# Patient Record
Sex: Male | Born: 1940 | Race: White | Hispanic: No | Smoking: Former smoker
Health system: Southern US, Community
[De-identification: ages and names within clinical notes are randomized; demographics above are authoritative.]

## PROBLEM LIST (undated history)

## (undated) DIAGNOSIS — I509 Heart failure, unspecified: Secondary | ICD-10-CM

## (undated) DIAGNOSIS — E785 Hyperlipidemia, unspecified: Secondary | ICD-10-CM

## (undated) DIAGNOSIS — E119 Type 2 diabetes mellitus without complications: Secondary | ICD-10-CM

## (undated) DIAGNOSIS — E538 Deficiency of other specified B group vitamins: Secondary | ICD-10-CM

## (undated) DIAGNOSIS — Z8546 Personal history of malignant neoplasm of prostate: Secondary | ICD-10-CM

## (undated) DIAGNOSIS — C9110 Chronic lymphocytic leukemia of B-cell type not having achieved remission: Secondary | ICD-10-CM

## (undated) DIAGNOSIS — D72829 Elevated white blood cell count, unspecified: Secondary | ICD-10-CM

## (undated) DIAGNOSIS — I10 Essential (primary) hypertension: Secondary | ICD-10-CM

## (undated) HISTORY — DX: Type 2 diabetes mellitus without complications: E11.9

## (undated) HISTORY — DX: Hyperlipidemia, unspecified: E78.5

## (undated) HISTORY — DX: Heart failure, unspecified: I50.9

## (undated) HISTORY — DX: Chronic lymphocytic leukemia of B-cell type not having achieved remission: C91.10

## (undated) HISTORY — DX: Elevated white blood cell count, unspecified: D72.829

## (undated) HISTORY — DX: Deficiency of other specified B group vitamins: E53.8

## (undated) HISTORY — DX: Personal history of malignant neoplasm of prostate: Z85.46

## (undated) HISTORY — PX: PROSTATE BIOPSY: SHX241

## (undated) HISTORY — DX: Essential (primary) hypertension: I10

## (undated) MED FILL — Cemiplimab-rwlc IV Soln 350 MG/7ML (50 MG/ML): INTRAVENOUS | Qty: 7 | Status: AC

---

## 1998-02-20 ENCOUNTER — Encounter: Payer: Self-pay | Admitting: Emergency Medicine

## 1998-02-20 ENCOUNTER — Inpatient Hospital Stay (HOSPITAL_COMMUNITY): Admission: EM | Admit: 1998-02-20 | Discharge: 1998-02-23 | Payer: Self-pay | Admitting: Emergency Medicine

## 1998-02-22 ENCOUNTER — Encounter: Payer: Self-pay | Admitting: Family Medicine

## 1998-02-24 ENCOUNTER — Encounter: Admission: RE | Admit: 1998-02-24 | Discharge: 1998-02-24 | Payer: Self-pay | Admitting: Family Medicine

## 1998-03-04 ENCOUNTER — Encounter: Admission: RE | Admit: 1998-03-04 | Discharge: 1998-06-02 | Payer: Self-pay | Admitting: *Deleted

## 2003-05-05 ENCOUNTER — Ambulatory Visit: Admission: RE | Admit: 2003-05-05 | Discharge: 2003-08-03 | Payer: Self-pay | Admitting: *Deleted

## 2003-06-09 ENCOUNTER — Encounter: Admission: RE | Admit: 2003-06-09 | Discharge: 2003-06-09 | Payer: Self-pay | Admitting: Urology

## 2003-06-24 ENCOUNTER — Encounter: Admission: RE | Admit: 2003-06-24 | Discharge: 2003-06-24 | Payer: Self-pay | Admitting: Urology

## 2003-07-01 ENCOUNTER — Ambulatory Visit (HOSPITAL_COMMUNITY): Admission: RE | Admit: 2003-07-01 | Discharge: 2003-07-01 | Payer: Self-pay | Admitting: Urology

## 2003-07-01 ENCOUNTER — Ambulatory Visit (HOSPITAL_BASED_OUTPATIENT_CLINIC_OR_DEPARTMENT_OTHER): Admission: RE | Admit: 2003-07-01 | Discharge: 2003-07-01 | Payer: Self-pay | Admitting: Urology

## 2005-06-11 IMAGING — CR DG CHEST 2V
2 series · 2 of 2 positions shown · non-contrast
Comparison: none

CLINICAL DATA: Carcinoma of the prostate, evaluate.
 TWO VIEW CHEST
 There are mildly accentuated bronchial markings consistent with mild probable chronic bronchitic change.  There are no infiltrative or edematous changes.  There is questionable small nodular shadow seen within the right lung base in the anterior right fifth/sixth interspace region.  Recommend PA chest with nipple markers for further evaluation.  There are no mediastinal abnormalities.  There are multiple left rib fractures.  The heart is normal in size.
 IMPRESSION
 1.  Question small nodular shadow, right lung base vs. nipple shadow.  Recommend PA chest with nipple markers. 
 2. Probable mild chronic bronchitic change.

[view not recorded (1 of 2)]
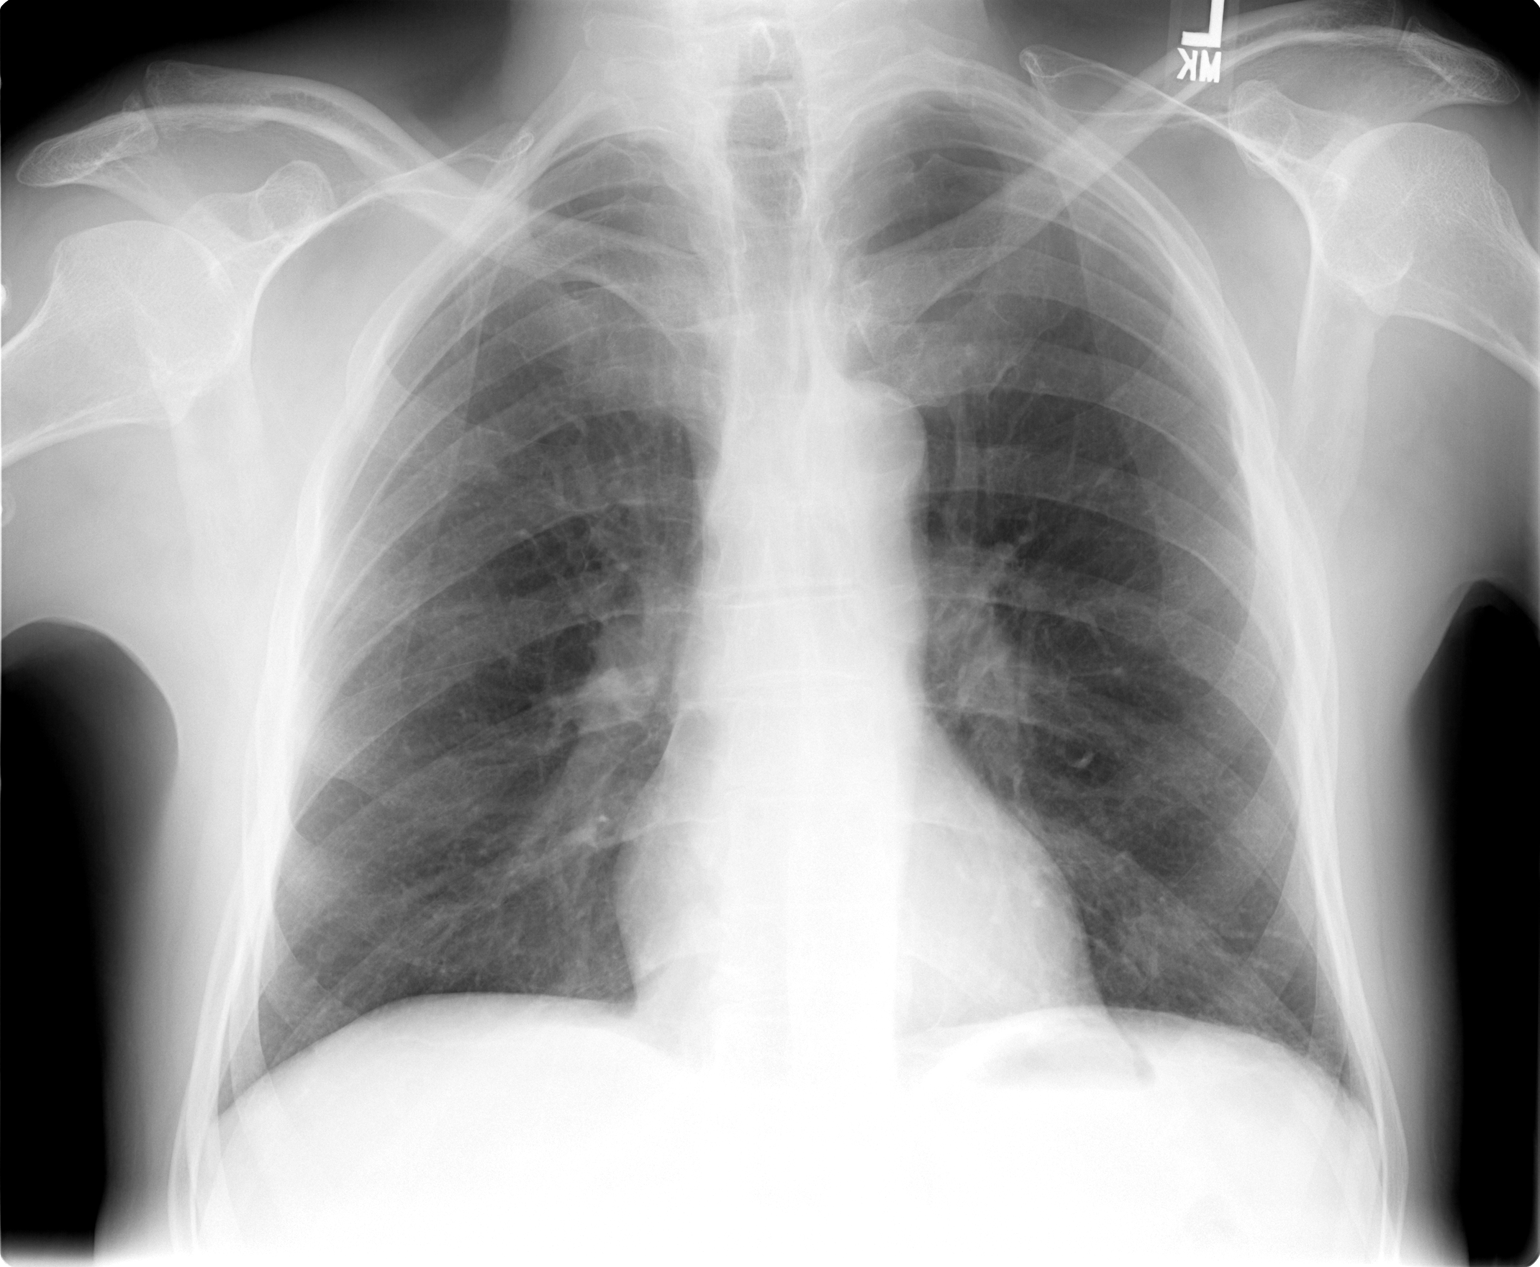

[view not recorded (2 of 2)]
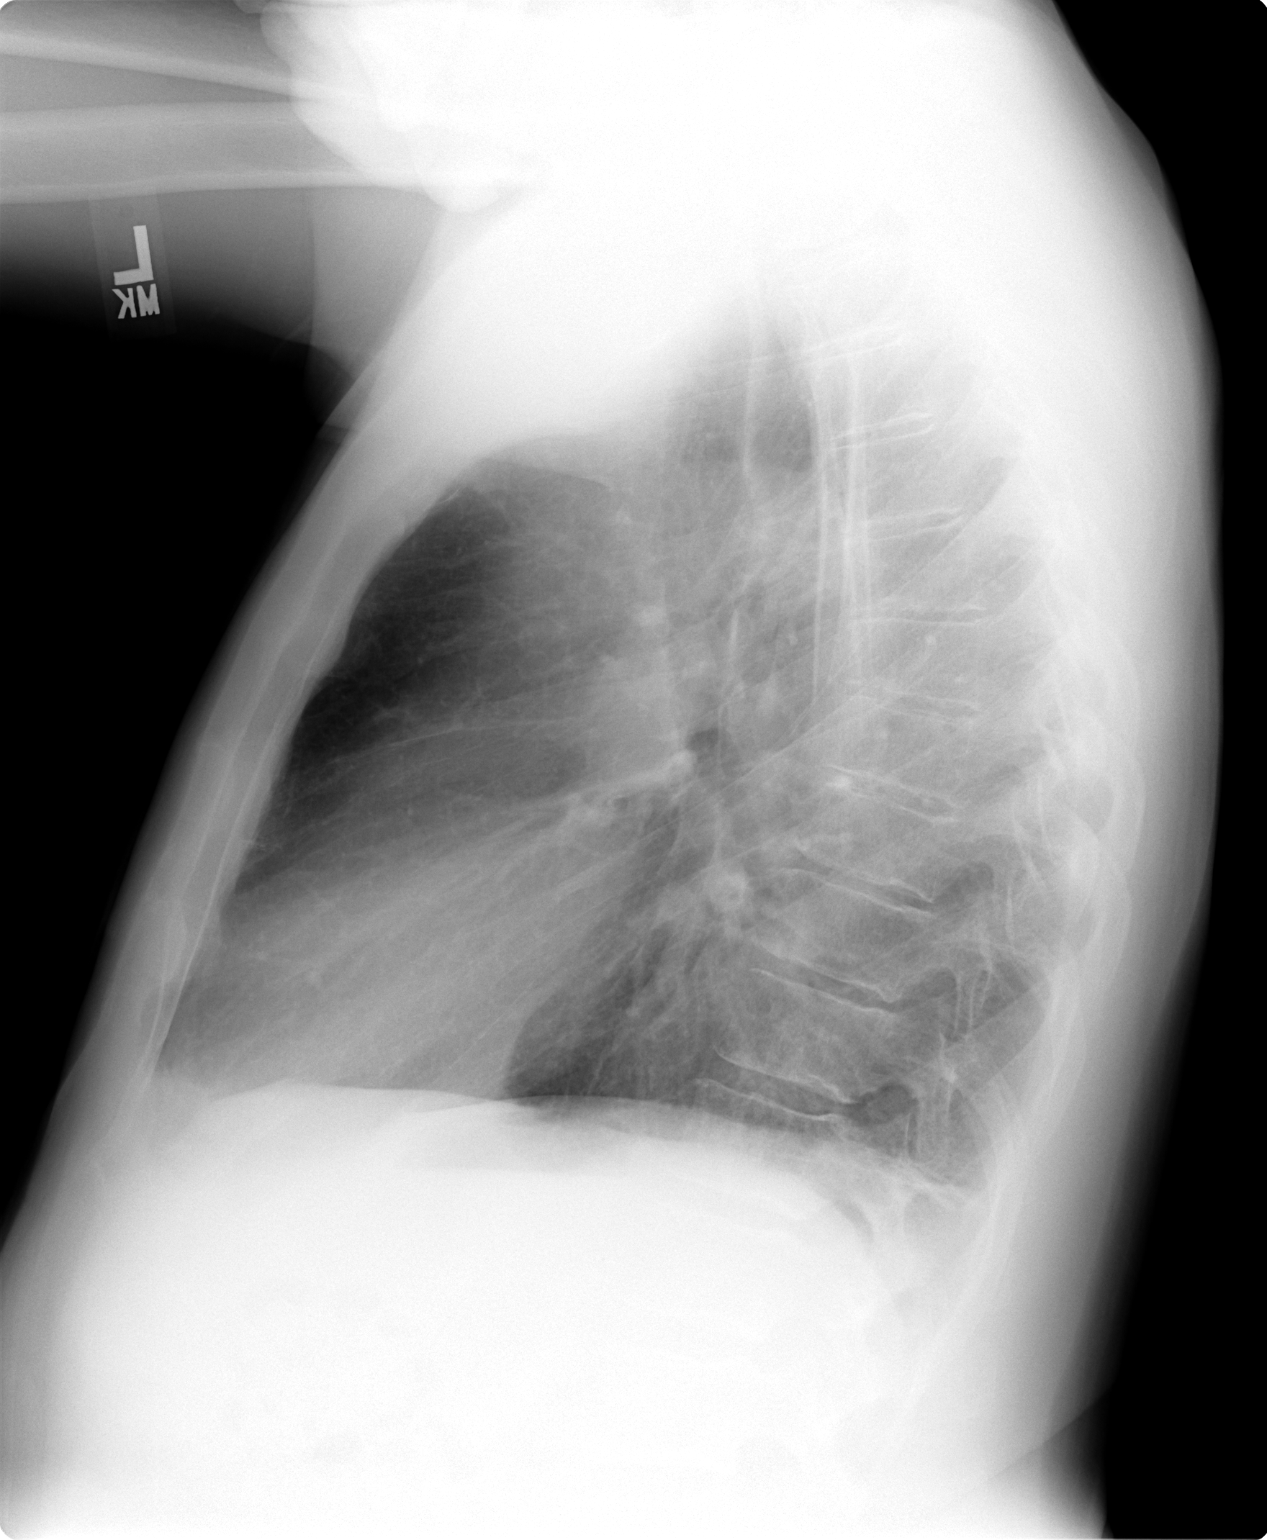

[2 of 2 positions shown; findings below may reference images not displayed]

## 2011-06-20 DIAGNOSIS — D126 Benign neoplasm of colon, unspecified: Secondary | ICD-10-CM | POA: Insufficient documentation

## 2012-12-27 DIAGNOSIS — I10 Essential (primary) hypertension: Secondary | ICD-10-CM | POA: Insufficient documentation

## 2012-12-27 DIAGNOSIS — Z86010 Personal history of colonic polyps: Secondary | ICD-10-CM | POA: Insufficient documentation

## 2012-12-27 DIAGNOSIS — K5730 Diverticulosis of large intestine without perforation or abscess without bleeding: Secondary | ICD-10-CM | POA: Insufficient documentation

## 2013-01-04 DIAGNOSIS — C61 Malignant neoplasm of prostate: Secondary | ICD-10-CM | POA: Insufficient documentation

## 2013-01-10 DIAGNOSIS — E785 Hyperlipidemia, unspecified: Secondary | ICD-10-CM | POA: Insufficient documentation

## 2013-01-10 DIAGNOSIS — E1169 Type 2 diabetes mellitus with other specified complication: Secondary | ICD-10-CM | POA: Insufficient documentation

## 2013-01-10 DIAGNOSIS — E538 Deficiency of other specified B group vitamins: Secondary | ICD-10-CM | POA: Insufficient documentation

## 2013-07-17 DIAGNOSIS — B351 Tinea unguium: Secondary | ICD-10-CM | POA: Insufficient documentation

## 2013-12-25 DIAGNOSIS — I509 Heart failure, unspecified: Secondary | ICD-10-CM | POA: Insufficient documentation

## 2014-01-24 DIAGNOSIS — I471 Supraventricular tachycardia: Secondary | ICD-10-CM | POA: Insufficient documentation

## 2014-01-24 DIAGNOSIS — I493 Ventricular premature depolarization: Secondary | ICD-10-CM | POA: Insufficient documentation

## 2014-04-30 DIAGNOSIS — G2581 Restless legs syndrome: Secondary | ICD-10-CM | POA: Insufficient documentation

## 2015-03-20 DIAGNOSIS — I779 Disorder of arteries and arterioles, unspecified: Secondary | ICD-10-CM | POA: Insufficient documentation

## 2015-05-25 ENCOUNTER — Other Ambulatory Visit (HOSPITAL_COMMUNITY)
Admission: RE | Admit: 2015-05-25 | Disposition: A | Discharge status: Home / Self Care | Payer: Self-pay | Source: Ambulatory Visit | Attending: Oncology | Admitting: Oncology

## 2015-05-25 DIAGNOSIS — D649 Anemia, unspecified: Secondary | ICD-10-CM | POA: Diagnosis not present

## 2015-05-25 DIAGNOSIS — Z8546 Personal history of malignant neoplasm of prostate: Secondary | ICD-10-CM | POA: Diagnosis not present

## 2015-05-25 DIAGNOSIS — C9110 Chronic lymphocytic leukemia of B-cell type not having achieved remission: Secondary | ICD-10-CM | POA: Insufficient documentation

## 2015-07-20 DIAGNOSIS — Z9181 History of falling: Secondary | ICD-10-CM | POA: Insufficient documentation

## 2015-11-23 DIAGNOSIS — C9110 Chronic lymphocytic leukemia of B-cell type not having achieved remission: Secondary | ICD-10-CM | POA: Diagnosis not present

## 2015-12-24 DIAGNOSIS — Z85828 Personal history of other malignant neoplasm of skin: Secondary | ICD-10-CM | POA: Insufficient documentation

## 2016-12-21 DIAGNOSIS — D849 Immunodeficiency, unspecified: Secondary | ICD-10-CM | POA: Diagnosis not present

## 2016-12-21 DIAGNOSIS — C9110 Chronic lymphocytic leukemia of B-cell type not having achieved remission: Secondary | ICD-10-CM | POA: Diagnosis not present

## 2016-12-21 DIAGNOSIS — B370 Candidal stomatitis: Secondary | ICD-10-CM | POA: Diagnosis not present

## 2016-12-28 DIAGNOSIS — C44310 Basal cell carcinoma of skin of unspecified parts of face: Secondary | ICD-10-CM | POA: Insufficient documentation

## 2017-12-05 DIAGNOSIS — C9110 Chronic lymphocytic leukemia of B-cell type not having achieved remission: Secondary | ICD-10-CM

## 2018-05-17 DIAGNOSIS — M5440 Lumbago with sciatica, unspecified side: Secondary | ICD-10-CM | POA: Insufficient documentation

## 2018-06-07 DIAGNOSIS — C9110 Chronic lymphocytic leukemia of B-cell type not having achieved remission: Secondary | ICD-10-CM | POA: Diagnosis not present

## 2019-01-03 DIAGNOSIS — C9110 Chronic lymphocytic leukemia of B-cell type not having achieved remission: Secondary | ICD-10-CM | POA: Diagnosis not present

## 2019-01-03 DIAGNOSIS — D72829 Elevated white blood cell count, unspecified: Secondary | ICD-10-CM | POA: Diagnosis not present

## 2019-03-11 DIAGNOSIS — C9110 Chronic lymphocytic leukemia of B-cell type not having achieved remission: Secondary | ICD-10-CM

## 2019-05-16 DIAGNOSIS — D7289 Other specified disorders of white blood cells: Secondary | ICD-10-CM

## 2019-08-12 DIAGNOSIS — I480 Paroxysmal atrial fibrillation: Secondary | ICD-10-CM | POA: Insufficient documentation

## 2019-08-14 DIAGNOSIS — C9110 Chronic lymphocytic leukemia of B-cell type not having achieved remission: Secondary | ICD-10-CM | POA: Diagnosis not present

## 2019-08-24 DIAGNOSIS — K56609 Unspecified intestinal obstruction, unspecified as to partial versus complete obstruction: Secondary | ICD-10-CM | POA: Insufficient documentation

## 2019-08-24 DIAGNOSIS — K5000 Crohn's disease of small intestine without complications: Secondary | ICD-10-CM | POA: Insufficient documentation

## 2019-08-28 DIAGNOSIS — R5381 Other malaise: Secondary | ICD-10-CM | POA: Insufficient documentation

## 2019-10-18 DIAGNOSIS — D72829 Elevated white blood cell count, unspecified: Secondary | ICD-10-CM

## 2020-02-13 DIAGNOSIS — C9110 Chronic lymphocytic leukemia of B-cell type not having achieved remission: Secondary | ICD-10-CM | POA: Diagnosis not present

## 2020-02-13 DIAGNOSIS — D72829 Elevated white blood cell count, unspecified: Secondary | ICD-10-CM | POA: Diagnosis not present

## 2020-02-20 ENCOUNTER — Encounter: Payer: Self-pay | Admitting: Oncology

## 2020-02-20 ENCOUNTER — Other Ambulatory Visit: Payer: Self-pay | Admitting: Oncology

## 2020-02-20 DIAGNOSIS — D509 Iron deficiency anemia, unspecified: Secondary | ICD-10-CM

## 2020-02-20 HISTORY — DX: Iron deficiency anemia, unspecified: D50.9

## 2020-02-21 ENCOUNTER — Encounter: Payer: Self-pay | Admitting: Pharmacist

## 2020-02-23 ENCOUNTER — Other Ambulatory Visit: Payer: Self-pay | Admitting: Oncology

## 2020-03-16 DIAGNOSIS — E039 Hypothyroidism, unspecified: Secondary | ICD-10-CM | POA: Insufficient documentation

## 2020-03-24 ENCOUNTER — Other Ambulatory Visit: Payer: Self-pay | Admitting: Hematology and Oncology

## 2020-03-24 ENCOUNTER — Telehealth: Payer: Self-pay | Admitting: Oncology

## 2020-03-24 DIAGNOSIS — C9110 Chronic lymphocytic leukemia of B-cell type not having achieved remission: Secondary | ICD-10-CM

## 2020-03-24 MED FILL — Ferumoxytol Inj 510 MG/17ML (30 MG/ML) (Elemental Fe): INTRAVENOUS | Qty: 17 | Status: AC

## 2020-03-24 NOTE — Telephone Encounter (Signed)
Patient's daughter Reschedule 11/29 Infusion to 12/2 due to patient having another appointment

## 2020-03-25 ENCOUNTER — Inpatient Hospital Stay: Payer: Medicare Other

## 2020-03-25 ENCOUNTER — Telehealth: Payer: Self-pay

## 2020-03-30 ENCOUNTER — Ambulatory Visit: Payer: Medicare Other

## 2020-04-02 ENCOUNTER — Inpatient Hospital Stay: Payer: Medicare Other | Attending: Oncology

## 2020-04-02 ENCOUNTER — Other Ambulatory Visit: Payer: Self-pay

## 2020-04-02 VITALS — BP 128/79 | HR 65 | Temp 97.8°F | Resp 18 | Ht 70.5 in | Wt 166.2 lb

## 2020-04-02 DIAGNOSIS — D509 Iron deficiency anemia, unspecified: Secondary | ICD-10-CM | POA: Insufficient documentation

## 2020-04-02 MED ORDER — SODIUM CHLORIDE 0.9 % IV SOLN
Freq: Once | INTRAVENOUS | Status: AC
  Filled 2020-04-02: qty 250

## 2020-04-02 MED ORDER — SODIUM CHLORIDE 0.9 % IV SOLN
510.0000 mg | Freq: Once | INTRAVENOUS | Status: AC
  Administered 2020-04-02: 510 mg via INTRAVENOUS
  Filled 2020-04-02: qty 510

## 2020-04-02 NOTE — Progress Notes (Signed)
1414:PT STABLE AT TIME OF DISCHARGE °

## 2020-04-02 NOTE — Patient Instructions (Signed)

## 2020-04-07 ENCOUNTER — Other Ambulatory Visit: Payer: Self-pay | Admitting: Pharmacist

## 2020-04-08 ENCOUNTER — Ambulatory Visit: Payer: Medicare Other

## 2020-04-09 ENCOUNTER — Other Ambulatory Visit: Payer: Self-pay

## 2020-04-09 ENCOUNTER — Other Ambulatory Visit: Payer: Medicare Other

## 2020-04-09 ENCOUNTER — Inpatient Hospital Stay: Payer: Medicare Other

## 2020-04-09 ENCOUNTER — Ambulatory Visit: Payer: Medicare Other | Admitting: Oncology

## 2020-04-09 VITALS — BP 133/68 | HR 58 | Temp 97.7°F | Resp 18 | Ht 70.5 in | Wt 166.2 lb

## 2020-04-09 DIAGNOSIS — D509 Iron deficiency anemia, unspecified: Secondary | ICD-10-CM | POA: Diagnosis not present

## 2020-04-09 MED ORDER — SODIUM CHLORIDE 0.9 % IV SOLN
510.0000 mg | Freq: Once | INTRAVENOUS | Status: AC
  Administered 2020-04-09: 510 mg via INTRAVENOUS
  Filled 2020-04-09: qty 510

## 2020-04-09 MED ORDER — SODIUM CHLORIDE 0.9 % IV SOLN
Freq: Once | INTRAVENOUS | Status: AC
  Filled 2020-04-09: qty 250

## 2020-04-09 NOTE — Progress Notes (Signed)
PT STABLE AT TIME OF DISCHARGE 

## 2020-04-09 NOTE — Patient Instructions (Signed)

## 2020-04-10 ENCOUNTER — Other Ambulatory Visit: Payer: Self-pay | Admitting: Hematology and Oncology

## 2020-04-10 DIAGNOSIS — C9110 Chronic lymphocytic leukemia of B-cell type not having achieved remission: Secondary | ICD-10-CM

## 2020-04-16 ENCOUNTER — Other Ambulatory Visit: Payer: Medicare Other

## 2020-04-16 ENCOUNTER — Ambulatory Visit: Payer: Medicare Other | Admitting: Oncology

## 2020-05-02 DEATH — deceased

## 2020-05-04 NOTE — Progress Notes (Signed)
[facility]  [address] [place],  [place]  [address] [phone]  Clinic Day:  05/05/2020  Referring physician: [name], MD   This document serves as a record of services personally performed by Gery Pray, MD. It was created on their behalf by [name],[name], a trained medical scribe. The creation of this record is based on the scribe's personal observations and the provider's statements to them.  CHIEF COMPLAINT:  CC: Chronic lymphocytic leukemia  Current Treatment:  Surveillance   HISTORY OF PRESENT ILLNESS:  Fernando Young is a 80 y.o. 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  The CLL was confirmed by flow cytometry in July 2017.  He has had slow progression of his lymphocytosis over time, with mild anemia, but has remained on observation.  He has multiple medical comorbidities including diabetes, congestive heart failure, B12 deficiency, cardiac arrhythmias, and history of prostate cancer.  The white blood cell count increased significantly from 36,000 to 57,000 from February to August 2019, but since has been increasing more slowly.  His white count had increased to 77.5 in June of 2020, with 82% lymphocytes.  He states he had a colonoscopy in [place] last year.  He was seen in consultation at [facility] in August of 2020, and they recommend treatment in view of his anemia and more rapid rise in his lymphocytosis.  His white blood cell count was up to 88,000 with 79% lymphocytes, for an ALC of 70,000, hemoglobin 11 and platelet count of 199,000.  He and his daughter discussed that with me at our recent appointment, and we reviewed the risks and benefits, and have decided to wait a little longer. He underwent MOHS Surgery of his right face for a skin cancer. He was diagnosed with atrial fibrillation, and was scheduled with Dr. Lucianne Muss for cardioversion.  He  continues oral iron supplement and B12. At his last last visit in October he was found to have iron deficiency, but B12 and folate levels were normal.  He was given 2 doses of IV Feraheme.    INTERVAL HISTORY:  Fernando Young is here for routine follow up and states that he has been well.  At his last visit, he was found to be iron deficient, and he received two treatments of IV iron in October 2021.  He does continue oral iron supplement, but I doubt that he is absorbing the supplement, so he may discontinue.  He does note some fatigue.  He is scheduled with dermatology in the near future for his multiple skin lesions.  He has been placed on levothyroxine 50 mcg daily.  His white count has improved from 116.2 to 101.6 with an ALC of 91.44, improved from 111.55.  Hemoglobin has improved from 9.3 to 11.2, and his platelet count is normal.  Chemistries are remarkable for a potassium of 5.8, a BUN of 21, and a total protein of 5.9.  Non-fasting blood glucose is 163.  His  appetite is good, and he has lost 1 and 1/2 pounds since his last visit.  He denies fever, chills or other signs of infection.  He denies nausea, vomiting, bowel issues, or abdominal pain.  He denies sore throat, cough, dyspnea, or chest pain.  REVIEW OF SYSTEMS:  Review of Systems  Constitutional: Positive for fatigue (improved).  HENT:  Negative.   Eyes: Negative.   Respiratory: Negative.   Cardiovascular: Negative.   Gastrointestinal:  Negative.   Endocrine: Negative.   Genitourinary: Negative.    Musculoskeletal: Negative.   Skin: Negative.   Neurological: Negative.   Hematological: Negative.   Psychiatric/Behavioral: Negative.   All other systems reviewed and are negative.    VITALS:  Blood pressure (!) 146/69, pulse 65, temperature (!) 97.4 F (36.3 C), temperature source Oral, resp. rate 18, height 5' 10.5" (1.791 m), weight 164 lb 9.6 oz (74.7 kg), SpO2 97 %.  Wt Readings from Last 3 Encounters:  05/05/20 164 lb 9.6 oz (74.7  kg)  04/09/20 166 lb 4 oz (75.4 kg)  04/02/20 166 lb 4 oz (75.4 kg)    Body mass index is 23.28 kg/m.  Performance status (ECOG): 1 - Symptomatic but completely ambulatory  PHYSICAL EXAM:  Physical Exam Constitutional:      General: He is not in acute distress.    Appearance: Normal appearance. He is normal weight.  HENT:     Head: Normocephalic and atraumatic.  Eyes:     General: No scleral icterus.    Extraocular Movements: Extraocular movements intact.     Conjunctiva/sclera: Conjunctivae normal.     Pupils: Pupils are equal, round, and reactive to light.     Comments: Ectropion of the right eye.  Cardiovascular:     Rate and Rhythm: Normal rate and regular rhythm.     Pulses: Normal pulses.     Heart sounds: Normal heart sounds. No murmur heard. No friction rub. No gallop.   Pulmonary:     Effort: Pulmonary effort is normal. No respiratory distress.     Breath sounds: Normal breath sounds.  Abdominal:     General: Bowel sounds are normal. There is no distension.     Palpations: Abdomen is soft. There is no mass.     Tenderness: There is no abdominal tenderness.  Musculoskeletal:        General: Normal range of motion.     Cervical back: Normal range of motion and neck supple.     Right lower leg: Edema (trace) present.     Left lower leg: Edema (trace) present.  Lymphadenopathy:     Cervical: No cervical adenopathy.  Skin:    General: Skin is warm and dry.     Comments: Lesion at the temporal mandibular joint.  Growth of the right posterior neck, consistent with skin tag.  Multiple keratoses with a larger lesion of the left cheek and of the nose.    Neurological:     General: No focal deficit present.     Mental Status: He is alert and oriented to person, place, and time. Mental status is at baseline.  Psychiatric:        Mood and Affect: Mood normal.        Behavior: Behavior normal.        Thought Content: Thought content normal.        Judgment: Judgment  normal.     LABS:  No flowsheet data found. No flowsheet data found.   No results found for: LDH   STUDIES:  No results found.   Allergies: No Known Allergies  Current Medications: Current Outpatient Medications  Medication Sig Dispense Refill  . amiodarone (PACERONE) 200 MG tablet Take 1 tablet by mouth daily.    Marland Kitchen albuterol (VENTOLIN HFA) 108 (90 Base) MCG/ACT inhaler Inhale 2 puffs into the lungs every 4 (four) hours as needed for wheezing or shortness of breath.    . Apixaban (ELIQUIS PO) Take by mouth daily.    Marland Kitchen  Ascorbic Acid (VITAMIN C) 500 MG CAPS Take 500 mg by mouth daily.    Marland Kitchen azelastine (ASTELIN) 0.1 % nasal spray Place into both nostrils 2 (two) times daily. Use in each nostril as directed    . benazepril (LOTENSIN) 20 MG tablet Take 20 mg by mouth daily.    Marland Kitchen doxycycline (DORYX) 100 MG EC tablet Take 100 mg by mouth 2 (two) times daily.    . ferrous sulfate 325 (65 FE) MG tablet Take by mouth.    . fluorouracil (EFUDEX) 5 % cream Apply topically 2 (two) times daily.    . hydrochlorothiazide (MICROZIDE) 12.5 MG capsule Take 12.5 mg by mouth daily.    Marland Kitchen levothyroxine (SYNTHROID) 50 MCG tablet Take 50 mcg by mouth daily.    . metFORMIN (GLUCOPHAGE) 500 MG tablet Take by mouth 2 (two) times daily with a meal.    . metoprolol succinate (TOPROL-XL) 25 MG 24 hr tablet Take 25 mg by mouth daily.    . minocycline (MINOCIN) 100 MG capsule Take 100 mg by mouth 2 (two) times daily.    . Omega-3 Fatty Acids (FISH OIL) 1000 MG CAPS Take 2,000 mg by mouth daily.    Marland Kitchen omeprazole (PRILOSEC OTC) 20 MG tablet Take 20 mg by mouth daily.    Marland Kitchen rOPINIRole (REQUIP) 0.5 MG tablet Take 0.5 mg by mouth at bedtime. Take 1-2 before sleep    . rosuvastatin (CRESTOR) 10 MG tablet Take 10 mg by mouth daily.    . tamsulosin (FLOMAX) 0.4 MG CAPS capsule Take 0.4 mg by mouth at bedtime.    . traMADol (ULTRAM) 50 MG tablet SMARTSIG:1 By Mouth 4-5 Times Daily    . vitamin B-12 (CYANOCOBALAMIN) 500  MCG tablet Take 500 mcg by mouth daily.     No current facility-administered medications for this visit.     ASSESSMENT & PLAN:   Assessment:   1.  Chronic lymphocytic leukemia, diagnosed in July 2017, which is slowly progressive.  As he is fairly stable, it is not urgent that he start treatment at this time.  I would recommend acalabrutinib if he needs to start treatment.   2.  Anemia, much improved.  He was found to be iron deficiency in October 2021, and received 2 doses of IV iron.  He had been on oral supplement for years, and so I doubt he is absorbing this and may discontinue.    3.  Hyperkalemia.  I have given them a note to take to Dr. Heber [place] so that a level can be repeated next week.  Plan: His white count is improved at 101.6 with and ALC of 91.44, so we will hold off on treatment.  We will see him back in 3 months with CBC and CMP.  I gave them a note to take to Dr. Heber [place] to repeat a potassium level next week.  The patient and his daughter understand the plans discussed today and are in agreement with them.  He knows to contact our office if he develops concerns regarding his CLL.    I provided 20 minutes of face-to-face time during this this encounter and > 50% was spent counseling as documented under my assessment and plan.    Derwood Kaplan, MD [place] [address] [place] [address] Dept: [phone] Dept Fax: [phone]   I, Rita Ohara, am acting as scribe for Derwood Kaplan, MD  I have reviewed this report as typed  by the medical scribe, and it is complete and accurate.

## 2020-05-05 ENCOUNTER — Other Ambulatory Visit: Payer: Self-pay

## 2020-05-05 ENCOUNTER — Telehealth: Payer: Self-pay | Admitting: Oncology

## 2020-05-05 ENCOUNTER — Encounter: Payer: Self-pay | Admitting: Oncology

## 2020-05-05 ENCOUNTER — Inpatient Hospital Stay: Payer: Medicare Other | Attending: Oncology

## 2020-05-05 ENCOUNTER — Other Ambulatory Visit: Payer: Self-pay | Admitting: Hematology and Oncology

## 2020-05-05 ENCOUNTER — Inpatient Hospital Stay (INDEPENDENT_AMBULATORY_CARE_PROVIDER_SITE_OTHER): Payer: Medicare Other | Admitting: Oncology

## 2020-05-05 VITALS — BP 146/69 | HR 65 | Temp 97.4°F | Resp 18 | Ht 70.5 in | Wt 164.6 lb

## 2020-05-05 DIAGNOSIS — C9110 Chronic lymphocytic leukemia of B-cell type not having achieved remission: Secondary | ICD-10-CM | POA: Insufficient documentation

## 2020-05-05 LAB — RETICULOCYTES
Immature Retic Fract: 16.5 % — ABNORMAL HIGH (ref 2.3–15.9)
RBC.: 4.12 MIL/uL — ABNORMAL LOW (ref 4.22–5.81)
Retic Count, Absolute: 57.3 10*3/uL (ref 19.0–186.0)
Retic Ct Pct: 1.4 % (ref 0.4–3.1)

## 2020-05-05 LAB — HEPATIC FUNCTION PANEL
ALT: 42 — AB (ref 10–40)
AST: 58 — AB (ref 14–40)
Alkaline Phosphatase: 59 (ref 25–125)
Bilirubin, Total: 0.3

## 2020-05-05 LAB — BASIC METABOLIC PANEL
BUN: 21 (ref 4–21)
CO2: 25 — AB (ref 13–22)
Chloride: 108 (ref 99–108)
Creatinine: 0.9 (ref 0.6–1.3)
Glucose: 163
Potassium: 5.8 — AB (ref 3.4–5.3)
Sodium: 138 (ref 137–147)

## 2020-05-05 LAB — COMPREHENSIVE METABOLIC PANEL
Albumin: 3.3 — AB (ref 3.5–5.0)
Calcium: 8.4 — AB (ref 8.7–10.7)

## 2020-05-05 LAB — CBC AND DIFFERENTIAL
HCT: 37 — AB (ref 41–53)
Hemoglobin: 11.2 — AB (ref 13.5–17.5)
Neutrophils Absolute: 8.13
Platelets: 185 (ref 150–399)
WBC: 101.6

## 2020-05-05 LAB — CBC: RBC: 4.14 (ref 3.87–5.11)

## 2020-05-05 NOTE — Telephone Encounter (Signed)
Per 1/4 los next appt sched and given to patient

## 2020-05-06 LAB — HAPTOGLOBIN: Haptoglobin: 265 mg/dL (ref 38–329)

## 2020-05-06 LAB — MYCOPLASMA PNEUMONIAE ANTIBODY, IGG: M Pneumoniae IgG Abs: 160 U/mL — ABNORMAL HIGH (ref 0–99)

## 2020-05-06 LAB — MYCOPLASMA PNEUMONIAE ANTIBODY, IGM: Mycoplasma pneumo IgM: <770 U/mL (ref 0–769)

## 2020-05-06 LAB — COLD AGGLUTININ TITER: Cold Agglutinin Titer: NEGATIVE

## 2020-05-18 ENCOUNTER — Telehealth: Payer: Self-pay

## 2020-05-18 NOTE — Telephone Encounter (Signed)
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  No cold agglutinins now, but could have been present when he had the infection.  

## 2020-05-18 NOTE — Telephone Encounter (Signed)
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  No cold agglutinins now, but could have been present when he had the infection.

## 2020-05-18 NOTE — Telephone Encounter (Signed)
[name] the daughter called and make aware.

## 2020-05-25 DIAGNOSIS — U071 COVID-19: Secondary | ICD-10-CM | POA: Insufficient documentation

## 2020-08-03 ENCOUNTER — Other Ambulatory Visit: Payer: Self-pay

## 2020-08-03 ENCOUNTER — Inpatient Hospital Stay: Payer: Medicare Other

## 2020-08-03 ENCOUNTER — Telehealth: Payer: Self-pay

## 2020-08-03 ENCOUNTER — Other Ambulatory Visit: Payer: Self-pay | Admitting: Hematology and Oncology

## 2020-08-03 ENCOUNTER — Inpatient Hospital Stay: Payer: Medicare Other | Attending: Oncology | Admitting: Hematology and Oncology

## 2020-08-03 VITALS — BP 118/64 | HR 63 | Temp 97.7°F | Resp 18 | Ht 70.5 in | Wt 157.2 lb

## 2020-08-03 DIAGNOSIS — R2689 Other abnormalities of gait and mobility: Secondary | ICD-10-CM | POA: Diagnosis not present

## 2020-08-03 DIAGNOSIS — E119 Type 2 diabetes mellitus without complications: Secondary | ICD-10-CM | POA: Insufficient documentation

## 2020-08-03 DIAGNOSIS — C9110 Chronic lymphocytic leukemia of B-cell type not having achieved remission: Secondary | ICD-10-CM | POA: Diagnosis not present

## 2020-08-03 DIAGNOSIS — Z9181 History of falling: Secondary | ICD-10-CM | POA: Diagnosis not present

## 2020-08-03 DIAGNOSIS — E785 Hyperlipidemia, unspecified: Secondary | ICD-10-CM | POA: Diagnosis not present

## 2020-08-03 DIAGNOSIS — I509 Heart failure, unspecified: Secondary | ICD-10-CM | POA: Insufficient documentation

## 2020-08-03 DIAGNOSIS — I4891 Unspecified atrial fibrillation: Secondary | ICD-10-CM | POA: Diagnosis not present

## 2020-08-03 DIAGNOSIS — Z79899 Other long term (current) drug therapy: Secondary | ICD-10-CM | POA: Insufficient documentation

## 2020-08-03 DIAGNOSIS — E860 Dehydration: Secondary | ICD-10-CM | POA: Insufficient documentation

## 2020-08-03 DIAGNOSIS — I110 Hypertensive heart disease with heart failure: Secondary | ICD-10-CM | POA: Diagnosis not present

## 2020-08-03 DIAGNOSIS — Z8546 Personal history of malignant neoplasm of prostate: Secondary | ICD-10-CM | POA: Diagnosis not present

## 2020-08-03 DIAGNOSIS — E538 Deficiency of other specified B group vitamins: Secondary | ICD-10-CM | POA: Diagnosis not present

## 2020-08-03 DIAGNOSIS — Z7901 Long term (current) use of anticoagulants: Secondary | ICD-10-CM | POA: Insufficient documentation

## 2020-08-03 DIAGNOSIS — Z7984 Long term (current) use of oral hypoglycemic drugs: Secondary | ICD-10-CM | POA: Diagnosis not present

## 2020-08-03 LAB — BASIC METABOLIC PANEL
BUN: 30 — AB (ref 4–21)
CO2: 27 — AB (ref 13–22)
Chloride: 106 (ref 99–108)
Creatinine: 0.9 (ref 0.6–1.3)
Glucose: 128
Potassium: 4.4 (ref 3.4–5.3)
Potassium: 6.9 — AB (ref 3.4–5.3)
Sodium: 137 (ref 137–147)

## 2020-08-03 LAB — CBC AND DIFFERENTIAL
HCT: 40 — AB (ref 41–53)
Hemoglobin: 11.9 — AB (ref 13.5–17.5)
Neutrophils Absolute: 9.73
Platelets: 233 (ref 150–399)
WBC: 108.1

## 2020-08-03 LAB — LACTATE DEHYDROGENASE: LDH: 166 U/L (ref 98–192)

## 2020-08-03 LAB — HEPATIC FUNCTION PANEL
ALT: 47 — AB (ref 10–40)
AST: 60 — AB (ref 14–40)
Alkaline Phosphatase: 73 (ref 25–125)
Bilirubin, Total: 0.4

## 2020-08-03 LAB — RETICULOCYTES
Immature Retic Fract: 12.8 % (ref 2.3–15.9)
RBC.: 4.15 MIL/uL — ABNORMAL LOW (ref 4.22–5.81)
Retic Count, Absolute: 49.4 10*3/uL (ref 19.0–186.0)
Retic Ct Pct: 1.2 % (ref 0.4–3.1)

## 2020-08-03 LAB — CBC
Absolute Lymphocytes: 95.13 — AB (ref 0.65–4.75)
Absolute Lymphocytes: 95.13 — AB (ref 0.65–4.75)
MCV: 92 (ref 80–94)
MCV: 92 (ref 80–94)
RBC: 4.34 (ref 3.87–5.11)

## 2020-08-03 LAB — COMPREHENSIVE METABOLIC PANEL
Albumin: 3.8 (ref 3.5–5.0)
Calcium: 8.3 — AB (ref 8.7–10.7)

## 2020-08-03 MED ORDER — SODIUM CHLORIDE 0.9 % IV SOLN
Freq: Once | INTRAVENOUS | Status: AC
  Filled 2020-08-03: qty 250

## 2020-08-03 MED ORDER — SODIUM CHLORIDE 0.9% FLUSH
10.0000 mL | Freq: Once | INTRAVENOUS | Status: AC | PRN
  Administered 2020-08-03: 10 mL
  Filled 2020-08-03: qty 10

## 2020-08-03 NOTE — Progress Notes (Signed)
[facility]  [address] [place],  [place]  [address] [phone]  Clinic Day:  08/03/2020  Referring physician: Raelene Bott, MD   CHIEF COMPLAINT:  CC:  Chronic lymphocytic leukemia  Current Treatment:   Observation    HISTORY OF PRESENT ILLNESS:  Fernando Young is a 80 y.o. 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  The CLL was confirmed by flow cytometry in July 2017.  He has had slow progression of his lymphocytosis over time, with mild anemia, but has remained on observation.  He has multiple medical comorbidities including diabetes, congestive heart failure, B12 deficiency, cardiac arrhythmias, and history of prostate cancer. The white blood cell count has steadily increased over the years. The white blood cell count has been running between 100,000 and 116,000 for the last year.  Has been relatively asymptomatic. He was diagnosed with atrial fibrillation, and was scheduled with Dr. Dwyane Dee for cardioversion.   He had been taking oral iron and B12 supplements.  He has undergone multiple skin cancer surgeries. We have had multiple discussions with the patient and his daughter regarding the risk and benefits of treatment and had decided to wait.   In October 2021, he was found to have iron deficiency despite oral iron supplementation. B12 and folate levels were normal.  He was given 2 doses of IV Feraheme. Oral iron was discontinued as we did not feel he was absorbing it. Oral B12 was continued.  His hemoglobin has improved with the IV iron.  INTERVAL HISTORY:  Fernando Young is here today for repeat clinical assessent. He denies fevers, chills or night sweats. He is fatigued. He denies pain. His appetite is good and he states he is eating well.  He does not drink plenty of fluids, mainly diet [facility], which I explained contains potassium. His weight has decreased 8 pounds over last 3  months.  He reports diarrhea in the mornings 1-2 times.  He is using Pepto-Bismol as needed. He has chronic gait instability and has been having falls, but will not use a cane or walker.  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  His daughter accompanies him today and asked if the dermatologist had telephoned [place] about starting treatment for his CLL, as she was concerned some of his skin cancers may be related. She also states he has been treated for recent cellulitis by Dr. Heber [place].  REVIEW OF SYSTEMS:  Review of Systems  Constitutional: Negative for appetite change, chills, fatigue, fever and unexpected weight change.  HENT:   Negative for lump/mass, mouth sores and sore throat.   Respiratory: Negative for cough and shortness of breath.   Cardiovascular: Negative for chest pain and leg swelling.  Gastrointestinal: Negative for abdominal pain, constipation, diarrhea, nausea and vomiting.  Genitourinary: Negative for difficulty urinating, dysuria, frequency and hematuria.   Musculoskeletal: Negative for arthralgias, back pain and myalgias.  Skin: Negative for itching, rash and wound.  Neurological: Negative for dizziness, extremity weakness, headaches, light-headedness and numbness.  Hematological: Negative for adenopathy.  Psychiatric/Behavioral: Negative for depression and sleep disturbance. The patient is not nervous/anxious.      VITALS:  Blood pressure 105/64, pulse 70, temperature 97.7 F (36.5 C), temperature source Oral, resp. rate 18, height 5' 10.5" (1.791 m), weight 157 lb 3.2 oz (71.3 kg), SpO2 96 %.  Wt Readings from Last 3 Encounters:  08/03/20 157 lb 3.2 oz (71.3 kg)  05/05/20 164 lb 9.6  oz (74.7 kg)  04/09/20 166 lb 4 oz (75.4 kg)    Body mass index is 22.24 kg/m.  Performance status (ECOG): 1 - Symptomatic but completely ambulatory  PHYSICAL EXAM:  Physical Exam Vitals and nursing note reviewed.  Constitutional:      General: He is not in acute distress.    Appearance:  Normal appearance. He is normal weight.  HENT:     Head: Normocephalic and atraumatic.     Mouth/Throat:     Mouth: Mucous membranes are moist.     Pharynx: Oropharynx is clear. No oropharyngeal exudate or posterior oropharyngeal erythema.  Eyes:     General: No scleral icterus.    Extraocular Movements: Extraocular movements intact.     Conjunctiva/sclera: Conjunctivae normal.     Pupils: Pupils are equal, round, and reactive to light.  Cardiovascular:     Rate and Rhythm: Normal rate and regular rhythm.     Heart sounds: Normal heart sounds. No murmur heard. No friction rub. No gallop.   Pulmonary:     Effort: Pulmonary effort is normal.     Breath sounds: Normal breath sounds. No wheezing, rhonchi or rales.  Chest:  Breasts:     Right: No axillary adenopathy or supraclavicular adenopathy.     Left: No axillary adenopathy or supraclavicular adenopathy.    Abdominal:     General: Bowel sounds are normal. There is no distension.     Palpations: Abdomen is soft. There is no hepatomegaly, splenomegaly or mass.     Tenderness: There is abdominal tenderness in the right lower quadrant. There is no guarding or rebound.     Comments:   Mild tenderness to palpation of the right lower quadrant without palpable mass  Musculoskeletal:        General: Normal range of motion.     Cervical back: Normal range of motion and neck supple. No tenderness.     Right lower leg: No edema.     Left lower leg: No edema.  Lymphadenopathy:     Cervical: No cervical adenopathy.     Upper Body:     Right upper body: No supraclavicular or axillary adenopathy.     Left upper body: No supraclavicular or axillary adenopathy.     Lower Body: No right inguinal adenopathy. No left inguinal adenopathy.  Skin:    General: Skin is warm and dry.     Coloration: Skin is not jaundiced.     Findings: No rash.  Neurological:     Mental Status: He is alert and oriented to person, place, and time.     Cranial  Nerves: No cranial nerve deficit.  Psychiatric:        Mood and Affect: Mood normal.        Behavior: Behavior normal.        Thought Content: Thought content normal.     LABS:   CBC Latest Ref Rng & Units 08/03/2020 05/05/2020  WBC - 108.1 101.6  Hemoglobin 13.5 - 17.5 11.9(A) 11.2(A)  Hematocrit 41 - 53 40(A) 37(A)  Platelets 150 - 399 233 185   CMP Latest Ref Rng & Units 08/03/2020 05/05/2020  BUN 4 - 21 30(A) 21  Creatinine 0.6 - 1.3 0.9 0.9  Sodium 137 - 147 137 138  Potassium 3.4 - 5.3 6.9(A) 5.8(A)  Chloride 99 - 108 106 108  CO2 13 - 22 27(A) 25(A)  Calcium 8.7 - 10.7 8.3(A) 8.4(A)  Alkaline Phos 25 - 125 73 59  AST  14 - 40 60(A) 58(A)  ALT 10 - 40 47(A) 42(A)     No results found for: CEA1 / No results found for: CEA1 No results found for: PSA1 No results found for: RSW546 No results found for: CAN125  No results found for: TOTALPROTELP, ALBUMINELP, A1GS, A2GS, BETS, BETA2SER, GAMS, MSPIKE, SPEI No results found for: TIBC, FERRITIN, IRONPCTSAT No results found for: LDH  STUDIES:  No results found.    HISTORY:   Past Medical History:  Diagnosis Date  . B12 deficiency   . CHF (congestive heart failure) ([facility])   . Diabetes mellitus without complication ([facility])   . History of prostate cancer   . Hyperlipidemia   . Hypertension   . Iron deficiency anemia 02/20/2020  . Leucocytosis   . Leukemia, lymphocytic, chronic ([facility])     Past Surgical History:  Procedure Laterality Date  . PROSTATE BIOPSY      Family History  Problem Relation Age of Onset  . Ovarian cancer Mother 30  . Cancer Sister 8    Social History:  reports that he has quit smoking. He has never used smokeless tobacco. He reports previous alcohol use. He reports that he does not use drugs.The patient is accompanied by his daughter, Fernando Young, today.  Allergies: No Known Allergies  Current Medications: Current Outpatient Medications  Medication Sig Dispense Refill  . albuterol (VENTOLIN HFA)  108 (90 Base) MCG/ACT inhaler Inhale 2 puffs into the lungs every 4 (four) hours as needed for wheezing or shortness of breath.    Marland Kitchen amiodarone (PACERONE) 200 MG tablet Take 1 tablet by mouth daily.    Marland Kitchen Apixaban (ELIQUIS PO) Take by mouth daily.    . Ascorbic Acid (VITAMIN C) 500 MG CAPS Take 500 mg by mouth daily.    Marland Kitchen azelastine (ASTELIN) 0.1 % nasal spray Place into both nostrils 2 (two) times daily. Use in each nostril as directed    . benazepril (LOTENSIN) 20 MG tablet Take 20 mg by mouth daily.    . fluorouracil (EFUDEX) 5 % cream Apply topically 2 (two) times daily.    . hydrochlorothiazide (MICROZIDE) 12.5 MG capsule Take 12.5 mg by mouth daily.    Marland Kitchen levothyroxine (SYNTHROID) 50 MCG tablet Take 50 mcg by mouth daily.    . metFORMIN (GLUCOPHAGE) 500 MG tablet Take by mouth 2 (two) times daily with a meal.    . metoprolol succinate (TOPROL-XL) 25 MG 24 hr tablet Take 25 mg by mouth daily.    . Omega-3 Fatty Acids (FISH OIL) 1000 MG CAPS Take 2,000 mg by mouth daily.    Marland Kitchen omeprazole (PRILOSEC OTC) 20 MG tablet Take 20 mg by mouth daily.    Marland Kitchen rOPINIRole (REQUIP) 0.5 MG tablet Take 0.5 mg by mouth at bedtime. Take 1-2 before sleep    . rosuvastatin (CRESTOR) 10 MG tablet Take 10 mg by mouth daily.    . tamsulosin (FLOMAX) 0.4 MG CAPS capsule Take 0.4 mg by mouth at bedtime.    . traMADol (ULTRAM) 50 MG tablet SMARTSIG:1 By Mouth 4-5 Times Daily    . vitamin B-12 (CYANOCOBALAMIN) 500 MCG tablet Take 500 mcg by mouth daily.     No current facility-administered medications for this visit.     ASSESSMENT & PLAN:   Assessment:  1.  Chronic lymphocytic leukemia, diagnosed in July 2017, which is slowly progressive.  As he is fairly stable, it is not urgent that he start treatment at this time.   We plan acalabrutinib if  he needs to start treatment.   2.  Anemia, which continues to improve.    3.  Severe hyperkalemia, which is mainly spurious due to the severe leukocytosis. However, he is  dehydrated which can contribute to the hyperkalemia, so we would recommend giving IV fluids today. He is drinking mainly diet [facility], which unfortunately has potassium in it.  I have asked him to try to switch to water, or even ice tea, preferably caffeine free. I have given them a note to take to Dr. Heber [place] so that a level can be repeated next week.  Plan:    We will give him IV fluids today and talked to him about drinking mainly water to maintain his level of hydration. He is scheduled for follow-up with Dr. Heber [place] again next week and we will ask him to repeat a BMP to re-evaluate.  We feel the patient can remain on observation and will plan to see him back in 3 months for repeat clinical assessment.  The patient and his daughter understand the plans discussed today and are in agreement with them. They know to contact our office if he develops concerns prior to his next appointment.      Marvia Pickles, PA-C

## 2020-08-03 NOTE — Telephone Encounter (Signed)
Fernando Young in Hematology called with Critical WBC. WBC 108.1. Kelli PA-C notified.

## 2020-08-04 ENCOUNTER — Other Ambulatory Visit: Payer: Self-pay | Admitting: Hematology and Oncology

## 2020-08-05 LAB — HAPTOGLOBIN: Haptoglobin: 260 mg/dL (ref 38–329)

## 2020-11-04 ENCOUNTER — Telehealth: Payer: Self-pay | Admitting: Hematology and Oncology

## 2020-11-04 ENCOUNTER — Other Ambulatory Visit: Payer: Self-pay | Admitting: Hematology and Oncology

## 2020-11-04 ENCOUNTER — Inpatient Hospital Stay: Payer: Medicare Other

## 2020-11-04 ENCOUNTER — Other Ambulatory Visit: Payer: Self-pay

## 2020-11-04 ENCOUNTER — Encounter: Payer: Self-pay | Admitting: Hematology and Oncology

## 2020-11-04 ENCOUNTER — Inpatient Hospital Stay: Payer: Medicare Other | Attending: Oncology | Admitting: Hematology and Oncology

## 2020-11-04 VITALS — BP 131/61 | HR 69 | Temp 97.8°F | Resp 20 | Ht 70.5 in | Wt 154.1 lb

## 2020-11-04 DIAGNOSIS — D509 Iron deficiency anemia, unspecified: Secondary | ICD-10-CM | POA: Diagnosis not present

## 2020-11-04 DIAGNOSIS — C9110 Chronic lymphocytic leukemia of B-cell type not having achieved remission: Secondary | ICD-10-CM | POA: Insufficient documentation

## 2020-11-04 LAB — HEPATIC FUNCTION PANEL
ALT: 29 (ref 10–40)
AST: 29 (ref 14–40)
Alkaline Phosphatase: 61 (ref 25–125)
Bilirubin, Total: 0.2

## 2020-11-04 LAB — BASIC METABOLIC PANEL
BUN: 22 — AB (ref 4–21)
CO2: 21 (ref 13–22)
Chloride: 110 — AB (ref 99–108)
Creatinine: 0.8 (ref 0.6–1.3)
Glucose: 133
Potassium: 4.2 (ref 3.4–5.3)
Sodium: 141 (ref 137–147)

## 2020-11-04 LAB — CBC AND DIFFERENTIAL
HCT: 32 — AB (ref 41–53)
Hemoglobin: 9.8 — AB (ref 13.5–17.5)
Neutrophils Absolute: 8.94
Platelets: 247 (ref 150–399)
WBC: 111.8

## 2020-11-04 LAB — IRON,TIBC AND FERRITIN PANEL
%SAT: 11 — AB (ref 20–50)
Ferritin: 40.6 (ref 17.9–464)
Iron: 34 — AB (ref 49–181)
TIBC: 304 (ref 261–462)

## 2020-11-04 LAB — COMPREHENSIVE METABOLIC PANEL
Albumin: 3 — AB (ref 3.5–5.0)
Calcium: 8.3 — AB (ref 8.7–10.7)

## 2020-11-04 LAB — CBC
MCV: 92 (ref 80–94)
RBC: 3.46 — AB (ref 3.87–5.11)

## 2020-11-04 NOTE — Progress Notes (Signed)
[facility]  [address] [place],  [place]  [address] [phone]  Clinic Day:  11/05/2020  Referring physician: Raelene Bott, MD   CHIEF COMPLAINT:  CC:  Chronic lymphocytic leukemia  Current Treatment:   Observation    HISTORY OF PRESENT ILLNESS:  Fernando Young is a 80 y.o. 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  The CLL was confirmed by flow cytometry in July 2017.  He has had slow progression of his lymphocytosis over time, with mild anemia, but has remained on observation.  He has multiple medical comorbidities including diabetes, congestive heart failure, B12 deficiency, cardiac arrhythmias, and history of prostate cancer. The white blood cell count has steadily increased over the years. The white blood cell count has been running between 100,000 and 116,000 for the last year.  Has been relatively asymptomatic. He was diagnosed with atrial fibrillation, and was scheduled with Dr. Dwyane Dee for cardioversion.   He had been taking oral iron and B12 supplements.  He has undergone multiple skin cancer surgeries. We have had multiple discussions with the patient and his daughter regarding the risk and benefits of treatment for his CLL and have continued observation.  In October 2021, he was found to have iron deficiency despite oral iron supplementation. B12 and folate levels were normal.  He received IV iron in form of Feraheme. Oral iron was discontinued, as we did not feel he was absorbing it. Oral B12 was continued.  His hemoglobin has improved with the IV iron.  INTERVAL HISTORY:  Fernando Young is here today for repeat clinical assessment.  He reports worsening fatigue and weakness. He denies any overt form of blood loss. He denies fevers, chills or night sweats. He denies pain. His appetite is good and he states he is eating well. His weight has decreased 3 pounds over last 3 months .   He has had chronic gait instability and has been having falls. He is using a cane today.  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 His daughter accompanies him today and asked if the dermatologist had telephoned [place] about starting treatment for his CLL, as she was concerned some of his skin cancers may be related.   REVIEW OF SYSTEMS:  Review of Systems  Constitutional:  Negative for appetite change, chills, fatigue, fever and unexpected weight change.  HENT:   Negative for lump/mass, mouth sores and sore throat.   Respiratory:  Negative for cough and shortness of breath.   Cardiovascular:  Negative for chest pain and leg swelling.  Gastrointestinal:  Negative for abdominal pain, constipation, diarrhea, nausea and vomiting.  Genitourinary:  Negative for difficulty urinating, dysuria, frequency and hematuria.   Musculoskeletal:  Negative for arthralgias, back pain and myalgias.  Skin:  Negative for itching, rash and wound.  Neurological:  Negative for dizziness, extremity weakness, headaches, light-headedness and numbness.  Hematological:  Negative for adenopathy.  Psychiatric/Behavioral:  Negative for depression and sleep disturbance. The patient is not nervous/anxious.     VITALS:  Blood pressure 131/61, pulse 69, temperature 97.8 F (36.6 C), temperature source Oral, resp. rate 20, height 5' 10.5" (1.791 m), weight 154 lb 1.6 oz (69.9 kg), SpO2 97 %.  Wt Readings from Last 3 Encounters:  11/04/20 154 lb 1.6 oz (69.9 kg)  08/03/20 157 lb 3.2 oz (71.3 kg)  05/05/20 164 lb 9.6 oz (74.7 kg)    Body mass index is 21.8 kg/m.  Performance status (  ECOG): 1 - Symptomatic but completely ambulatory  PHYSICAL EXAM:  Physical Exam Vitals and nursing note reviewed.  Constitutional:      General: He is not in acute distress.    Appearance: Normal appearance. He is normal weight.  HENT:     Head: Normocephalic and atraumatic.     Mouth/Throat:     Mouth: Mucous membranes are moist.     Pharynx:  Oropharynx is clear. No oropharyngeal exudate or posterior oropharyngeal erythema.  Eyes:     General: No scleral icterus.    Extraocular Movements: Extraocular movements intact.     Conjunctiva/sclera: Conjunctivae normal.     Pupils: Pupils are equal, round, and reactive to light.  Cardiovascular:     Rate and Rhythm: Normal rate and regular rhythm.     Heart sounds: Normal heart sounds. No murmur heard.   No friction rub. No gallop.  Pulmonary:     Effort: Pulmonary effort is normal.     Breath sounds: Normal breath sounds. No wheezing, rhonchi or rales.  Chest:  Breasts:    Right: No axillary adenopathy or supraclavicular adenopathy.     Left: No axillary adenopathy or supraclavicular adenopathy.  Abdominal:     General: Bowel sounds are normal. There is no distension.     Palpations: Abdomen is soft. There is no hepatomegaly, splenomegaly or mass.     Tenderness: There is abdominal tenderness in the right lower quadrant. There is no guarding or rebound.     Comments:   Mild tenderness to palpation of the right lower quadrant without palpable mass  Musculoskeletal:        General: Normal range of motion.     Cervical back: Normal range of motion and neck supple. No tenderness.     Right lower leg: No edema.     Left lower leg: No edema.  Lymphadenopathy:     Cervical: No cervical adenopathy.     Upper Body:     Right upper body: No supraclavicular or axillary adenopathy.     Left upper body: No supraclavicular or axillary adenopathy.     Lower Body: No right inguinal adenopathy. No left inguinal adenopathy.  Skin:    General: Skin is warm and dry.     Coloration: Skin is not jaundiced.     Findings: No rash.     Comments:   Left face is bandaged.  Neurological:     Mental Status: He is alert and oriented to person, place, and time.     Cranial Nerves: No cranial nerve deficit.  Psychiatric:        Mood and Affect: Mood normal.        Behavior: Behavior normal.         Thought Content: Thought content normal.    LABS:   CBC Latest Ref Rng & Units 11/04/2020 08/03/2020 05/05/2020  WBC - 111.8 108.1 101.6  Hemoglobin 13.5 - 17.5 9.8(A) 11.9(A) 11.2(A)  Hematocrit 41 - 53 32(A) 40(A) 37(A)  Platelets 150 - 399 247 233 185   CMP Latest Ref Rng & Units 11/04/2020 08/03/2020 08/03/2020  BUN 4 - 21 22(A) - 30(A)  Creatinine 0.6 - 1.3 0.8 - 0.9  Sodium 137 - 147 141 - 137  Potassium 3.4 - 5.3 4.2 4.4 6.9(A)  Chloride 99 - 108 110(A) - 106  CO2 13 - 22 21 - 27(A)  Calcium 8.7 - 10.7 8.3(A) - 8.3(A)  Total Protein g/dL 5.2 - -  Alkaline Phos 25 -  125 61 - 73  AST 14 - 40 29 - 60(A)  ALT 10 - 40 29 - 47(A)     No results found for: CEA1 / No results found for: CEA1 No results found for: PSA1 No results found for: VOZ366 No results found for: CAN125  No results found for: TOTALPROTELP, ALBUMINELP, A1GS, A2GS, BETS, BETA2SER, GAMS, MSPIKE, SPEI Lab Results  Component Value Date   TIBC 304 11/04/2020   FERRITIN 40.6 11/04/2020   IRONPCTSAT 11 (A) 11/04/2020   Lab Results  Component Value Date   LDH 166 08/03/2020    STUDIES:  No results found.    HISTORY:   Past Medical History:  Diagnosis Date   B12 deficiency    CHF (congestive heart failure) ([facility])    Diabetes mellitus without complication ([facility])    History of prostate cancer    Hyperlipidemia    Hypertension    Iron deficiency anemia 02/20/2020   Leucocytosis    Leukemia, lymphocytic, chronic ([facility])     Past Surgical History:  Procedure Laterality Date   PROSTATE BIOPSY      Family History  Problem Relation Age of Onset   Ovarian cancer Mother 54   Cancer Sister 64    Social History:  reports that he has quit smoking. He has never used smokeless tobacco. He reports previous alcohol use. He reports that he does not use drugs.The patient is accompanied by his daughter, [name], today.  Allergies: No Known Allergies  Current Medications: Current Outpatient Medications  Medication  Sig Dispense Refill   albuterol (VENTOLIN HFA) 108 (90 Base) MCG/ACT inhaler Inhale 2 puffs into the lungs every 4 (four) hours as needed for wheezing or shortness of breath.     amiodarone (PACERONE) 200 MG tablet Take 1 tablet by mouth daily.     Apixaban (ELIQUIS PO) Take by mouth daily.     Ascorbic Acid (VITAMIN C) 500 MG CAPS Take 500 mg by mouth daily.     azelastine (ASTELIN) 0.1 % nasal spray Place into both nostrils 2 (two) times daily. Use in each nostril as directed     benazepril (LOTENSIN) 20 MG tablet Take 20 mg by mouth daily.     fluorouracil (EFUDEX) 5 % cream Apply topically 2 (two) times daily.     hydrochlorothiazide (MICROZIDE) 12.5 MG capsule Take 12.5 mg by mouth daily.     levothyroxine (SYNTHROID) 50 MCG tablet Take 50 mcg by mouth daily.     metFORMIN (GLUCOPHAGE) 500 MG tablet Take by mouth 2 (two) times daily with a meal.     metoprolol succinate (TOPROL-XL) 25 MG 24 hr tablet Take 25 mg by mouth daily.     niacin 500 MG CR capsule Take by mouth.     Omega-3 Fatty Acids (FISH OIL) 1000 MG CAPS Take 2,000 mg by mouth daily.     omeprazole (PRILOSEC OTC) 20 MG tablet Take 20 mg by mouth daily.     rOPINIRole (REQUIP) 0.5 MG tablet Take 0.5 mg by mouth at bedtime. Take 1-2 before sleep     rosuvastatin (CRESTOR) 10 MG tablet Take 10 mg by mouth daily.     tamsulosin (FLOMAX) 0.4 MG CAPS capsule Take 0.4 mg by mouth at bedtime.     traMADol (ULTRAM) 50 MG tablet SMARTSIG:1 By Mouth 4-5 Times Daily     vitamin B-12 (CYANOCOBALAMIN) 500 MCG tablet Take 500 mcg by mouth daily.     No current facility-administered medications for this visit.  ASSESSMENT & PLAN:   Assessment:  1.  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  As he is fairly stable, it is not urgent that he start treatment at this time. We plan acalabrutinib if he needs to start treatment.    2.  Anemia, which has worsened. Iron studies are consistent  with recurrent iron deficiency.We will get him set up for IV Feraheme again as soon as possible   3.  Previous hyperkalemia, mainly secondary to dehydration.  His potassium is normal today.   4.   Mild clinical dehydration, he knows to push clear fluids.  5.  Multiple skin cancers.  He sees his dermatologist again next week.  Plan:     As above, he will receive IV Feraheme for recurrent iron deficiency. His white blood count is fairly stable, so we will continue observation and plan to see him back in 3 months for repeat clinical assessment.  The patient and his daughter understand the plans discussed today and are in agreement with them. They know to contact our office if he develops concerns prior to his next appointment.      Marvia Pickles, PA-C

## 2020-11-04 NOTE — Telephone Encounter (Signed)
Per 7/6 LOS, patient scheduled for Oct Appt's.  Gave patient Appt Summary

## 2020-11-04 NOTE — Telephone Encounter (Signed)
Per 7/6 Staff Msg, patient rescheduled to see [facility] instead of [facility]

## 2020-11-05 ENCOUNTER — Encounter: Payer: Self-pay | Admitting: Hematology and Oncology

## 2020-11-05 LAB — PROTEIN, TOTAL: Total Protein: 5.2 g/dL

## 2020-11-05 LAB — CORRECTED CALCIUM (CC13): Calcium, Corrected: 9.3

## 2020-11-06 ENCOUNTER — Encounter: Payer: Self-pay | Admitting: Hematology and Oncology

## 2020-11-11 ENCOUNTER — Inpatient Hospital Stay: Payer: Medicare Other

## 2020-11-11 ENCOUNTER — Other Ambulatory Visit: Payer: Self-pay

## 2020-11-11 VITALS — BP 116/68 | HR 74 | Temp 98.1°F | Resp 20 | Ht 70.5 in | Wt 151.1 lb

## 2020-11-11 DIAGNOSIS — C9110 Chronic lymphocytic leukemia of B-cell type not having achieved remission: Secondary | ICD-10-CM | POA: Diagnosis present

## 2020-11-11 DIAGNOSIS — D509 Iron deficiency anemia, unspecified: Secondary | ICD-10-CM | POA: Diagnosis not present

## 2020-11-11 MED ORDER — FERUMOXYTOL INJECTION 510 MG/17 ML
510.0000 mg | Freq: Once | INTRAVENOUS | Status: AC
  Administered 2020-11-11: 510 mg via INTRAVENOUS
  Filled 2020-11-11: qty 510

## 2020-11-11 MED ORDER — SODIUM CHLORIDE 0.9 % IV SOLN
Freq: Once | INTRAVENOUS | Status: AC
  Filled 2020-11-11: qty 250

## 2020-11-11 NOTE — Patient Instructions (Signed)
Ferumoxytol injection What is this medication? FERUMOXYTOL is an iron complex. Iron is used to make healthy red blood cells, which carry oxygen and nutrients throughout the body. This medicine is used totreat iron deficiency anemia. This medicine may be used for other purposes; ask your health care provider orpharmacist if you have questions. COMMON BRAND NAME(S): Feraheme What should I tell my care team before I take this medication? They need to know if you have any of these conditions: anemia not caused by low iron levels high levels of iron in the blood magnetic resonance imaging (MRI) test scheduled an unusual or allergic reaction to iron, other medicines, foods, dyes, or preservatives pregnant or trying to get pregnant breast-feeding How should I use this medication? This medicine is for injection into a vein. It is given by a health careprofessional in a hospital or clinic setting. Talk to your pediatrician regarding the use of this medicine in children.Special care may be needed. Overdosage: If you think you have taken too much of this medicine contact apoison control center or emergency room at once. NOTE: This medicine is only for you. Do not share this medicine with others. What if I miss a dose? It is important not to miss your dose. Call your doctor or health careprofessional if you are unable to keep an appointment. What may interact with this medication? This medicine may interact with the following medications: other iron products This list may not describe all possible interactions. Give your health care provider a list of all the medicines, herbs, non-prescription drugs, or dietary supplements you use. Also tell them if you smoke, drink alcohol, or use illegaldrugs. Some items may interact with your medicine. What should I watch for while using this medication? Visit your doctor or healthcare professional regularly. Tell your doctor or healthcare professional if your  symptoms do not start to get better or if theyget worse. You may need blood work done while you are taking this medicine. You may need to follow a special diet. Talk to your doctor. Foods that contain iron include: whole grains/cereals, dried fruits, beans, or peas, leafy greenvegetables, and organ meats (liver, kidney). What side effects may I notice from receiving this medication? Side effects that you should report to your doctor or health care professionalas soon as possible: allergic reactions like skin rash, itching or hives, swelling of the face, lips, or tongue breathing problems changes in blood pressure feeling faint or lightheaded, falls fever or chills flushing, sweating, or hot feelings swelling of the ankles or feet Side effects that usually do not require medical attention (report to yourdoctor or health care professional if they continue or are bothersome): diarrhea headache nausea, vomiting stomach pain This list may not describe all possible side effects. Call your doctor for medical advice about side effects. You may report side effects to FDA at1-800-FDA-1088. Where should I keep my medication? This drug is given in a hospital or clinic and will not be stored at home. NOTE: This sheet is a summary. It may not cover all possible information. If you have questions about this medicine, talk to your doctor, pharmacist, orhealth care provider.  2022 Elsevier/Gold Standard (2016-06-06 20:21:10)  

## 2020-11-13 ENCOUNTER — Encounter: Payer: Self-pay | Admitting: Hematology and Oncology

## 2020-11-16 ENCOUNTER — Inpatient Hospital Stay: Payer: Medicare Other

## 2020-11-16 ENCOUNTER — Encounter: Payer: Self-pay | Admitting: Hematology and Oncology

## 2020-11-16 MED FILL — Ferumoxytol Inj 510 MG/17ML (30 MG/ML) (Elemental Fe): INTRAVENOUS | Qty: 17 | Status: AC

## 2020-11-17 ENCOUNTER — Inpatient Hospital Stay: Payer: Medicare Other

## 2020-11-17 ENCOUNTER — Other Ambulatory Visit: Payer: Self-pay

## 2020-11-17 VITALS — BP 110/65 | HR 69 | Temp 98.2°F | Resp 14 | Ht 70.5 in | Wt 155.2 lb

## 2020-11-17 DIAGNOSIS — D509 Iron deficiency anemia, unspecified: Secondary | ICD-10-CM

## 2020-11-17 DIAGNOSIS — C9110 Chronic lymphocytic leukemia of B-cell type not having achieved remission: Secondary | ICD-10-CM | POA: Diagnosis not present

## 2020-11-17 MED ORDER — SODIUM CHLORIDE 0.9 % IV SOLN
Freq: Once | INTRAVENOUS | Status: AC
  Filled 2020-11-17: qty 250

## 2020-11-17 MED ORDER — SODIUM CHLORIDE 0.9 % IV SOLN
510.0000 mg | Freq: Once | INTRAVENOUS | Status: AC
  Administered 2020-11-17: 510 mg via INTRAVENOUS
  Filled 2020-11-17: qty 17

## 2020-11-17 NOTE — Patient Instructions (Signed)
Ferumoxytol injection What is this medication? FERUMOXYTOL is an iron complex. Iron is used to make healthy red blood cells, which carry oxygen and nutrients throughout the body. This medicine is used totreat iron deficiency anemia. This medicine may be used for other purposes; ask your health care provider orpharmacist if you have questions. COMMON BRAND NAME(S): Feraheme What should I tell my care team before I take this medication? They need to know if you have any of these conditions: anemia not caused by low iron levels high levels of iron in the blood magnetic resonance imaging (MRI) test scheduled an unusual or allergic reaction to iron, other medicines, foods, dyes, or preservatives pregnant or trying to get pregnant breast-feeding How should I use this medication? This medicine is for injection into a vein. It is given by a health careprofessional in a hospital or clinic setting. Talk to your pediatrician regarding the use of this medicine in children.Special care may be needed. Overdosage: If you think you have taken too much of this medicine contact apoison control center or emergency room at once. NOTE: This medicine is only for you. Do not share this medicine with others. What if I miss a dose? It is important not to miss your dose. Call your doctor or health careprofessional if you are unable to keep an appointment. What may interact with this medication? This medicine may interact with the following medications: other iron products This list may not describe all possible interactions. Give your health care provider a list of all the medicines, herbs, non-prescription drugs, or dietary supplements you use. Also tell them if you smoke, drink alcohol, or use illegaldrugs. Some items may interact with your medicine. What should I watch for while using this medication? Visit your doctor or healthcare professional regularly. Tell your doctor or healthcare professional if your  symptoms do not start to get better or if theyget worse. You may need blood work done while you are taking this medicine. You may need to follow a special diet. Talk to your doctor. Foods that contain iron include: whole grains/cereals, dried fruits, beans, or peas, leafy greenvegetables, and organ meats (liver, kidney). What side effects may I notice from receiving this medication? Side effects that you should report to your doctor or health care professionalas soon as possible: allergic reactions like skin rash, itching or hives, swelling of the face, lips, or tongue breathing problems changes in blood pressure feeling faint or lightheaded, falls fever or chills flushing, sweating, or hot feelings swelling of the ankles or feet Side effects that usually do not require medical attention (report to yourdoctor or health care professional if they continue or are bothersome): diarrhea headache nausea, vomiting stomach pain This list may not describe all possible side effects. Call your doctor for medical advice about side effects. You may report side effects to FDA at1-800-FDA-1088. Where should I keep my medication? This drug is given in a hospital or clinic and will not be stored at home. NOTE: This sheet is a summary. It may not cover all possible information. If you have questions about this medicine, talk to your doctor, pharmacist, orhealth care provider.  2022 Elsevier/Gold Standard (2016-06-06 20:21:10)  

## 2020-11-17 NOTE — Progress Notes (Signed)
Discharged home, stable  

## 2020-12-11 ENCOUNTER — Encounter: Payer: Self-pay | Admitting: Hematology and Oncology

## 2020-12-14 ENCOUNTER — Encounter: Payer: Self-pay | Admitting: Hematology and Oncology

## 2021-01-28 NOTE — Progress Notes (Signed)
[facility]  [address] [place],  [place]  [address] [phone]  Clinic Day:  02/04/2021  Referring physician: Raelene Bott, MD  This document serves as a record of services personally performed by [name], MD. It was created on their behalf by [name],[name], a trained medical scribe. The creation of this record is based on the scribe's personal observations and the provider's statements to them.  CHIEF COMPLAINT:  CC:  Chronic lymphocytic leukemia  Current Treatment:   Observation    HISTORY OF PRESENT ILLNESS:  Fernando Young is a 80 y.o. 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  The CLL was confirmed by flow cytometry in July 2017.  He has had slow progression of his lymphocytosis over time, with mild anemia, but has remained on observation.  He has multiple medical comorbidities including diabetes, congestive heart failure, B12 deficiency, cardiac arrhythmias, and history of prostate cancer. The white blood cell count has steadily increased over the years. The white blood cell count has been running between 100,000 and 116,000 for the last year.  Has been relatively asymptomatic. He was diagnosed with atrial fibrillation, and was scheduled with Dr. Dwyane Dee for cardioversion.   He had been taking oral iron and B12 supplements.  He has undergone multiple skin cancer surgeries. We have had multiple discussions with the patient and his daughter regarding the risk and benefits of treatment for his CLL and have continued observation.  In October 2021, he was found to have iron deficiency despite oral iron supplementation. B12 and folate levels were normal.  He received IV iron in form of Feraheme. Oral iron was discontinued, as we did not feel he was absorbing it. Oral B12 was continued.  His hemoglobin has improved with the IV iron. He received IV iron again in July 2022  when his hemoglobin was down to 9.8.   INTERVAL HISTORY:  Derel is here for routine follow up and states that her remains fatigued and somewhat weak. His last fall was 2 weeks ago. He has had multiple biopsies and treatment of the face, and is seeing a dermatology specialist at [facility] by [name], and they have discussed an oral medication (chemotherapy?). Otherwise, he is doing fairly well. White count has increased from 111.8 to 123.9 with an ALC of 112.75, previously 101.74, and 91.2% lymphocytes, stable. Chemistries are unremarkable except for a BUN of 24, and a total protein of 5.7, improved. His  appetite is good, and he has lost 2 pounds since his last visit.  He denies fever, chills or other signs of infection.  He denies nausea, vomiting, bowel issues, or abdominal pain.  He denies sore throat, cough, dyspnea, or chest pain. His daughter accompanies him today.  REVIEW OF SYSTEMS:  Review of Systems  Constitutional:  Positive for fatigue. Negative for appetite change, chills, fever and unexpected weight change.  HENT:  Negative.    Eyes: Negative.   Respiratory: Negative.  Negative for chest tightness, cough, hemoptysis, shortness of breath and wheezing.   Cardiovascular: Negative.  Negative for chest pain, leg swelling and palpitations.  Gastrointestinal: Negative.  Negative for abdominal distention, abdominal pain, blood in stool, constipation, diarrhea, nausea and vomiting.  Endocrine: Negative.   Genitourinary: Negative.  Negative for difficulty urinating, dysuria, frequency and hematuria.   Musculoskeletal:  Positive for gait problem (using a cane). Negative for arthralgias, back pain, flank pain and myalgias.  Skin: Negative.  Neurological:  Positive for extremity weakness (when getting up and down, his last fall was 2 weeks ago) and gait problem (using a cane). Negative for dizziness, headaches, light-headedness, numbness, seizures and speech difficulty.  Hematological:  Negative.   Psychiatric/Behavioral: Negative.  Negative for depression and sleep disturbance. The patient is not nervous/anxious.   All other systems reviewed and are negative.   VITALS:  Blood pressure 105/63, pulse 67, temperature 98.3 F (36.8 C), temperature source Oral, resp. rate 18, height 5' 10.5" (1.791 m), weight 152 lb 14.4 oz (69.4 kg), SpO2 98 %.  Wt Readings from Last 3 Encounters:  02/04/21 152 lb 14.4 oz (69.4 kg)  11/17/20 155 lb 3 oz (70.4 kg)  11/11/20 151 lb 1.9 oz (68.5 kg)    Body mass index is 21.63 kg/m.  Performance status (ECOG): 1 - Symptomatic but completely ambulatory  PHYSICAL EXAM:  Physical Exam Constitutional:      General: He is not in acute distress.    Appearance: Normal appearance. He is normal weight.  HENT:     Head: Normocephalic and atraumatic.  Eyes:     General: No scleral icterus.    Extraocular Movements: Extraocular movements intact.     Conjunctiva/sclera: Conjunctivae normal.     Pupils: Pupils are equal, round, and reactive to light.  Cardiovascular:     Rate and Rhythm: Normal rate and regular rhythm.     Pulses: Normal pulses.     Heart sounds: Normal heart sounds. No murmur heard.   No friction rub. No gallop.  Pulmonary:     Effort: Pulmonary effort is normal. No respiratory distress.     Breath sounds: Normal breath sounds.  Abdominal:     General: Bowel sounds are normal. There is no distension.     Palpations: Abdomen is soft. There is no hepatomegaly, splenomegaly or mass.     Tenderness: There is no abdominal tenderness.  Musculoskeletal:        General: Normal range of motion.     Cervical back: Normal range of motion and neck supple.     Right lower leg: Edema (mild) present.     Left lower leg: Edema (mild) present.  Lymphadenopathy:     Cervical: No cervical adenopathy.  Skin:    General: Skin is warm and dry.  Neurological:     General: No focal deficit present.     Mental Status: He is alert and  oriented to person, place, and time. Mental status is at baseline.  Psychiatric:        Mood and Affect: Mood normal.        Behavior: Behavior normal.        Thought Content: Thought content normal.        Judgment: Judgment normal.    LABS:   CBC Latest Ref Rng & Units 02/04/2021 11/04/2020 08/03/2020  WBC - 123.9 111.8 108.1  Hemoglobin 13.5 - 17.5 11.4(A) 9.8(A) 11.9(A)  Hematocrit 41 - 53 37(A) 32(A) 40(A)  Platelets 150 - 399 184 247 233   CMP Latest Ref Rng & Units 02/04/2021 11/04/2020 08/03/2020  BUN 4 - 21 24(A) 22(A) -  Creatinine 0.6 - 1.3 1.1 0.8 -  Sodium 137 - 147 141 141 -  Potassium 3.4 - 5.3 4.1 4.2 4.4  Chloride 99 - 108 106 110(A) -  CO2 13 - 22 26(A) 21 -  Calcium 8.7 - 10.7 8.3(A) 8.3(A) -  Total Protein g/dL - 5.2 -  Alkaline Phos 25 - 125  72 61 -  AST 14 - 40 34 29 -  ALT 10 - 40 27 29 -    Lab Results  Component Value Date   TIBC 304 11/04/2020   FERRITIN 40.6 11/04/2020   IRONPCTSAT 11 (A) 11/04/2020   Lab Results  Component Value Date   LDH 166 08/03/2020    STUDIES:  No results found.    HISTORY:   Allergies: No Known Allergies  Current Medications: Current Outpatient Medications  Medication Sig Dispense Refill   rosuvastatin (CRESTOR) 20 MG tablet Take 0.5 tablets by mouth daily.     albuterol (VENTOLIN HFA) 108 (90 Base) MCG/ACT inhaler Inhale 2 puffs into the lungs every 4 (four) hours as needed for wheezing or shortness of breath.     amiodarone (PACERONE) 200 MG tablet Take 1 tablet by mouth daily.     Apixaban (ELIQUIS PO) Take by mouth daily.     Ascorbic Acid (VITAMIN C) 500 MG CAPS Take 500 mg by mouth daily.     azelastine (ASTELIN) 0.1 % nasal spray Place into both nostrils 2 (two) times daily. Use in each nostril as directed     benazepril (LOTENSIN) 20 MG tablet Take 20 mg by mouth daily.     fluorouracil (EFUDEX) 5 % cream Apply topically 2 (two) times daily.     furosemide (LASIX) 20 MG tablet Take 20 mg by mouth 2 (two)  times daily.     hydrochlorothiazide (MICROZIDE) 12.5 MG capsule Take 12.5 mg by mouth daily.     levothyroxine (SYNTHROID) 75 MCG tablet Take 75 mcg by mouth daily.     metFORMIN (GLUCOPHAGE) 500 MG tablet Take by mouth 2 (two) times daily with a meal.     metFORMIN (GLUCOPHAGE-XR) 500 MG 24 hr tablet Take 500 mg by mouth 2 (two) times daily.     metoprolol succinate (TOPROL-XL) 25 MG 24 hr tablet Take 25 mg by mouth daily.     niacin 500 MG CR capsule Take by mouth.     Omega-3 Fatty Acids (FISH OIL) 1000 MG CAPS Take 2,000 mg by mouth daily.     omeprazole (PRILOSEC OTC) 20 MG tablet Take 20 mg by mouth daily.     rOPINIRole (REQUIP) 0.5 MG tablet Take 0.5 mg by mouth at bedtime. Take 1-2 before sleep     tamsulosin (FLOMAX) 0.4 MG CAPS capsule Take 0.4 mg by mouth at bedtime.     traMADol (ULTRAM) 50 MG tablet SMARTSIG:1 By Mouth 4-5 Times Daily     vitamin B-12 (CYANOCOBALAMIN) 500 MCG tablet Take 500 mcg by mouth daily.     No current facility-administered medications for this visit.     ASSESSMENT & PLAN:   Assessment:  1.  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 However, he had not had problems with splenomegaly, adenopathy, or thrombocytopenia, and his anemia was due to iron deficiency, and not his CLL.  As he is fairly stable, it is not urgent that he start treatment at this time. We plan acalabrutinib if he needs to start treatment.    2.  Anemia, consistent with recurrent iron deficiency. He last received IV Feraheme in July. His hemoglobin has improved to 11.4 today.   3.  Previous hyperkalemia, mainly secondary to dehydration.  His potassium is normal today.   4.   Mild clinical dehydration, he knows to push clear fluids.  5.  Multiple skin cancers.  He is being followed by a dermatologist  with [facility], [name].  Plan:      His main symptoms from his CLL are weakness and fatigue, which is not likely to be greatly  improved with treatment. His white blood count has slowly worsened, but not significantly, so we will continue observation and plan to see him back in 3 months for repeat clinical assessment.  We will plan to repeat iron studies in the spring unless his hemoglobin drops again. The patient and his daughter understand the plans discussed today and are in agreement with them. They know to contact our office if he develops concerns prior to his next appointment.  I provided 15 minutes of face-to-face time during this this encounter and > 50% was spent counseling as documented under my assessment and plan.    I, Rita Ohara, am acting as scribe for Derwood Kaplan, MD  I have reviewed this report as typed by the medical scribe, and it is complete and accurate.

## 2021-02-03 ENCOUNTER — Other Ambulatory Visit: Payer: Self-pay | Admitting: Oncology

## 2021-02-03 DIAGNOSIS — C9110 Chronic lymphocytic leukemia of B-cell type not having achieved remission: Secondary | ICD-10-CM

## 2021-02-04 ENCOUNTER — Inpatient Hospital Stay: Payer: Medicare Other | Attending: Oncology

## 2021-02-04 ENCOUNTER — Encounter: Payer: Self-pay | Admitting: Oncology

## 2021-02-04 ENCOUNTER — Inpatient Hospital Stay (INDEPENDENT_AMBULATORY_CARE_PROVIDER_SITE_OTHER): Payer: Medicare Other | Admitting: Oncology

## 2021-02-04 ENCOUNTER — Other Ambulatory Visit: Payer: Self-pay | Admitting: Oncology

## 2021-02-04 ENCOUNTER — Other Ambulatory Visit: Payer: Self-pay | Admitting: Hematology and Oncology

## 2021-02-04 ENCOUNTER — Other Ambulatory Visit: Payer: Self-pay

## 2021-02-04 VITALS — BP 105/63 | HR 67 | Temp 98.3°F | Resp 18 | Ht 70.5 in | Wt 152.9 lb

## 2021-02-04 DIAGNOSIS — C9110 Chronic lymphocytic leukemia of B-cell type not having achieved remission: Secondary | ICD-10-CM

## 2021-02-04 DIAGNOSIS — D509 Iron deficiency anemia, unspecified: Secondary | ICD-10-CM | POA: Diagnosis not present

## 2021-02-04 DIAGNOSIS — E860 Dehydration: Secondary | ICD-10-CM

## 2021-02-04 LAB — HEPATIC FUNCTION PANEL
ALT: 27 (ref 10–40)
AST: 34 (ref 14–40)
Alkaline Phosphatase: 72 (ref 25–125)
Bilirubin, Total: 0.3

## 2021-02-04 LAB — CBC AND DIFFERENTIAL
HCT: 37 — AB (ref 41–53)
Hemoglobin: 11.4 — AB (ref 13.5–17.5)
Neutrophils Absolute: 11.15
Platelets: 184 (ref 150–399)
WBC: 123.9

## 2021-02-04 LAB — BASIC METABOLIC PANEL
BUN: 24 — AB (ref 4–21)
CO2: 26 — AB (ref 13–22)
Chloride: 106 (ref 99–108)
Creatinine: 1.1 (ref 0.6–1.3)
Glucose: 86
Potassium: 4.1 (ref 3.4–5.3)
Sodium: 141 (ref 137–147)

## 2021-02-04 LAB — COMPREHENSIVE METABOLIC PANEL
Albumin: 3.5 (ref 3.5–5.0)
Calcium: 8.3 — AB (ref 8.7–10.7)

## 2021-02-04 LAB — CBC: RBC: 3.86 — AB (ref 3.87–5.11)

## 2021-02-08 ENCOUNTER — Encounter: Payer: Self-pay | Admitting: Hematology and Oncology

## 2021-04-21 ENCOUNTER — Encounter: Payer: Self-pay | Admitting: Hematology and Oncology

## 2021-04-21 NOTE — Telephone Encounter (Signed)
Missed appt

## 2021-05-02 DIAGNOSIS — C4440 Unspecified malignant neoplasm of skin of scalp and neck: Secondary | ICD-10-CM | POA: Insufficient documentation

## 2021-05-05 ENCOUNTER — Other Ambulatory Visit: Payer: Self-pay

## 2021-05-05 ENCOUNTER — Telehealth: Payer: Self-pay | Admitting: Hematology and Oncology

## 2021-05-05 ENCOUNTER — Inpatient Hospital Stay: Payer: Medicare Other

## 2021-05-05 ENCOUNTER — Encounter: Payer: Self-pay | Admitting: Hematology and Oncology

## 2021-05-05 ENCOUNTER — Other Ambulatory Visit: Payer: Self-pay | Admitting: Hematology and Oncology

## 2021-05-05 ENCOUNTER — Inpatient Hospital Stay: Payer: Medicare Other | Attending: Oncology | Admitting: Hematology and Oncology

## 2021-05-05 DIAGNOSIS — C9110 Chronic lymphocytic leukemia of B-cell type not having achieved remission: Secondary | ICD-10-CM

## 2021-05-05 DIAGNOSIS — D509 Iron deficiency anemia, unspecified: Secondary | ICD-10-CM

## 2021-05-05 DIAGNOSIS — E162 Hypoglycemia, unspecified: Secondary | ICD-10-CM

## 2021-05-05 DIAGNOSIS — E860 Dehydration: Secondary | ICD-10-CM

## 2021-05-05 DIAGNOSIS — C4491 Basal cell carcinoma of skin, unspecified: Secondary | ICD-10-CM

## 2021-05-05 LAB — HEPATIC FUNCTION PANEL
ALT: 37 (ref 10–40)
AST: 50 — AB (ref 14–40)
Alkaline Phosphatase: 55 (ref 25–125)
Bilirubin, Total: 0.4

## 2021-05-05 LAB — CBC AND DIFFERENTIAL
HCT: 35 — AB (ref 41–53)
Hemoglobin: 11.3 — AB (ref 13.5–17.5)
Platelets: 201 (ref 150–399)
WBC: 154.4

## 2021-05-05 LAB — COMPREHENSIVE METABOLIC PANEL
Albumin: 3.5 (ref 3.5–5.0)
Calcium: 8.5 — AB (ref 8.7–10.7)

## 2021-05-05 LAB — BASIC METABOLIC PANEL
BUN: 56 — AB (ref 4–21)
CO2: 26 — AB (ref 13–22)
Chloride: 106 (ref 99–108)
Creatinine: 1.6 — AB (ref 0.6–1.3)
Glucose: 46
Potassium: 6.3 — AB (ref 3.4–5.3)
Sodium: 138 (ref 137–147)

## 2021-05-05 LAB — CBC: RBC: 3.93 (ref 3.87–5.11)

## 2021-05-05 NOTE — Assessment & Plan Note (Signed)
He is clinically dehydrated but his diuretic therapy has been increased due to worsening dyspnea suspected to be due to heart failure. We advised the patient to drink more fluids.

## 2021-05-05 NOTE — Assessment & Plan Note (Addendum)
His hemoglobin is fairly stable at 11.3. We will continue to monitor this. We will plan to repeat iron studies at his next visit.

## 2021-05-05 NOTE — Progress Notes (Signed)
[facility]  [address] [place],  [place]  [address] [phone]  Clinic Day:  05/05/2021  Referring physician: Raelene Bott, MD  ASSESSMENT & PLAN:   Assessment & Plan: CLL (chronic lymphocytic leukemia) ([facility]) Chronic lymphocytic leukemia diagnosed in July 2017. This continues to progress.  He is more fatigued but has had shortness of breath and is undergoing cardiac evaluation.  He is scheduled for an echocardiogram on Friday.  We discussed possibly starting treatment with acalbrutinib, but we will await the cardiac evaluation. We will also see how he tolerates the Erivedge.  We will plan to see him back in 1 month with a CBC and comprehensive metabolic panel.  Iron deficiency anemia His hemoglobin is fairly stable at 11.3. We will continue to monitor this. We will plan to repeat iron studies at his next visit.  Dehydration He is clinically dehydrated but his diuretic therapy has been increased due to worsening dyspnea suspected to be due to heart failure. We advised the patient to drink more fluids.  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  He had prolonged infection after his last resection.  His dermatologist at [facility] has recommended [place].  His daughter states he has been approved for patient assistance, but they have not started the medication.  Hypoglycemia He has diabetes and is on metformin twice daily.  He does not check his sugar at home. His glucose came back at 46 and he denied symptoms, such as nausea, diaphoresis or weakness/shakiness.  He was given a soft drink and crackers.  He has a follow up scheduled with his PCP on Friday. We advised him to hold metformin until then. A copy of his labs was given to his daughter.   The patient and his daughter understand the plans discussed today and are in agreement with them.  They know to contact our office if he develops concerns prior to his next  appointment.    Marvia Pickles, PA-C  [facility] [address] [place] [address] Dept: [phone] Dept Fax: [phone]   Orders Placed This Encounter  Procedures   CBC with Differential ([facility] Only)    Standing Status:   Future    Standing Expiration Date:   05/06/2022   CMP ([facility] only)    Standing Status:   Future    Standing Expiration Date:   05/06/2022   Ferritin    Standing Status:   Future    Standing Expiration Date:   05/06/2022   Iron and Iron Binding Capacity ([facility] only)    Standing Status:   Future    Standing Expiration Date:   05/06/2022      CHIEF COMPLAINT:  CC: Chronic lymphocytic leukemia  Current Treatment: Observation   HISTORY OF PRESENT ILLNESS:  Fernando Young is a 81 y.o. 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  The CLL was confirmed by flow cytometry in July 2017.  He has had slow progression of his lymphocytosis over time, with mild anemia, but has remained on observation.  He has multiple medical comorbidities including diabetes, congestive heart failure, B12 deficiency, cardiac arrhythmias, and history of prostate cancer. The white blood cell count has steadily increased over the years.   In October 2021, he was found to have iron deficiency despite oral iron supplementation. B12 and folate levels were normal.  He received IV iron in form of Feraheme. Oral iron was discontinued, as we did not feel he was absorbing it. Oral B12 was continued.  His hemoglobin improved but did not normalize with the IV iron.  He received IV iron again in July 2022 when his hemoglobin was down to 9.8. The white blood cell count has been running between 100,000 and 116,000 for the past year.  He has been relatively asymptomatic. He was diagnosed with atrial fibrillation.  He has undergone multiple skin cancer  surgeries and had seen a dermatology specialist at [facility] and was considering an oral chemotherapy. We have had multiple discussions with the patient and his daughter regarding the risk and benefits of treatment for his CLL and have continued observation.  INTERVAL HISTORY:  Fernando Young is here today for repeat clinical assessment and reports increased fatigue for several weeks. He is in a wheelchair today. He also reports dyspnea with exertion and occasional dizziness.  His blood pressure was noted to be low and this was apparently felt to be due to heart function.  He is scheduled for an echocardiogram on Friday.  He states that his furosemide has been increased to 20 mg, 2 tablets twice daily.  He denies fevers or chills. He denies pain. His appetite is good, but he does not drink much fluid. His weight has decreased 4 pounds over last 3 months .  His daughter accompanies him today and states that dermatologist ordered Erivedge, an oral hedgehog pathway inhibitor, to slow the growth of his multiple basal cell skin cancers, but they have not started it yet.  REVIEW OF SYSTEMS:  Review of Systems  Constitutional:  Positive for fatigue. Negative for appetite change, chills, fever and unexpected weight change.  HENT:   Negative for lump/mass, mouth sores and sore throat.   Respiratory:  Positive for shortness of breath. Negative for cough.   Cardiovascular:  Negative for chest pain and leg swelling.  Gastrointestinal:  Negative for abdominal pain, constipation, diarrhea, nausea and vomiting.  Genitourinary:  Negative for difficulty urinating, dysuria, frequency and hematuria.   Musculoskeletal:  Negative for arthralgias, back pain and myalgias.  Skin:  Negative for itching, rash and wound.  Neurological:  Positive for dizziness (intermittent). Negative for extremity weakness, headaches, light-headedness and numbness.  Hematological:  Negative for adenopathy.  Psychiatric/Behavioral:  Negative  for depression and sleep disturbance. The patient is not nervous/anxious.     VITALS:  Blood pressure 101/68, pulse (!) 111, temperature 98.1 F (36.7 C), temperature source Oral, resp. rate 18, height 5' 10.5" (1.791 m), weight 148 lb 6.4 oz (67.3 kg), SpO2 100 %.  Wt Readings from Last 3 Encounters:  05/05/21 148 lb 6.4 oz (67.3 kg)  02/04/21 152 lb 14.4 oz (69.4 kg)  11/17/20 155 lb 3 oz (70.4 kg)    Body mass index is 20.99 kg/m.  Performance status (ECOG): 2 - Symptomatic, <50% confined to bed  PHYSICAL EXAM:  Physical Exam Vitals and nursing note reviewed.  Constitutional:      General: He is not in acute distress.    Appearance: Normal appearance. He is normal weight.  HENT:     Head: Normocephalic and atraumatic.     Mouth/Throat:     Mouth: Mucous membranes are moist.     Pharynx: Oropharynx is clear. No oropharyngeal exudate or posterior oropharyngeal erythema.  Eyes:     General: No scleral icterus.    Extraocular Movements: Extraocular movements intact.     Conjunctiva/sclera:  Conjunctivae normal.     Pupils: Pupils are equal, round, and reactive to light.  Cardiovascular:     Rate and Rhythm: Normal rate. Rhythm irregularly irregular.     Heart sounds: Heart sounds are distant. No murmur heard.   No friction rub. No gallop.  Pulmonary:     Effort: Pulmonary effort is normal.     Breath sounds: Normal breath sounds. No wheezing, rhonchi or rales.  Abdominal:     General: Bowel sounds are normal. There is no distension.     Palpations: Abdomen is soft. There is no hepatomegaly, splenomegaly or mass.     Tenderness: There is no abdominal tenderness.  Musculoskeletal:        General: Normal range of motion.     Cervical back: Normal range of motion and neck supple. No tenderness.     Right lower leg: No edema.     Left lower leg: No edema.  Lymphadenopathy:     Cervical: No cervical adenopathy.     Upper Body:     Right upper body: No supraclavicular or  axillary adenopathy.     Left upper body: No supraclavicular or axillary adenopathy.     Lower Body: No right inguinal adenopathy. No left inguinal adenopathy.  Skin:    General: Skin is warm and dry.     Coloration: Skin is not jaundiced.     Findings: Lesion (multiple skin lesions) present. No rash.  Neurological:     Mental Status: He is alert and oriented to person, place, and time.     Cranial Nerves: No cranial nerve deficit.  Psychiatric:        Mood and Affect: Mood normal.        Behavior: Behavior normal.        Thought Content: Thought content normal.    LABS:   CBC Latest Ref Rng & Units 05/05/2021 02/04/2021 11/04/2020  WBC - 154.4 123.9 111.8  Hemoglobin 13.5 - 17.5 11.3(A) 11.4(A) 9.8(A)  Hematocrit 41 - 53 35(A) 37(A) 32(A)  Platelets 150 - 399 201 184 247   CMP Latest Ref Rng & Units 05/05/2021 02/04/2021 11/04/2020  BUN 4 - 21 56(A) 24(A) 22(A)  Creatinine 0.6 - 1.3 1.6(A) 1.1 0.8  Sodium 137 - 147 138 141 141  Potassium 3.4 - 5.3 6.3(A) 4.1 4.2  Chloride 99 - 108 106 106 110(A)  CO2 13 - 22 26(A) 26(A) 21  Calcium 8.7 - 10.7 8.5(A) 8.3(A) 8.3(A)  Total Protein g/dL - - 5.2  Alkaline Phos 25 - 125 55 72 61  AST 14 - 40 50(A) 34 29  ALT 10 - 40 37 27 29     No results found for: CEA1 / No results found for: CEA1 No results found for: PSA1 No results found for: XKG818 No results found for: CAN125  No results found for: TOTALPROTELP, ALBUMINELP, A1GS, A2GS, BETS, BETA2SER, GAMS, MSPIKE, SPEI Lab Results  Component Value Date   TIBC 304 11/04/2020   FERRITIN 40.6 11/04/2020   IRONPCTSAT 11 (A) 11/04/2020   Lab Results  Component Value Date   LDH 166 08/03/2020    STUDIES:  No results found.    HISTORY:   Past Medical History:  Diagnosis Date   B12 deficiency    Cancer of the skin, 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   CHF (congestive heart failure) ([facility])    Diabetes mellitus without complication ([place])    History of prostate cancer    Hyperlipidemia     Hypertension  Hypoglycemia 05/06/2021   Iron deficiency anemia 02/20/2020   Leucocytosis    Leukemia, lymphocytic, chronic ([facility])     Past Surgical History:  Procedure Laterality Date   PROSTATE BIOPSY      Family History  Problem Relation Age of Onset   Ovarian cancer Mother 31   Cancer Sister 41    Social History:  reports that he has quit smoking. He has never used smokeless tobacco. He reports that he does not currently use alcohol. He reports that he does not use drugs.The patient is accompanied by his daughter today.  Allergies: No Known Allergies  Current Medications: Current Outpatient Medications  Medication Sig Dispense Refill   vismodegib (ERIVEDGE) 150 MG capsule Take 1 capsule by mouth daily.     amiodarone (PACERONE) 200 MG tablet Take 1 tablet by mouth daily.     Ascorbic Acid (VITAMIN C) 500 MG CAPS Take 500 mg by mouth daily.     benazepril (LOTENSIN) 20 MG tablet Take 20 mg by mouth daily.     ELIQUIS 5 MG TABS tablet Take 5 mg by mouth 2 (two) times daily.     furosemide (LASIX) 20 MG tablet Take 20 mg by mouth 2 (two) times daily.     hydrochlorothiazide (MICROZIDE) 12.5 MG capsule Take 12.5 mg by mouth daily.     ketoconazole (NIZORAL) 2 % shampoo Apply topically 3 (three) times a week.     levothyroxine (SYNTHROID) 75 MCG tablet Take 75 mcg by mouth daily.     metFORMIN (GLUCOPHAGE-XR) 500 MG 24 hr tablet Take 500 mg by mouth 2 (two) times daily.     metoprolol succinate (TOPROL-XL) 25 MG 24 hr tablet Take 25 mg by mouth daily.     niacin 500 MG CR capsule Take by mouth.     Omega-3 Fatty Acids (FISH OIL) 1000 MG CAPS Take 2,000 mg by mouth daily.     rOPINIRole (REQUIP) 0.5 MG tablet Take 0.5 mg by mouth at bedtime. Take 1-2 before sleep     rosuvastatin (CRESTOR) 20 MG tablet Take 0.5 tablets by mouth daily.     vitamin B-12 (CYANOCOBALAMIN) 500 MCG tablet Take 500 mcg by mouth daily.     No current facility-administered medications for this visit.

## 2021-05-05 NOTE — Assessment & Plan Note (Addendum)
Chronic lymphocytic leukemia diagnosed in July 2017. This continues to progress.  He is more fatigued but has had shortness of breath and is undergoing cardiac evaluation.  He is scheduled for an echocardiogram on Friday.  We discussed possibly starting treatment with acalbrutinib, but we will await the cardiac evaluation. We will also see how he tolerates the Erivedge.  We will plan to see him back in 1 month with a CBC and comprehensive metabolic panel.

## 2021-05-05 NOTE — Telephone Encounter (Signed)
Per 1/4 los next appt scheduled and given to patient °

## 2021-05-06 ENCOUNTER — Encounter: Payer: Self-pay | Admitting: Hematology and Oncology

## 2021-05-06 DIAGNOSIS — C4491 Basal cell carcinoma of skin, unspecified: Secondary | ICD-10-CM | POA: Insufficient documentation

## 2021-05-06 DIAGNOSIS — E162 Hypoglycemia, unspecified: Secondary | ICD-10-CM

## 2021-05-06 HISTORY — DX: Basal cell carcinoma of skin, unspecified: C44.91

## 2021-05-06 HISTORY — DX: Hypoglycemia, unspecified: E16.2

## 2021-05-06 NOTE — Assessment & Plan Note (Addendum)
He has diabetes and is on metformin twice daily.  He does not check his sugar at home. His glucose came back at 46 and he denied symptoms, such as nausea, diaphoresis or weakness/shakiness.  He was given a soft drink and crackers.  He has a follow up scheduled with his PCP on Friday. We advised him to hold metformin until then. A copy of his labs was given to his daughter.

## 2021-05-06 NOTE — Assessment & Plan Note (Signed)
Multiple basal cell skin cancers.  He had prolonged infection after his last resection.  His dermatologist at [facility] has recommended [place].  His daughter states he has been approved for patient assistance, but they have not started the medication.

## 2021-05-31 NOTE — Progress Notes (Signed)
[name]  [address] [place],  [place]  [address] [phone]  Clinic Day:  06/04/2021  Referring physician: Raelene Bott, MD  This document serves as a record of services personally performed by Fernando Poisson, MD. It was created on their behalf by [name],[name], a trained medical scribe. The creation of this record is based on the scribe's personal observations and the provider's statements to them.  ASSESSMENT & PLAN:   Assessment & Plan: 1.  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 However, he had not had problems with splenomegaly, adenopathy, or thrombocytopenia, and his anemia was due to iron deficiency, and not his CLL.  As he is fairly stable, it is not urgent that he start treatment at this time. We plan acalabrutinib if he needs to start treatment.    2.  Anemia, consistent with recurrent iron deficiency. He last received IV Feraheme in July.    3.  Hyperkalemia, which is significant today at 6.7.  We are transporting him to the emergency room.   4.   Mild clinical dehydration, he knows to push clear fluids.   5.  Multiple skin cancers.  He is being followed by a dermatologist with [name]. He is currently on oral therapy with daily Erivedge.   His white blood count has actually improved today, so we will continue observation. However, the main concern is his significant hyperkalemia of 6.7. At this level, there can be serious cardiac toxicities and so we will transfer him to the emergency department for further evaluation and treatment. Otherwise, we will see him back in 2 months with CBC and CMP for repeat evaluation. We will plan to repeat iron studies in the spring unless his hemoglobin drops again. The patient and his daughter understand the plans discussed today and are in agreement with them. They know to contact our office if he develops concerns prior to his next  appointment.    Fernando Young [address] [place] [address] Dept: [phone] Dept Fax: [phone]   No orders of the defined types were placed in this encounter.     CHIEF COMPLAINT:  CC: Chronic lymphocytic leukemia  Current Treatment: Observation   HISTORY OF PRESENT ILLNESS:  Fernando Young is a 81 y.o. 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  The CLL was confirmed by flow cytometry in July 2017.  He has had slow progression of his lymphocytosis over time, with mild anemia, but has remained on observation.  He has multiple medical comorbidities including diabetes, congestive heart failure, B12 deficiency, cardiac arrhythmias, and history of prostate cancer. The white blood cell count has steadily increased over the years.   In October 2021, he was found to have iron deficiency despite oral iron supplementation. B12 and folate levels were normal.  He received IV iron in form of Feraheme. Oral iron was discontinued, as we did not feel he was absorbing it. Oral B12 was continued.  His hemoglobin improved but did not normalize with the IV iron.  He received IV iron again in July 2022 when his hemoglobin was down to 9.8. The white blood cell count has been running between 100,000 and 116,000 for the past year.  He has been relatively asymptomatic. He was diagnosed with atrial fibrillation.  He has undergone multiple skin cancer surgeries and had seen a  dermatology specialist at [facility], [name] and was considering an oral chemotherapy. We have had multiple discussions with the patient and his daughter regarding the risk and benefits of treatment for his CLL and have continued observation.  INTERVAL HISTORY:  [name] is here for routine follow up and states that he is doing fair. He comes into the clinic in a wheelchair today due to generalized  weakness. Otherwise, he denies complaints. He is taking Erivedge 150 mg daily for the skin cancers of his face. White count has improved from 154.4 to 127.1, hemoglobin has decreased from 11.3 to 10.6, and platelets are normal. Chemistries are remarkable for a potassium of 6.7, a BUN of 31, a creatinine of 1.2, and an SGOT of 74. His  appetite is good, and he has gained 6 pounds since his last visit.  He denies fever, chills or other signs of infection.  He denies nausea, vomiting, bowel issues, or abdominal pain.  He denies sore throat, cough, dyspnea, or chest pain. He is accompanied by his daughter today.  REVIEW OF SYSTEMS:  Review of Systems  Constitutional:  Negative for appetite change, chills, fatigue, fever and unexpected weight change.  HENT:  Negative.    Eyes: Negative.   Respiratory: Negative.  Negative for chest tightness, cough, hemoptysis, shortness of breath and wheezing.   Cardiovascular: Negative.  Negative for chest pain, leg swelling and palpitations.  Gastrointestinal: Negative.  Negative for abdominal distention, abdominal pain, blood in stool, constipation, diarrhea, nausea and vomiting.  Endocrine: Negative.   Genitourinary: Negative.  Negative for difficulty urinating, dysuria, frequency and hematuria.   Musculoskeletal:  Negative for arthralgias, back pain, flank pain, gait problem and myalgias.  Skin: Negative.        Extensive skin cancers of the face  Neurological:  Positive for extremity weakness (in a wheelchair today). Negative for dizziness, gait problem, headaches, light-headedness, numbness, seizures and speech difficulty.  Hematological: Negative.   Psychiatric/Behavioral: Negative.  Negative for depression and sleep disturbance. The patient is not nervous/anxious.   All other systems reviewed and are negative.   VITALS:  Blood pressure (!) 103/58, pulse 60, temperature 98.3 F (36.8 C), temperature source Oral, resp. rate 18, height 5' 10.5" (1.791 m),  weight 154 lb 1.6 oz (69.9 kg), SpO2 97 %.  Wt Readings from Last 3 Encounters:  06/04/21 154 lb 1.6 oz (69.9 kg)  05/05/21 148 lb 6.4 oz (67.3 kg)  02/04/21 152 lb 14.4 oz (69.4 kg)    Body mass index is 21.8 kg/m.  Performance status (ECOG): 2 - Symptomatic, <50% confined to bed  PHYSICAL EXAM:  Physical Exam Vitals and nursing note reviewed.  Constitutional:      General: He is not in acute distress.    Appearance: Normal appearance. He is normal weight.  HENT:     Head: Normocephalic and atraumatic.     Mouth/Throat:     Mouth: Mucous membranes are moist.     Pharynx: Oropharynx is clear. No oropharyngeal exudate or posterior oropharyngeal erythema.  Eyes:     General: No scleral icterus.    Extraocular Movements: Extraocular movements intact.     Conjunctiva/sclera: Conjunctivae normal.     Pupils: Pupils are equal, round, and reactive to light.  Cardiovascular:     Rate and Rhythm: Normal rate and regular rhythm.     Heart sounds: Heart sounds not distant. No murmur heard.   No friction rub. No gallop.  Pulmonary:     Effort: Pulmonary effort is normal.  Breath sounds: Normal breath sounds. No wheezing, rhonchi or rales.  Abdominal:     General: Bowel sounds are normal. There is no distension.     Palpations: Abdomen is soft. There is no hepatomegaly, splenomegaly or mass.     Tenderness: There is no abdominal tenderness.  Musculoskeletal:        General: Normal range of motion.     Cervical back: Normal range of motion and neck supple. No tenderness.     Right lower leg: No edema.     Left lower leg: No edema.  Lymphadenopathy:     Cervical: No cervical adenopathy.     Upper Body:     Right upper body: No supraclavicular or axillary adenopathy.     Left upper body: No supraclavicular or axillary adenopathy.     Lower Body: No right inguinal adenopathy. No left inguinal adenopathy.  Skin:    General: Skin is warm and dry.     Coloration: Skin is not  jaundiced.     Findings: Lesion (multiple nodular and erythematous skin lesions of the face, left greater than right. Some are keratotic.) present. No rash.  Neurological:     Mental Status: He is alert and oriented to person, place, and time.     Cranial Nerves: No cranial nerve deficit.  Psychiatric:        Mood and Affect: Mood normal.        Behavior: Behavior normal.        Thought Content: Thought content normal.    LABS:   CBC Latest Ref Rng & Units 06/04/2021 05/05/2021 02/04/2021  WBC - 127.1 154.4 123.9  Hemoglobin 13.5 - 17.5 10.6(A) 11.3(A) 11.4(A)  Hematocrit 41 - 53 35(A) 35(A) 37(A)  Platelets 150 - 399 211 201 184   CMP Latest Ref Rng & Units 06/04/2021 05/05/2021 02/04/2021  BUN 4 - 21 31(A) 56(A) 24(A)  Creatinine 0.6 - 1.3 1.2 1.6(A) 1.1  Sodium 137 - 147 137 138 141  Potassium 3.4 - 5.3 6.7(A) 6.3(A) 4.1  Chloride 99 - 108 104 106 106  CO2 13 - 22 32(A) 26(A) 26(A)  Calcium 8.7 - 10.7 8.7 8.5(A) 8.3(A)  Total Protein g/dL - - -  Alkaline Phos 25 - 125 54 55 72  AST 14 - 40 74(A) 50(A) 34  ALT 10 - 40 41(A) 37 27    Lab Results  Component Value Date   TIBC 304 11/04/2020   FERRITIN 40.6 11/04/2020   IRONPCTSAT 11 (A) 11/04/2020   Lab Results  Component Value Date   LDH 166 08/03/2020    STUDIES:  No results found.    HISTORY:   Allergies: No Known Allergies  Current Medications: Current Outpatient Medications  Medication Sig Dispense Refill   amiodarone (PACERONE) 200 MG tablet Take 1 tablet by mouth daily.     Ascorbic Acid (VITAMIN C) 500 MG CAPS Take 500 mg by mouth daily.     benazepril (LOTENSIN) 20 MG tablet Take 20 mg by mouth daily.     ELIQUIS 5 MG TABS tablet Take 5 mg by mouth 2 (two) times daily.     furosemide (LASIX) 40 MG tablet Take 40 mg by mouth 2 (two) times daily.     hydrochlorothiazide (MICROZIDE) 12.5 MG capsule Take 12.5 mg by mouth daily.     ketoconazole (NIZORAL) 2 % shampoo Apply topically 3 (three) times a week.      levothyroxine (SYNTHROID) 75 MCG tablet Take 75 mcg by mouth daily.  metFORMIN (GLUCOPHAGE-XR) 500 MG 24 hr tablet Take 500 mg by mouth 2 (two) times daily.     metoprolol succinate (TOPROL-XL) 25 MG 24 hr tablet Take 25 mg by mouth daily.     niacin 500 MG CR capsule Take by mouth.     Omega-3 Fatty Acids (FISH OIL) 1000 MG CAPS Take 2,000 mg by mouth daily.     rOPINIRole (REQUIP) 0.5 MG tablet Take 0.5 mg by mouth at bedtime. Take 1-2 before sleep     rosuvastatin (CRESTOR) 20 MG tablet Take 0.5 tablets by mouth daily.     vismodegib (ERIVEDGE) 150 MG capsule Take 1 capsule by mouth daily.     vitamin B-12 (CYANOCOBALAMIN) 500 MCG tablet Take 500 mcg by mouth daily.     No current facility-administered medications for this visit.    I, Rita Ohara, am acting as scribe for Derwood Kaplan, MD  I have reviewed this report as typed by the medical scribe, and it is complete and accurate.

## 2021-06-02 ENCOUNTER — Other Ambulatory Visit: Payer: Medicare Other

## 2021-06-02 ENCOUNTER — Ambulatory Visit: Payer: Medicare Other | Admitting: Oncology

## 2021-06-04 ENCOUNTER — Inpatient Hospital Stay (INDEPENDENT_AMBULATORY_CARE_PROVIDER_SITE_OTHER): Payer: Medicare Other | Admitting: Oncology

## 2021-06-04 ENCOUNTER — Other Ambulatory Visit: Payer: Self-pay | Admitting: Oncology

## 2021-06-04 ENCOUNTER — Encounter: Payer: Self-pay | Admitting: Oncology

## 2021-06-04 ENCOUNTER — Telehealth: Payer: Self-pay

## 2021-06-04 ENCOUNTER — Inpatient Hospital Stay: Payer: Medicare Other | Attending: Oncology

## 2021-06-04 VITALS — BP 103/58 | HR 60 | Temp 98.3°F | Resp 18 | Ht 70.5 in | Wt 154.1 lb

## 2021-06-04 DIAGNOSIS — C9110 Chronic lymphocytic leukemia of B-cell type not having achieved remission: Secondary | ICD-10-CM

## 2021-06-04 DIAGNOSIS — Z8546 Personal history of malignant neoplasm of prostate: Secondary | ICD-10-CM | POA: Diagnosis not present

## 2021-06-04 DIAGNOSIS — Z7901 Long term (current) use of anticoagulants: Secondary | ICD-10-CM | POA: Insufficient documentation

## 2021-06-04 DIAGNOSIS — I509 Heart failure, unspecified: Secondary | ICD-10-CM | POA: Diagnosis not present

## 2021-06-04 DIAGNOSIS — D509 Iron deficiency anemia, unspecified: Secondary | ICD-10-CM

## 2021-06-04 DIAGNOSIS — E875 Hyperkalemia: Secondary | ICD-10-CM | POA: Diagnosis not present

## 2021-06-04 DIAGNOSIS — E538 Deficiency of other specified B group vitamins: Secondary | ICD-10-CM | POA: Insufficient documentation

## 2021-06-04 DIAGNOSIS — E119 Type 2 diabetes mellitus without complications: Secondary | ICD-10-CM | POA: Insufficient documentation

## 2021-06-04 DIAGNOSIS — E860 Dehydration: Secondary | ICD-10-CM | POA: Insufficient documentation

## 2021-06-04 DIAGNOSIS — R531 Weakness: Secondary | ICD-10-CM | POA: Diagnosis not present

## 2021-06-04 DIAGNOSIS — K4090 Unilateral inguinal hernia, without obstruction or gangrene, not specified as recurrent: Secondary | ICD-10-CM | POA: Insufficient documentation

## 2021-06-04 DIAGNOSIS — N401 Enlarged prostate with lower urinary tract symptoms: Secondary | ICD-10-CM | POA: Insufficient documentation

## 2021-06-04 DIAGNOSIS — I4891 Unspecified atrial fibrillation: Secondary | ICD-10-CM | POA: Insufficient documentation

## 2021-06-04 DIAGNOSIS — Z79899 Other long term (current) drug therapy: Secondary | ICD-10-CM | POA: Insufficient documentation

## 2021-06-04 DIAGNOSIS — E1159 Type 2 diabetes mellitus with other circulatory complications: Secondary | ICD-10-CM | POA: Insufficient documentation

## 2021-06-04 DIAGNOSIS — H02103 Unspecified ectropion of right eye, unspecified eyelid: Secondary | ICD-10-CM | POA: Insufficient documentation

## 2021-06-04 LAB — BASIC METABOLIC PANEL
BUN: 30 — AB (ref 4–21)
BUN: 31 — AB (ref 4–21)
CO2: 32 — AB (ref 13–22)
CO2: 32 — AB (ref 13–22)
Chloride: 104 (ref 99–108)
Chloride: 104 (ref 99–108)
Creatinine: 1.2 (ref 0.6–1.3)
Creatinine: 1.3 (ref 0.6–1.3)
Glucose: 90
Glucose: 93
Potassium: 4.4 (ref 3.4–5.3)
Potassium: 6.7 — AB (ref 3.4–5.3)
Sodium: 137 (ref 137–147)
Sodium: 140 (ref 137–147)

## 2021-06-04 LAB — HEPATIC FUNCTION PANEL
ALT: 40 (ref 10–40)
ALT: 41 — AB (ref 10–40)
AST: 39 (ref 14–40)
AST: 74 — AB (ref 14–40)
Alkaline Phosphatase: 54 (ref 25–125)
Alkaline Phosphatase: 61 (ref 25–125)
Bilirubin, Total: 0.4
Bilirubin, Total: 0.4

## 2021-06-04 LAB — COMPREHENSIVE METABOLIC PANEL
Albumin: 3.2 — AB (ref 3.5–5.0)
Albumin: 3.3 — AB (ref 3.5–5.0)
Calcium: 8.1 — AB (ref 8.7–10.7)
Calcium: 8.7 (ref 8.7–10.7)

## 2021-06-04 LAB — CBC
RBC: 3.8 — AB (ref 3.87–5.11)
RBC: 3.86 — AB (ref 3.87–5.11)

## 2021-06-04 LAB — CBC AND DIFFERENTIAL
HCT: 35 — AB (ref 41–53)
HCT: 36 — AB (ref 41–53)
Hemoglobin: 10.6 — AB (ref 13.5–17.5)
Hemoglobin: 10.7 — AB (ref 13.5–17.5)
Neutrophils Absolute: 9.04
Platelets: 211 (ref 150–399)
Platelets: 211 (ref 150–399)
WBC: 127.1
WBC: 129.2

## 2021-06-04 LAB — IRON AND TIBC
Iron: 37 ug/dL — ABNORMAL LOW (ref 45–182)
Saturation Ratios: 11 % — ABNORMAL LOW (ref 17.9–39.5)
TIBC: 341 ug/dL (ref 250–450)
UIBC: 304 ug/dL

## 2021-06-04 LAB — FERRITIN: Ferritin: 53 ng/mL (ref 24–336)

## 2021-06-04 NOTE — Progress Notes (Signed)
Patient transported to Emergency Room via wheelchair. Daughter notified and with patient at this time. Potassium 6.7.

## 2021-06-04 NOTE — Telephone Encounter (Signed)
Dr Hinton Rao notified of the critical lab: WBC 127.1.

## 2021-06-04 NOTE — Telephone Encounter (Signed)
Dr Hinton Rao notified of pt's critical potassium lab.

## 2021-06-07 ENCOUNTER — Telehealth: Payer: Self-pay | Admitting: Oncology

## 2021-06-07 NOTE — Telephone Encounter (Signed)
06/07/21 spoke with daughter and confirmed next appt

## 2021-06-13 ENCOUNTER — Encounter: Payer: Self-pay | Admitting: Hematology and Oncology

## 2021-07-03 ENCOUNTER — Inpatient Hospital Stay (HOSPITAL_COMMUNITY)
Admission: AD | Admit: 2021-07-03 | Discharge: 2021-07-05 | DRG: 389 | Disposition: A | Discharge status: Home Health | Payer: Medicare Other | Source: Other Acute Inpatient Hospital | Attending: Internal Medicine | Admitting: Internal Medicine

## 2021-07-03 ENCOUNTER — Encounter (HOSPITAL_COMMUNITY): Payer: Self-pay | Admitting: Family Medicine

## 2021-07-03 ENCOUNTER — Other Ambulatory Visit: Payer: Self-pay

## 2021-07-03 DIAGNOSIS — K56600 Partial intestinal obstruction, unspecified as to cause: Secondary | ICD-10-CM | POA: Diagnosis present

## 2021-07-03 DIAGNOSIS — E1159 Type 2 diabetes mellitus with other circulatory complications: Secondary | ICD-10-CM | POA: Diagnosis not present

## 2021-07-03 DIAGNOSIS — Z66 Do not resuscitate: Secondary | ICD-10-CM | POA: Diagnosis present

## 2021-07-03 DIAGNOSIS — E039 Hypothyroidism, unspecified: Secondary | ICD-10-CM | POA: Diagnosis present

## 2021-07-03 DIAGNOSIS — Z8546 Personal history of malignant neoplasm of prostate: Secondary | ICD-10-CM | POA: Diagnosis not present

## 2021-07-03 DIAGNOSIS — E1151 Type 2 diabetes mellitus with diabetic peripheral angiopathy without gangrene: Secondary | ICD-10-CM | POA: Diagnosis present

## 2021-07-03 DIAGNOSIS — C9110 Chronic lymphocytic leukemia of B-cell type not having achieved remission: Secondary | ICD-10-CM | POA: Diagnosis present

## 2021-07-03 DIAGNOSIS — I480 Paroxysmal atrial fibrillation: Secondary | ICD-10-CM | POA: Diagnosis present

## 2021-07-03 DIAGNOSIS — Z87891 Personal history of nicotine dependence: Secondary | ICD-10-CM | POA: Diagnosis not present

## 2021-07-03 DIAGNOSIS — E1169 Type 2 diabetes mellitus with other specified complication: Secondary | ICD-10-CM

## 2021-07-03 DIAGNOSIS — I110 Hypertensive heart disease with heart failure: Secondary | ICD-10-CM | POA: Diagnosis present

## 2021-07-03 DIAGNOSIS — K56609 Unspecified intestinal obstruction, unspecified as to partial versus complete obstruction: Secondary | ICD-10-CM

## 2021-07-03 DIAGNOSIS — Z7901 Long term (current) use of anticoagulants: Secondary | ICD-10-CM | POA: Diagnosis not present

## 2021-07-03 DIAGNOSIS — Z7984 Long term (current) use of oral hypoglycemic drugs: Secondary | ICD-10-CM | POA: Diagnosis not present

## 2021-07-03 DIAGNOSIS — Z79890 Hormone replacement therapy (postmenopausal): Secondary | ICD-10-CM | POA: Diagnosis not present

## 2021-07-03 DIAGNOSIS — I5022 Chronic systolic (congestive) heart failure: Secondary | ICD-10-CM | POA: Diagnosis present

## 2021-07-03 DIAGNOSIS — E785 Hyperlipidemia, unspecified: Secondary | ICD-10-CM | POA: Diagnosis present

## 2021-07-03 DIAGNOSIS — Z79899 Other long term (current) drug therapy: Secondary | ICD-10-CM | POA: Diagnosis not present

## 2021-07-03 DIAGNOSIS — C4490 Unspecified malignant neoplasm of skin, unspecified: Secondary | ICD-10-CM | POA: Diagnosis present

## 2021-07-03 DIAGNOSIS — I4510 Unspecified right bundle-branch block: Secondary | ICD-10-CM | POA: Diagnosis present

## 2021-07-03 DIAGNOSIS — Z20822 Contact with and (suspected) exposure to covid-19: Secondary | ICD-10-CM | POA: Diagnosis present

## 2021-07-03 DIAGNOSIS — K4090 Unilateral inguinal hernia, without obstruction or gangrene, not specified as recurrent: Secondary | ICD-10-CM | POA: Diagnosis present

## 2021-07-03 DIAGNOSIS — D509 Iron deficiency anemia, unspecified: Secondary | ICD-10-CM | POA: Diagnosis present

## 2021-07-03 DIAGNOSIS — I5020 Unspecified systolic (congestive) heart failure: Secondary | ICD-10-CM | POA: Diagnosis present

## 2021-07-03 DIAGNOSIS — C4491 Basal cell carcinoma of skin, unspecified: Secondary | ICD-10-CM | POA: Diagnosis present

## 2021-07-03 DIAGNOSIS — E119 Type 2 diabetes mellitus without complications: Secondary | ICD-10-CM | POA: Diagnosis present

## 2021-07-03 DIAGNOSIS — I10 Essential (primary) hypertension: Secondary | ICD-10-CM | POA: Diagnosis present

## 2021-07-03 MED ORDER — HYDROMORPHONE HCL 1 MG/ML IJ SOLN: 0.5000 mg | INTRAMUSCULAR | Status: DC | PRN

## 2021-07-03 MED ORDER — ONDANSETRON HCL 4 MG/2ML IJ SOLN: 4.0000 mg | Freq: Four times a day (QID) | INTRAMUSCULAR | Status: DC | PRN

## 2021-07-03 MED ORDER — ONDANSETRON HCL 4 MG PO TABS: 4.0000 mg | ORAL_TABLET | Freq: Four times a day (QID) | ORAL | Status: DC | PRN

## 2021-07-03 MED ORDER — SODIUM CHLORIDE 0.9% FLUSH
3.0000 mL | Freq: Two times a day (BID) | INTRAVENOUS | Status: DC
  Administered 2021-07-04 – 2021-07-05 (×2): 3 mL via INTRAVENOUS

## 2021-07-03 MED ORDER — ACETAMINOPHEN 650 MG RE SUPP: 650.0000 mg | Freq: Four times a day (QID) | RECTAL | Status: DC | PRN

## 2021-07-03 MED ORDER — ACETAMINOPHEN 325 MG PO TABS
650.0000 mg | ORAL_TABLET | Freq: Four times a day (QID) | ORAL | Status: DC | PRN
  Administered 2021-07-05: 650 mg via ORAL
  Filled 2021-07-03: qty 2

## 2021-07-03 MED ORDER — SODIUM CHLORIDE 0.9 % IV SOLN: INTRAVENOUS | Status: AC

## 2021-07-03 NOTE — Plan of Care (Signed)
NG tube pulled out on arrival to floor.   "I don't want that again unless I need it".   Assisted Patient to [facility] with urine and BM, and to rest in bed. Denies any pain.  Multiple "skin cancer" lesions of face and left hand, redness and yellow drainage of right eye, perirectal redness, hernia into scrotal area, reports urinary incontinence and use of adult briefs.  Edema +3 pitting BLE.  See assessment flow sheet.   Awaiting Admit by Dr Posey Pronto to discuss plan of care. ?

## 2021-07-03 NOTE — Progress Notes (Signed)
Received report from [place], Therapist, sports. ?

## 2021-07-03 NOTE — H&P (Signed)
History and Physical    Fernando Young:[id number] DOB: 12-11-1940 DOA: 07/03/2021  PCP: Raelene Bott, MD  Patient coming from: [facility] ED  I have personally briefly reviewed patient's old medical records in [place]  Chief Complaint: Small bowel obstruction  HPI: Fernando Young is a 81 y.o. male with medical history significant for CLL, HFrEF (EF 25% by TTE 05/07/2021), paroxysmal A-fib/flutter on Eliquis and amiodarone, T2DM, HTN, HLD, iron deficiency anemia, hypothyroidism, multiple skin cancers (on cemiplimab and vismodegib daily) who presented to [facility] ED for evaluation of abdominal pain with nausea and vomiting.  Patient reports developing some abdominal pain morning of 3/3.  He does not have any significant symptoms and at dinner had a cheeseburger.  He subsequently developed severe abdominal pain followed by nausea and vomiting.  He went to [facility] ED for further evaluation.  In the ED he had a CT scan which was suggestive of at least partial small bowel obstruction with transition point in the mid abdomen.  NG tube was placed and he was transferred for further management.  On arrival, NG was no longer in place after patient self removed it.  He did have a loose bowel movement on arrival.  He says this was his first bowel movement since the symptoms began.  He denies any further nausea or abdominal pain.  He is on chronic anticoagulation with Eliquis and last dose was morning of 3/3.  He denies any chest pain, dyspnea.  Patient does tell me he has a history of ruptured bowel with prior surgical fixation.  He says he does have residual scar tissue which causes him to have infrequent episodes of bowel obstruction.  He also has a large left inguinal hernia which is stable.  [facility] ED Course   Labs/Imaging on admission: I have personally reviewed following labs and imaging studies.  Labs significant for WBC 206.3 (baseline 120-130s),  hemoglobin 12.0, platelets 282,000, sodium 138, potassium 4.2, bicarb 28, BUN 31, creatinine 1.10, serum glucose 141, LFTs within normal limits, lipase 64, lactic acid 0.8.  SARS-CoV-2, influenza A/B, and RSV PCRs were negative.  CT abdomen/pelvis with contrast obtained with impression copied below: IMPRESSION: --Multiple dilated loops of small bowel measuring up to 4.2 cm with transition point in the midabdomen. Findings concerning for at least a partial small bowel obstruction.  --Wall thickening and prominent inflammatory changes involving segment of distal small bowel in similar distribution compared to 08/23/2019 study. Findings may be infectious/inflammatory in etiology, consider inflammatory bowel disease given location.  --Large left inguinal hernia containing long segment of descending colon and proximal sigmoid colon. There is fluid within the hernia sac which could be seen with inflammation. The hernia sac is incompletely imaged.  Per accepting physician report, case was discussed with general surgery who will consult that patient at [facility] long.  Patient was transferred to be admitted under the hospitalist service.  Review of Systems: All systems reviewed and are negative except as documented in history of present illness above.   Past Medical History:  Diagnosis Date   B12 deficiency    Cancer of the skin, 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   CHF (congestive heart failure) (HCC)    Diabetes mellitus without complication ([place])    History of prostate cancer    Hyperlipidemia    Hypertension    Hypoglycemia 05/06/2021   Iron deficiency anemia 02/20/2020   Leucocytosis    Leukemia, lymphocytic, chronic (HCC)     Past Surgical History:  Procedure Laterality Date   PROSTATE BIOPSY      Social History:  reports that he has quit smoking. He has never used smokeless tobacco. He reports that he does not currently use alcohol. He reports that he does not use drugs.  No Known  Allergies  Family History  Problem Relation Age of Onset   Ovarian cancer Mother 70   Cancer Sister 6     Prior to Admission medications   Medication Sig Start Date End Date Taking? Authorizing Provider  amiodarone (PACERONE) 200 MG tablet Take 1 tablet by mouth daily. 03/10/20   [provider]  Ascorbic Acid (VITAMIN C) 500 MG CAPS Take 500 mg by mouth daily.    [provider]  benazepril (LOTENSIN) 20 MG tablet Take 20 mg by mouth daily.    [provider]  ELIQUIS 5 MG TABS tablet Take 5 mg by mouth 2 (two) times daily. 04/13/21   [provider]  furosemide (LASIX) 40 MG tablet Take 40 mg by mouth 2 (two) times daily. 04/15/21   [provider]  hydrochlorothiazide (MICROZIDE) 12.5 MG capsule Take 12.5 mg by mouth daily.    [provider]  ketoconazole (NIZORAL) 2 % shampoo Apply topically 3 (three) times a week. 02/03/21   [provider]  levothyroxine (SYNTHROID) 75 MCG tablet Take 75 mcg by mouth daily. 12/01/20   [provider]  metFORMIN (GLUCOPHAGE-XR) 500 MG 24 hr tablet Take 500 mg by mouth 2 (two) times daily. 12/03/20   [provider]  metoprolol succinate (TOPROL-XL) 25 MG 24 hr tablet Take 25 mg by mouth daily.    [provider]  niacin 500 MG CR capsule Take by mouth.    [provider]  Omega-3 Fatty Acids (FISH OIL) 1000 MG CAPS Take 2,000 mg by mouth daily.    [provider]  rOPINIRole (REQUIP) 0.5 MG tablet Take 0.5 mg by mouth at bedtime. Take 1-2 before sleep    [provider]  rosuvastatin (CRESTOR) 20 MG tablet Take 0.5 tablets by mouth daily. 02/04/21   [provider]  vismodegib (ERIVEDGE) 150 MG capsule Take 1 capsule by mouth daily. 03/08/21   [provider]  vitamin B-12 (CYANOCOBALAMIN) 500 MCG tablet Take 500 mcg by mouth daily.    [provider]    Physical Exam: Vitals:   07/03/21 2200  BP: (!) 141/73   Pulse: 67  Resp: 18  Temp: 97.6 F (36.4 C)  TempSrc: Oral  SpO2: 99%  Weight: 63.5 kg  Height: 5' 10.5" (1.791 m)   Constitutional: Chronically ill-appearing elderly man resting supine in bed, NAD, calm, comfortable Eyes: PERRL, lids and conjunctivae normal ENMT: Mucous membranes are dry.  Edentulous.  Posterior pharynx clear of any exudate or lesions. Neck: normal, supple, no masses. Respiratory: clear to auscultation bilaterally, no wheezing, no crackles. Normal respiratory effort. No accessory muscle use.  Cardiovascular: Regular rate and rhythm, no murmurs / rubs / gallops.  Trace bilateral lower extremity edema above the ankles.  Abdomen: Soft, no tenderness, no masses palpated. No hepatosplenomegaly. Bowel sounds present.  Large left inguinal hernia, soft, nontender. Musculoskeletal: no clubbing / cyanosis. No joint deformity upper and lower extremities. Good ROM, no contractures. Normal muscle tone.  Skin: Numerous skin cancer lesions on face and actinic keratosis lesion changes on the extremities Neurologic: CN 2-12 grossly intact. Sensation intact. Strength 5/5 in all 4.  Psychiatric: Alert and oriented x 3. Normal mood.   EKG: Ordered  and pending.  Assessment/Plan Principal Problem:   SBO (small bowel obstruction) (HCC) Active Problems:   CLL (chronic lymphocytic leukemia) (HCC)   HFrEF (heart failure with reduced ejection fraction) (HCC)   Paroxysmal atrial fibrillation (HCC)   Iron deficiency anemia   Type 2 diabetes mellitus with vascular disease ([place])   Acquired hypothyroidism   Cancer of the skin, basal cell   Hyperlipidemia associated with type 2 diabetes mellitus ([place])   Fernando Young is a 81 y.o. male with medical history significant for CLL, HFrEF (EF 25% by TTE 05/07/2021), paroxysmal A-fib/flutter on Eliquis and amiodarone, T2DM, HTN, HLD, iron deficiency anemia, hypothyroidism, multiple skin cancers (on cemiplimab and vismodegib daily) who is admitted from  [facility] ED for management of SBO.  Assessment and Plan: * SBO (small bowel obstruction) (HCC) CT imaging at outside hospital suggestive of at least partial small bowel obstruction.  NG tube self removed.  He had a bowel movement on arrival.  He denies any further nausea, vomiting, or abdominal pain.  Appears SBO may be resolving. -General surgery to follow -KUB in a.m. -Keep n.p.o. for now -Gentle IV fluids with NS'@50'$  mL/hour tonight -Antiemetics and analgesics as needed  CLL (chronic lymphocytic leukemia) ([place]) Follows with oncology, Dr. [name].  Not on active treatment.  WBC significantly increased to 206 compared to baseline around 120-130.  May be related to SBO.  Repeat labs in AM.  HFrEF (heart failure with reduced ejection fraction) (HCC) Appears euvolemic on admission.  EF was 25% on TTE 05/07/2021. -On gentle IV fluids while n.p.o. as above -Monitor strict I/O's and daily weights -If continues to improve from SBO standpoint, anticipate can resume home meds on 3/5  Paroxysmal atrial fibrillation (HCC) Rate controlled and appears to be in regular rhythm on exam. -Place on telemetry, obtain EKG -Can likely resume home Amiodarone and Eliquis tomorrow if continues to improve from SBO standpoint and not needing surgical intervention  Type 2 diabetes mellitus with vascular disease ([place]) Monitor CBGs while NPO, add SSI if needed.  Iron deficiency anemia Hemoglobin stable at outside hospital.  Continue to monitor.  Acquired hypothyroidism Resume Synthroid when able.  Cancer of the skin, basal cell Follows with [facility] dermatology for management of nonmelanoma skin cancer.  On active treatment with vismodegib, on hold for now.  Hyperlipidemia associated with type 2 diabetes mellitus ([place]) Resume rosuvastatin when taking orals.  DVT prophylaxis: SCDs Start: 07/04/21 0110 Code Status: DNR, confirmed with patient on admission after extensive discussion regarding  goals of care. Family Communication: Discussed with patient, he has discussed with family Disposition Plan: From home, dispo pending clinical progress Consults called: General surgery Severity of Illness: The appropriate patient status for this patient is OBSERVATION. Observation status is judged to be reasonable and necessary in order to provide the required intensity of service to ensure the patient's safety. The patient's presenting symptoms, physical exam findings, and initial radiographic and laboratory data in the context of their medical condition is felt to place them at decreased risk for further clinical deterioration. Furthermore, it is anticipated that the patient will be medically stable for discharge from the hospital within 2 midnights of admission.   Zada Finders MD [facility]  If 7PM-7AM, please contact night-coverage [email or web address]  07/04/2021, 1:10 AM

## 2021-07-04 ENCOUNTER — Inpatient Hospital Stay (HOSPITAL_COMMUNITY): Payer: Medicare Other

## 2021-07-04 ENCOUNTER — Encounter: Payer: Self-pay | Admitting: Hematology and Oncology

## 2021-07-04 LAB — COMPREHENSIVE METABOLIC PANEL
ALT: 32 U/L (ref 0–44)
AST: 36 U/L (ref 15–41)
Albumin: 2.9 g/dL — ABNORMAL LOW (ref 3.5–5.0)
Alkaline Phosphatase: 58 U/L (ref 38–126)
Anion gap: 7 (ref 5–15)
BUN: 29 mg/dL — ABNORMAL HIGH (ref 8–23)
CO2: 25 mmol/L (ref 22–32)
Calcium: 7.9 mg/dL — ABNORMAL LOW (ref 8.9–10.3)
Chloride: 108 mmol/L (ref 98–111)
Creatinine, Ser: 0.89 mg/dL (ref 0.61–1.24)
GFR, Estimated: >60 mL/min (ref >60)
Glucose, Bld: 82 mg/dL (ref 70–99)
Potassium: 4.3 mmol/L (ref 3.5–5.1)
Sodium: 140 mmol/L (ref 135–145)
Total Bilirubin: 0.4 mg/dL (ref 0.3–1.2)
Total Protein: 5.5 g/dL — ABNORMAL LOW (ref 6.5–8.1)

## 2021-07-04 LAB — CBC WITH DIFFERENTIAL/PLATELET
Abs Immature Granulocytes: 0 10*3/uL (ref 0.00–0.07)
Basophils Absolute: 0 10*3/uL (ref 0.0–0.1)
Basophils Relative: 0 %
Eosinophils Absolute: 0 10*3/uL (ref 0.0–0.5)
Eosinophils Relative: 0 %
HCT: 32.5 % — ABNORMAL LOW (ref 39.0–52.0)
Hemoglobin: 9.7 g/dL — ABNORMAL LOW (ref 13.0–17.0)
Lymphocytes Relative: 96 %
Lymphs Abs: 130.6 10*3/uL — ABNORMAL HIGH (ref 0.7–4.0)
MCH: 28.3 pg (ref 26.0–34.0)
MCHC: 29.8 g/dL — ABNORMAL LOW (ref 30.0–36.0)
MCV: 94.8 fL (ref 80.0–100.0)
Monocytes Absolute: 1.4 10*3/uL — ABNORMAL HIGH (ref 0.1–1.0)
Monocytes Relative: 1 %
Neutro Abs: 4.1 10*3/uL (ref 1.7–7.7)
Neutrophils Relative %: 3 %
Platelets: 190 10*3/uL (ref 150–400)
RBC: 3.43 MIL/uL — ABNORMAL LOW (ref 4.22–5.81)
RDW: 16.7 % — ABNORMAL HIGH (ref 11.5–15.5)
WBC: 136 10*3/uL (ref 4.0–10.5)
nRBC: 0 % (ref 0.0–0.2)

## 2021-07-04 LAB — MAGNESIUM: Magnesium: 1.9 mg/dL (ref 1.7–2.4)

## 2021-07-04 LAB — GLUCOSE, CAPILLARY
Glucose-Capillary: 80 mg/dL (ref 70–99)
Glucose-Capillary: 147 mg/dL — ABNORMAL HIGH (ref 70–99)
Glucose-Capillary: 161 mg/dL — ABNORMAL HIGH (ref 70–99)

## 2021-07-04 MED ORDER — ROPINIROLE HCL 1 MG PO TABS
0.5000 mg | ORAL_TABLET | Freq: Every day | ORAL | Status: DC
  Administered 2021-07-04: 0.5 mg via ORAL
  Filled 2021-07-04: qty 1

## 2021-07-04 MED ORDER — AMIODARONE HCL 200 MG PO TABS
200.0000 mg | ORAL_TABLET | Freq: Every day | ORAL | Status: DC
  Administered 2021-07-04 – 2021-07-05 (×2): 200 mg via ORAL
  Filled 2021-07-04 (×2): qty 1

## 2021-07-04 MED ORDER — ROSUVASTATIN CALCIUM 10 MG PO TABS
10.0000 mg | ORAL_TABLET | Freq: Every day | ORAL | Status: DC
  Administered 2021-07-04 – 2021-07-05 (×2): 10 mg via ORAL
  Filled 2021-07-04 (×2): qty 1

## 2021-07-04 MED ORDER — BENAZEPRIL HCL 20 MG PO TABS
20.0000 mg | ORAL_TABLET | Freq: Every day | ORAL | Status: DC
  Administered 2021-07-05: 20 mg via ORAL
  Filled 2021-07-04: qty 1

## 2021-07-04 MED ORDER — ASCORBIC ACID 500 MG PO TABS
500.0000 mg | ORAL_TABLET | Freq: Every day | ORAL | Status: DC
  Administered 2021-07-04 – 2021-07-05 (×2): 500 mg via ORAL
  Filled 2021-07-04 (×2): qty 1

## 2021-07-04 MED ORDER — LEVOTHYROXINE SODIUM 50 MCG PO TABS
75.0000 ug | ORAL_TABLET | Freq: Every day | ORAL | Status: DC
  Administered 2021-07-04 – 2021-07-05 (×2): 75 ug via ORAL
  Filled 2021-07-04 (×2): qty 1

## 2021-07-04 MED ORDER — NAPHAZOLINE-GLYCERIN 0.012-0.25 % OP SOLN
1.0000 [drp] | Freq: Two times a day (BID) | OPHTHALMIC | Status: DC
  Administered 2021-07-04: 1 [drp] via OPHTHALMIC
  Administered 2021-07-04: 2 [drp] via OPHTHALMIC
  Administered 2021-07-05: 1 [drp] via OPHTHALMIC
  Filled 2021-07-04: qty 15

## 2021-07-04 MED ORDER — VISMODEGIB 150 MG PO CAPS
150.0000 mg | ORAL_CAPSULE | Freq: Every day | ORAL | Status: DC
  Administered 2021-07-05: 150 mg via ORAL

## 2021-07-04 MED ORDER — FUROSEMIDE 20 MG PO TABS
20.0000 mg | ORAL_TABLET | Freq: Two times a day (BID) | ORAL | Status: DC
Start: 2021-07-05 — End: 2021-07-05
  Administered 2021-07-05: 20 mg via ORAL
  Filled 2021-07-04: qty 1

## 2021-07-04 NOTE — Progress Notes (Addendum)
?PROGRESS NOTE ? ? ? ?Fernando Young  PQZ:[id number] DOB: Aug 31, 1940 DOA: 07/03/2021 ?PCP: Raelene Bott, MD  ? ? ? ?Brief Narrative:  ?Fernando Young is a 81 y.o. male with medical history significant for CLL, HFrEF (EF 25% by TTE 05/07/2021), paroxysmal A-fib/flutter on Eliquis and amiodarone, T2DM, HTN, HLD, iron deficiency anemia, hypothyroidism, multiple skin cancers (on cemiplimab and vismodegib daily) who is admitted from [facility] ED for management of SBO. ? ? ?[facility]/ED called about blood cultures taken on [place]. 1/4 cultures showed species of staphlococcus present. Likely contamination , will need to follow up ? ?Subjective: ? ?Currently denies pain, no nausea ,no vomiting, reported had bowel movement this morning ?Son at bedside ? ?Assessment & Plan: ? Principal Problem: ?  SBO (small bowel obstruction) ([place]) ?Active Problems: ?  CLL (chronic lymphocytic leukemia) ([place]) ?  HFrEF (heart failure with reduced ejection fraction) ([place]) ?  Paroxysmal atrial fibrillation (HCC) ?  Iron deficiency anemia ?  Type 2 diabetes mellitus with vascular disease ([place]) ?  Acquired hypothyroidism ?  Cancer of the skin, basal cell ?  Hyperlipidemia associated with type 2 diabetes mellitus ([place]) ? ? ? ?Assessment and Plan: ? ?SBO ?-Reports sudden onset of stomach pain Friday morning ?-CT outside hospital showed at least partial small bowel obstruction ?-He self removed NG tube ?-Had bowel movement this morning ?-Seen by general surgery, ordered diet, general surgery recommend continue hold Eliquis in case needing surgery, resume Eliquis if no surgery planned ? ? ?CLL (chronic lymphocytic leukemia) ([place]) ?Follows with oncology, Dr. [name].  Not on active treatment.  WBC significantly increased to 206 compared to baseline around 120-130.  May be related to SBO.  Repeat labs in AM. ?Family request Dr. Hinton Rao to be informed on Monday ? ?HFrEF (heart failure with reduced ejection fraction) (HCC) ?- EF  was 25% on TTE 05/07/2021. ?-Received brief IV fluids , currently off IV fluids , hold Lasix today , resume tomorrow if no surgery planned  ?-Monitor strict I/O's and daily weights ?Appears euvolemic  ? ?Paroxysmal atrial fibrillation (HCC) ?Rate controlled and appears to be in regular rhythm on exam. ?- resume home Amiodarone  ?-hold  Eliquis , resume if ok with general surgery ? ? ?Type 2 diabetes mellitus with vascular disease ([place]), controlled ?Noninsulin-dependent ?Hold metformin for now ?Monitor CBGs while NPO, add SSI if needed. ? ?Hyperlipidemia associated with type 2 diabetes mellitus ([place]) ?Resume rosuvastatin  ? ?Iron deficiency anemia ?Hemoglobin stable at outside hospital.  Continue to monitor. ?Follow hematology oncology ? ?Acquired hypothyroidism ?Resume Synthroid ? ?Cancer of the skin, basal cell ?Follows with [facility] dermatology for management of nonmelanoma skin cancer.  On active treatment with vismodegib, resume ? ? ? ? ?: Body mass index is 19.8 kg/m?Marland Kitchen. ? ? ? ?I have Reviewed nursing notes, Vitals, pain scores, I/o's, Lab results and  imaging results since pt's last encounter, details please see discussion above  ?I ordered the following labs:  ?Unresulted Labs (From admission, onward)  ? ? None  ? ?  ? ? ? ?DVT prophylaxis: SCDs Start: 07/04/21 0110 ? ? ?Code Status:   Code Status: DNR ? ?Family Communication: Son at bedside with permission ?Disposition:  ? ?Status is: Inpatient ? ?Dispo: The patient is from: Home ?             Anticipated d/c is to: Home ?             Anticipated d/c date is: Possible 24 to  48 hours, need general surgery clearance ? ?Antimicrobials:   ? ?Anti-infectives (From admission, onward)  ? ? None  ? ?  ? ? ? ? ? ?Objective: ?Vitals:  ? 07/04/21 0120 07/04/21 0527 07/04/21 1125 07/04/21 1518  ?BP: 131/75 (!) 98/50 125/64 126/70  ?Pulse:  (!) 59 63 63  ?Resp: '20 18 16 '$ (!) 22  ?Temp: 99.1 ?F (37.3 ?C) (!) 97.5 ?F (36.4 ?C) 97.6 ?F (36.4 ?C) 97.7 ?F (36.5 ?C)  ?TempSrc: Oral  Oral Oral Oral  ?SpO2: 95% 96% 97% 100%  ?Weight:      ?Height:      ? ? ?Intake/Output Summary (Last 24 hours) at 07/04/2021 2008 ?Last data filed at 07/04/2021 0433 ?Gross per 24 hour  ?Intake 1000 ml  ?Output 400 ml  ?Net 600 ml  ? ?Filed Weights  ? 07/03/21 2200  ?Weight: 63.5 kg  ? ? ?Examination: ? ?General exam: alert, awake, communicative,calm, NAD ?Respiratory system: Clear to auscultation. Respiratory effort normal. ?Cardiovascular system:  RRR.  ?Gastrointestinal system: Abdomen is nondistended, soft and nontender.  Normal bowel sounds heard. ?Central nervous system: Alert and oriented. No focal neurological deficits. ?Extremities:  no edema ?Skin: No rashes, lesions or ulcers ?Psychiatry: Judgement and insight appear normal. Mood & affect appropriate.  ? ? ? ?Data Reviewed: I have personally reviewed  labs and visualized  imaging studies since the last encounter and formulate the plan  ? ? ? ? ? ? ?Scheduled Meds: ? amiodarone  200 mg Oral Daily  ? ascorbic acid  500 mg Oral Daily  ? [START ON 07/05/2021] benazepril  20 mg Oral Daily  ? [START ON 07/05/2021] furosemide  20 mg Oral BID  ? levothyroxine  75 mcg Oral Daily  ? naphazoline-glycerin  1-2 drop Right Eye BID  ? rOPINIRole  0.5 mg Oral QHS  ? rosuvastatin  10 mg Oral Daily  ? sodium chloride flush  3 mL Intravenous Q12H  ? vismodegib  150 mg Oral Daily  ? ?Continuous Infusions: ? ? LOS: 1 day  ? ? ?Florencia Reasons, MD PhD FACP ?[facility] ? ?Available via Epic secure chat 7am-7pm for nonurgent issues ?Please page for urgent issues ?To page the attending provider between 7A-7P or the covering provider during after hours 7P-7A, please log into the web site [email or web address] and access using universal [place] password for that web site. If you do not have the password, please call the hospital operator. ? ? ? ?07/04/2021, 8:08 PM  ? ? ?

## 2021-07-04 NOTE — Assessment & Plan Note (Addendum)
CT imaging at outside hospital suggestive of at least partial small bowel obstruction.  NG tube self removed.  He had a bowel movement on arrival.  He denies any further nausea, vomiting, or abdominal pain.  ? ?Patient was placed on supportive medical therapy with improvement of his symptoms. ?Hid diet was advanced with good toleration. ? ?At the time of his discharge he had no nausea or vomiting, no abdominal pain and positive bowel movement.  ?Follow up as outpatient.  ?

## 2021-07-04 NOTE — Assessment & Plan Note (Addendum)
Patient remained rate control and sinus rhythm during his hospitalization.  ? ?Plan to continue with apixaban for anticoagulation, along with amiodarone for rate and rhythm control.  ?

## 2021-07-04 NOTE — Assessment & Plan Note (Addendum)
Continue statin therapy.

## 2021-07-04 NOTE — Assessment & Plan Note (Addendum)
Follows with [facility] dermatology for management of nonmelanoma skin cancer.  On active treatment with vismodegib.  ?

## 2021-07-04 NOTE — Assessment & Plan Note (Addendum)
Patient was placed on insulin sliding scale for glucose cover and monitoring. ?His glucose remained well controlled, at the time of his discharge was 110 mg/dl (fasting).  ?At discharge will resume metformin.  ?

## 2021-07-04 NOTE — Consult Note (Addendum)
Reason for Consult:sbo ?Referring Physician: Dr Posey Pronto ? ?Fernando Young is an 81 y.o. male.  ?HPI: 36 yof m with prior elap for perforated bowel many years ago.  He has pmh of cll, chf, afib on eliquis who presented to an outside er with ab pain, n/v.  He had a ct there by report (there are no images here unfortunately) and had a psbo.  Had an ng tube and then long delay for transport.  It was removed at some point by patient and now he is having bowel function. Feels back to normal as well.   ? ?Past Medical History:  ?Diagnosis Date  ? B12 deficiency   ? Cancer of the skin, 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  ? CHF (congestive heart failure) ([place])   ? Diabetes mellitus without complication ([place])   ? History of prostate cancer   ? Hyperlipidemia   ? Hypertension   ? Hypoglycemia 05/06/2021  ? Iron deficiency anemia 02/20/2020  ? Leucocytosis   ? Leukemia, lymphocytic, chronic ([place])   ? ? ?Past Surgical History:  ?Procedure Laterality Date  ? PROSTATE BIOPSY    ? ? ?Family History  ?Problem Relation Age of Onset  ? Ovarian cancer Mother 32  ? Cancer Sister 24  ? ? ?Social History:  reports that he has quit smoking. He has never used smokeless tobacco. He reports that he does not currently use alcohol. He reports that he does not use drugs. ? ?Allergies: No Known Allergies ? ?Medications: I have reviewed the patient's current medications. ?No current facility-administered medications on file prior to encounter.  ? ?Current Outpatient Medications on File Prior to Encounter  ?Medication Sig Dispense Refill  ? amiodarone (PACERONE) 200 MG tablet Take 1 tablet by mouth daily.    ? ELIQUIS 5 MG TABS tablet Take 5 mg by mouth 2 (two) times daily.    ? metoprolol succinate (TOPROL-XL) 25 MG 24 hr tablet Take 25 mg by mouth at bedtime.    ? vismodegib (ERIVEDGE) 150 MG capsule Take 1 capsule by mouth daily.    ? Ascorbic Acid (VITAMIN C) 500 MG CAPS Take 500 mg by mouth daily.    ? benazepril (LOTENSIN) 20 MG tablet Take 20 mg by mouth  daily.    ? furosemide (LASIX) 40 MG tablet Take 40 mg by mouth 2 (two) times daily.    ? hydrochlorothiazide (MICROZIDE) 12.5 MG capsule Take 12.5 mg by mouth daily.    ? ketoconazole (NIZORAL) 2 % shampoo Apply topically 3 (three) times a week.    ? levothyroxine (SYNTHROID) 75 MCG tablet Take 75 mcg by mouth daily.    ? metFORMIN (GLUCOPHAGE-XR) 500 MG 24 hr tablet Take 500 mg by mouth 2 (two) times daily.    ? niacin 500 MG CR capsule Take by mouth.    ? Omega-3 Fatty Acids (FISH OIL) 1000 MG CAPS Take 2,000 mg by mouth daily.    ? rOPINIRole (REQUIP) 0.5 MG tablet Take 0.5 mg by mouth at bedtime. Take 1-2 before sleep    ? rosuvastatin (CRESTOR) 20 MG tablet Take 0.5 tablets by mouth daily.    ? vitamin B-12 (CYANOCOBALAMIN) 500 MCG tablet Take 500 mcg by mouth daily.    ? ? ?Results for orders placed or performed during the hospital encounter of 07/03/21 (from the past 48 hour(s))  ?Magnesium     Status: None  ? Collection Time: 07/04/21  3:24 AM  ?Result Value Ref Range  ? Magnesium 1.9 1.7 - 2.4  mg/dL  ?  Comment: Performed at [facility], [place] [address], [place], [place] [address]  ?CBC WITH DIFFERENTIAL     Status: Abnormal  ? Collection Time: 07/04/21  3:24 AM  ?Result Value Ref Range  ? WBC 136.0 (HH) 4.0 - 10.5 K/uL  ?  Comment: REPEATED TO VERIFY ?THIS CRITICAL RESULT HAS VERIFIED AND BEEN CALLED TO BLAINE,T. BY SEEL,MOLLY ON 03 05 2023 AT 0506, AND HAS BEEN READ BACK. CALLED '@04'$ :56. REPORT DELAYED DUE TO SLIDE CONFIRMATION. ?  ? RBC 3.43 (L) 4.22 - 5.81 MIL/uL  ? Hemoglobin 9.7 (L) 13.0 - 17.0 g/dL  ? HCT 32.5 (L) 39.0 - 52.0 %  ? MCV 94.8 80.0 - 100.0 fL  ? MCH 28.3 26.0 - 34.0 pg  ? MCHC 29.8 (L) 30.0 - 36.0 g/dL  ? RDW 16.7 (H) 11.5 - 15.5 %  ? Platelets 190 150 - 400 K/uL  ? nRBC 0.0 0.0 - 0.2 %  ? Neutrophils Relative % 3 %  ? Neutro Abs 4.1 1.7 - 7.7 K/uL  ? Lymphocytes Relative 96 %  ? Lymphs Abs 130.6 (H) 0.7 - 4.0 K/uL  ? Monocytes Relative 1 %  ? Monocytes Absolute 1.4  (H) 0.1 - 1.0 K/uL  ? Eosinophils Relative 0 %  ? Eosinophils Absolute 0.0 0.0 - 0.5 K/uL  ? Basophils Relative 0 %  ? Basophils Absolute 0.0 0.0 - 0.1 K/uL  ? nRBC HIDE 0 /100 WBC  ? Abs Immature Granulocytes 0.00 0.00 - 0.07 K/uL  ?  Comment: Performed at [facility], [place] [address], [place], [place] [address]  ?Comprehensive metabolic panel     Status: Abnormal  ? Collection Time: 07/04/21  3:24 AM  ?Result Value Ref Range  ? Sodium 140 135 - 145 mmol/L  ? Potassium 4.3 3.5 - 5.1 mmol/L  ? Chloride 108 98 - 111 mmol/L  ? CO2 25 22 - 32 mmol/L  ? Glucose, Bld 82 70 - 99 mg/dL  ?  Comment: Glucose reference range applies only to samples taken after fasting for at least 8 hours.  ? BUN 29 (H) 8 - 23 mg/dL  ? Creatinine, Ser 0.89 0.61 - 1.24 mg/dL  ? Calcium 7.9 (L) 8.9 - 10.3 mg/dL  ? Total Protein 5.5 (L) 6.5 - 8.1 g/dL  ? Albumin 2.9 (L) 3.5 - 5.0 g/dL  ? AST 36 15 - 41 U/L  ? ALT 32 0 - 44 U/L  ? Alkaline Phosphatase 58 38 - 126 U/L  ? Total Bilirubin 0.4 0.3 - 1.2 mg/dL  ? GFR, Estimated >60 >60 mL/min  ?  Comment: (NOTE) ?Calculated using the CKD-EPI Creatinine Equation (2021) ?  ? Anion gap 7 5 - 15  ?  Comment: Performed at [facility], [place] [address], [place], [place] [address]  ?Glucose, capillary     Status: None  ? Collection Time: 07/04/21  7:34 AM  ?Result Value Ref Range  ? Glucose-Capillary 80 70 - 99 mg/dL  ?  Comment: Glucose reference range applies only to samples taken after fasting for at least 8 hours.  ? ? ?No results found. ? ?Review of Systems  ?Gastrointestinal:  Positive for abdominal pain, nausea and vomiting.  ?All other systems reviewed and are negative. ?Blood pressure (!) 98/50, pulse (!) 59, temperature (!) 97.5 ?F (36.4 ?C), temperature source Oral, resp. rate 18, height 5' 10.5" (1.791 m), weight 63.5 kg, SpO2 96 %. ?Physical Exam ?Constitutional:   ?  Appearance: Normal appearance.  ?Abdominal:  ?   General: There is no distension.  ?    Palpations: Abdomen is soft.  ?   Tenderness: There is no abdominal tenderness.  ?   Comments: Well healed surgical scar ?  ?Neurological:  ?   Mental Status: He is alert.  ? ? ?Assessment/Plan: ?SBO, appears resolving already ?-xray pending but clinically he is soft and having bowel function ?-would hold doac today but this appears will not need surgery ?-clear liquids today, adat ?-fine for all other oral meds and pharm dvt prophylaxis ? ?I reviewed hospitalist notes, last 24 h vitals and pain scores, last 24 h labs and trends, and last 24 h imaging results. ? ?This care required low level of medical decision making.  ? ? ?Rolm Bookbinder ?07/04/2021, 8:18 AM  ? ? ? ? ?

## 2021-07-04 NOTE — Hospital Course (Addendum)
[name] was admitted to the hospital with the working diagnosis of small bowel obstruction.  ? ?Fernando Young is a 81 y.o. male with medical history significant for CLL, HFrEF (EF 25% by TTE 05/07/2021), paroxysmal A-fib/flutter on Eliquis and amiodarone, T2DM, HTN, HLD, iron deficiency anemia, hypothyroidism, multiple skin cancers (on cemiplimab and vismodegib daily) who is admitted from [facility] ED for management of SBO. ? ?Patient reported abdominal pain for 24 hours before ED visit, associated with nausea and vomiting. He has history of bowel perforation in the past sp repair, posteriorly having frequent episodes of bowel obstructions. He was evaluated in [facility] ED and diagnosed with partial bowel obstruction per CT, an NG tube was placed and patient was transferred to [facility]. On route he removed NG tube and at the time of his arrival he had a bowel movement. 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  ? ?Na 140, K 4,3, CL 108, bicarbonate 25, glucose 82, bun 29 and cr 0,89.  ?Wbc 136, hgb 9,7 hct 32.5 plt 190  ? ?Abdominal radiograph with single dilated loop of small bowel within the central abdomen compatible with either small bowel obstruction or ileus.  ? ?EKG 76 bpm, left axis deviation, left anterior fascicular bock, with right bundle branch block, sinus rhythm, with no significant ST segment or T wave changes.  ? ?Patient was placed on supportive medical therapy and surgery was consulted.  ?Patient continue to have bowel movements with clinical resolution of bowel obstruction.  ? ? ?

## 2021-07-04 NOTE — Assessment & Plan Note (Addendum)
Continue with levothyroxine  

## 2021-07-04 NOTE — Progress Notes (Signed)
Received call from Greta Doom, [place] from [place] ED/ lab about a blood culture result showing staph species in 1/4 culture bottles.  ?

## 2021-07-04 NOTE — Assessment & Plan Note (Addendum)
Follows with oncology, Dr. [name].  Not on active treatment.   ? ?No clinical signs of infection. ?No antibiotics were given.  ?[facility] ED had 1 out 4 blood culture positive for gram positive cocci, staph species per PCR, (personally called [facility] ED), possible contamination.  ?Patient has remained afebrile.  ? ?

## 2021-07-04 NOTE — Progress Notes (Signed)
Transported Patient to 57 via W/C with personal items.  Tolerated well.  ?

## 2021-07-04 NOTE — Assessment & Plan Note (Addendum)
EF was 25% on TTE 05/07/2021. ? ?Patient was placed on IV fluids with good toleration. ?No clinical signs of heart failure decompensation.  ?Patient will resume diuretic therapy, along with heart failure regimen with raas inhibition.  ?

## 2021-07-04 NOTE — Assessment & Plan Note (Addendum)
hgb and hct remained stable with hgb at 9,7 and hct at 32.5 ?Plan to continue follow up cell count as outpatient.  ?

## 2021-07-05 DIAGNOSIS — C9110 Chronic lymphocytic leukemia of B-cell type not having achieved remission: Secondary | ICD-10-CM

## 2021-07-05 DIAGNOSIS — D509 Iron deficiency anemia, unspecified: Secondary | ICD-10-CM

## 2021-07-05 DIAGNOSIS — E1159 Type 2 diabetes mellitus with other circulatory complications: Secondary | ICD-10-CM

## 2021-07-05 DIAGNOSIS — I480 Paroxysmal atrial fibrillation: Secondary | ICD-10-CM

## 2021-07-05 DIAGNOSIS — I5020 Unspecified systolic (congestive) heart failure: Secondary | ICD-10-CM

## 2021-07-05 DIAGNOSIS — E039 Hypothyroidism, unspecified: Secondary | ICD-10-CM

## 2021-07-05 LAB — GLUCOSE, CAPILLARY: Glucose-Capillary: 110 mg/dL — ABNORMAL HIGH (ref 70–99)

## 2021-07-05 NOTE — Progress Notes (Signed)
NUTRITION NOTE ? ?Patient screened for MST. He was admitted 3/4 with small bowel obstruction. Diet advanced to CLD yesterday AM, to FLD and then to Soft yesterday afternoon. No meal intakes documented. ? ?Discharge order and discharge summary for d/c home entered earlier this AM. Patient discharging to home. ? ?Placed "Low Fiber Nutrition Therapy" handout from the [employer] of [employer] Diet[employer] in AVS.  ? ? ? ? ?Fernando Matin, MS, RD, LDN ?Inpatient Clinical Dietitian ?RD pager # available in [place]  ?After hours/weekend pager # available in [place] ? ?

## 2021-07-05 NOTE — Assessment & Plan Note (Signed)
His blood pressure remained well controlled.  ?At the time of discharge will resume benazepril and diuretic therapy.  ?

## 2021-07-05 NOTE — Discharge Summary (Addendum)
Physician Discharge Summary   Patient: Fernando Young MRN: [id number] DOB: October 21, 1940  Admit date:     07/03/2021  Discharge date: 07/05/21  Discharge Physician: Fernando Young   PCP: Fernando Bott, MD   Recommendations at discharge:   Follow up with Fernando Young in 7 to 10 days.  Continue current medical therapy.   I spoke over the phone with the patient's daughter about patient's  condition, plan of care, prognosis and all questions were addressed.   Discharge Diagnoses: Principal Problem:   SBO (small bowel obstruction) (HCC) Active Problems:   CLL (chronic lymphocytic leukemia) (HCC)   HFrEF (heart failure with reduced ejection fraction) (HCC)   Paroxysmal atrial fibrillation (HCC)   Iron deficiency anemia   Type 2 diabetes mellitus with vascular disease (HCC)   Acquired hypothyroidism   Cancer of the skin, basal cell   Hyperlipidemia associated with type 2 diabetes mellitus ([place])  Resolved Problems:   * No resolved hospital problems. [facility] Course: Fernando Young was admitted to the hospital with the working diagnosis of small bowel obstruction.   Fernando Young is a 81 y.o. male with medical history significant for CLL, HFrEF (EF 25% by TTE 05/07/2021), paroxysmal A-fib/flutter on Eliquis and amiodarone, T2DM, HTN, HLD, iron deficiency anemia, hypothyroidism, multiple skin cancers (on cemiplimab and vismodegib daily) who is admitted from [facility] ED for management of SBO.  Patient reported abdominal pain for 24 hours before ED visit, associated with nausea and vomiting. He has history of bowel perforation in the past sp repair, posteriorly having frequent episodes of bowel obstructions. He was evaluated in [facility] ED and diagnosed with partial bowel obstruction per CT, an NG tube was placed and patient was transferred to [facility]. On route he removed NG tube and at the time of his arrival he had a bowel movement. His blood pressure was 141/73, HR 67, RR 18, temp 97.6  and 02 saturation 99%, lungs clear to auscultation, heart with S1 and S2 present and rhythmic, abdomen soft and non tender, large inguinal hernia on the left, no lower extremity edema.   Na 140, K 4,3, CL 108, bicarbonate 25, glucose 82, bun 29 and cr 0,89.  Wbc 136, hgb 9,7 hct 32.5 plt 190   Abdominal radiograph with single dilated loop of small bowel within the central abdomen compatible with either small bowel obstruction or ileus.   EKG 76 bpm, left axis deviation, left anterior fascicular bock, with right bundle branch block, sinus rhythm, with no significant ST segment or T wave changes.   Patient was placed on supportive medical therapy and surgery was consulted.  Patient continue to have bowel movements with clinical resolution of bowel obstruction.     Assessment and Plan: * SBO (small bowel obstruction) (HCC) CT imaging at outside hospital suggestive of at least partial small bowel obstruction.  NG tube self removed.  He had a bowel movement on arrival.  He denies any further nausea, vomiting, or abdominal pain.   Patient was placed on supportive medical therapy with improvement of his symptoms. Hid diet was advanced with good toleration.  At the time of his discharge he had no nausea or vomiting, no abdominal pain and positive bowel movement.  Follow up as outpatient.   CLL (chronic lymphocytic leukemia) ([place]) Follows with oncology, Fernando Young.  Not on active treatment.    No clinical signs of infection. No antibiotics were given.  [facility] ED had 1 out 4 blood culture positive for  gram positive cocci, staph species per PCR, (personally called [facility] ED), possible contamination.  Patient has remained afebrile.    HFrEF (heart failure with reduced ejection fraction) (HCC) EF was 25% on TTE 05/07/2021.  Patient was placed on IV fluids with good toleration. No clinical signs of heart failure decompensation.  Patient will resume diuretic therapy, along with  heart failure regimen with raas inhibition.   Paroxysmal atrial fibrillation (HCC) Patient remained rate control and sinus rhythm during his hospitalization.   Plan to continue with apixaban for anticoagulation, along with amiodarone for rate and rhythm control.   Type 2 diabetes mellitus with vascular disease ([place]) Patient was placed on insulin sliding scale for glucose cover and monitoring. His glucose remained well controlled, at the time of his discharge was 110 mg/dl (fasting).  At discharge will resume metformin.   Iron deficiency anemia hgb and hct remained stable with hgb at 9,7 and hct at 32.5 Plan to continue follow up cell count as outpatient.   Acquired hypothyroidism Continue with levothyroxine.   Cancer of the skin, basal cell Follows with [facility] dermatology for management of nonmelanoma skin cancer.  On active treatment with vismodegib.   Hyperlipidemia associated with type 2 diabetes mellitus ([place]) Continue statin therapy.   Essential hypertension His blood pressure remained well controlled.  At the time of discharge will resume benazepril and diuretic therapy.          Consultants: surgery  Procedures performed: none   Disposition:  home  Diet recommendation:  Cardiac diet DISCHARGE MEDICATION: Allergies as of 07/05/2021   No Known Allergies      Medication List     TAKE these medications    amiodarone 200 MG tablet Commonly known as: PACERONE Take 200 mg by mouth daily.   benazepril 20 MG tablet Commonly known as: LOTENSIN Take 20 mg by mouth daily.   cholecalciferol 25 MCG (1000 UNIT) tablet Commonly known as: VITAMIN D3 Take 1,000 Units by mouth daily.   CORICIDIN HBP CONGESTION/COUGH PO Take 1 tablet by mouth every 12 (twelve) hours as needed (cold symptoms).   Eliquis 5 MG Tabs tablet Generic drug: apixaban Take 5 mg by mouth 2 (two) times daily.   Fish Oil 1000 MG Caps Take 1,000 mg by mouth daily.   furosemide 20 MG  tablet Commonly known as: LASIX Take 20 mg by mouth 2 (two) times daily.   ibuprofen 200 MG tablet Commonly known as: ADVIL Take 200 mg by mouth daily as needed for mild pain.   ketoconazole 2 % shampoo Commonly known as: NIZORAL Apply topically 3 (three) times a week.   levothyroxine 75 MCG tablet Commonly known as: SYNTHROID Take 75 mcg by mouth daily.   metFORMIN 500 MG 24 hr tablet Commonly known as: GLUCOPHAGE-XR Take 500 mg by mouth 2 (two) times daily.   rOPINIRole 0.5 MG tablet Commonly known as: REQUIP Take 0.5-1 mg by mouth See admin instructions. Taking one tablet at bedtime, if no relief take an additional tablet   rosuvastatin 20 MG tablet Commonly known as: CRESTOR Take 10 mg by mouth daily.   vismodegib 150 MG capsule Commonly known as: ERIVEDGE Take 150 mg by mouth daily.   vitamin B-12 500 MCG tablet Commonly known as: CYANOCOBALAMIN Take 500 mcg by mouth daily.   Vitamin C 500 MG Caps Take 500 mg by mouth daily.        Follow-up Information     Fernando Bott, MD Follow up in 1 week(s).  Specialty: Internal Medicine Contact information: [address] [place] [address] [phone]                Discharge Exam: Danley Danker Weights   07/03/21 2200  Weight: 63.5 kg   BP 117/63 (BP Location: Right Arm)    Pulse 60    Temp 98.5 F (36.9 C) (Oral)    Resp 20    Ht 5' 10.5" (1.791 m)    Wt 63.5 kg    SpO2 95%    BMI 19.80 kg/m   Neurology awake and alert ENT with no pallor or icterus Cardiovascular with S1 and S2 present and rhythmic with no gallops, rubs or murmurs No lower extremity edema Respiratory with no wheezing or rales Abdomen soft and non tender, positive left inguinal hernia.   Condition at discharge: stable  The results of significant diagnostics from this hospitalization (including imaging, microbiology, ancillary and laboratory) are listed below for reference.   Imaging Studies: DG Abd 1 View  Result  Date: 07/04/2021 CLINICAL DATA:  Evaluate for small bowel obstruction. EXAM: ABDOMEN - 1 VIEW COMPARISON:  None FINDINGS: There is a single dilated loop of small bowel within the central abdomen measuring up to 4.5 cm. Decreased colonic gas noted. No abnormal abdominal or pelvic calcifications. IMPRESSION: Single dilated loop of small bowel within the central abdomen compatible with either small bowel obstruction or ileus. Electronically Signed   By: Kerby Moors M.D.   On: 07/04/2021 08:27    Microbiology: No results found for this or any previous visit.  Labs: CBC: Recent Labs  Lab 07/04/21 0324  WBC 136.0*  NEUTROABS 4.1  HGB 9.7*  HCT 32.5*  MCV 94.8  PLT 284   Basic Metabolic Panel: Recent Labs  Lab 07/04/21 0324  NA 140  K 4.3  CL 108  CO2 25  GLUCOSE 82  BUN 29*  CREATININE 0.89  CALCIUM 7.9*  MG 1.9   Liver Function Tests: Recent Labs  Lab 07/04/21 0324  AST 36  ALT 32  ALKPHOS 58  BILITOT 0.4  PROT 5.5*  ALBUMIN 2.9*   CBG: Recent Labs  Lab 07/04/21 0734 07/04/21 1121 07/04/21 1633 07/05/21 0737  GLUCAP 80 147* 161* 110*    Discharge time spent: greater than 30 minutes.  Signed: Tawni Millers, MD [facility] 07/05/2021

## 2021-07-05 NOTE — Discharge Instructions (Signed)

## 2021-07-05 NOTE — TOC Progression Note (Signed)
Transition of Care (TOC) - Progression Note  ? ? ?Patient Details  ?Name: Fernando Young ?MRN: [id number] ?Date of Birth: [birth year] ? ?Transition of Care (TOC) CM/SW Contact  ?Purcell Mouton, RN ?Phone Number: ?07/05/2021, 12:02 PM ? ?Clinical Narrative:    ?A call to four different [facility] was made for HHPT. [facility] will follow pt at home.  ? ? ?  ?  ? ?Expected Discharge Plan and Services ?  ?  ?  ?  ?  ?Expected Discharge Date: 07/05/21               ?  ?  ?  ?  ?  ?  ?  ?  ?  ?  ? ? ?Social Determinants of Health (SDOH) Interventions ?  ? ?Readmission Risk Interventions ?No flowsheet data found. ? ?

## 2021-07-05 NOTE — Evaluation (Signed)
Physical Therapy Evaluation ?Patient Details ?Name: Fernando Young ?MRN: [id number] ?DOB: [birth year] ?Today's Date: 07/05/2021 ? ?History of Present Illness ? 81 yo male with prior h/o  perforated bowel many years ago.  He has pmh of cll, chf, afib on eliquis who presented to an outside er with ab pain, n/v.  He had a ct there by report and was found to have a partial small bowel obstruction.  ?Clinical Impression ? Pt ambulated 140' with RW, no loss of balance. He was independent with bed mobility and transfers. He does report having several falls at home in the past year, most recent fall was 1 month ago. HHPT recommended for balance training and home safety evaluation. Noted plan is to DC home today.  ?   ? ?Recommendations for follow up therapy are one component of a multi-disciplinary discharge planning process, led by the attending physician.  Recommendations may be updated based on patient status, additional functional criteria and insurance authorization. ? ?Follow Up Recommendations Home health PT ? ?  ?Assistance Recommended at Discharge Set up Supervision/Assistance  ?Patient can return home with the following ? A little help with bathing/dressing/bathroom;Assist for transportation;Help with stairs or ramp for entrance ? ?  ?Equipment Recommendations None recommended by PT  ?Recommendations for Other Services ?    ?  ?Functional Status Assessment Patient has not had a recent decline in their functional status  ? ?  ?Precautions / Restrictions Precautions ?Precautions: Fall ?Precaution Comments: pt reports several falls in past 1 year, most recent was 1 month ago ?Restrictions ?Weight Bearing Restrictions: No  ? ?  ? ?Mobility ? Bed Mobility ?Overal bed mobility: Modified Independent ?  ?  ?  ?  ?  ?  ?General bed mobility comments: used bedrail ?  ? ?Transfers ?Overall transfer level: Needs assistance ?Equipment used: Rolling walker (2 wheels) ?Transfers: Sit to/from Stand ?Sit to Stand: Modified independent  (Device/Increase time) ?  ?  ?  ?  ?  ?  ?  ? ?Ambulation/Gait ?Ambulation/Gait assistance: Modified independent (Device/Increase time) ?Gait Distance (Feet): 140 Feet ?Assistive device: Rolling walker (2 wheels) ?Gait Pattern/deviations: Step-through pattern, Decreased stride length ?  ?  ?  ?General Gait Details: steady with RW, no loss of balance ? ?Stairs ?  ?  ?  ?  ?  ? ?Wheelchair Mobility ?  ? ?Modified Rankin (Stroke Patients Only) ?  ? ?  ? ?Balance Overall balance assessment: History of Falls ?  ?  ?  ?  ?  ?  ?  ?  ?  ?  ?  ?  ?  ?  ?  ?  ?  ?  ?   ? ? ? ?Pertinent Vitals/Pain Pain Assessment ?Pain Assessment: No/denies pain  ? ? ?Home Living Family/patient expects to be discharged to:: Private residence ?Living Arrangements: Children ?Available Help at Discharge: Family;Available PRN/intermittently ?  ?Home Access: Stairs to enter ?Entrance Stairs-Rails: Can reach both;Left;Right ?Entrance Stairs-Number of Steps: 2 ?  ?  ?Home Equipment: Rollator (4 wheels);Cane - single point ?Additional Comments: son stays with him at night, daughter is there in afternoons  ?  ?Prior Function Prior Level of Function : Independent/Modified Independent;Driving ?  ?  ?  ?  ?  ?  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  ? ? ?Hand Dominance  ?   ? ?  ?Extremity/Trunk Assessment  ?  Upper Extremity Assessment ?Upper Extremity Assessment: Overall WFL for tasks assessed ?  ? ?Lower Extremity Assessment ?Lower Extremity Assessment: Overall WFL for tasks assessed ?  ? ?Cervical / Trunk Assessment ?Cervical / Trunk Assessment: Kyphotic  ?Communication  ? Communication: No difficulties  ?Cognition Arousal/Alertness: Awake/alert ?Behavior During Therapy: [facility] for tasks assessed/performed ?Overall Cognitive Status: Within Functional Limits for tasks assessed ?  ?  ?  ?  ?  ?  ?  ?  ?  ?  ?  ?  ?  ?  ?  ?  ?  ?  ?   ? ?  ?General Comments   ? ?  ?Exercises    ? ?Assessment/Plan  ?  ?PT Assessment All further PT needs can be met in the next venue of care  ?PT Problem List Decreased balance ? ?   ?  ?PT Treatment Interventions     ? 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 ?  ?Frequency   ?  ? ? ?Co-evaluation   ?  ?  ?  ?  ? ? ?  ?AM-PAC PT "6 Clicks" Mobility  ?Outcome Measure Help needed turning from your back to your side while in a flat bed without using bedrails?: None ?Help needed moving from lying on your back to sitting on the side of a flat bed without using bedrails?: None ?Help needed moving to and from a bed to a chair (including a wheelchair)?: None ?Help needed standing up from a chair using your arms (e.g., wheelchair or bedside chair)?: None ?Help needed to walk in hospital room?: None ?Help needed climbing 3-5 steps with a railing? : A Little ?6 Click Score: 23 ? ?  ?End of Session Equipment Utilized During Treatment: Gait belt ?Activity Tolerance: Patient tolerated treatment well ?Patient left: in chair;with call bell/phone within reach;with chair alarm set ?Nurse Communication: Mobility status ?PT Visit Diagnosis: History of falling (Z91.81) ?  ? ?Time: 3582-5189 ?PT Time Calculation (min) (ACUTE ONLY): 25 min ? ? ?Charges:   PT Evaluation ?$PT Eval Low Complexity: 1 Low ?PT Treatments ?$Gait Training: 8-22 mins ?  ?   ? ?Blondell Reveal Kistler PT 07/05/2021  ?Acute Rehabilitation Services ?Pager [phone] ?Office [phone] ? ? ?

## 2021-07-05 NOTE — Progress Notes (Signed)
? ? ?   ?  Subjective: ?Patient doing well.  No nausea.  Tolerating his diet.  BM yesterday.  No pain ? ?ROS: See above, otherwise other systems negative ? ?Objective: ?Vital signs in last 24 hours: ?Temp:  [97.6 ?F (36.4 ?C)-98.5 ?F (36.9 ?C)] 98.5 ?F (36.9 ?C) (03/06 0443) ?Pulse Rate:  [60-63] 60 (03/06 0443) ?Resp:  [16-22] 20 (03/06 0443) ?BP: (117-135)/(63-77) 117/63 (03/06 0443) ?SpO2:  [95 %-100 %] 95 % (03/06 0443) ?Last BM Date : 07/04/21 ? ?Intake/Output from previous day: ?No intake/output data recorded. ?Intake/Output this shift: ?No intake/output data recorded. ? ?PE: ?Abd: soft, NT, ND, +BS ? ?Lab Results:  ?Recent Labs  ?  07/04/21 ?2641  ?WBC 136.0*  ?HGB 9.7*  ?HCT 32.5*  ?PLT 190  ? ?BMET ?Recent Labs  ?  07/04/21 ?0324  ?NA 140  ?K 4.3  ?CL 108  ?CO2 25  ?GLUCOSE 82  ?BUN 29*  ?CREATININE 0.89  ?CALCIUM 7.9*  ? ?PT/INR ?No results for input(s): LABPROT, INR in the last 72 hours. ?CMP  ?   ?Component Value Date/Time  ? NA 140 07/04/2021 0324  ? NA 137 06/04/2021 0000  ? NA 140 06/04/2021 0000  ? K 4.3 07/04/2021 0324  ? CL 108 07/04/2021 0324  ? CO2 25 07/04/2021 0324  ? GLUCOSE 82 07/04/2021 0324  ? BUN 29 (H) 07/04/2021 0324  ? BUN 31 (A) 06/04/2021 0000  ? BUN 30 (A) 06/04/2021 0000  ? CREATININE 0.89 07/04/2021 0324  ? CALCIUM 7.9 (L) 07/04/2021 0324  ? PROT 5.5 (L) 07/04/2021 0324  ? PROT 5.2 11/04/2020 0000  ? ALBUMIN 2.9 (L) 07/04/2021 0324  ? AST 36 07/04/2021 0324  ? ALT 32 07/04/2021 0324  ? ALKPHOS 58 07/04/2021 0324  ? BILITOT 0.4 07/04/2021 0324  ? GFRNONAA >60 07/04/2021 0324  ? ?Lipase  ?No results found for: LIPASE ? ? ? ? ?Studies/Results: ?DG Abd 1 View ? ?Result Date: 07/04/2021 ?CLINICAL DATA:  Evaluate for small bowel obstruction. EXAM: ABDOMEN - 1 VIEW COMPARISON:  None FINDINGS: There is a single dilated loop of small bowel within the central abdomen measuring up to 4.5 cm. Decreased colonic gas noted. No abnormal abdominal or pelvic calcifications. IMPRESSION: Single dilated  loop of small bowel within the central abdomen compatible with either small bowel obstruction or ileus. Electronically Signed   By: Kerby Moors M.D.   On: 07/04/2021 08:27   ? ?Anti-infectives: ?Anti-infectives (From admission, onward)  ? ? None  ? ?  ? ? ? ?Assessment/Plan ?SBO ?-tolerating soft diet ?-continues to have bowel function ?-surgically stable for DC home  ? ?FEN - soft diet ?VTE - ok for prophylaxis ?ID - none needed ? ?Straightforward Medical Decision Making ?I reviewed hospitalist notes, last 24 h vitals and pain scores, last 24 h labs and trends, and last 24 h imaging results. ? ? LOS: 2 days  ? ? ?Henreitta Cea , PA-C ?[facility] ?07/05/2021, 9:28 AM ?Please see Amion for pager number during day hours 7:00am-4:30pm or 7:00am -11:30am on weekends ? ?

## 2021-07-15 ENCOUNTER — Telehealth: Payer: Self-pay | Admitting: Dietician

## 2021-07-15 NOTE — Telephone Encounter (Signed)
Called patient at his home in response to patient's MST during hospitalization for SBO.  Weight loss 14# (9%) in one month.  ?He reports he has good appetite. Denies and n/v/d/c. No abdominal pain or concerns at this time.  He reports weight was 150 last week, and he hasn't noticed any changes in clothing or how it fits. He said he got a bunch of papers when he left the hospital but he has not gone through them. ? ?He is eating his usual diet that includes: ? ?B: Bran flakes cereal, with milk blueberries, blackberries, or strawberries ?L: Goes out eat a lot (burgers green beans, peas) ?D: Daughter come to fix food ?chicken, beef, with gravy, tater tots ?sometimes cookie for desert ? ?Fluids include: green tea, water reports at least quart of water daily. ? ?Will continue to monitor weight trends. He has follow up with Dr. Hinton Rao 08/05/21.  ? ?April Manson, RDN, LDN ?Registered Dietitian, [place] ?Part Time Remote (Usual office hours: Tuesday-Thursday) ?Cell: [phone]   ?

## 2021-08-04 NOTE — Progress Notes (Signed)
?[facility]  ?[address] ?[place],  [place]  [address] ?[phone] ? ?Clinic Day:  08/05/21 ? ?Referring physician: Raelene Bott, MD ? ?ASSESSMENT & PLAN:  ? ?Assessment & Plan: ?1.  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 However, he had not had problems with splenomegaly, adenopathy, or thrombocytopenia, but his anemia has only partially improved with iron replacement, so may be partially from his CLL.  As he is fairly stable, it is not urgent that he start treatment at this time. We plan acalabrutinib if he needs to start treatment, and his worsening lymphocytosis and anemia may make this necessary. I will need to check if there is any negative interaction with his medications, including the vismodegib, or possibly Libtayo. ?  ?2.  Anemia, persistent and worsening. He last received IV Feraheme in July.  ?  ?3.  Hyperkalemia, which is chronic, but normal today with the medication from Dr. Heber Ellis.   ?  ?4.   Mild clinical dehydration, he knows to push clear fluids. ?  ?5.  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  This is a reported adverse effect and so we may need to change him to [place].  His daughter questions why we would do that if this medication is working and so I will research this information further before deciding. ? ?His white blood count is even worse today, so we will consider treatment with a acalabrutinib and I have discussed this briefly with them.  We discussed whether to switch him from Erivedge to [place] but I will research more information about this.  Otherwise, we will see him back in 2 weeks with CBC and CMP for repeat evaluation and make decisions at that time.  That we will give [place]  a chance to see what his lymphocytosis does.  I explained I would prefer to start 1 new medication at a time rather than change everything all at once.  We will also plan to repeat iron studies when he returns since his hemoglobin is dropping again. The patient and his daughter understand the plans discussed today and are in agreement with them. They know to contact our office if he develops concerns prior to his next appointment. ? ? ? ?[facility] ?[address] ?[place] [address] ?Dept: [phone] ?Dept Fax: [phone]  ? ? ? ? ?CHIEF COMPLAINT:  ?CC: Chronic lymphocytic leukemia ? ?Current Treatment: Observation ? ? ?HISTORY OF PRESENT ILLNESS:  ?Fernando Young is a 81 y.o. 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  The CLL was confirmed by flow cytometry in July 2017.  He has had slow progression of his lymphocytosis over time, with mild anemia, but has remained on observation.  He has multiple medical comorbidities including diabetes, congestive heart failure, B12 deficiency, cardiac arrhythmias, and history of prostate cancer. The white blood cell count has steadily increased over the years.  He was advised to start therapy years ago but we have opted for observation and he has been largely asymptomatic until now. ? ?In October 2021, he was found to have iron deficiency despite oral iron supplementation. B12 and folate levels were  normal.  He received IV iron in form of Feraheme. Oral iron was discontinued, as we did not feel he was absorbing it. Oral B12 was continued.  His hemoglobin improved but did not normalize with the IV iron.  He received IV iron again in July 2022 when his hemoglobin was down to 9.8. The white blood cell count has been running between 100,000 and 116,000 for the past year.  He has been relatively asymptomatic. He was diagnosed with atrial  fibrillation.  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 We have had multiple discussions with the patient and his daughter regarding the risk and benefits of treatment for his CLL and have continued observation. ? ?INTERVAL HISTORY:  ?Fernando Young is here for routine follow up and states that he is doing fair.  He complains of generalized weakness. Otherwise, he denies complaints. He is taking Erivedge 150 mg daily for the skin cancers of his face.  It seems to be possibly helping, and he has not had severe toxicities.  However I was contacted by his [facility] dermatologist, Dr. Tyson Alias, and he is suggesting that the patient be changed to Libtayo, especially since he has developed a large new aggressive squamous cell carcinoma while on the oral therapy.  The skin cancers of his face and scalp are beyond surgical resection at this time due to the widespread extension.  His white count has increased from 136,000 to 161,200, and his hemoglobin has decreased from 9.7 to 9.5, and platelets are normal. Chemistries are remarkable for a normal potassium, a BUN of 28, and low total protein of 5.3 with albumin of 3.3. His  appetite is fair, but he has lost 9 pounds since his last visit.  He denies fever, chills or other signs of infection.  He denies nausea, vomiting, bowel issues, or abdominal pain.  He denies sore throat, cough, dyspnea, or chest pain. He is accompanied by his daughter today as usual. ? ?REVIEW OF SYSTEMS:  ?Review of Systems  ?Constitutional:  Negative for appetite change, chills, fatigue, fever and unexpected weight change.  ?HENT:  Negative.    ?Eyes: Negative.   ?Respiratory: Negative.  Negative for chest tightness, cough, hemoptysis, shortness of breath and wheezing.   ?Cardiovascular: Negative.  Negative for chest pain, leg swelling and  palpitations.  ?Gastrointestinal: Negative.  Negative for abdominal distention, abdominal pain, blood in stool, constipation, diarrhea, nausea and vomiting.  ?Endocrine: Negative.   ?Genitourinary: Negative.  Negative for difficulty urinating, dysuria, frequency and hematuria.   ?Musculoskeletal:  Negative for arthralgias, back pain, flank pain, gait problem and myalgias.  ?Skin: Negative.   ?     Extensive skin cancers of the face  ?Neurological:  Positive for extremity weakness (in a wheelchair today). Negative for dizziness, gait problem, headaches, light-headedness, numbness, seizures and speech difficulty.  ?Hematological: Negative.   ?Psychiatric/Behavioral: Negative.  Negative for depression and sleep disturbance. The patient is not nervous/anxious.   ?All other systems reviewed and are negative.  ? ?VITALS:  ?Blood pressure (!) 97/54, pulse 66, temperature (!) 97.4 ?F (36.3 ?C), temperature source Oral, resp. rate 19, height 5' 10.5" (1.791 m), weight 144 lb 12.8 oz (65.7 kg), SpO2 98 %.  ?Wt Readings from Last 3 Encounters:  ?08/05/21 144 lb 12.8 oz (65.7 kg)  ?07/03/21 139 lb 15.9 oz (63.5 kg)  ?06/04/21 154 lb 1.6  oz (69.9 kg)  ?  ?Body mass index is 20.48 kg/m?. ? ?Performance status (ECOG): 2 - Symptomatic, <50% confined to bed ? ?PHYSICAL EXAM:  ?Physical Exam ?Vitals and nursing note reviewed.  ?Constitutional:   ?   General: He is not in acute distress. ?   Appearance: Normal appearance. He is normal weight.  ?HENT:  ?   Head: Normocephalic and atraumatic.  ?   Mouth/Throat:  ?   Mouth: Mucous membranes are moist.  ?   Pharynx: Oropharynx is clear. No oropharyngeal exudate or posterior oropharyngeal erythema.  ?Eyes:  ?   General: No scleral icterus. ?   Extraocular Movements: Extraocular movements intact.  ?   Conjunctiva/sclera: Conjunctivae normal.  ?   Pupils: Pupils are equal, round, and reactive to light.  ?Cardiovascular:  ?   Rate and Rhythm: Normal rate and regular rhythm.  ?   Heart sounds:  Heart sounds not distant. No murmur heard. ?  No friction rub. No gallop.  ?Pulmonary:  ?   Effort: Pulmonary effort is normal.  ?   Breath sounds: Normal breath sounds. No wheezing, rhonchi or ral

## 2021-08-05 ENCOUNTER — Other Ambulatory Visit: Payer: Self-pay

## 2021-08-05 ENCOUNTER — Encounter: Payer: Self-pay | Admitting: Oncology

## 2021-08-05 ENCOUNTER — Other Ambulatory Visit: Payer: Self-pay | Admitting: Oncology

## 2021-08-05 ENCOUNTER — Inpatient Hospital Stay: Payer: Medicare Other

## 2021-08-05 ENCOUNTER — Inpatient Hospital Stay: Payer: Medicare Other | Attending: Oncology | Admitting: Oncology

## 2021-08-05 VITALS — BP 97/54 | HR 66 | Temp 97.4°F | Resp 19 | Ht 70.5 in | Wt 144.8 lb

## 2021-08-05 DIAGNOSIS — C9110 Chronic lymphocytic leukemia of B-cell type not having achieved remission: Secondary | ICD-10-CM

## 2021-08-05 DIAGNOSIS — D509 Iron deficiency anemia, unspecified: Secondary | ICD-10-CM

## 2021-08-05 DIAGNOSIS — C4491 Basal cell carcinoma of skin, unspecified: Secondary | ICD-10-CM

## 2021-08-05 DIAGNOSIS — Z79899 Other long term (current) drug therapy: Secondary | ICD-10-CM | POA: Insufficient documentation

## 2021-08-05 LAB — HEPATIC FUNCTION PANEL
ALT: 27 U/L (ref 10–40)
AST: 27 (ref 14–40)
Alkaline Phosphatase: 62 (ref 25–125)
Bilirubin, Total: 0.4

## 2021-08-05 LAB — BASIC METABOLIC PANEL
BUN: 28 — AB (ref 4–21)
CO2: 28 — AB (ref 13–22)
Chloride: 107 (ref 99–108)
Creatinine: 1.2 (ref 0.6–1.3)
Glucose: 142
Potassium: 3.8 mEq/L (ref 3.5–5.1)
Sodium: 141 (ref 137–147)

## 2021-08-05 LAB — CBC AND DIFFERENTIAL
HCT: 32 — AB (ref 41–53)
Hemoglobin: 9.5 — AB (ref 13.5–17.5)
Neutrophils Absolute: 8.06
Platelets: 206 10*3/uL (ref 150–400)
WBC: 161.2

## 2021-08-05 LAB — COMPREHENSIVE METABOLIC PANEL
Albumin: 3.3 — AB (ref 3.5–5.0)
Calcium: 7.9 — AB (ref 8.7–10.7)

## 2021-08-05 LAB — CBC: RBC: 3.52 — AB (ref 3.87–5.11)

## 2021-08-18 NOTE — Progress Notes (Signed)
?Fernando Young  ?[address] ?[place],  [place]  [address] ?[phone] ? ?Clinic Day:  08/19/21 ? ?Referring physician: Raelene Bott, MD ? ?ASSESSMENT & PLAN:  ? ?Assessment & Plan: ?1.  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 However, he had not had problems with splenomegaly, adenopathy, or thrombocytopenia, but his anemia has only partially improved with iron replacement, so may be more from his CLL.  As he is fairly stable, it is not urgent that he start treatment at this time. We plan acalabrutinib if he needs to start treatment, and his worsening lymphocytosis and anemia may make this necessary ?  ?2.  Anemia, persistent and worsening. He last received IV Feraheme in July.  ?  ?3.  Hyperkalemia, which is chronic, but normal today with the medication from Dr. Heber [place].   ?  ?4.  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  This is a reported adverse effect and so we need to change him to [place]. I explained this issue to the patient and his daughter. ? ?His white blood count is even worse today, so we will consider treatment with a acalabrutinib and I have discussed this briefly with them.  However we wish to make 1 change at a time so I feel the priority is to switch him from [place] to [place].  There are no negative interactions with his current medications and he will meet with the nurse practitioner to go over the potential toxicities.  Once he is stable on that medication, we will plan to initiate a acalabrutinib.  Otherwise, we will see him back in 3-4 weeks with CBC and CMP to see how he is tolerating the new medication.  I explained I would prefer to start 1 new medication at a time rather than change  everything all at once.  We will also plan to repeat iron studies when he returns since his hemoglobin is dropping again. The patient and his daughter understand the plans discussed today and are in agreement with them. They know to contact our office if he develops concerns prior to his next appointment. ? ? ? ?[address] ?[place] ?[place] [address] ?Dept: [phone] ?Dept Fax: [phone]  ? ? ? ? ?CHIEF COMPLAINT:  ?CC: Chronic lymphocytic leukemia, unresectable skin cancers of the scalp and face ? ?Current Treatment: Libtayo ? ? ?HISTORY OF PRESENT ILLNESS:  ?Fernando Young is a 81 y.o. 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  The CLL was confirmed by flow cytometry in July 2017.  He has had slow progression of his lymphocytosis over time, with mild anemia, but has remained on observation.  He has multiple medical comorbidities including diabetes, congestive heart failure, B12 deficiency, cardiac arrhythmias, and history of prostate cancer. The white blood cell count has steadily increased over the years.  He was advised to start therapy years ago but we have opted for observation and he has been largely asymptomatic until now. ? ?In October 2021, he was found to have iron deficiency despite oral iron supplementation. B12 and folate levels were normal.  He received IV iron in form of Feraheme. Oral  iron was discontinued, as we did not feel he was absorbing it. Oral B12 was continued.  His hemoglobin improved but did not normalize with the IV iron.  He received IV iron again in July 2022 when his hemoglobin was down to 9.8. The white blood cell count has been running between 100,000 and 116,000 for the past year.  He has been relatively asymptomatic. He was diagnosed with atrial fibrillation.  He has undergone multiple skin cancer surgeries for both basal cell  carcinoma and squamous cell carcinoma, and had seen a dermatology specialist at [facility], and has been taking Erivedge oral chemotherapy. ?INTERVAL HISTORY:  ?Fernando Young is here for routine follow up and states that he is doing fair.  He complains of generalized weakness. Otherwise, he denies complaints. He is taking Erivedge 150 mg daily for the skin cancers of his face.  It seems to be possibly helping, and he has not had severe toxicities.  However I was contacted by his [facility] dermatologist, Dr. Tyson Alias, and he is suggesting that the patient be changed to Libtayo, especially since he has developed a large new aggressive squamous cell carcinoma while on the oral therapy.  The skin cancers of his face and scalp are beyond surgical resection at this time due to the widespread extension.  His white count has decreased from 161,200 243,000 with an ALC of 130,000, and his hemoglobin has decreased to 9.4, and platelets are normal. Chemistries are unremarkable. His  appetite is fair, and he has gained 3 pounds since his last visit.  He denies fever, chills or other signs of infection.  He denies nausea, vomiting, bowel issues, or abdominal pain.  He denies sore throat, cough, dyspnea, or chest pain. He is accompanied by his daughter today as usual. ? ?REVIEW OF SYSTEMS:  ?Review of Systems  ?Constitutional:  Negative for appetite change, chills, fatigue, fever and unexpected weight change.  ?HENT:  Negative.    ?Eyes: Negative.   ?Respiratory: Negative.  Negative for chest tightness, cough, hemoptysis, shortness of breath and wheezing.   ?Cardiovascular: Negative.  Negative for chest pain, leg swelling and palpitations.  ?Gastrointestinal: Negative.  Negative for abdominal distention, abdominal pain, blood in stool, constipation, diarrhea, nausea and vomiting.  ?Endocrine: Negative.   ?Genitourinary: Negative.  Negative for difficulty urinating, dysuria, frequency and hematuria.   ?Musculoskeletal:  Negative for  arthralgias, back pain, flank pain, gait problem and myalgias.  ?Skin: Negative.   ?     Extensive skin cancers of the face  ?Neurological:  Positive for extremity weakness (in a wheelchair today). Negative for dizziness, gait problem, headaches, light-headedness, numbness, seizures and speech difficulty.  ?Hematological: Negative.   ?Psychiatric/Behavioral: Negative.  Negative for depression and sleep disturbance. The patient is not nervous/anxious.   ?All other systems reviewed and are negative.  ? ?VITALS:  ?Blood pressure (!) 97/55, pulse 67, temperature 98.2 ?F (36.8 ?C), temperature source Oral, resp. rate 17, height 5' 10.5" (1.791 m), weight 147 lb (66.7 kg), SpO2 96 %.  ?Wt Readings from Last 3 Encounters:  ?08/23/21 148 lb 8 oz (67.4 kg)  ?08/19/21 147 lb (66.7 kg)  ?08/05/21 144 lb 12.8 oz (65.7 kg)  ?  ?Body mass index is 20.79 kg/m?. ? ?Performance status (ECOG): 2 - Symptomatic, <50% confined to bed ? ?PHYSICAL EXAM:  ?Physical Exam ?Vitals and nursing note reviewed.  ?Constitutional:   ?   General: He is not in acute distress. ?   Appearance: Normal appearance. He is normal weight.  ?  HENT:  ?   Head: Normocephalic and atraumatic.  ?   Mouth/Throat:  ?   Mouth: Mucous membranes are moist.  ?   Pharynx: Oropharynx is clear. No oropharyngeal exudate or posterior oropharyngeal erythema.  ?Eyes:  ?   General: No scleral icterus. ?   Extraocular Movements: Extraocular movements intact.  ?   Conjunctiva/sclera: Conjunctivae normal.  ?   Pupils: Pupils are equal, round, and reactive to light.  ?Cardiovascular:  ?   Rate and Rhythm: Normal rate and regular rhythm.  ?   Heart sounds: Heart sounds not distant. No murmur heard. ?  No friction rub. No gallop.  ?Pulmonary:  ?   Effort: Pulmonary effort is normal.  ?   Breath sounds: Normal breath sounds. No wheezing, rhonchi or rales.  ?Abdominal:  ?   General: Bowel sounds are normal. There is no distension.  ?   Palpations: Abdomen is soft. There is no  hepatomegaly, splenomegaly or mass.  ?   Tenderness: There is no abdominal tenderness.  ?Musculoskeletal:     ?   General: Normal range of motion.  ?   Cervical back: Normal range of motion and neck supple. No tenderness.

## 2021-08-19 ENCOUNTER — Other Ambulatory Visit: Payer: Self-pay

## 2021-08-19 ENCOUNTER — Other Ambulatory Visit: Payer: Self-pay | Admitting: Oncology

## 2021-08-19 ENCOUNTER — Inpatient Hospital Stay: Payer: Medicare Other | Admitting: Oncology

## 2021-08-19 ENCOUNTER — Encounter: Payer: Self-pay | Admitting: Oncology

## 2021-08-19 ENCOUNTER — Inpatient Hospital Stay: Payer: Medicare Other

## 2021-08-19 VITALS — BP 97/55 | HR 67 | Temp 98.2°F | Resp 17 | Ht 70.5 in | Wt 147.0 lb

## 2021-08-19 DIAGNOSIS — D509 Iron deficiency anemia, unspecified: Secondary | ICD-10-CM | POA: Diagnosis not present

## 2021-08-19 DIAGNOSIS — C4491 Basal cell carcinoma of skin, unspecified: Secondary | ICD-10-CM | POA: Diagnosis not present

## 2021-08-19 DIAGNOSIS — C9110 Chronic lymphocytic leukemia of B-cell type not having achieved remission: Secondary | ICD-10-CM

## 2021-08-19 DIAGNOSIS — C4440 Unspecified malignant neoplasm of skin of scalp and neck: Secondary | ICD-10-CM

## 2021-08-19 NOTE — Progress Notes (Signed)
Patient on plan of care prior to pathways. 

## 2021-08-19 NOTE — Progress Notes (Signed)
START ON PATHWAY REGIMEN - Melanoma and Other Skin Cancers     A cycle is every 21 days:     [id number]   **Always confirm dose/schedule in your pharmacy ordering system**  Patient Characteristics: Cutaneous Squamous Cell Carcinoma, Locally Advanced - Unresectable, Systemic Therapy Indicated Disease Classification: Cutaneous Squamous Cell Carcinoma Therapeutic Status: Locally Advanced - Unresectable Click here if multiple primary tumors are present.: false Intent of Therapy: Non-Curative / Palliative Intent, Discussed with Patient 

## 2021-08-20 ENCOUNTER — Other Ambulatory Visit: Payer: Self-pay | Admitting: Pharmacist

## 2021-08-20 DIAGNOSIS — C4491 Basal cell carcinoma of skin, unspecified: Secondary | ICD-10-CM

## 2021-08-23 ENCOUNTER — Encounter: Payer: Self-pay | Admitting: Hematology and Oncology

## 2021-08-23 ENCOUNTER — Inpatient Hospital Stay: Payer: Medicare Other

## 2021-08-23 ENCOUNTER — Inpatient Hospital Stay: Payer: Medicare Other | Admitting: Hematology and Oncology

## 2021-08-23 ENCOUNTER — Other Ambulatory Visit: Payer: Self-pay

## 2021-08-23 ENCOUNTER — Other Ambulatory Visit: Payer: Self-pay | Admitting: Hematology and Oncology

## 2021-08-23 ENCOUNTER — Other Ambulatory Visit: Payer: Medicare Other

## 2021-08-23 VITALS — BP 102/57 | HR 65 | Temp 98.0°F | Resp 16 | Ht 70.5 in | Wt 148.5 lb

## 2021-08-23 DIAGNOSIS — C9110 Chronic lymphocytic leukemia of B-cell type not having achieved remission: Secondary | ICD-10-CM | POA: Diagnosis present

## 2021-08-23 DIAGNOSIS — C4491 Basal cell carcinoma of skin, unspecified: Secondary | ICD-10-CM

## 2021-08-23 DIAGNOSIS — Z79899 Other long term (current) drug therapy: Secondary | ICD-10-CM | POA: Diagnosis not present

## 2021-08-23 LAB — HEPATIC FUNCTION PANEL
ALT: 26 U/L (ref 10–40)
AST: 28 (ref 14–40)
Alkaline Phosphatase: 70 (ref 25–125)
Bilirubin, Total: 0.4

## 2021-08-23 LAB — TSH: TSH: 1.672 u[IU]/mL (ref 0.350–4.500)

## 2021-08-23 LAB — BASIC METABOLIC PANEL
BUN: 27 — AB (ref 4–21)
CO2: 27 — AB (ref 13–22)
Chloride: 106 (ref 99–108)
Creatinine: 0.9 (ref 0.6–1.3)
Glucose: 122
Potassium: 3.8 mEq/L (ref 3.5–5.1)
Sodium: 142 (ref 137–147)

## 2021-08-23 LAB — COMPREHENSIVE METABOLIC PANEL
Albumin: 3.2 — AB (ref 3.5–5.0)
Calcium: 8.1 — AB (ref 8.7–10.7)

## 2021-08-23 LAB — PROTEIN, TOTAL: Total Protein: 5.4 g/dL — AB (ref 6.3–8.2)

## 2021-08-23 LAB — CBC AND DIFFERENTIAL
HCT: 31 — AB (ref 41–53)
Hemoglobin: 9.4 — AB (ref 13.5–17.5)
Neutrophils Absolute: 8.58
Platelets: 213 10*3/uL (ref 150–400)
WBC: 143

## 2021-08-23 LAB — CBC
Absolute Lymphocytes: 130.13 — AB (ref 0.63–4.75)
MCV: 90 (ref 80–94)
RBC: 3.44 — AB (ref 3.87–5.11)

## 2021-08-23 LAB — CORRECTED CALCIUM (CC13): Calcium, Corrected: 8.9

## 2021-08-23 NOTE — Progress Notes (Signed)
? ?CHEMO CARE CLINIC CONSULT NOTE ?Patient Care Team: ?Raelene Bott, MD as PCP - General (Internal Medicine)  ? ?Name of the patient: Fernando Young  ?[id number]  ?[birth year]  ? ?Date of visit: 08/23/21 ? ?Diagnosis- Skin Cancer ? ?Chief complaint/Reason for visit- Initial Meeting for [facility], preparing for starting chemotherapy ? ?Heme/Onc history:  ?Oncology History  ?CLL (chronic lymphocytic leukemia) ([place])  ?05/25/2015 Initial Diagnosis  ? CLL (chronic lymphocytic leukemia) ([place]) ? ?  ?02/04/2021 Cancer Staging  ? Staging form: Chronic Lymphocytic Leukemia / Small Lymphocytic Lymphoma, AJCC 8th Edition ?- Clinical stage from 02/04/2021: Modified Rai Stage III (Modified Rai risk: High, Binet: Stage A, Lymphocytosis: Present, Adenopathy: Absent, Organomegaly: Absent, Anemia: Present, Thrombocytopenia: Absent) - Signed by Derwood Kaplan, MD on 02/08/2021 ?Stage prefix: Recurrence ?Absolute lymphocyte count (ALC) (cells/uL): 100 ?Hemoglobin (Hgb) (g/dL): 11.4 ?Platelet count (x10E9/L of blood): 184 ? ?  ?Cancer of the skin, basal cell  ?05/06/2021 Initial Diagnosis  ? Cancer of the skin, basal cell ? ?  ?08/27/2021 -  Chemotherapy  ? Patient is on Treatment Plan : MELANOMA Cemiplimab q21d  ? ?  ?  ? ? ?Interval history-  Patient presents to chemo care clinic today for initial meeting in preparation for starting chemotherapy. I introduced the chemo care clinic and we discussed that the role of the clinic is to assist those who are at an increased risk of emergency room visits and/or complications during the course of chemotherapy treatment. We discussed that the increased risk takes into account factors such as age, performance status, and co-morbidities. We also discussed that for some, this might include barriers to care such as not having a primary care provider, lack of insurance/transportation, or not being able to afford medications. We discussed that the goal of the program is to help prevent  unplanned ER visits and help reduce complications during chemotherapy. We do this by discussing specific risk factors to each individual and identifying ways that we can help improve these risk factors and reduce barriers to care.  ? ?No Known Allergies ? ?Past Medical History:  ?Diagnosis Date  ? B12 deficiency   ? Cancer of the skin, 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  ? CHF (congestive heart failure) ([place])   ? Diabetes mellitus without complication ([place])   ? History of prostate cancer   ? Hyperlipidemia   ? Hypertension   ? Hypoglycemia 05/06/2021  ? Iron deficiency anemia 02/20/2020  ? Leucocytosis   ? Leukemia, lymphocytic, chronic ([place])   ? ? ?Past Surgical History:  ?Procedure Laterality Date  ? PROSTATE BIOPSY    ? ? ?Social History  ? ?Socioeconomic History  ? Marital status: Divorced  ?  Spouse name: Not on file  ? Number of children: 2  ? Years of education: Not on file  ? Highest education level: Not on file  ?Occupational History  ? Not on file  ?Tobacco Use  ? Smoking status: Former  ? Smokeless tobacco: Never  ?Substance and Sexual Activity  ? Alcohol use: Not Currently  ? Drug use: Never  ? Sexual activity: Not Currently  ?Other Topics Concern  ? Not on file  ?Social History Narrative  ? Not on file  ? ?Social Determinants of Health  ? ?Financial Resource Strain: Not on file  ?Food Insecurity: Not on file  ?Transportation Needs: Not on file  ?Physical Activity: Not on file  ?Stress: Not on file  ?Social Connections: Not on file  ?Intimate Partner Violence: Not on file  ? ? ?  Family History  ?Problem Relation Age of Onset  ? Ovarian cancer Mother 3  ? Cancer Sister 30  ? ? ? ?Current Outpatient Medications:  ?  amiodarone (PACERONE) 200 MG tablet, Take 200 mg by mouth daily., Disp: , Rfl:  ?  Ascorbic Acid (VITAMIN C) 500 MG CAPS, Take 500 mg by mouth daily., Disp: , Rfl:  ?  benazepril (LOTENSIN) 20 MG tablet, Take 20 mg by mouth daily., Disp: , Rfl:  ?  cholecalciferol (VITAMIN D3) 25 MCG (1000 UNIT) tablet, Take  1,000 Units by mouth daily., Disp: , Rfl:  ?  Dextromethorphan-guaiFENesin (CORICIDIN HBP CONGESTION/COUGH PO), Take 1 tablet by mouth every 12 (twelve) hours as needed (cold symptoms)., Disp: , Rfl:  ?  doxycycline (VIBRA-TABS) 100 MG tablet, Take 100 mg by mouth 2 (two) times daily., Disp: , Rfl:  ?  ELIQUIS 5 MG TABS tablet, Take 5 mg by mouth 2 (two) times daily., Disp: , Rfl:  ?  furosemide (LASIX) 20 MG tablet, Take 20 mg by mouth 2 (two) times daily., Disp: , Rfl:  ?  ibuprofen (ADVIL) 200 MG tablet, Take 200 mg by mouth daily as needed for mild pain., Disp: , Rfl:  ?  ketoconazole (NIZORAL) 2 % shampoo, Apply topically 3 (three) times a week., Disp: , Rfl:  ?  levothyroxine (SYNTHROID) 75 MCG tablet, Take 75 mcg by mouth daily., Disp: , Rfl:  ?  metFORMIN (GLUCOPHAGE-XR) 500 MG 24 hr tablet, Take 500 mg by mouth 2 (two) times daily., Disp: , Rfl:  ?  Omega-3 Fatty Acids (FISH OIL) 1000 MG CAPS, Take 1,000 mg by mouth daily., Disp: , Rfl:  ?  rOPINIRole (REQUIP) 0.5 MG tablet, Take 0.5-1 mg by mouth See admin instructions. Taking one tablet at bedtime, if no relief take an additional tablet, Disp: , Rfl:  ?  rosuvastatin (CRESTOR) 20 MG tablet, Take 10 mg by mouth daily., Disp: , Rfl:  ?  vismodegib (ERIVEDGE) 150 MG capsule, Take 150 mg by mouth daily., Disp: , Rfl:  ?  vitamin B-12 (CYANOCOBALAMIN) 500 MCG tablet, Take 500 mcg by mouth daily., Disp: , Rfl:  ? ? ?  Latest Ref Rng & Units 08/23/2021  ? 12:00 AM  ?CMP  ?BUN 4 - 21 27       ?Creatinine 0.6 - 1.3 0.9       ?Sodium 137 - 147 142       ?Potassium 3.5 - 5.1 mEq/L 3.8       ?Chloride 99 - 108 106       ?CO2 13 - 22 27       ?Calcium 8.7 - 10.7 8.1       ?Total Protein 6.3 - 8.2 g/dL 5.4       ?Alkaline Phos 25 - 125 70       ?AST 14 - 40 28       ?ALT 10 - 40 U/L 26       ?  ? This result is from an external source.  ? ? ?  Latest Ref Rng & Units 08/23/2021  ? 12:00 AM  ?CBC  ?WBC  143.0       ?Hemoglobin 13.5 - 17.5 9.4       ?Hematocrit 41 - 53 31        ?Platelets 150 - 400 K/uL 213       ?  ? This result is from an external source.  ? ? ?No images are attached to  the encounter. ? ?No results found. ? ? ?Assessment and plan- Patient is a 81 y.o. male who presents to [facility] for initial meeting in preparation for starting chemotherapy for the treatment of skin cancer.  ? ?Chemo Care Clinic/High Risk for ER/Hospitalization during chemotherapy- We discussed the role of the chemo care clinic and identified patient specific risk factors. I discussed that patient was identified as high risk primarily based on: ? ?Patient has past medical history positive for: ?Past Medical History:  ?Diagnosis Date  ? B12 deficiency   ? Cancer of the skin, 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  ? CHF (congestive heart failure) ([place])   ? Diabetes mellitus without complication ([place])   ? History of prostate cancer   ? Hyperlipidemia   ? Hypertension   ? Hypoglycemia 05/06/2021  ? Iron deficiency anemia 02/20/2020  ? Leucocytosis   ? Leukemia, lymphocytic, chronic ([place])   ? ? ?Patient has past surgical history positive for: ?Past Surgical History:  ?Procedure Laterality Date  ? PROSTATE BIOPSY    ? ? ? ?We discussed that social determinants of health may have significant impacts on health and outcomes for cancer patients.  Today we discussed specific social determinants of performance status, alcohol use, depression, financial needs, food insecurity, housing, interpersonal violence, social connections, stress, tobacco use, and transportation.   ? ?After lengthy discussion the following were identified as areas of need:  ? ?Outpatient services: We discussed options including home based and outpatient services, DME and care program. We discusssed that patients who participate in regular physical activity report fewer negative impacts of cancer and treatments and report less fatigue.  ? ?Financial Concerns: We discussed that living with cancer can create tremendous financial burden.  We discussed  options for assistance. I asked that if assistance is needed in affording medications or paying bills to please let us know so that we can provide assistance. We discussed options for food including social servic

## 2021-08-24 ENCOUNTER — Encounter: Payer: Self-pay | Admitting: Oncology

## 2021-08-24 LAB — T4: T4, Total: 7.9 ug/dL (ref 4.5–12.0)

## 2021-08-27 ENCOUNTER — Other Ambulatory Visit: Payer: Self-pay | Admitting: Oncology

## 2021-08-27 ENCOUNTER — Inpatient Hospital Stay: Payer: Medicare Other

## 2021-08-27 ENCOUNTER — Ambulatory Visit: Payer: Medicare Other

## 2021-08-29 ENCOUNTER — Other Ambulatory Visit: Payer: Self-pay | Admitting: Oncology

## 2021-08-29 ENCOUNTER — Encounter: Payer: Self-pay | Admitting: Oncology

## 2021-08-29 DIAGNOSIS — D509 Iron deficiency anemia, unspecified: Secondary | ICD-10-CM

## 2021-08-30 ENCOUNTER — Inpatient Hospital Stay: Payer: Medicare Other | Attending: Oncology

## 2021-08-30 VITALS — BP 106/57 | HR 68 | Temp 98.2°F | Resp 18 | Ht 70.5 in | Wt 147.0 lb

## 2021-08-30 DIAGNOSIS — Z79899 Other long term (current) drug therapy: Secondary | ICD-10-CM | POA: Diagnosis not present

## 2021-08-30 DIAGNOSIS — C9110 Chronic lymphocytic leukemia of B-cell type not having achieved remission: Secondary | ICD-10-CM | POA: Insufficient documentation

## 2021-08-30 DIAGNOSIS — Z5112 Encounter for antineoplastic immunotherapy: Secondary | ICD-10-CM | POA: Diagnosis present

## 2021-08-30 DIAGNOSIS — C4491 Basal cell carcinoma of skin, unspecified: Secondary | ICD-10-CM | POA: Diagnosis present

## 2021-08-30 MED ORDER — SODIUM CHLORIDE 0.9 % IV SOLN
350.0000 mg | Freq: Once | INTRAVENOUS | Status: AC
  Administered 2021-08-30: 350 mg via INTRAVENOUS
  Filled 2021-08-30: qty 7

## 2021-08-30 MED ORDER — SODIUM CHLORIDE 0.9 % IV SOLN: Freq: Once | INTRAVENOUS | Status: AC

## 2021-08-30 MED ORDER — HEPARIN SOD (PORK) LOCK FLUSH 100 UNIT/ML IV SOLN
500.0000 [IU] | Freq: Once | INTRAVENOUS | Status: AC | PRN
  Administered 2021-08-30: 500 [IU]

## 2021-08-30 MED ORDER — SODIUM CHLORIDE 0.9% FLUSH
10.0000 mL | INTRAVENOUS | Status: DC | PRN
  Administered 2021-08-30: 10 mL

## 2021-08-30 NOTE — Patient Instructions (Signed)
Cemiplimab Injection What is this medication? CEMIPLIMAB (se MIP li mab) treats skin cancer and lung cancer. It works by blocking a protein that causes cancer cells to grow and multiply. This helps to slow or stop the spread of cancer cells. It is a monoclonal antibody. This medicine may be used for other purposes; ask your health care provider or pharmacist if you have questions. COMMON BRAND NAME(S): LIBTAYO What should I tell my care team before I take this medication? They need to know if you have any of these conditions: Allogeneic stem cell transplant (uses someone else's stem cells) Autoimmune diseases, such as Crohn's disease, ulcerative colitis, or lupus Diabetes Nervous system problems, such as myasthenia gravis or Guillain-Barre syndrome Organ transplant Recent or ongoing radiation Thyroid disease An unusual or allergic reaction to cemiplimab, other medications, foods, dyes, or preservatives Pregnant or trying to get pregnant Breast-feeding How should I use this medication? This medication is injected into a vein. It is given by your care team in a hospital or clinic setting. A special MedGuide will be given to you before each treatment. Be sure to read this information carefully each time. Talk to your care team about the use of this medication in children. Special care may be needed. Overdosage: If you think you have taken too much of this medicine contact a poison control center or emergency room at once. NOTE: This medicine is only for you. Do not share this medicine with others. What if I miss a dose? Keep appointments for follow-up doses. It is important not to miss your dose. Call your care team if you are unable to keep an appointment. What may interact with this medication? Interactions have not been studied. This list may not describe all possible interactions. Give your health care provider a list of all the medicines, herbs, non-prescription drugs, or dietary  supplements you use. Also tell them if you smoke, drink alcohol, or use illegal drugs. Some items may interact with your medicine. What should I watch for while using this medication? This medication may make you feel generally unwell. This is not uncommon as chemotherapy can affect healthy cells as well as cancer cells. Report any side effects. Continue your course of treatment even though you feel ill until your care team tells you to stop. You may need blood work done while you are taking this medication. This medication may cause serious skin reactions. They can happen in the weeks to months after starting the medication. Contact your care team right away if you notice fevers or flu-like symptoms with a rash. The rash may be red or purple and then turn into blisters or peeling of the skin. You may also notice a red rash with swelling of the face, lips, or lymph nodes in your neck or under your arms. Tell your care team right away if you have any changes in your vision. This medication may increase blood sugar. The risk may be higher in patients who already have diabetes. Ask your care team what you can do to lower your risk of diabetes while taking this medication. Talk to your care team if you wish to become pregnant or think you might be pregnant. This medication can cause serious birth defects if taken during pregnancy. A negative pregnancy test is required before starting this medication. A reliable form of contraception is recommended while taking this medication and for at least 4 months after stopping it. Do not breast-feed while taking this medication and for at least 4   months after stopping it. What side effects may I notice from receiving this medication? Side effects that you should report to your care team as soon as possible: Allergic reactions--skin rash, itching, hives, swelling of the face, lips, tongue, or throat Change in vision Dry cough, shortness of breath or trouble  breathing Fever, neck pain or stiffness, sensitivity to light, headache, nausea, vomiting, confusion Headache, double vision, blurry vision, sensitivity to light, eye pain Heart muscle inflammation--unusual weakness or fatigue, shortness of breath, chest pain, fast or irregular heartbeat, dizziness, swelling of the ankles, feet, or hands High blood sugar (hyperglycemia)--increased thirst or amount of urine, unusual weakness or fatigue, blurry vision High thyroid levels (hyperthyroidism)--fast or irregular heartbeat, weight loss, excessive sweating or sensitivity to heat, tremors or shaking, anxiety, nervousness, irregular menstrual cycle or spotting Infusion reactions--chest pain, shortness of breath or trouble breathing, feeling faint or lightheaded Kidney injury (glomerulonephritis)--decrease in the amount of urine, red or dark brown urine, foamy or bubbly urine, swelling of the ankles, hands, or feet Liver injury--right upper belly pain, loss of appetite, nausea, light-colored stool, dark yellow or brown urine, yellowing skin or eyes, unusual weakness or fatigue Low adrenal gland function--nausea, vomiting, loss of appetite, unusual weakness or fatigue, dizziness Low red blood cell level--unusual weakness or fatigue, dizziness, headache, trouble breathing Low thyroid levels (hypothyroidism)--unusual weakness or fatigue, increased sensitivity to cold, constipation, hair loss, dry skin, weight gain, feelings of depression Pain, tingling, or numbness in the hands or feet, muscle weakness, trouble walking, loss of balance or coordination Rash, fever, and swollen lymph nodes Redness, blistering, peeling, or loosening of the skin, including inside the mouth Sudden or severe stomach pain, bloody diarrhea, fever, nausea, vomiting Unusual bruising or bleeding Side effects that usually do not require medical attention (report these to your care team if they continue or are bothersome): Bone  pain Diarrhea Muscle pain This list may not describe all possible side effects. Call your doctor for medical advice about side effects. You may report side effects to FDA at 1-800-FDA-1088. Where should I keep my medication? This medication is given in a hospital or clinic and will not be stored at home. NOTE: This sheet is a summary. It may not cover all possible information. If you have questions about this medicine, talk to your doctor, pharmacist, or health care provider.  2023 Elsevier/Gold Standard (2021-04-09 00:00:00) 

## 2021-08-31 ENCOUNTER — Inpatient Hospital Stay: Payer: Medicare Other

## 2021-09-02 ENCOUNTER — Telehealth: Payer: Self-pay

## 2021-09-02 NOTE — Telephone Encounter (Signed)
I spoke with Mr Duplantis. He states, "I'm doing good". Pt denies N/V, rashes/itching, fever. He has had a "little bit of diarrhea". Pt dopes have Imodium in the home, if he needs it. I instructed him to drink a cup of fluid for every loose stool he has. I reminded pt to call us if he develops temp of 100.4 or higher, day or night. He verbalized understanding.  We confirmed next appt for 09/15/2021. ?

## 2021-09-15 ENCOUNTER — Inpatient Hospital Stay: Payer: Medicare Other | Admitting: Oncology

## 2021-09-15 ENCOUNTER — Encounter: Payer: Self-pay | Admitting: Oncology

## 2021-09-15 ENCOUNTER — Inpatient Hospital Stay: Payer: Medicare Other

## 2021-09-15 VITALS — BP 94/50 | HR 60 | Temp 97.8°F | Resp 18 | Ht 70.5 in | Wt 147.7 lb

## 2021-09-15 DIAGNOSIS — D509 Iron deficiency anemia, unspecified: Secondary | ICD-10-CM

## 2021-09-15 DIAGNOSIS — C4491 Basal cell carcinoma of skin, unspecified: Secondary | ICD-10-CM

## 2021-09-15 DIAGNOSIS — Z5112 Encounter for antineoplastic immunotherapy: Secondary | ICD-10-CM | POA: Diagnosis not present

## 2021-09-15 DIAGNOSIS — C9110 Chronic lymphocytic leukemia of B-cell type not having achieved remission: Secondary | ICD-10-CM

## 2021-09-15 LAB — FERRITIN: Ferritin: 48 ng/mL (ref 24–336)

## 2021-09-15 LAB — IRON AND TIBC
Iron: 37 ug/dL — ABNORMAL LOW (ref 45–182)
Saturation Ratios: 13 % — ABNORMAL LOW (ref 17.9–39.5)
TIBC: 291 ug/dL (ref 250–450)
UIBC: 254 ug/dL

## 2021-09-15 LAB — TSH: TSH: 2.805 u[IU]/mL (ref 0.350–4.500)

## 2021-09-15 LAB — BASIC METABOLIC PANEL
BUN: 29 — AB (ref 4–21)
CO2: 29 — AB (ref 13–22)
Chloride: 104 (ref 99–108)
Creatinine: 1 (ref 0.6–1.3)
Glucose: 113
Potassium: 4 mEq/L (ref 3.5–5.1)
Potassium: 7.4 mEq/L — AB (ref 3.5–5.1)
Sodium: 137 (ref 137–147)

## 2021-09-15 LAB — CBC: RBC: 3.27 — AB (ref 3.87–5.11)

## 2021-09-15 LAB — HEPATIC FUNCTION PANEL
ALT: 26 U/L (ref 10–40)
AST: 77 — AB (ref 14–40)
Alkaline Phosphatase: 60 (ref 25–125)
Bilirubin, Total: 0.5

## 2021-09-15 LAB — COMPREHENSIVE METABOLIC PANEL
Albumin: 3.1 — AB (ref 3.5–5.0)
Calcium: 7.6 — AB (ref 8.7–10.7)

## 2021-09-15 LAB — CBC AND DIFFERENTIAL
HCT: 30 — AB (ref 41–53)
Hemoglobin: 9.1 — AB (ref 13.5–17.5)
Neutrophils Absolute: 8.56
Platelets: 237 10*3/uL (ref 150–400)
WBC: 142.7

## 2021-09-15 NOTE — Progress Notes (Signed)
[facility]  [address] [place],  [place]  [address] [phone]  Clinic Day: 09/15/21  Referring physician: Raelene Bott, MD  ASSESSMENT & PLAN:   Assessment & Plan: 1.  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 However, he had not had problems with splenomegaly, adenopathy, or thrombocytopenia, but his anemia has only partially improved with iron replacement, so may be more from his CLL.  As he is fairly stable, it is not urgent that he start treatment at this time. We plan acalabrutinib when he needs to start treatment, and his worsening lymphocytosis and anemia may make this necessary.  We are making sure he is stable on the immunotherapy first.   2.  Anemia, persistent and worsening. He last received IV Feraheme in July, and has recurrent iron deficiency.    3.  Hyperkalemia, which is chronic, but normal today with the medication from Dr. Heber Young when we recheck it in a red top tube.  (It was 7.4 in the green top tube)   4.  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  This is a reported adverse effect and so we changed him to [place].  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  He tolerated this without difficulty and will receive his second dose later this week.  His white blood count is stable today, so we will consider treatment with a acalabrutinib and I have discussed this briefly with them.  I would like to make sure that he tolerates the second dose of Libtayo and then we will proceed with ordering a acalabrutinib.  There are no negative interactions with his current medications.  Otherwise, we will see him back in 3 weeks with CBC and CMP to  see how he is tolerating the new medication.  We repeated iron studies and these are consistent with iron deficiency, so we will schedule him for IV iron again, since his hemoglobin is dropping again. The patient and his daughter understand the plans discussed today and are in agreement with them. They know to contact our office if he develops concerns prior to his next appointment.    [facility] [address] [place] [address] Dept: [phone] Dept Fax: [phone]      CHIEF COMPLAINT:  CC: Chronic lymphocytic leukemia, unresectable skin cancers of the scalp and face  Current Treatment: Libtayo   HISTORY OF PRESENT ILLNESS:  Fernando Young is a 81 y.o. 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  The CLL was confirmed by flow cytometry in July 2017.  He has had slow progression of his lymphocytosis over time, with mild anemia, but has remained on observation.  He has multiple medical comorbidities including diabetes, congestive heart failure, B12 deficiency, cardiac arrhythmias, and history of prostate cancer. The white blood cell count has steadily increased over the years.  He was advised to start therapy years ago but we have opted for observation and he has been largely asymptomatic until now.  In October 2021, he was found to have iron deficiency despite oral iron supplementation. B12 and folate levels were normal.  He received IV iron in form of Feraheme. Oral iron was discontinued, as we did not feel he was absorbing it. Oral B12 was continued.  His hemoglobin improved but did not normalize with the IV iron.  He received IV iron again in July 2022 when his hemoglobin was down to 9.8. The white blood cell count has been running between 100,000 and 116,000 for the past year.  He has been relatively asymptomatic. He was diagnosed with atrial  fibrillation.  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 INTERVAL HISTORY:  Fernando Young is here for routine follow up and states that he is doing fair.  He complains of generalized weakness and has had a couple of falls. Otherwise, he denies complaints.  The treatment for his multiple skin cancers has been changed to Libtayo, especially since he has developed a large new aggressive squamous cell carcinoma while on the oral therapy.  He tolerated the first dose without difficulty and will get the second 1 this week.  The skin cancers of his face and scalp are beyond surgical resection at this time due to the widespread extension.  His white count is stable at 142.7 with an ALC of 131,000, and his hemoglobin has decreased from 9.4 to 9.1, and platelets are normal.  The potassium was read as 7.4 but was normal once once this was rerun and a red top tube.  He does have a mild elevation of his SGOT to 77 and this is a new finding.  His BUN is elevated at 23 but with a normal creatinine of 1.0.  His  appetite is fair.  He denies fever, chills or other signs of infection.  He denies nausea, vomiting, bowel issues, or abdominal pain.  He denies sore throat, cough, dyspnea, or chest pain. He is accompanied by his daughter today as usual.  REVIEW OF SYSTEMS:  Review of Systems  Constitutional:  Negative for appetite change, chills, fatigue, fever and unexpected weight change.  HENT:  Negative.    Eyes: Negative.   Respiratory: Negative.  Negative for chest tightness, cough, hemoptysis, shortness of breath and wheezing.   Cardiovascular: Negative.  Negative for chest pain, leg swelling and palpitations.  Gastrointestinal: Negative.  Negative for abdominal distention, abdominal pain, blood in stool, constipation, diarrhea, nausea and vomiting.  Endocrine: Negative.    Genitourinary: Negative.  Negative for difficulty urinating, dysuria, frequency and hematuria.   Musculoskeletal:  Negative for arthralgias, back pain, flank pain, gait problem and myalgias.  Skin: Negative.        Extensive skin cancers of the face  Neurological:  Positive for extremity weakness (in a wheelchair today). Negative for dizziness, gait problem, headaches, light-headedness, numbness, seizures and speech difficulty.  Hematological: Negative.   Psychiatric/Behavioral: Negative.  Negative for depression and sleep disturbance. The patient is not nervous/anxious.   All other systems reviewed and are negative.   VITALS:  Blood pressure (!) 94/50, pulse 60, temperature 97.8 F (36.6 C), temperature source Oral, resp. rate 18, height 5' 10.5" (1.791 m), weight 147 lb 11.2 oz (67 kg), SpO2 98 %.  Wt Readings from Last 3 Encounters:  10/01/21 145 lb (65.8 kg)  09/17/21 147 lb 14.4 oz (67.1 kg)  09/15/21 147 lb 11.2 oz (67 kg)    Body mass index is 20.89 kg/m.  Performance status (ECOG): 2 - Symptomatic, <50% confined to bed  PHYSICAL EXAM:  Physical Exam Vitals and nursing note reviewed.  Constitutional:      General: He is not in acute distress.    Appearance: Normal appearance. He is normal weight.  HENT:     Head: Normocephalic and atraumatic.     Mouth/Throat:     Mouth: Mucous membranes are moist.     Pharynx: Oropharynx is clear. No oropharyngeal exudate or posterior oropharyngeal erythema.  Eyes:     General: No scleral icterus.    Extraocular Movements: Extraocular movements intact.     Conjunctiva/sclera: Conjunctivae normal.     Pupils: Pupils are equal, round, and reactive to light.  Cardiovascular:     Rate and Rhythm: Normal rate and regular rhythm.     Heart sounds: Heart sounds not distant. No murmur heard.   No friction rub. No gallop.  Pulmonary:     Effort: Pulmonary effort is normal.     Breath sounds: Normal breath sounds. No wheezing, rhonchi or  rales.  Abdominal:     General: Bowel sounds are normal. There is no distension.     Palpations: Abdomen is soft. There is no hepatomegaly, splenomegaly or mass.     Tenderness: There is no abdominal tenderness.  Musculoskeletal:        General: Normal range of motion.     Cervical back: Normal range of motion and neck supple. No tenderness.     Right lower leg: Edema present.     Left lower leg: Edema present.  Lymphadenopathy:     Cervical: No cervical adenopathy.     Upper Body:     Right upper body: No supraclavicular or axillary adenopathy.     Left upper body: No supraclavicular or axillary adenopathy.     Lower Body: No right inguinal adenopathy. No left inguinal adenopathy.  Skin:    General: Skin is warm and dry.     Coloration: Skin is not jaundiced.     Findings: Lesion (multiple nodular and erythematous skin lesions of the face, left greater than right. Some are keratotic.) present. No rash.     Comments: He has many widespread malignancies of his scalp and face which are red and peeling.  Neurological:     Mental Status: He is alert and oriented to person, place, and time.     Cranial Nerves: No cranial nerve deficit.  Psychiatric:        Mood and Affect: Mood normal.        Behavior: Behavior normal.        Thought Content: Thought content normal.    LABS:      Latest Ref Rng & Units 09/15/2021   12:00 AM 08/23/2021   12:00 AM 08/05/2021   12:00 AM  CBC  WBC  142.7      143.0      161.2       Hemoglobin 13.5 - 17.5 9.1      9.4      9.5       Hematocrit 41 - 53 30      31      32       Platelets 150 - 400 K/uL 237      213      206          This result is from an external source.      Latest Ref Rng & Units 09/15/2021   12:00 AM 08/23/2021   12:00 AM 08/05/2021   12:00 AM  CMP  BUN 4 - '21 29      27      28       '$ Creatinine 0.6 - 1.3 1.0      0.9      1.2       Sodium 137 - 147 137      142      141       Potassium 3.5 - 5.1 mEq/L 7.4        4.0      3.8       3.8       Chloride 99 - 108 104      106      107       CO2 13 - '22 29      27      28       '$ Calcium 8.7 - 10.7 7.6      8.1      7.9       Total Protein 6.3 - 8.2 g/dL  5.4        Alkaline Phos 25 - 125 60      70      62       AST 14 - 40 77      28      27       ALT 10 - 40 U/L '26      26      27          '$ This result is from an external source.   Multiple values from one day are sorted in reverse-chronological order    Lab Results  Component Value Date   TIBC 291 09/15/2021   TIBC 341 06/04/2021   TIBC 304 11/04/2020   FERRITIN 48 09/15/2021   FERRITIN 53 06/04/2021   FERRITIN 40.6 11/04/2020   IRONPCTSAT 13 (L) 09/15/2021   IRONPCTSAT 11 (L) 06/04/2021   IRONPCTSAT 11 (A) 11/04/2020   Lab Results  Component Value Date   LDH 166 08/03/2020    STUDIES:  No results found.    HISTORY:   Allergies: No Known Allergies  Current Medications: Current Outpatient Medications  Medication Sig Dispense Refill   amiodarone (PACERONE) 200 MG tablet Take 200 mg by mouth daily.     Ascorbic Acid (VITAMIN C) 500 MG CAPS Take 500 mg by mouth daily.     benazepril (LOTENSIN) 20 MG tablet Take 20 mg by mouth daily.     cholecalciferol (VITAMIN D3) 25 MCG (1000 UNIT) tablet Take 1,000 Units by mouth daily.     Dextromethorphan-guaiFENesin (CORICIDIN HBP CONGESTION/COUGH PO) Take 1 tablet by mouth every 12 (twelve) hours as needed (cold symptoms).     doxycycline (VIBRA-TABS) 100 MG tablet Take 100 mg by mouth 2 (two) times daily.     ELIQUIS 5 MG TABS tablet Take 5 mg by mouth 2 (two) times daily.     furosemide (LASIX) 20 MG tablet Take 20 mg by mouth 2 (two) times daily.     ibuprofen (ADVIL) 200 MG tablet Take 200 mg by mouth daily as needed for mild pain.     ketoconazole (NIZORAL) 2 % shampoo Apply topically 3 (three) times a week.     levothyroxine (SYNTHROID) 75 MCG tablet Take 75 mcg by mouth daily.     metFORMIN (GLUCOPHAGE-XR) 500 MG 24 hr tablet Take 500 mg by mouth 2  (two) times daily.     Omega-3 Fatty Acids (FISH OIL)  1000 MG CAPS Take 1,000 mg by mouth daily.     rOPINIRole (REQUIP) 0.5 MG tablet Take 0.5-1 mg by mouth See admin instructions. Taking one tablet at bedtime, if no relief take an additional tablet     rosuvastatin (CRESTOR) 20 MG tablet Take 10 mg by mouth daily.     vismodegib (ERIVEDGE) 150 MG capsule Take 150 mg by mouth daily.     vitamin B-12 (CYANOCOBALAMIN) 500 MCG tablet Take 500 mcg by mouth daily.     No current facility-administered medications for this visit.

## 2021-09-16 MED FILL — Cemiplimab-rwlc IV Soln 350 MG/7ML (50 MG/ML): INTRAVENOUS | Qty: 7 | Status: AC

## 2021-09-17 ENCOUNTER — Inpatient Hospital Stay: Payer: Medicare Other

## 2021-09-17 VITALS — BP 101/57 | HR 57 | Temp 98.0°F | Resp 18 | Wt 147.9 lb

## 2021-09-17 DIAGNOSIS — C4491 Basal cell carcinoma of skin, unspecified: Secondary | ICD-10-CM

## 2021-09-17 DIAGNOSIS — Z5112 Encounter for antineoplastic immunotherapy: Secondary | ICD-10-CM | POA: Diagnosis not present

## 2021-09-17 LAB — T4: T4, Total: 8.1 ug/dL (ref 4.5–12.0)

## 2021-09-17 MED ORDER — SODIUM CHLORIDE 0.9 % IV SOLN
350.0000 mg | Freq: Once | INTRAVENOUS | Status: AC
  Administered 2021-09-17: 350 mg via INTRAVENOUS
  Filled 2021-09-17: qty 7

## 2021-09-17 MED ORDER — SODIUM CHLORIDE 0.9 % IV SOLN: Freq: Once | INTRAVENOUS | Status: AC

## 2021-09-20 ENCOUNTER — Encounter: Payer: Self-pay | Admitting: Oncology

## 2021-09-20 ENCOUNTER — Telehealth: Payer: Self-pay | Admitting: Oncology

## 2021-09-20 NOTE — Telephone Encounter (Signed)
Contacted pt to schedule appts, unable to reach via phone.  Scheduling Message Entered by Juanetta Beets on 09/20/2021 at  9:16 AM Priority: Routine <No visit type provided>  Department: [facility]  Provider:   Appointment Notes:  Please schedule pt for 2 doses of feraheme 4 to 8 days apart.  We can be as flexible as they need since they are coming far for treatment.  I was thinking 6/2 and 6/9, but whatever dates that works best for the patient and his daughter.  Scheduling Notes:

## 2021-09-22 ENCOUNTER — Telehealth: Payer: Self-pay | Admitting: Oncology

## 2021-09-22 NOTE — Telephone Encounter (Signed)
Patient has been scheduled. Aware of appt date and time;ed   Scheduling Message Entered by Juanetta Beets on 09/20/2021 at  9:16 AM Priority: Routine <No visit type provided>  Department: [facility]  Provider:   Appointment Notes:  Please schedule pt for 2 doses of feraheme 4 to 8 days apart.  We can be as flexible as they need since they are coming far for treatment.  I was thinking 6/2 and 6/9, but whatever dates that works best for the patient and his daughter.  Scheduling Notes:

## 2021-09-30 ENCOUNTER — Other Ambulatory Visit: Payer: Self-pay | Admitting: Pharmacist

## 2021-09-30 MED FILL — Ferumoxytol Inj 510 MG/17ML (30 MG/ML) (Elemental Fe): INTRAVENOUS | Qty: 17 | Status: AC

## 2021-10-01 ENCOUNTER — Inpatient Hospital Stay: Payer: Medicare Other | Attending: Oncology

## 2021-10-01 VITALS — BP 100/56 | HR 63 | Temp 97.8°F | Resp 14 | Wt 145.0 lb

## 2021-10-01 DIAGNOSIS — Z5112 Encounter for antineoplastic immunotherapy: Secondary | ICD-10-CM | POA: Insufficient documentation

## 2021-10-01 DIAGNOSIS — D509 Iron deficiency anemia, unspecified: Secondary | ICD-10-CM | POA: Diagnosis not present

## 2021-10-01 DIAGNOSIS — C4491 Basal cell carcinoma of skin, unspecified: Secondary | ICD-10-CM | POA: Diagnosis present

## 2021-10-01 DIAGNOSIS — Z7901 Long term (current) use of anticoagulants: Secondary | ICD-10-CM | POA: Insufficient documentation

## 2021-10-01 DIAGNOSIS — C9110 Chronic lymphocytic leukemia of B-cell type not having achieved remission: Secondary | ICD-10-CM | POA: Diagnosis present

## 2021-10-01 DIAGNOSIS — Z79899 Other long term (current) drug therapy: Secondary | ICD-10-CM | POA: Insufficient documentation

## 2021-10-01 MED ORDER — SODIUM CHLORIDE 0.9 % IV SOLN
510.0000 mg | Freq: Once | INTRAVENOUS | Status: AC
  Administered 2021-10-01: 510 mg via INTRAVENOUS
  Filled 2021-10-01: qty 510

## 2021-10-01 MED ORDER — SODIUM CHLORIDE 0.9 % IV SOLN: Freq: Once | INTRAVENOUS | Status: AC

## 2021-10-01 NOTE — Patient Instructions (Signed)

## 2021-10-06 ENCOUNTER — Encounter: Payer: Self-pay | Admitting: Oncology

## 2021-10-06 NOTE — Progress Notes (Signed)
[facility]  [address] [place],  [place]  [address] [phone]  Clinic Day: 10/07/21  Referring physician: Raelene Bott, MD  ASSESSMENT & PLAN:   Assessment & Plan: 1.  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 However, he had not had problems with splenomegaly, adenopathy, or thrombocytopenia, but his anemia has only partially improved with iron replacement, so may be more from his CLL.  As he is fairly stable, it is not urgent that he start treatment at this time. We plan acalabrutinib when he needs to start treatment, and his worsening lymphocytosis and anemia may make this necessary.  We are making sure he is stable on the immunotherapy first.   2.  Anemia, persistent and worsening. He last received IV Feraheme in July, and has recurrent iron deficiency. He will receive IV iron again tomorrrow.   3.  Hyperkalemia, which is chronic, but normal today with the medication from Dr. Heber [place], when we recheck it in a red top tube.  (It was 7.4 in the green top tube)   4.  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  This is a reported adverse effect and so we changed him to [place].  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  He tolerated this without difficulty and will receive his second dose later this week.  His white blood count is stable today, so we will consider treatment with a acalabrutinib and I have discussed this briefly with them.  I would like to make sure that he tolerates the second dose of Libtayo and then we will proceed with ordering acalabrutinib when he needs therapy for his CLL.  There are no negative interactions with his current  medications.  I will see him back in 3 weeks with CBC and CMP to see how he is tolerating the new medication.  We repeated iron studies and these are consistent with iron deficiency, so we have scheduled him for IV iron again, since his hemoglobin is dropping again. He is finally agreeable to a port so that will be arranged for next month.  The patient and his daughter understand the plans discussed today and are in agreement with them. They know to contact our office if he develops concerns prior to his next appointment.    [facility] [address] 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  The CLL was confirmed by flow cytometry in July 2017.  He has had slow progression of his lymphocytosis over time, with mild anemia, but has remained on observation.  He has multiple medical comorbidities including diabetes, congestive heart failure, B12 deficiency, cardiac arrhythmias, and history of prostate cancer. The white blood cell count has steadily increased over the years.  He was advised to start therapy years ago but we have opted for observation and he has  been largely asymptomatic until now.  In October 2021, he was found to have iron deficiency despite oral iron supplementation. B12 and folate levels were normal.  He received IV iron in form of Feraheme. Oral iron was discontinued, as we did not feel he was absorbing it. Oral B12 was continued.  His hemoglobin improved but did not normalize with the IV iron.  He received IV iron again in July 2022 when his hemoglobin was down to 9.8. The white blood  cell count has been running between 100,000 and 116,000 for the past year.  He has been relatively asymptomatic. He was diagnosed with atrial fibrillation.  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 INTERVAL HISTORY:  Fernando Young is here for routine follow up and states that he is doing fair. He complains of generalized weakness. Otherwise, he denies complaints.  The treatment for his multiple skin cancers has been changed to Libtayo, especially since he has developed a large new aggressive squamous cell carcinoma while on the oral therapy.  He tolerates this without difficulty and the skin cancers of his face and scalp are slowly improving.  They are beyond surgical resection at this time due to the widespread extension.  His white count has decreased from 142.7 to 118.3, and his hemoglobin has decreased from 9.1 to 8.7, and platelets are normal.  The potassium was read as abnormal but was 3.9 once this was rerun in a red top tube. His BUN is elevated at 29 but with a normal creatinine of 1.2 He was found to be iron deficient again and so will be receiving IV iron.  His  appetite is fair.  He denies fever, chills or other signs of infection.  He denies nausea, vomiting, bowel issues, or abdominal pain.  He denies sore throat, cough, dyspnea, or chest pain. He is accompanied by his daughter today as usual.  REVIEW OF SYSTEMS:  Review of Systems  Constitutional:  Negative for appetite change, chills, fatigue, fever and unexpected weight change.  HENT:  Negative.    Eyes: Negative.   Respiratory: Negative.  Negative for chest tightness, cough, hemoptysis, shortness of breath and wheezing.   Cardiovascular: Negative.  Negative for chest pain, leg swelling and palpitations.  Gastrointestinal: Negative.  Negative for abdominal distention, abdominal pain, blood  in stool, constipation, diarrhea, nausea and vomiting.  Endocrine: Negative.   Genitourinary: Negative.  Negative for difficulty urinating, dysuria, frequency and hematuria.   Musculoskeletal:  Negative for arthralgias, back pain, flank pain, gait problem and myalgias.  Skin: Negative.        Extensive skin cancers of the face  Neurological:  Positive for extremity weakness (in a wheelchair today). Negative for dizziness, gait problem, headaches, light-headedness, numbness, seizures and speech difficulty.  Hematological: Negative.   Psychiatric/Behavioral: Negative.  Negative for depression and sleep disturbance. The patient is not nervous/anxious.   All other systems reviewed and are negative.    VITALS:  Blood pressure (!) 102/58, pulse 65, temperature 97.8 F (36.6 C), temperature source Oral, resp. rate 18, height 5' 10.5" (1.791 m), weight 149 lb (67.6 kg), SpO2 96 %.  Wt Readings from Last 3 Encounters:  10/29/21 148 lb (67.1 kg)  10/28/21 144 lb 1.6 oz (65.4 kg)  10/08/21 149 lb (67.6 kg)    Body mass index is 21.08 kg/m.  Performance status (ECOG): 2 - Symptomatic, <50%  confined to bed  PHYSICAL EXAM:  Physical Exam Vitals and nursing note reviewed.  Constitutional:      General: He is not in acute distress.    Appearance: Normal appearance. He is normal weight.  HENT:     Head: Normocephalic and atraumatic.     Mouth/Throat:     Mouth: Mucous membranes are moist.     Pharynx: Oropharynx is clear. No oropharyngeal exudate or posterior oropharyngeal erythema.  Eyes:     General: No scleral icterus.    Extraocular Movements: Extraocular movements intact.     Conjunctiva/sclera: Conjunctivae normal.     Pupils: Pupils are equal, round, and reactive to light.  Cardiovascular:     Rate and Rhythm: Normal rate and regular rhythm.     Heart sounds: Heart sounds not distant. No murmur heard.    No friction rub. No gallop.  Pulmonary:     Effort: Pulmonary effort is  normal.     Breath sounds: Normal breath sounds. No wheezing, rhonchi or rales.  Abdominal:     General: Bowel sounds are normal. There is no distension.     Palpations: Abdomen is soft. There is no hepatomegaly, splenomegaly or mass.     Tenderness: There is no abdominal tenderness.  Musculoskeletal:        General: Normal range of motion.     Cervical back: Normal range of motion and neck supple. No tenderness.     Right lower leg: Edema present.     Left lower leg: Edema present.  Lymphadenopathy:     Cervical: No cervical adenopathy.     Upper Body:     Right upper body: No supraclavicular or axillary adenopathy.     Left upper body: No supraclavicular or axillary adenopathy.     Lower Body: No right inguinal adenopathy. No left inguinal adenopathy.  Skin:    General: Skin is warm and dry.     Coloration: Skin is not jaundiced.     Findings: Lesion (multiple nodular and erythematous skin lesions of the face, left greater than right. Some are keratotic.) present. No rash.     Comments: He has many widespread malignancies of his scalp and face which are red and peeling.  Neurological:     Mental Status: He is alert and oriented to person, place, and time.     Cranial Nerves: No cranial nerve deficit.  Psychiatric:        Mood and Affect: Mood normal.        Behavior: Behavior normal.        Thought Content: Thought content normal.     LABS:      Latest Ref Rng & Units 10/28/2021   12:00 AM 10/07/2021   12:00 AM 09/15/2021   12:00 AM  CBC  WBC  106.2     118.3     142.7      Hemoglobin 13.5 - 17.5 8.3     8.7     9.1      Hematocrit 41 - 53 '28     31     30      '$ Platelets 150 - 400 K/uL 229     194     237         This result is from an external source.      Latest Ref Rng & Units 10/28/2021   12:00 AM 10/07/2021   12:00 AM 09/15/2021   12:00 AM  CMP  BUN 4 - 21  37     29     29      Creatinine 0.6 - 1.3 1.2     1.2     1.0      Sodium 137 - 147 138     141     137       Potassium 3.5 - 5.1 mEq/L 5.5     3.9     7.4       4.0      Chloride 99 - 108 105     105     104      CO2 13 - '22 30     28     29      '$ Calcium 8.7 - 10.7 8.2     7.8     7.6      Alkaline Phos 25 - 125 59     64     60      AST 14 - 40 43     28     77      ALT 10 - 40 U/L '23     23     26         '$ This result is from an external source.   Multiple values from one day are sorted in reverse-chronological order    Lab Results  Component Value Date   TIBC 291 09/15/2021   TIBC 341 06/04/2021   TIBC 304 11/04/2020   FERRITIN 48 09/15/2021   FERRITIN 53 06/04/2021   FERRITIN 40.6 11/04/2020   IRONPCTSAT 13 (L) 09/15/2021   IRONPCTSAT 11 (L) 06/04/2021   IRONPCTSAT 11 (A) 11/04/2020   Lab Results  Component Value Date   LDH 166 08/03/2020    STUDIES:  No results found.    HISTORY:   Allergies: No Known Allergies  Current Medications: Current Outpatient Medications  Medication Sig Dispense Refill   amiodarone (PACERONE) 200 MG tablet Take 200 mg by mouth daily.     Ascorbic Acid (VITAMIN C) 500 MG CAPS Take 500 mg by mouth daily.     benazepril (LOTENSIN) 20 MG tablet Take 20 mg by mouth daily.     cholecalciferol (VITAMIN D3) 25 MCG (1000 UNIT) tablet Take 1,000 Units by mouth daily.     Dextromethorphan-guaiFENesin (CORICIDIN HBP CONGESTION/COUGH PO) Take 1 tablet by mouth every 12 (twelve) hours as needed (cold symptoms).     doxycycline (VIBRA-TABS) 100 MG tablet Take 100 mg by mouth 2 (two) times daily.     ELIQUIS 5 MG TABS tablet Take 5 mg by mouth 2 (two) times daily.     furosemide (LASIX) 20 MG tablet Take 20 mg by mouth 2 (two) times daily.     ibuprofen (ADVIL) 200 MG tablet Take 200 mg by mouth daily as needed for mild pain.     ketoconazole (NIZORAL) 2 % shampoo Apply topically 3 (three) times a week.     levothyroxine (SYNTHROID) 75 MCG tablet Take 75 mcg by mouth daily.     metFORMIN (GLUCOPHAGE-XR) 500 MG 24 hr tablet Take 500 mg by mouth 2 (two)  times daily.     Omega-3 Fatty Acids (FISH OIL) 1000 MG CAPS Take 1,000 mg by mouth daily.     rOPINIRole (REQUIP) 0.5 MG tablet Take 0.5-1 mg by mouth See admin instructions. Taking one tablet at bedtime, if no relief take an additional tablet     rosuvastatin (CRESTOR) 20 MG tablet Take 10 mg by mouth  daily.     vismodegib (ERIVEDGE) 150 MG capsule Take 150 mg by mouth daily.     vitamin B-12 (CYANOCOBALAMIN) 500 MCG tablet Take 500 mcg by mouth daily.     No current facility-administered medications for this visit.

## 2021-10-06 NOTE — Progress Notes (Deleted)
Fernando Young  [address] [place],  [place]  [address] [phone]  Clinic Day:  08/19/21  Referring physician: Raelene Bott, MD  ASSESSMENT & PLAN:   Assessment & Plan: 1.  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 However, he had not had problems with splenomegaly, adenopathy, or thrombocytopenia, but his anemia has only partially improved with iron replacement, so may be more from his CLL.  As he is fairly stable, it is not urgent that he start treatment at this time. We plan acalabrutinib if he needs to start treatment, and his worsening lymphocytosis and anemia may make this necessary   2.  Anemia, persistent and worsening. He last received IV Feraheme in July.    3.  Hyperkalemia, which is chronic, but normal today with the medication from Dr. Heber [place].     4.  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  This is a reported adverse effect and so we need to change him to [place]. I explained this issue to the patient and his daughter.  His white blood count is even worse today, so we will consider treatment with a acalabrutinib and I have discussed this briefly with them.  However we wish to make 1 change at a time so I feel the priority is to switch him from [place] to [place].  There are no negative interactions with his current medications and he will meet with the nurse practitioner to go over the potential toxicities.  Once he is stable on that medication, we will plan to initiate a acalabrutinib.  Otherwise, we will see him back in 3-4 weeks with CBC and CMP to see how he is tolerating the new medication.  I explained I would prefer to start 1 new medication at a time rather than change  everything all at once.  We will also plan to repeat iron studies when he returns since his hemoglobin is dropping again. The patient and his daughter understand the plans discussed today and are in agreement with them. They know to contact our office if he develops concerns prior to his next appointment.    [facility] [address] 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  The CLL was confirmed by flow cytometry in July 2017.  He has had slow progression of his lymphocytosis over time, with mild anemia, but has remained on observation.  He has multiple medical comorbidities including diabetes, congestive heart failure, B12 deficiency, cardiac arrhythmias, and history of prostate cancer. The white blood cell count has steadily increased over the years.  He was advised to start therapy years ago but we have opted for observation and he has been largely asymptomatic until now.  In October 2021, he was found to have iron deficiency despite oral iron supplementation. B12 and folate levels were normal.  He received IV iron in form of Feraheme. Oral  iron was discontinued, as we did not feel he was absorbing it. Oral B12 was continued.  His hemoglobin improved but did not normalize with the IV iron.  He received IV iron again in July 2022 when his hemoglobin was down to 9.8. The white blood cell count has been running between 100,000 and 116,000 for the past year.  He has been relatively asymptomatic. He was diagnosed with atrial fibrillation.  He has undergone multiple skin cancer surgeries for both basal cell  carcinoma and squamous cell carcinoma, and had seen a dermatology specialist at [facility], and has been taking Erivedge oral chemotherapy. INTERVAL HISTORY:  Fernando Young is here for routine follow up and states that he is doing fair.  He complains of generalized weakness. Otherwise, he denies complaints. He is taking Erivedge 150 mg daily for the skin cancers of his face.  It seems to be possibly helping, and he has not had severe toxicities.  However I was contacted by his [facility] dermatologist, Dr. Tyson Alias, and he is suggesting that the patient be changed to Libtayo, especially since he has developed a large new aggressive squamous cell carcinoma while on the oral therapy.  The skin cancers of his face and scalp are beyond surgical resection at this time due to the widespread extension.  His white count has decreased from 161,200 243,000 with an ALC of 130,000, and his hemoglobin has decreased to 9.4, and platelets are normal. Chemistries are unremarkable. His  appetite is fair, and he has gained 3 pounds since his last visit.  He denies fever, chills or other signs of infection.  He denies nausea, vomiting, bowel issues, or abdominal pain.  He denies sore throat, cough, dyspnea, or chest pain. He is accompanied by his daughter today as usual.  REVIEW OF SYSTEMS:  Review of Systems  Constitutional:  Negative for appetite change, chills, fatigue, fever and unexpected weight change.  HENT:  Negative.    Eyes: Negative.   Respiratory: Negative.  Negative for chest tightness, cough, hemoptysis, shortness of breath and wheezing.   Cardiovascular: Negative.  Negative for chest pain, leg swelling and palpitations.  Gastrointestinal: Negative.  Negative for abdominal distention, abdominal pain, blood in stool, constipation, diarrhea, nausea and vomiting.  Endocrine: Negative.   Genitourinary: Negative.  Negative for difficulty urinating, dysuria, frequency and hematuria.   Musculoskeletal:  Negative for  arthralgias, back pain, flank pain, gait problem and myalgias.  Skin: Negative.        Extensive skin cancers of the face  Neurological:  Positive for extremity weakness (in a wheelchair today). Negative for dizziness, gait problem, headaches, light-headedness, numbness, seizures and speech difficulty.  Hematological: Negative.   Psychiatric/Behavioral: Negative.  Negative for depression and sleep disturbance. The patient is not nervous/anxious.   All other systems reviewed and are negative.   VITALS:  There were no vitals taken for this visit.  Wt Readings from Last 3 Encounters:  10/01/21 145 lb (65.8 kg)  09/17/21 147 lb 14.4 oz (67.1 kg)  09/15/21 147 lb 11.2 oz (67 kg)    There is no height or weight on file to calculate BMI.  Performance status (ECOG): 2 - Symptomatic, <50% confined to bed  PHYSICAL EXAM:  Physical Exam Vitals and nursing note reviewed.  Constitutional:      General: He is not in acute distress.    Appearance: Normal appearance. He is normal weight.  HENT:     Head: Normocephalic and atraumatic.     Mouth/Throat:  Mouth: Mucous membranes are moist.     Pharynx: Oropharynx is clear. No oropharyngeal exudate or posterior oropharyngeal erythema.  Eyes:     General: No scleral icterus.    Extraocular Movements: Extraocular movements intact.     Conjunctiva/sclera: Conjunctivae normal.     Pupils: Pupils are equal, round, and reactive to light.  Cardiovascular:     Rate and Rhythm: Normal rate and regular rhythm.     Heart sounds: Heart sounds not distant. No murmur heard.   No friction rub. No gallop.  Pulmonary:     Effort: Pulmonary effort is normal.     Breath sounds: Normal breath sounds. No wheezing, rhonchi or rales.  Abdominal:     General: Bowel sounds are normal. There is no distension.     Palpations: Abdomen is soft. There is no hepatomegaly, splenomegaly or mass.     Tenderness: There is no abdominal tenderness.  Musculoskeletal:         General: Normal range of motion.     Cervical back: Normal range of motion and neck supple. No tenderness.     Right lower leg: Edema present.     Left lower leg: Edema present.  Lymphadenopathy:     Cervical: No cervical adenopathy.     Upper Body:     Right upper body: No supraclavicular or axillary adenopathy.     Left upper body: No supraclavicular or axillary adenopathy.     Lower Body: No right inguinal adenopathy. No left inguinal adenopathy.  Skin:    General: Skin is warm and dry.     Coloration: Skin is not jaundiced.     Findings: Lesion (multiple nodular and erythematous skin lesions of the face, left greater than right. Some are keratotic.) present. No rash.     Comments: He has many widespread malignancies of his scalp and face which are red and peeling.  Neurological:     Mental Status: He is alert and oriented to person, place, and time.     Cranial Nerves: No cranial nerve deficit.  Psychiatric:        Mood and Affect: Mood normal.        Behavior: Behavior normal.        Thought Content: Thought content normal.    LABS:      Latest Ref Rng & Units 09/15/2021   12:00 AM 08/23/2021   12:00 AM 08/05/2021   12:00 AM  CBC  WBC  142.7      143.0      161.2       Hemoglobin 13.5 - 17.5 9.1      9.4      9.5       Hematocrit 41 - 53 30      31      32       Platelets 150 - 400 K/uL 237      213      206          This result is from an external source.       Latest Ref Rng & Units 09/15/2021   12:00 AM 08/23/2021   12:00 AM 08/05/2021   12:00 AM  CMP  BUN 4 - '21 29      27      28       '$ Creatinine 0.6 - 1.3 1.0      0.9      1.2       Sodium 137 - 147  137      142      141       Potassium 3.5 - 5.1 mEq/L 7.4        4.0      3.8      3.8       Chloride 99 - 108 104      106      107       CO2 13 - '22 29      27      28       '$ Calcium 8.7 - 10.7 7.6      8.1      7.9       Total Protein 6.3 - 8.2 g/dL  5.4        Alkaline Phos 25 - 125 60      70      62        AST 14 - 40 77      28      27       ALT 10 - 40 U/L '26      26      27          '$ This result is from an external source.   Multiple values from one day are sorted in reverse-chronological order     Lab Results  Component Value Date   TIBC 291 09/15/2021   TIBC 341 06/04/2021   TIBC 304 11/04/2020   FERRITIN 48 09/15/2021   FERRITIN 53 06/04/2021   FERRITIN 40.6 11/04/2020   IRONPCTSAT 13 (L) 09/15/2021   IRONPCTSAT 11 (L) 06/04/2021   IRONPCTSAT 11 (A) 11/04/2020   Lab Results  Component Value Date   LDH 166 08/03/2020    STUDIES:  No results found.    HISTORY:   Allergies: No Known Allergies  Current Medications: Current Outpatient Medications  Medication Sig Dispense Refill  . amiodarone (PACERONE) 200 MG tablet Take 200 mg by mouth daily.    . Ascorbic Acid (VITAMIN C) 500 MG CAPS Take 500 mg by mouth daily.    . benazepril (LOTENSIN) 20 MG tablet Take 20 mg by mouth daily.    . cholecalciferol (VITAMIN D3) 25 MCG (1000 UNIT) tablet Take 1,000 Units by mouth daily.    [place] Dextromethorphan-guaiFENesin (CORICIDIN HBP CONGESTION/COUGH PO) Take 1 tablet by mouth every 12 (twelve) hours as needed (cold symptoms).    [place] doxycycline (VIBRA-TABS) 100 MG tablet Take 100 mg by mouth 2 (two) times daily.    [place] ELIQUIS 5 MG TABS tablet Take 5 mg by mouth 2 (two) times daily.    . furosemide (LASIX) 20 MG tablet Take 20 mg by mouth 2 (two) times daily.    [place] ibuprofen (ADVIL) 200 MG tablet Take 200 mg by mouth daily as needed for mild pain.    [place] ketoconazole (NIZORAL) 2 % shampoo Apply topically 3 (three) times a week.    . levothyroxine (SYNTHROID) 75 MCG tablet Take 75 mcg by mouth daily.    . metFORMIN (GLUCOPHAGE-XR) 500 MG 24 hr tablet Take 500 mg by mouth 2 (two) times daily.    . Omega-3 Fatty Acids (FISH OIL) 1000 MG CAPS Take 1,000 mg by mouth daily.    [place] rOPINIRole (REQUIP) 0.5 MG tablet Take 0.5-1 mg by mouth See admin instructions. Taking one tablet at bedtime, if no  relief take an additional tablet    . rosuvastatin (CRESTOR) 20 MG tablet Take 10 mg by  mouth daily.    . vismodegib (ERIVEDGE) 150 MG capsule Take 150 mg by mouth daily.    . vitamin B-12 (CYANOCOBALAMIN) 500 MCG tablet Take 500 mcg by mouth daily.     No current facility-administered medications for this visit.

## 2021-10-07 ENCOUNTER — Other Ambulatory Visit: Payer: Self-pay | Admitting: Oncology

## 2021-10-07 ENCOUNTER — Other Ambulatory Visit: Payer: Self-pay

## 2021-10-07 ENCOUNTER — Inpatient Hospital Stay: Payer: Medicare Other

## 2021-10-07 ENCOUNTER — Inpatient Hospital Stay: Payer: Medicare Other | Admitting: Oncology

## 2021-10-07 ENCOUNTER — Encounter: Payer: Self-pay | Admitting: Oncology

## 2021-10-07 VITALS — BP 102/58 | HR 65 | Temp 97.8°F | Resp 18 | Ht 70.5 in | Wt 149.0 lb

## 2021-10-07 DIAGNOSIS — C4491 Basal cell carcinoma of skin, unspecified: Secondary | ICD-10-CM

## 2021-10-07 DIAGNOSIS — Z5112 Encounter for antineoplastic immunotherapy: Secondary | ICD-10-CM | POA: Diagnosis not present

## 2021-10-07 DIAGNOSIS — C9110 Chronic lymphocytic leukemia of B-cell type not having achieved remission: Secondary | ICD-10-CM | POA: Diagnosis not present

## 2021-10-07 LAB — CBC: RBC: 3.22 — AB (ref 3.87–5.11)

## 2021-10-07 LAB — COMPREHENSIVE METABOLIC PANEL
Albumin: 3.2 — AB (ref 3.5–5.0)
Calcium: 7.8 — AB (ref 8.7–10.7)

## 2021-10-07 LAB — BASIC METABOLIC PANEL
BUN: 29 — AB (ref 4–21)
CO2: 28 — AB (ref 13–22)
Chloride: 105 (ref 99–108)
Creatinine: 1.2 (ref 0.6–1.3)
Glucose: 92
Potassium: 3.9 mEq/L (ref 3.5–5.1)
Sodium: 141 (ref 137–147)

## 2021-10-07 LAB — CBC AND DIFFERENTIAL
HCT: 31 — AB (ref 41–53)
Hemoglobin: 8.7 — AB (ref 13.5–17.5)
Platelets: 194 10*3/uL (ref 150–400)
WBC: 118.3

## 2021-10-07 LAB — HEPATIC FUNCTION PANEL
ALT: 23 U/L (ref 10–40)
AST: 28 (ref 14–40)
Alkaline Phosphatase: 64 (ref 25–125)
Bilirubin, Total: 0.4

## 2021-10-07 LAB — TSH: TSH: 1.953 u[IU]/mL (ref 0.350–4.500)

## 2021-10-08 ENCOUNTER — Encounter: Payer: Self-pay | Admitting: Oncology

## 2021-10-08 ENCOUNTER — Ambulatory Visit: Payer: Medicare Other

## 2021-10-08 ENCOUNTER — Inpatient Hospital Stay: Payer: Medicare Other

## 2021-10-08 VITALS — BP 100/59 | HR 70 | Temp 98.1°F | Resp 18 | Ht 70.5 in | Wt 149.0 lb

## 2021-10-08 DIAGNOSIS — D509 Iron deficiency anemia, unspecified: Secondary | ICD-10-CM

## 2021-10-08 DIAGNOSIS — Z5112 Encounter for antineoplastic immunotherapy: Secondary | ICD-10-CM | POA: Diagnosis not present

## 2021-10-08 DIAGNOSIS — C4491 Basal cell carcinoma of skin, unspecified: Secondary | ICD-10-CM

## 2021-10-08 LAB — T4: T4, Total: 8.7 ug/dL (ref 4.5–12.0)

## 2021-10-08 MED ORDER — SODIUM CHLORIDE 0.9% FLUSH: 10.0000 mL | INTRAVENOUS | Status: DC | PRN

## 2021-10-08 MED ORDER — SODIUM CHLORIDE 0.9 % IV SOLN
510.0000 mg | Freq: Once | INTRAVENOUS | Status: AC
  Administered 2021-10-08: 510 mg via INTRAVENOUS
  Filled 2021-10-08: qty 510

## 2021-10-08 MED ORDER — SODIUM CHLORIDE 0.9 % IV SOLN
350.0000 mg | Freq: Once | INTRAVENOUS | Status: AC
  Administered 2021-10-08: 350 mg via INTRAVENOUS
  Filled 2021-10-08: qty 7

## 2021-10-08 MED ORDER — SODIUM CHLORIDE 0.9 % IV SOLN: Freq: Once | INTRAVENOUS | Status: AC

## 2021-10-08 MED ORDER — HEPARIN SOD (PORK) LOCK FLUSH 100 UNIT/ML IV SOLN: 500.0000 [IU] | Freq: Once | INTRAVENOUS | Status: DC | PRN

## 2021-10-08 NOTE — Patient Instructions (Signed)
Cemiplimab Injection What is this medication? CEMIPLIMAB (se MIP li mab) treats skin cancer and lung cancer. It works by blocking a protein that causes cancer cells to grow and multiply. This helps to slow or stop the spread of cancer cells. It is a monoclonal antibody. This medicine may be used for other purposes; ask your health care provider or pharmacist if you have questions. COMMON BRAND NAME(S): LIBTAYO What should I tell my care team before I take this medication? They need to know if you have any of these conditions: Allogeneic stem cell transplant (uses someone else's stem cells) Autoimmune diseases, such as Crohn's disease, ulcerative colitis, or lupus Diabetes Nervous system problems, such as myasthenia gravis or Guillain-Barre syndrome Organ transplant Recent or ongoing radiation Thyroid disease An unusual or allergic reaction to cemiplimab, other medications, foods, dyes, or preservatives Pregnant or trying to get pregnant Breast-feeding How should I use this medication? This medication is injected into a vein. It is given by your care team in a hospital or clinic setting. A special MedGuide will be given to you before each treatment. Be sure to read this information carefully each time. Talk to your care team about the use of this medication in children. Special care may be needed. Overdosage: If you think you have taken too much of this medicine contact a poison control center or emergency room at once. NOTE: This medicine is only for you. Do not share this medicine with others. What if I miss a dose? Keep appointments for follow-up doses. It is important not to miss your dose. Call your care team if you are unable to keep an appointment. What may interact with this medication? Interactions have not been studied. This list may not describe all possible interactions. Give your health care provider a list of all the medicines, herbs, non-prescription drugs, or dietary  supplements you use. Also tell them if you smoke, drink alcohol, or use illegal drugs. Some items may interact with your medicine. What should I watch for while using this medication? This medication may make you feel generally unwell. This is not uncommon as chemotherapy can affect healthy cells as well as cancer cells. Report any side effects. Continue your course of treatment even though you feel ill until your care team tells you to stop. You may need blood work done while you are taking this medication. This medication may cause serious skin reactions. They can happen in the weeks to months after starting the medication. Contact your care team right away if you notice fevers or flu-like symptoms with a rash. The rash may be red or purple and then turn into blisters or peeling of the skin. You may also notice a red rash with swelling of the face, lips, or lymph nodes in your neck or under your arms. Tell your care team right away if you have any changes in your vision. This medication may increase blood sugar. The risk may be higher in patients who already have diabetes. Ask your care team what you can do to lower your risk of diabetes while taking this medication. Talk to your care team if you wish to become pregnant or think you might be pregnant. This medication can cause serious birth defects if taken during pregnancy. A negative pregnancy test is required before starting this medication. A reliable form of contraception is recommended while taking this medication and for at least 4 months after stopping it. Do not breast-feed while taking this medication and for at least 4   months after stopping it. What side effects may I notice from receiving this medication? Side effects that you should report to your care team as soon as possible: Allergic reactions--skin rash, itching, hives, swelling of the face, lips, tongue, or throat Change in vision Dry cough, shortness of breath or trouble  breathing Fever, neck pain or stiffness, sensitivity to light, headache, nausea, vomiting, confusion Headache, double vision, blurry vision, sensitivity to light, eye pain Heart muscle inflammation--unusual weakness or fatigue, shortness of breath, chest pain, fast or irregular heartbeat, dizziness, swelling of the ankles, feet, or hands High blood sugar (hyperglycemia)--increased thirst or amount of urine, unusual weakness or fatigue, blurry vision High thyroid levels (hyperthyroidism)--fast or irregular heartbeat, weight loss, excessive sweating or sensitivity to heat, tremors or shaking, anxiety, nervousness, irregular menstrual cycle or spotting Infusion reactions--chest pain, shortness of breath or trouble breathing, feeling faint or lightheaded Kidney injury (glomerulonephritis)--decrease in the amount of urine, red or dark brown urine, foamy or bubbly urine, swelling of the ankles, hands, or feet Liver injury--right upper belly pain, loss of appetite, nausea, light-colored stool, dark yellow or brown urine, yellowing skin or eyes, unusual weakness or fatigue Low adrenal gland function--nausea, vomiting, loss of appetite, unusual weakness or fatigue, dizziness Low red blood cell level--unusual weakness or fatigue, dizziness, headache, trouble breathing Low thyroid levels (hypothyroidism)--unusual weakness or fatigue, increased sensitivity to cold, constipation, hair loss, dry skin, weight gain, feelings of depression Pain, tingling, or numbness in the hands or feet, muscle weakness, trouble walking, loss of balance or coordination Rash, fever, and swollen lymph nodes Redness, blistering, peeling, or loosening of the skin, including inside the mouth Sudden or severe stomach pain, bloody diarrhea, fever, nausea, vomiting Unusual bruising or bleeding Side effects that usually do not require medical attention (report these to your care team if they continue or are bothersome): Bone  pain Diarrhea Muscle pain This list may not describe all possible side effects. Call your doctor for medical advice about side effects. You may report side effects to FDA at 1-800-FDA-1088. Where should I keep my medication? This medication is given in a hospital or clinic and will not be stored at home. NOTE: This sheet is a summary. It may not cover all possible information. If you have questions about this medicine, talk to your doctor, pharmacist, or health care provider.  2023 Elsevier/Gold Standard (2021-04-09 00:00:00) 

## 2021-10-28 ENCOUNTER — Inpatient Hospital Stay: Payer: Medicare Other

## 2021-10-28 ENCOUNTER — Telehealth: Payer: Self-pay

## 2021-10-28 ENCOUNTER — Encounter: Payer: Self-pay | Admitting: Oncology

## 2021-10-28 ENCOUNTER — Inpatient Hospital Stay: Payer: Medicare Other | Admitting: Oncology

## 2021-10-28 VITALS — BP 93/50 | HR 69 | Temp 97.4°F | Resp 18 | Ht 70.5 in | Wt 144.1 lb

## 2021-10-28 DIAGNOSIS — C9110 Chronic lymphocytic leukemia of B-cell type not having achieved remission: Secondary | ICD-10-CM

## 2021-10-28 DIAGNOSIS — C4491 Basal cell carcinoma of skin, unspecified: Secondary | ICD-10-CM

## 2021-10-28 DIAGNOSIS — Z5112 Encounter for antineoplastic immunotherapy: Secondary | ICD-10-CM | POA: Diagnosis not present

## 2021-10-28 LAB — PROTIME-INR
INR: 1.2 (ref 0.8–1.2)
Prothrombin Time: 15.5 seconds — ABNORMAL HIGH (ref 11.4–15.2)

## 2021-10-28 LAB — BASIC METABOLIC PANEL
BUN: 37 — AB (ref 4–21)
CO2: 30 — AB (ref 13–22)
Chloride: 105 (ref 99–108)
Creatinine: 1.2 (ref 0.6–1.3)
Glucose: 100
Potassium: 5.5 mEq/L — AB (ref 3.5–5.1)
Sodium: 138 (ref 137–147)

## 2021-10-28 LAB — TSH: TSH: 3.061 u[IU]/mL (ref 0.350–4.500)

## 2021-10-28 LAB — HEPATIC FUNCTION PANEL
ALT: 23 U/L (ref 10–40)
AST: 43 — AB (ref 14–40)
Alkaline Phosphatase: 59 (ref 25–125)
Bilirubin, Total: 0.4

## 2021-10-28 LAB — APTT: aPTT: 31 seconds (ref 24–36)

## 2021-10-28 LAB — COMPREHENSIVE METABOLIC PANEL
Albumin: 3.3 — AB (ref 3.5–5.0)
Calcium: 8.2 — AB (ref 8.7–10.7)

## 2021-10-28 LAB — CBC AND DIFFERENTIAL
HCT: 28 — AB (ref 41–53)
Hemoglobin: 8.3 — AB (ref 13.5–17.5)
Neutrophils Absolute: 5.31
Platelets: 229 10*3/uL (ref 150–400)
WBC: 106.2

## 2021-10-28 LAB — CBC: RBC: 2.92 — AB (ref 3.87–5.11)

## 2021-10-28 MED FILL — Cemiplimab-rwlc IV Soln 350 MG/7ML (50 MG/ML): INTRAVENOUS | Qty: 7 | Status: AC

## 2021-10-28 NOTE — Progress Notes (Signed)
[facility]  [address] [place],  [place]  [address] [phone]  Clinic Day: 10/28/21  Referring physician: Raelene Bott, MD  ASSESSMENT & PLAN:   Assessment & Plan: 1.  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 However, he had not had problems with splenomegaly, adenopathy, or thrombocytopenia, but his anemia has only partially improved with iron replacement, so may be more from his CLL.  As he is fairly stable, it is not urgent that he start treatment at this time. We plan acalabrutinib when he needs to start treatment, and his worsening lymphocytosis and anemia may make this necessary.  We are making sure he is stable on the immunotherapy first.   2.  Anemia, persistent and worsening. He last received IV Feraheme in July of 2022, and had recurrent iron deficiency,so was treated again 2 weeks ago.    3.  Hyperkalemia, which is chronic, but normal today with the medication from Dr. Heber [place] when we recheck it in a red top tube.  (It was 7.4 in the green top tube)   4.  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  This is a reported adverse effect and so we changed him to [place].  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  He tolerated this without difficulty and it appears to be helping.  His white blood count is stable today, so we will postpone treatment with a acalabrutinib and I have discussed this briefly with them.  I would like to make sure that he tolerates the Libtayo and then we will proceed with ordering acalabrutinib.  There are no negative interactions with his current medications.  Otherwise, we will see him back in 3 weeks with CBC and CMP  to see how he is tolerating the new medication.  We repeated iron studies and these were consistent with iron deficiency, so he received IV iron again, since his hemoglobin is dropping again. The patient and his daughter understand the plans discussed today and are in agreement with them. They know to contact our office if he develops concerns prior to his next appointment.    [facility] [address] [place] [address] Dept: [phone] Dept Fax: [phone]      CHIEF COMPLAINT:  CC: Chronic lymphocytic leukemia, unresectable skin cancers of the scalp and face  Current Treatment: Libtayo   HISTORY OF PRESENT ILLNESS:  Fernando Young is a 81 y.o. 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  The CLL was confirmed by flow cytometry in July 2017.  He has had slow progression of his lymphocytosis over time, with mild anemia, but has remained on observation.  He has multiple medical comorbidities including diabetes, congestive heart failure, B12 deficiency, cardiac arrhythmias, and history of prostate cancer. The white blood cell count has steadily increased over the years.  He was advised to start therapy years ago but we have opted for observation and he has been largely asymptomatic until now.  In October 2021, he was found to have iron deficiency despite oral iron supplementation. B12 and folate levels were normal.  He received  IV iron in form of Feraheme. Oral iron was discontinued, as we did not feel he was absorbing it. Oral B12 was continued.  His hemoglobin improved but did not normalize with the IV iron.  He received IV iron again in July 2022 when his hemoglobin was down to 9.8. The white blood cell count has been running between 100,000 and 116,000 for the past year.  He has been relatively asymptomatic. He was diagnosed with atrial  fibrillation.  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 INTERVAL HISTORY:  Fernando Young is here for routine follow up and states that he is doing fair. He will have a port placed. He complains of generalized weakness and has had a fall. Otherwise, he denies complaints.  The treatment for his multiple skin cancers has been changed to Libtayo, especially since he has developed a large new aggressive squamous cell carcinoma while on the oral therapy.  He tolerates this without difficulty and his skin cancers of his face and scalp are beyond surgical resection at this time due to the widespread extension.  His white count is improved, from 118,000 to 106,000,  and his hemoglobin has decreased from 9.1 to 8.3, and platelets are normal.  His  appetite is fair.  He denies fever, chills or other signs of infection.  He denies nausea, vomiting, bowel issues, or abdominal pain.  He denies sore throat, cough, dyspnea, or chest pain. He is accompanied by his daughter today as usual.  REVIEW OF SYSTEMS:  Review of Systems  Constitutional:  Negative for appetite change, chills, fatigue, fever and unexpected weight change.  HENT:  Negative.    Eyes: Negative.   Respiratory: Negative.  Negative for chest tightness, cough, hemoptysis, shortness of breath and wheezing.   Cardiovascular: Negative.  Negative for chest pain, leg swelling and palpitations.  Gastrointestinal: Negative.  Negative for abdominal distention, abdominal pain, blood in stool, constipation, diarrhea, nausea and vomiting.  Endocrine: Negative.   Genitourinary: Negative.  Negative for difficulty urinating, dysuria, frequency and hematuria.   Musculoskeletal:  Negative for arthralgias, back pain, flank pain, gait problem and myalgias.  Skin: Negative.        Extensive skin cancers of the face   Neurological:  Positive for extremity weakness (in a wheelchair today). Negative for dizziness, gait problem, headaches, light-headedness, numbness, seizures and speech difficulty.  Hematological: Negative.   Psychiatric/Behavioral: Negative.  Negative for depression and sleep disturbance. The patient is not nervous/anxious.   All other systems reviewed and are negative.    VITALS:  Blood pressure (!) 93/50, pulse 69, temperature (!) 97.4 F (36.3 C), temperature source Oral, resp. rate 18, height 5' 10.5" (1.791 m), weight 144 lb 1.6 oz (65.4 kg), SpO2 96 %.  Wt Readings from Last 3 Encounters:  11/19/21 145 lb (65.8 kg)  11/18/21 145 lb 1 oz (65.8 kg)  11/17/21 145 lb 6.4 oz (66 kg)    Body mass index is 20.38 kg/m.  Performance status (ECOG): 2 - Symptomatic, <50% confined to bed  PHYSICAL EXAM:  Physical Exam Vitals and nursing note reviewed.  Constitutional:      General: He is not in acute distress.    Appearance: Normal appearance. He is normal weight.  HENT:     Head: Normocephalic and atraumatic.     Mouth/Throat:     Mouth: Mucous membranes are moist.  Pharynx: Oropharynx is clear. No oropharyngeal exudate or posterior oropharyngeal erythema.  Eyes:     General: No scleral icterus.    Extraocular Movements: Extraocular movements intact.     Conjunctiva/sclera: Conjunctivae normal.     Pupils: Pupils are equal, round, and reactive to light.  Cardiovascular:     Rate and Rhythm: Normal rate and regular rhythm.     Heart sounds: Heart sounds not distant. No murmur heard.    No friction rub. No gallop.  Pulmonary:     Effort: Pulmonary effort is normal.     Breath sounds: Normal breath sounds. No wheezing, rhonchi or rales.  Abdominal:     General: Bowel sounds are normal. There is no distension.     Palpations: Abdomen is soft. There is no hepatomegaly, splenomegaly or mass.     Tenderness: There is no abdominal tenderness.  Musculoskeletal:        General:  Normal range of motion.     Cervical back: Normal range of motion and neck supple. No tenderness.     Right lower leg: Edema present.     Left lower leg: Edema present.  Lymphadenopathy:     Cervical: No cervical adenopathy.     Upper Body:     Right upper body: No supraclavicular or axillary adenopathy.     Left upper body: No supraclavicular or axillary adenopathy.     Lower Body: No right inguinal adenopathy. No left inguinal adenopathy.  Skin:    General: Skin is warm and dry.     Coloration: Skin is not jaundiced.     Findings: Lesion (multiple nodular and erythematous skin lesions of the face, left greater than right. Some are keratotic.) present. No rash.     Comments: He has many widespread malignancies of his scalp and face which are red and peeling.  Neurological:     Mental Status: He is alert and oriented to person, place, and time.     Cranial Nerves: No cranial nerve deficit.  Psychiatric:        Mood and Affect: Mood normal.        Behavior: Behavior normal.        Thought Content: Thought content normal.     LABS:      Latest Ref Rng & Units 11/17/2021   12:00 AM 10/28/2021   12:00 AM 10/07/2021   12:00 AM  CBC  WBC  115.6     106.2     118.3      Hemoglobin 13.5 - 17.5 7.5     8.3     8.7      Hematocrit 41 - 53 '26     28     31      '$ Platelets 150 - 400 K/uL 188     229     194         This result is from an external source.      Latest Ref Rng & Units 11/17/2021   12:00 AM 10/28/2021   12:00 AM 10/07/2021   12:00 AM  CMP  BUN 4 - 21 28     37     29      Creatinine 0.6 - 1.3 1.1     1.2     1.2      Sodium 137 - 147 139     138     141      Potassium 3.5 - 5.1 mEq/L 4.1  5.5     3.9      Chloride 99 - 108 106     105     105      CO2 13 - '22 26     30     28      '$ Calcium 8.7 - 10.7 8.4     8.2     7.8      Total Protein 6.3 - 8.2 g/dL 5.5        Alkaline Phos 25 - 125 72     59     64      AST 14 - 40 33     43     28      ALT 10 - 40 U/L 33     23      23         This result is from an external source.    Lab Results  Component Value Date   TIBC 291 09/15/2021   TIBC 341 06/04/2021   TIBC 304 11/04/2020   FERRITIN 48 09/15/2021   FERRITIN 53 06/04/2021   FERRITIN 40.6 11/04/2020   IRONPCTSAT 13 (L) 09/15/2021   IRONPCTSAT 11 (L) 06/04/2021   IRONPCTSAT 11 (A) 11/04/2020   Lab Results  Component Value Date   LDH 166 08/03/2020    STUDIES:  No results found.    HISTORY:   Allergies: No Known Allergies  Current Medications: Current Outpatient Medications  Medication Sig Dispense Refill   acalabrutinib maleate 100 MG TABS Take 100 mg by mouth 2 (two) times daily. 60 tablet 2   amiodarone (PACERONE) 200 MG tablet Take 200 mg by mouth daily.     Ascorbic Acid (VITAMIN C) 500 MG CAPS Take 500 mg by mouth daily.     benazepril (LOTENSIN) 20 MG tablet Take 20 mg by mouth daily.     cholecalciferol (VITAMIN D3) 25 MCG (1000 UNIT) tablet Take 1,000 Units by mouth daily.     Dextromethorphan-guaiFENesin (CORICIDIN HBP CONGESTION/COUGH PO) Take 1 tablet by mouth every 12 (twelve) hours as needed (cold symptoms).     ELIQUIS 5 MG TABS tablet Take 5 mg by mouth 2 (two) times daily.     furosemide (LASIX) 20 MG tablet Take 20 mg by mouth 2 (two) times daily.     ibuprofen (ADVIL) 200 MG tablet Take 200 mg by mouth daily as needed for mild pain.     ketoconazole (NIZORAL) 2 % shampoo Apply topically 3 (three) times a week.     levothyroxine (SYNTHROID) 75 MCG tablet Take 75 mcg by mouth daily.     metFORMIN (GLUCOPHAGE-XR) 500 MG 24 hr tablet Take 500 mg by mouth 2 (two) times daily.     Omega-3 Fatty Acids (FISH OIL) 1000 MG CAPS Take 1,000 mg by mouth daily.     rOPINIRole (REQUIP) 0.5 MG tablet Take 0.5-1 mg by mouth See admin instructions. Taking one tablet at bedtime, if no relief take an additional tablet     rosuvastatin (CRESTOR) 20 MG tablet Take 10 mg by mouth daily.     vitamin B-12 (CYANOCOBALAMIN) 500 MCG tablet Take  500 mcg by mouth daily.     No current facility-administered medications for this visit.

## 2021-10-29 ENCOUNTER — Inpatient Hospital Stay: Payer: Medicare Other

## 2021-10-29 ENCOUNTER — Encounter: Payer: Self-pay | Admitting: Oncology

## 2021-10-29 ENCOUNTER — Other Ambulatory Visit: Payer: Self-pay | Admitting: Oncology

## 2021-10-29 ENCOUNTER — Telehealth: Payer: Self-pay | Admitting: Oncology

## 2021-10-29 VITALS — BP 98/58 | HR 72 | Temp 98.1°F | Resp 18 | Ht 70.5 in | Wt 148.0 lb

## 2021-10-29 DIAGNOSIS — Z5112 Encounter for antineoplastic immunotherapy: Secondary | ICD-10-CM | POA: Diagnosis not present

## 2021-10-29 DIAGNOSIS — C4491 Basal cell carcinoma of skin, unspecified: Secondary | ICD-10-CM

## 2021-10-29 LAB — T4: T4, Total: 8.6 ug/dL (ref 4.5–12.0)

## 2021-10-29 MED ORDER — SODIUM CHLORIDE 0.9 % IV SOLN
350.0000 mg | Freq: Once | INTRAVENOUS | Status: AC
  Administered 2021-10-29: 350 mg via INTRAVENOUS
  Filled 2021-10-29: qty 7

## 2021-10-29 MED ORDER — SODIUM CHLORIDE 0.9 % IV SOLN: Freq: Once | INTRAVENOUS | Status: AC

## 2021-10-29 NOTE — Patient Instructions (Signed)
Cemiplimab Injection What is this medication? CEMIPLIMAB (se MIP li mab) treats skin cancer and lung cancer. It works by blocking a protein that causes cancer cells to grow and multiply. This helps to slow or stop the spread of cancer cells. It is a monoclonal antibody. This medicine may be used for other purposes; ask your health care provider or pharmacist if you have questions. COMMON BRAND NAME(S): LIBTAYO What should I tell my care team before I take this medication? They need to know if you have any of these conditions: Allogeneic stem cell transplant (uses someone else's stem cells) Autoimmune diseases, such as Crohn's disease, ulcerative colitis, or lupus Diabetes Nervous system problems, such as myasthenia gravis or Guillain-Barre syndrome Organ transplant Recent or ongoing radiation Thyroid disease An unusual or allergic reaction to cemiplimab, other medications, foods, dyes, or preservatives Pregnant or trying to get pregnant Breast-feeding How should I use this medication? This medication is injected into a vein. It is given by your care team in a hospital or clinic setting. A special MedGuide will be given to you before each treatment. Be sure to read this information carefully each time. Talk to your care team about the use of this medication in children. Special care may be needed. Overdosage: If you think you have taken too much of this medicine contact a poison control center or emergency room at once. NOTE: This medicine is only for you. Do not share this medicine with others. What if I miss a dose? Keep appointments for follow-up doses. It is important not to miss your dose. Call your care team if you are unable to keep an appointment. What may interact with this medication? Interactions have not been studied. This list may not describe all possible interactions. Give your health care provider a list of all the medicines, herbs, non-prescription drugs, or dietary  supplements you use. Also tell them if you smoke, drink alcohol, or use illegal drugs. Some items may interact with your medicine. What should I watch for while using this medication? This medication may make you feel generally unwell. This is not uncommon as chemotherapy can affect healthy cells as well as cancer cells. Report any side effects. Continue your course of treatment even though you feel ill until your care team tells you to stop. You may need blood work done while you are taking this medication. This medication may cause serious skin reactions. They can happen in the weeks to months after starting the medication. Contact your care team right away if you notice fevers or flu-like symptoms with a rash. The rash may be red or purple and then turn into blisters or peeling of the skin. You may also notice a red rash with swelling of the face, lips, or lymph nodes in your neck or under your arms. Tell your care team right away if you have any changes in your vision. This medication may increase blood sugar. The risk may be higher in patients who already have diabetes. Ask your care team what you can do to lower your risk of diabetes while taking this medication. Talk to your care team if you wish to become pregnant or think you might be pregnant. This medication can cause serious birth defects if taken during pregnancy. A negative pregnancy test is required before starting this medication. A reliable form of contraception is recommended while taking this medication and for at least 4 months after stopping it. Do not breast-feed while taking this medication and for at least 4   months after stopping it. What side effects may I notice from receiving this medication? Side effects that you should report to your care team as soon as possible: Allergic reactions--skin rash, itching, hives, swelling of the face, lips, tongue, or throat Change in vision Dry cough, shortness of breath or trouble  breathing Fever, neck pain or stiffness, sensitivity to light, headache, nausea, vomiting, confusion Headache, double vision, blurry vision, sensitivity to light, eye pain Heart muscle inflammation--unusual weakness or fatigue, shortness of breath, chest pain, fast or irregular heartbeat, dizziness, swelling of the ankles, feet, or hands High blood sugar (hyperglycemia)--increased thirst or amount of urine, unusual weakness or fatigue, blurry vision High thyroid levels (hyperthyroidism)--fast or irregular heartbeat, weight loss, excessive sweating or sensitivity to heat, tremors or shaking, anxiety, nervousness, irregular menstrual cycle or spotting Infusion reactions--chest pain, shortness of breath or trouble breathing, feeling faint or lightheaded Kidney injury (glomerulonephritis)--decrease in the amount of urine, red or dark brown urine, foamy or bubbly urine, swelling of the ankles, hands, or feet Liver injury--right upper belly pain, loss of appetite, nausea, light-colored stool, dark yellow or brown urine, yellowing skin or eyes, unusual weakness or fatigue Low adrenal gland function--nausea, vomiting, loss of appetite, unusual weakness or fatigue, dizziness Low red blood cell level--unusual weakness or fatigue, dizziness, headache, trouble breathing Low thyroid levels (hypothyroidism)--unusual weakness or fatigue, increased sensitivity to cold, constipation, hair loss, dry skin, weight gain, feelings of depression Pain, tingling, or numbness in the hands or feet, muscle weakness, trouble walking, loss of balance or coordination Rash, fever, and swollen lymph nodes Redness, blistering, peeling, or loosening of the skin, including inside the mouth Sudden or severe stomach pain, bloody diarrhea, fever, nausea, vomiting Unusual bruising or bleeding Side effects that usually do not require medical attention (report these to your care team if they continue or are bothersome): Bone  pain Diarrhea Muscle pain This list may not describe all possible side effects. Call your doctor for medical advice about side effects. You may report side effects to FDA at 1-800-FDA-1088. Where should I keep my medication? This medication is given in a hospital or clinic and will not be stored at home. NOTE: This sheet is a summary. It may not cover all possible information. If you have questions about this medicine, talk to your doctor, pharmacist, or health care provider.  2023 Elsevier/Gold Standard (2021-04-09 00:00:00) 

## 2021-10-29 NOTE — Telephone Encounter (Signed)
Contacted patients daughter to schedule follow up and infusion appts per 10/28/21 LOS. Unable to reach via phone, voicemail was left.   I did  not schedule the appt since they normally like late PM appts with Dr. Hinton Rao only but at this time I did not have the times they prefer.

## 2021-10-29 NOTE — Telephone Encounter (Signed)
Chris from Hematology called with critical WBC: 106.2. Dr. Hinton Rao aware

## 2021-10-31 ENCOUNTER — Encounter: Payer: Self-pay | Admitting: Oncology

## 2021-11-11 ENCOUNTER — Encounter: Payer: Self-pay | Admitting: Oncology

## 2021-11-17 ENCOUNTER — Inpatient Hospital Stay: Payer: Medicare Other

## 2021-11-17 ENCOUNTER — Ambulatory Visit (HOSPITAL_BASED_OUTPATIENT_CLINIC_OR_DEPARTMENT_OTHER): Payer: Medicare Other

## 2021-11-17 ENCOUNTER — Other Ambulatory Visit (HOSPITAL_COMMUNITY): Payer: Self-pay

## 2021-11-17 ENCOUNTER — Other Ambulatory Visit: Payer: Self-pay | Admitting: Hematology and Oncology

## 2021-11-17 ENCOUNTER — Inpatient Hospital Stay: Payer: Medicare Other | Attending: Oncology | Admitting: Hematology and Oncology

## 2021-11-17 ENCOUNTER — Encounter: Payer: Self-pay | Admitting: Oncology

## 2021-11-17 ENCOUNTER — Encounter: Payer: Self-pay | Admitting: Hematology and Oncology

## 2021-11-17 VITALS — BP 79/44 | HR 68 | Temp 97.9°F | Resp 18 | Ht 70.5 in | Wt 145.4 lb

## 2021-11-17 DIAGNOSIS — D508 Other iron deficiency anemias: Secondary | ICD-10-CM | POA: Diagnosis not present

## 2021-11-17 DIAGNOSIS — Z5112 Encounter for antineoplastic immunotherapy: Secondary | ICD-10-CM | POA: Diagnosis present

## 2021-11-17 DIAGNOSIS — C4491 Basal cell carcinoma of skin, unspecified: Secondary | ICD-10-CM | POA: Diagnosis present

## 2021-11-17 DIAGNOSIS — C9110 Chronic lymphocytic leukemia of B-cell type not having achieved remission: Secondary | ICD-10-CM

## 2021-11-17 DIAGNOSIS — Z452 Encounter for adjustment and management of vascular access device: Secondary | ICD-10-CM | POA: Insufficient documentation

## 2021-11-17 DIAGNOSIS — D509 Iron deficiency anemia, unspecified: Secondary | ICD-10-CM | POA: Diagnosis not present

## 2021-11-17 DIAGNOSIS — C4440 Unspecified malignant neoplasm of skin of scalp and neck: Secondary | ICD-10-CM

## 2021-11-17 DIAGNOSIS — Z79899 Other long term (current) drug therapy: Secondary | ICD-10-CM | POA: Diagnosis not present

## 2021-11-17 LAB — BASIC METABOLIC PANEL
BUN: 28 — AB (ref 4–21)
CO2: 26 — AB (ref 13–22)
Chloride: 106 (ref 99–108)
Creatinine: 1.1 (ref 0.6–1.3)
Glucose: 140
Potassium: 4.1 mEq/L (ref 3.5–5.1)
Sodium: 139 (ref 137–147)

## 2021-11-17 LAB — PROTEIN, TOTAL: Total Protein: 5.5 g/dL — AB (ref 6.3–8.2)

## 2021-11-17 LAB — CBC
MCV: 96 — AB (ref 80–94)
RBC: 2.68 — AB (ref 3.87–5.11)

## 2021-11-17 LAB — TSH: TSH: 2.48 u[IU]/mL (ref 0.350–4.500)

## 2021-11-17 LAB — CBC AND DIFFERENTIAL
HCT: 26 — AB (ref 41–53)
Hemoglobin: 7.5 — AB (ref 13.5–17.5)
Neutrophils Absolute: 8.09
Platelets: 188 10*3/uL (ref 150–400)
WBC: 115.6

## 2021-11-17 LAB — HEPATIC FUNCTION PANEL
ALT: 33 U/L (ref 10–40)
AST: 33 (ref 14–40)
Alkaline Phosphatase: 72 (ref 25–125)
Bilirubin, Total: 0.4

## 2021-11-17 LAB — PREPARE RBC (CROSSMATCH)

## 2021-11-17 LAB — CORRECTED CALCIUM (CC13): Calcium, Corrected: 9.2

## 2021-11-17 LAB — COMPREHENSIVE METABOLIC PANEL
Albumin: 3.2 — AB (ref 3.5–5.0)
Calcium: 8.4 — AB (ref 8.7–10.7)

## 2021-11-17 LAB — ABO/RH: ABO/RH(D): A POS

## 2021-11-17 MED ORDER — ACALABRUTINIB MALEATE 100 MG PO TABS
100.0000 mg | ORAL_TABLET | Freq: Two times a day (BID) | ORAL | 2 refills | Status: DC
  Filled 2021-11-17: qty 60, fill #0
  Filled 2021-11-18: qty 60, 30d supply, fill #0
  Filled 2021-12-09: qty 60, 30d supply, fill #1
  Filled 2022-01-04: qty 60, 30d supply, fill #2

## 2021-11-17 MED ORDER — SODIUM CHLORIDE 0.9% FLUSH
10.0000 mL | INTRAVENOUS | Status: DC | PRN
  Administered 2021-11-17: 10 mL

## 2021-11-17 MED ORDER — HEPARIN SOD (PORK) LOCK FLUSH 100 UNIT/ML IV SOLN
500.0000 [IU] | Freq: Once | INTRAVENOUS | Status: AC | PRN
  Administered 2021-11-17: 500 [IU]

## 2021-11-17 NOTE — Assessment & Plan Note (Addendum)
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  His skin cancer is now being treated with cemiplimab and is tolerating this well.  He has worsening anemia, now requiring transfusion.  This is likely due to his CLL, as he received IV iron again recently.  We recommend starting acalbrutinib 100 mg twice daily for his CLL. He met with our clinical pharmacist today for oral chemotherapy education. If he receives his medication right away, I will plan to see him back in 10 days to check his blood counts.

## 2021-11-17 NOTE — Progress Notes (Signed)
Patient was approved for the [facility] Member ID [id number] Group ID [id number] RxBin ID [id number] 08/19/2021 - 11/17/2022 $9,700

## 2021-11-17 NOTE — Progress Notes (Signed)
Port reaccessed at 1530 saline flush '@1530'$  followed by heparin

## 2021-11-17 NOTE — Progress Notes (Signed)
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  He continues to tolerate this without difficulty.  He will proceed with a 5th cycle tomorrow.  We will plan to see him back in 3 weeks prior to 6th cycle.  CLL (chronic lymphocytic leukemia) ([place]) We held off on treatment of his CLL due to multiple medical problems.  His skin cancer is now being treated with cemiplimab and is tolerating this well.  He has worsening anemia, now requiring transfusion.  This is likely due to his CLL, as he received IV iron again recently.  We recommend starting acalbrutinib 100 mg twice daily for his CLL. He met with our clinical pharmacist today for oral chemotherapy education. If he receives his medication right away, I will plan to see him back in 10 days to check his blood counts.  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  He intermittently receives IV iron replacement. He last received treatment in June.   The patient and his daughter understand the plans discussed today and are in agreement with them.  They knows to contact our office if he develops concerns prior to his next appointment.  The patient was seen and plan formulated with Dr. Hinton Young.  I provided 40 minutes of face-to-face time during this encounter and > 50% was spent counseling as documented under my assessment and plan.    Fernando Pickles, PA-C  [facility] Young AT [facility] Young [address] [place] [address] Dept: [phone] Dept Fax: [phone]   Orders Placed This Encounter  Procedures   CBC and differential    This external order was created through the Results Console.   CBC    This external order was created through the Results Console.   CBC    This order was created through External Result Entry   Basic metabolic panel    This external order was created through the Results Console.   Comprehensive metabolic panel    This external order was created through the Results Console.   Hepatic function panel    This external order was created through the Results Console.   Protein, total    This order was created through External Result Entry   Corrected Calcium    This order was created through External Result Entry      CHIEF COMPLAINT:  CC: Multiple skin cancers, as well as CLL  Current Treatment: Cemiplimab every 3 weeks for skin cancer  HISTORY OF PRESENT ILLNESS:   Oncology History  CLL (chronic lymphocytic leukemia) ([place])  05/25/2015 Initial Diagnosis   CLL (chronic lymphocytic leukemia) ([place])   02/04/2021 Cancer Staging   Staging form: Chronic Lymphocytic Leukemia / Small Lymphocytic Lymphoma, AJCC 8th Edition - Clinical stage from 02/04/2021: Modified Rai Stage III (Modified Rai risk: High, Binet: Stage A, Lymphocytosis: Present, Adenopathy: Absent, Organomegaly: Absent, Anemia: Present, Thrombocytopenia: Absent) - Signed by Fernando Kaplan, MD on 02/08/2021 Stage prefix: Recurrence Absolute  lymphocyte count (ALC) (cells/uL): 100 Hemoglobin (Hgb) (g/dL): 11.4 Platelet count (x10E9/L of blood): 184   Cancer of the skin, basal cell  05/06/2021 Initial Diagnosis   Cancer of the skin, basal cell   08/30/2021 -  Chemotherapy   Patient is on Treatment Plan : SQUAMOUS SKIN Cemiplimab q21d         INTERVAL HISTORY:  Fernando Young is here today for repeat clinical assessment prior to 5th cycle of  cemiplimab.  He states his skin cancers of his left face have cleared up, but he feels there is been a little progression of the lesions on the right side of his face. He denies fevers or chills. He denies pain. His appetite is good. His weight has decreased 3 pounds over last 3 weeks .  His blood pressure is low, but he continues benazepril and furosemide daily.  I asked him and his daughter to discuss with Dr. Heber Quon[place]aug.  REVIEW OF SYSTEMS:  Review of Systems  Constitutional:  Positive for fatigue. Negative for appetite change, chills, fever and unexpected weight change.  HENT:   Negative for lump/mass, mouth sores and sore throat.   Respiratory:  Negative for cough and shortness of breath.   Cardiovascular:  Negative for chest pain and leg swelling.  Gastrointestinal:  Positive for diarrhea. Negative for abdominal pain, constipation, nausea and vomiting.  Genitourinary:  Negative for difficulty urinating, dysuria, frequency and hematuria.   Musculoskeletal:  Positive for gait problem (Weak, in wheelchair). Negative for arthralgias, back pain and myalgias.  Skin:  Negative for itching and rash.  Neurological:  Positive for gait problem (Weak, in wheelchair). Negative for dizziness, extremity weakness, headaches, light-headedness and numbness.  Hematological:  Negative for adenopathy.  Psychiatric/Behavioral:  Negative for depression and sleep disturbance. The patient is not nervous/anxious.      VITALS:  Blood pressure (!) 79/44, pulse 68, temperature 97.9 F (36.6 C), temperature source Oral, resp. rate 18, height 5' 10.5" (1.791 m), weight 145 lb 6.4 oz (66 kg), SpO2 97 %.  Wt Readings from Last 3 Encounters:  11/17/21 145 lb 6.4 oz (66 kg)  10/29/21 148 lb (67.1 kg)  10/28/21 144 lb 1.6 oz (65.4 kg)    Body mass index is 20.57 kg/m.  Performance status (ECOG): 2 - Symptomatic, <50% confined to bed  PHYSICAL EXAM:  Physical Exam Vitals and nursing note reviewed.  Constitutional:       General: He is not in acute distress.    Appearance: Normal appearance. He is normal weight.  HENT:     Head: Normocephalic and atraumatic.     Mouth/Throat:     Mouth: Mucous membranes are moist.     Pharynx: Oropharynx is clear. No oropharyngeal exudate or posterior oropharyngeal erythema.  Eyes:     General: No scleral icterus.    Extraocular Movements: Extraocular movements intact.     Conjunctiva/sclera: Conjunctivae normal.     Pupils: Pupils are equal, round, and reactive to light.  Cardiovascular:     Rate and Rhythm: Normal rate and regular rhythm.     Heart sounds: Normal heart sounds. No murmur heard.    No friction rub. No gallop.  Pulmonary:     Effort: Pulmonary effort is normal.     Breath sounds: Normal breath sounds. No wheezing, rhonchi or rales.  Abdominal:     General: Bowel sounds are normal. There is no distension.     Palpations: Abdomen is soft. There is no mass.     Tenderness: There  is no abdominal tenderness.  Musculoskeletal:        General: Normal range of motion.     Cervical back: Normal range of motion and neck supple. No tenderness.     Right lower leg: No edema.     Left lower leg: No edema.  Lymphadenopathy:     Cervical: No cervical adenopathy.     Upper Body:     Right upper body: No supraclavicular or axillary adenopathy.     Left upper body: No supraclavicular or axillary adenopathy.     Lower Body: No right inguinal adenopathy. No left inguinal adenopathy.  Skin:    General: Skin is warm and dry.     Coloration: Skin is not jaundiced.     Findings: Lesion (Persistent superficial lesions on the right face with overlying eschar, which appears fairly stable.  Lesions of the nose also appears stable.  The left face lesions have basically resolved.) present. No rash.  Neurological:     Mental Status: He is alert and oriented to person, place, and time.     Cranial Nerves: No cranial nerve deficit.  Psychiatric:        Mood and Affect:  Mood normal.        Behavior: Behavior normal.        Thought Content: Thought content normal.     LABS:      Latest Ref Rng & Units 11/17/2021   12:00 AM 10/28/2021   12:00 AM 10/07/2021   12:00 AM  CBC  WBC  115.6     106.2     118.3      Hemoglobin 13.5 - 17.5 7.5     8.3     8.7      Hematocrit 41 - 53 _0 Platelets 150 - 400 K/uL 188     229     194         This result is from an external source.      Latest Ref Rng & Units 11/17/2021   12:00 AM 10/28/2021   12:00 AM 10/07/2021   12:00 AM  CMP  BUN 4 - 21 28     37     29      Creatinine 0.6 - 1.3 1.1     1.2     1.2      Sodium 137 - 147 139     138     141      Potassium 3.5 - 5.1 mEq/L 4.1     5.5     3.9      Chloride 99 - 108 106     105     105      CO2 13 - _1 Calcium 8.7 - 10.7 8.4     8.2     7.8      Total Protein 6.3 - 8.2 g/dL 5.5        Alkaline Phos 25 - 125 72     59     64      AST 14 - 40 33     43     28      ALT 10 - 40 U/L 33     23     23         This  result is from an external source.     No results found for: "CEA1", "CEA" / No results found for: "CEA1", "CEA" No results found for: "PSA1" No results found for: "OIN867" No results found for: "CAN125"  No results found for: "TOTALPROTELP", "ALBUMINELP", "A1GS", "A2GS", "BETS", "BETA2SER", "GAMS", "MSPIKE", "SPEI" Lab Results  Component Value Date   TIBC 291 09/15/2021   TIBC 341 06/04/2021   TIBC 304 11/04/2020   FERRITIN 48 09/15/2021   FERRITIN 53 06/04/2021   FERRITIN 40.6 11/04/2020   IRONPCTSAT 13 (L) 09/15/2021   IRONPCTSAT 11 (L) 06/04/2021   IRONPCTSAT 11 (A) 11/04/2020   Lab Results  Component Value Date   LDH 166 08/03/2020    STUDIES:  No results found.    HISTORY:   Past Medical History:  Diagnosis Date   B12 deficiency    Cancer of the skin, 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   CHF (congestive heart failure) (HCC)    Diabetes mellitus without complication ([place])    History of prostate  cancer    Hyperlipidemia    Hypertension    Hypoglycemia 05/06/2021   Iron deficiency anemia 02/20/2020   Leucocytosis    Leukemia, lymphocytic, chronic (H[facility])     Past Surgical History:  Procedure Laterality Date   PROSTATE BIOPSY      Family History  Problem Relation Age of Onset   Ovarian cancer Mother 82   Cancer Sister 39    Social History:  reports that he has quit smoking. He has never used smokeless tobacco. He reports that he does not currently use alcohol. He reports that he does not use drugs.The patient is accompanied by his daughter today.  Allergies: No Known Allergies  Current Medications: Current Outpatient Medications  Medication Sig Dispense Refill   acalabrutinib maleate 100 MG TABS Take 100 mg by mouth 2 (two) times daily. 60 tablet 2   amiodarone (PACERONE) 200 MG tablet Take 200 mg by mouth daily.     Ascorbic Acid (VITAMIN C) 500 MG CAPS Take 500 mg by mouth daily.     benazepril (LOTENSIN) 20 MG tablet Take 20 mg by mouth daily.     cholecalciferol (VITAMIN D3) 25 MCG (1000 UNIT) tablet Take 1,000 Units by mouth daily.     Dextromethorphan-guaiFENesin (CORICIDIN HBP CONGESTION/COUGH PO) Take 1 tablet by mouth every 12 (twelve) hours as needed (cold symptoms).     ELIQUIS 5 MG TABS tablet Take 5 mg by mouth 2 (two) times daily.     furosemide (LASIX) 20 MG tablet Take 20 mg by mouth 2 (two) times daily.     ibuprofen (ADVIL) 200 MG tablet Take 200 mg by mouth daily as needed for mild pain.     ketoconazole (NIZORAL) 2 % shampoo Apply topically 3 (three) times a week.     levothyroxine (SYNTHROID) 75 MCG tablet Take 75 mcg by mouth daily.     metFORMIN (GLUCOPHAGE-XR) 500 MG 24 hr tablet Take 500 mg by mouth 2 (two) times daily.     Omega-3 Fatty Acids (FISH OIL) 1000 MG CAPS Take 1,000 mg by mouth daily.     rOPINIRole (REQUIP) 0.5 MG tablet Take 0.5-1 mg by mouth See admin instructions. Taking one tablet at bedtime, if no relief take an additional tablet      rosuvastatin (CRESTOR) 20 MG tablet Take 10 mg by mouth daily.     vitamin B-12 (CYANOCOBALAMIN) 500 MCG tablet Take 500 mcg by mouth daily.     Current Facility-Administered Medications  Medication Dose Route Frequency Provider Last Rate Last Admin   sodium chloride flush (NS) 0.9 % injection 10 mL  10 mL Intracatheter PRN Milissa Fesperman A, PA-C   10 mL at 11/17/21 1425

## 2021-11-17 NOTE — Assessment & Plan Note (Addendum)
Iron deficiency anemia of uncertain etiology.  He intermittently receives IV iron replacement. He last received treatment in June.

## 2021-11-17 NOTE — Assessment & Plan Note (Signed)
Multiple skin cancers, especially covering his face, for which he had been on Erivedge therapy.  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  He continues to tolerate this without difficulty.  He will proceed with a 5th cycle tomorrow.  We will plan to see him back in 3 weeks prior to 6th cycle.

## 2021-11-18 ENCOUNTER — Inpatient Hospital Stay: Payer: Medicare Other

## 2021-11-18 ENCOUNTER — Other Ambulatory Visit: Payer: Self-pay | Admitting: Pharmacist

## 2021-11-18 ENCOUNTER — Telehealth: Payer: Self-pay | Admitting: Pharmacy Technician

## 2021-11-18 ENCOUNTER — Other Ambulatory Visit (HOSPITAL_COMMUNITY): Payer: Self-pay

## 2021-11-18 ENCOUNTER — Encounter: Payer: Self-pay | Admitting: Hematology and Oncology

## 2021-11-18 ENCOUNTER — Encounter: Payer: Self-pay | Admitting: Oncology

## 2021-11-18 DIAGNOSIS — D508 Other iron deficiency anemias: Secondary | ICD-10-CM

## 2021-11-18 DIAGNOSIS — Z5112 Encounter for antineoplastic immunotherapy: Secondary | ICD-10-CM | POA: Diagnosis not present

## 2021-11-18 LAB — T4: T4, Total: 10.1 ug/dL (ref 4.5–12.0)

## 2021-11-18 MED ORDER — HEPARIN SOD (PORK) LOCK FLUSH 100 UNIT/ML IV SOLN
500.0000 [IU] | Freq: Every day | INTRAVENOUS | Status: AC | PRN
  Administered 2021-11-18: 500 [IU]

## 2021-11-18 MED ORDER — SODIUM CHLORIDE 0.9% IV SOLUTION
250.0000 mL | Freq: Once | INTRAVENOUS | Status: AC
  Administered 2021-11-18: 250 mL via INTRAVENOUS

## 2021-11-18 MED ORDER — SODIUM CHLORIDE 0.9% FLUSH
10.0000 mL | INTRAVENOUS | Status: AC | PRN
  Administered 2021-11-18: 10 mL

## 2021-11-18 MED ORDER — ACETAMINOPHEN 325 MG PO TABS: 650.0000 mg | ORAL_TABLET | Freq: Once | ORAL | Status: DC

## 2021-11-18 MED ORDER — DIPHENHYDRAMINE HCL 25 MG PO CAPS: 25.0000 mg | ORAL_CAPSULE | Freq: Once | ORAL | Status: DC

## 2021-11-18 MED FILL — Cemiplimab-rwlc IV Soln 350 MG/7ML (50 MG/ML): INTRAVENOUS | Qty: 7 | Status: AC

## 2021-11-18 NOTE — Telephone Encounter (Signed)
Received New start notification for  Calquence . Will update as we work through the benefits process.

## 2021-11-18 NOTE — Telephone Encounter (Signed)
Spoke to patient and set up shipment for today, to deliver tomorrow 7/21

## 2021-11-18 NOTE — Patient Instructions (Signed)
Blood Transfusion, Adult A blood transfusion is a procedure in which you receive blood through an IV tube. You may need this procedure because of: A bleeding disorder. An illness. An injury. A surgery. The blood may come from someone else (a donor). You may also be able to donate blood for yourself. The blood given in a transfusion is made up of different types of cells. You may get: Red blood cells. These carry oxygen to the cells in the body. White blood cells. These help you fight infections. Platelets. These help your blood to clot. Plasma. This is the liquid part of your blood. It carries proteins and other substances through the body. If you have a clotting disorder, you may also get other types of blood products. Tell your doctor about: Any blood disorders you have. Any reactions you have had during a blood transfusion in the past. Any allergies you have. All medicines you are taking, including vitamins, herbs, eye drops, creams, and over-the-counter medicines. Any surgeries you have had. Any medical conditions you have. This includes any recent fever or cold symptoms. Whether you are pregnant or may be pregnant. What are the risks? Generally, this is a safe procedure. However, problems may occur. The most common problems include: A mild allergic reaction. This includes red, swollen areas of skin (hives) and itching. Fever or chills. This may be the body's response to new blood cells received. This may happen during or up to 4 hours after the transfusion. More serious problems may include: Too much fluid in the lungs. This may cause breathing problems. A serious allergic reaction. This includes breathing trouble or swelling around the face and lips. Lung injury. This causes breathing trouble and low oxygen in the blood. This can happen within hours of the transfusion or days later. Too much iron. This can happen after getting many blood transfusions over a period of time. An  infection or virus passed through the blood. This is rare. Donated blood is carefully tested before it is given. Your body's defense system (immune system) trying to attack the new blood cells. This is rare. Symptoms may include fever, chills, nausea, low blood pressure, and low back or chest pain. Donated cells attacking healthy tissues. This is rare. What happens before the procedure? Medicines Ask your doctor about: Changing or stopping your normal medicines. This is important. Taking aspirin and ibuprofen. Do not take these medicines unless your doctor tells you to take them. Taking over-the-counter medicines, vitamins, herbs, and supplements. General instructions Follow instructions from your doctor about what you cannot eat or drink. You will have a blood test to find out your blood type. The test also finds out what type of blood your body will accept and matches it to the donor type. If you are going to have a planned surgery, you may be able to donate your own blood. This may be done in case you need a transfusion. You will have your temperature, blood pressure, and pulse checked. You may receive medicine to help prevent an allergic reaction. This may be done if you have had a reaction to a transfusion before. This medicine may be given to you by mouth or through an IV tube. This procedure lasts about 1-4 hours. Plan for the time you need. What happens during the procedure?  An IV tube will be put into one of your veins. The bag of donated blood will be attached to your IV tube. Then, the blood will enter through your vein. Your temperature,   blood pressure, and pulse will be checked often. This is done to find early signs of a transfusion reaction. Tell your nurse right away if you have any of these symptoms: Shortness of breath or trouble breathing. Chest or back pain. Fever or chills. Red, swollen areas of skin or itching. If you have any signs or symptoms of a reaction, your  transfusion will be stopped. You may also be given medicine. When the transfusion is finished, your IV tube will be taken out. Pressure may be put on the IV site for a few minutes. A bandage (dressing) will be put on the IV site. The procedure may vary among doctors and hospitals. What happens after the procedure? You will be monitored until you leave the hospital or clinic. This includes checking your temperature, blood pressure, pulse, breathing rate, and blood oxygen level. Your blood may be tested to see how you are responding to the transfusion. You may be warmed with fluids or blankets. This is done to keep the temperature of your body normal. If you have your procedure in an outpatient setting, you will be told whom to contact to report any reactions. Where to find more information To learn more, visit the [employer]: redcross.org Summary A blood transfusion is a procedure in which you are given blood through an IV tube. The blood may come from someone else (a donor). You may also be able to donate blood for yourself. The blood you are given is made up of different blood cells. You may receive red blood cells, platelets, plasma, or white blood cells. Your temperature, blood pressure, and pulse will be checked often. After the procedure, your blood may be tested to see how you are responding. This information is not intended to replace advice given to you by your health care provider. Make sure you discuss any questions you have with your health care provider. Document Revised: 10/11/2018 Document Reviewed: 10/11/2018 Elsevier Patient Education  2023 Elsevier Inc.  

## 2021-11-18 NOTE — Telephone Encounter (Signed)
Patient has been approved for $8000 CLL copay grant through [facility].    Processing information:

## 2021-11-18 NOTE — Telephone Encounter (Signed)
Ran test claim- No PA is required.  Patient's copay is $2,229.77 for 30 day supply.  Patient was enrolling into copay assistance grant yesterday that will cover copay.  Pharmacy Team will coordinate shipment from [facility].

## 2021-11-18 NOTE — Progress Notes (Signed)
Enrolled patient into co-pay asssitance for Basal Cell Carcinoma through the [facility]. Member ID: [id number] Group ID: [id number] RxBin ID: [id number] 08/19/2021 - 11/17/2022 $6,700

## 2021-11-19 ENCOUNTER — Inpatient Hospital Stay: Payer: Medicare Other

## 2021-11-19 VITALS — BP 92/54 | HR 71 | Temp 98.4°F | Resp 18 | Ht 70.5 in | Wt 145.0 lb

## 2021-11-19 DIAGNOSIS — C4491 Basal cell carcinoma of skin, unspecified: Secondary | ICD-10-CM

## 2021-11-19 DIAGNOSIS — Z5112 Encounter for antineoplastic immunotherapy: Secondary | ICD-10-CM | POA: Diagnosis not present

## 2021-11-19 LAB — TYPE AND SCREEN
ABO/RH(D): A POS
Antibody Screen: NEGATIVE
Unit division: 0

## 2021-11-19 LAB — BPAM RBC
Blood Product Expiration Date: 202308032359
ISSUE DATE / TIME: 202307201141
Unit Type and Rh: 6200

## 2021-11-19 MED ORDER — SODIUM CHLORIDE 0.9 % IV SOLN: Freq: Once | INTRAVENOUS | Status: AC

## 2021-11-19 MED ORDER — HEPARIN SOD (PORK) LOCK FLUSH 100 UNIT/ML IV SOLN
500.0000 [IU] | Freq: Once | INTRAVENOUS | Status: AC | PRN
  Administered 2021-11-19: 500 [IU]

## 2021-11-19 MED ORDER — SODIUM CHLORIDE 0.9 % IV SOLN
350.0000 mg | Freq: Once | INTRAVENOUS | Status: AC
  Administered 2021-11-19: 350 mg via INTRAVENOUS
  Filled 2021-11-19: qty 7

## 2021-11-19 MED ORDER — SODIUM CHLORIDE 0.9% FLUSH
10.0000 mL | INTRAVENOUS | Status: DC | PRN
  Administered 2021-11-19: 10 mL

## 2021-11-19 NOTE — Patient Instructions (Signed)
Cemiplimab Injection What is this medication? CEMIPLIMAB (se MIP li mab) treats skin cancer and lung cancer. It works by blocking a protein that causes cancer cells to grow and multiply. This helps to slow or stop the spread of cancer cells. It is a monoclonal antibody. This medicine may be used for other purposes; ask your health care provider or pharmacist if you have questions. COMMON BRAND NAME(S): LIBTAYO What should I tell my care team before I take this medication? They need to know if you have any of these conditions: Allogeneic stem cell transplant (uses someone else's stem cells) Autoimmune diseases, such as Crohn's disease, ulcerative colitis, or lupus Diabetes Nervous system problems, such as myasthenia gravis or Guillain-Barre syndrome Organ transplant Recent or ongoing radiation Thyroid disease An unusual or allergic reaction to cemiplimab, other medications, foods, dyes, or preservatives Pregnant or trying to get pregnant Breast-feeding How should I use this medication? This medication is injected into a vein. It is given by your care team in a hospital or clinic setting. A special MedGuide will be given to you before each treatment. Be sure to read this information carefully each time. Talk to your care team about the use of this medication in children. Special care may be needed. Overdosage: If you think you have taken too much of this medicine contact a poison control center or emergency room at once. NOTE: This medicine is only for you. Do not share this medicine with others. What if I miss a dose? Keep appointments for follow-up doses. It is important not to miss your dose. Call your care team if you are unable to keep an appointment. What may interact with this medication? Interactions have not been studied. This list may not describe all possible interactions. Give your health care provider a list of all the medicines, herbs, non-prescription drugs, or dietary  supplements you use. Also tell them if you smoke, drink alcohol, or use illegal drugs. Some items may interact with your medicine. What should I watch for while using this medication? This medication may make you feel generally unwell. This is not uncommon as chemotherapy can affect healthy cells as well as cancer cells. Report any side effects. Continue your course of treatment even though you feel ill until your care team tells you to stop. You may need blood work done while you are taking this medication. This medication may cause serious skin reactions. They can happen in the weeks to months after starting the medication. Contact your care team right away if you notice fevers or flu-like symptoms with a rash. The rash may be red or purple and then turn into blisters or peeling of the skin. You may also notice a red rash with swelling of the face, lips, or lymph nodes in your neck or under your arms. Tell your care team right away if you have any changes in your vision. This medication may increase blood sugar. The risk may be higher in patients who already have diabetes. Ask your care team what you can do to lower your risk of diabetes while taking this medication. Talk to your care team if you wish to become pregnant or think you might be pregnant. This medication can cause serious birth defects if taken during pregnancy. A negative pregnancy test is required before starting this medication. A reliable form of contraception is recommended while taking this medication and for at least 4 months after stopping it. Do not breast-feed while taking this medication and for at least 4   months after stopping it. What side effects may I notice from receiving this medication? Side effects that you should report to your care team as soon as possible: Allergic reactions--skin rash, itching, hives, swelling of the face, lips, tongue, or throat Change in vision Dry cough, shortness of breath or trouble  breathing Fever, neck pain or stiffness, sensitivity to light, headache, nausea, vomiting, confusion Headache, double vision, blurry vision, sensitivity to light, eye pain Heart muscle inflammation--unusual weakness or fatigue, shortness of breath, chest pain, fast or irregular heartbeat, dizziness, swelling of the ankles, feet, or hands High blood sugar (hyperglycemia)--increased thirst or amount of urine, unusual weakness or fatigue, blurry vision High thyroid levels (hyperthyroidism)--fast or irregular heartbeat, weight loss, excessive sweating or sensitivity to heat, tremors or shaking, anxiety, nervousness, irregular menstrual cycle or spotting Infusion reactions--chest pain, shortness of breath or trouble breathing, feeling faint or lightheaded Kidney injury (glomerulonephritis)--decrease in the amount of urine, red or dark brown urine, foamy or bubbly urine, swelling of the ankles, hands, or feet Liver injury--right upper belly pain, loss of appetite, nausea, light-colored stool, dark yellow or brown urine, yellowing skin or eyes, unusual weakness or fatigue Low adrenal gland function--nausea, vomiting, loss of appetite, unusual weakness or fatigue, dizziness Low red blood cell level--unusual weakness or fatigue, dizziness, headache, trouble breathing Low thyroid levels (hypothyroidism)--unusual weakness or fatigue, increased sensitivity to cold, constipation, hair loss, dry skin, weight gain, feelings of depression Pain, tingling, or numbness in the hands or feet, muscle weakness, trouble walking, loss of balance or coordination Rash, fever, and swollen lymph nodes Redness, blistering, peeling, or loosening of the skin, including inside the mouth Sudden or severe stomach pain, bloody diarrhea, fever, nausea, vomiting Unusual bruising or bleeding Side effects that usually do not require medical attention (report these to your care team if they continue or are bothersome): Bone  pain Diarrhea Muscle pain This list may not describe all possible side effects. Call your doctor for medical advice about side effects. You may report side effects to FDA at 1-800-FDA-1088. Where should I keep my medication? This medication is given in a hospital or clinic and will not be stored at home. NOTE: This sheet is a summary. It may not cover all possible information. If you have questions about this medicine, talk to your doctor, pharmacist, or health care provider.  2023 Elsevier/Gold Standard (2021-04-09 00:00:00) 

## 2021-11-22 ENCOUNTER — Other Ambulatory Visit: Payer: Self-pay

## 2021-11-22 ENCOUNTER — Other Ambulatory Visit (HOSPITAL_COMMUNITY): Payer: Self-pay

## 2021-11-22 NOTE — Progress Notes (Signed)
Oral Oncology Pharmacist Encounter  I spoke with patient in the clinic for overview of: Calquence (acalabrutinib) for the treatment of CLL, planned duration until disease progression or unacceptable toxicity.  Labs from 11/17/21 assessed, no interventions needed. Prescription dose and frequency assessed.  Current medication list in Epic reviewed, DDIs with Calquence identified: none  Evaluated chart and no patient barriers to medication adherence noted.   Patient agreement for treatment documented in MD note on 11/17/2021.  Prescription has been e-scribed to the [facility] for benefits analysis and approval.  Counseled patient on administration, dosing, side effects, monitoring, drug-food interactions, safe handling, storage, and disposal.  Patient will take Calquence '100mg'$  capsules, 1 capsule by mouth approximately 12 hours apart, with or with out food, with a glass of water.  Patient knows to avoid acid suppressing agents, if needed, may seperate use of an H2RA blocker or antacid by 2 hours from Calquence admisinstration.  Calquence start date: 11/20/2021  Adverse effects include but are not limited to: headache, diarrhea, fatigue, rash, muscle pain, bruising, decreased blood counts, and altered cardiac conduction.    Reviewed with patient importance of keeping a medication schedule and plan for any missed doses. No barriers to medication adherence identified.  Medication reconciliation performed and medication/allergy list updated.  Insurance authorization for [employer] has been obtained. Test claim at the pharmacy revealed copayment $0 for 1st fill of 30 days with grants. This will ship from the [facility] on 11/18/21 to deliver to patient's home on 11/19/21.  Patient informed the pharmacy will reach out 5-7 days prior to needing next fill of Calquence to coordinate continued medication acquisition to prevent break in therapy.  All questions  answered.  Mr. Ridgley and his daughter voiced understanding and appreciation.   Medication education handout placed in mail for patient. Patient knows to call the office with questions or concerns. Oral Chemotherapy Clinic phone number provided to patient.   Drema Halon, PharmD Hematology/Oncology Clinical Pharmacist [facility] [phone]

## 2021-11-24 ENCOUNTER — Encounter: Payer: Self-pay | Admitting: Oncology

## 2021-11-24 ENCOUNTER — Other Ambulatory Visit (HOSPITAL_COMMUNITY): Payer: Self-pay

## 2021-11-25 ENCOUNTER — Encounter: Payer: Self-pay | Admitting: Oncology

## 2021-11-25 ENCOUNTER — Encounter: Payer: Self-pay | Admitting: Hematology and Oncology

## 2021-11-29 ENCOUNTER — Inpatient Hospital Stay: Payer: Medicare Other

## 2021-11-29 ENCOUNTER — Other Ambulatory Visit: Payer: Self-pay | Admitting: Hematology and Oncology

## 2021-11-29 ENCOUNTER — Inpatient Hospital Stay: Payer: Medicare Other | Admitting: Hematology and Oncology

## 2021-11-29 ENCOUNTER — Encounter: Payer: Self-pay | Admitting: Hematology and Oncology

## 2021-11-29 VITALS — BP 82/51 | HR 65 | Temp 98.6°F | Resp 20 | Ht 70.5 in | Wt 145.0 lb

## 2021-11-29 DIAGNOSIS — C4491 Basal cell carcinoma of skin, unspecified: Secondary | ICD-10-CM

## 2021-11-29 DIAGNOSIS — C9110 Chronic lymphocytic leukemia of B-cell type not having achieved remission: Secondary | ICD-10-CM

## 2021-11-29 DIAGNOSIS — C4440 Unspecified malignant neoplasm of skin of scalp and neck: Secondary | ICD-10-CM

## 2021-11-29 DIAGNOSIS — D509 Iron deficiency anemia, unspecified: Secondary | ICD-10-CM | POA: Diagnosis not present

## 2021-11-29 DIAGNOSIS — Z5112 Encounter for antineoplastic immunotherapy: Secondary | ICD-10-CM | POA: Diagnosis not present

## 2021-11-29 LAB — COMPREHENSIVE METABOLIC PANEL
Albumin: 3.2 — AB (ref 3.5–5.0)
Calcium: 8.2 — AB (ref 8.7–10.7)

## 2021-11-29 LAB — BASIC METABOLIC PANEL
BUN: 34 — AB (ref 4–21)
CO2: 25 — AB (ref 13–22)
Chloride: 107 (ref 99–108)
Creatinine: 1.1 (ref 0.6–1.3)
Glucose: 128
Potassium: 4.2 mEq/L (ref 3.5–5.1)
Sodium: 140 (ref 137–147)

## 2021-11-29 LAB — HEPATIC FUNCTION PANEL
ALT: 35 U/L (ref 10–40)
AST: 30 (ref 14–40)
Alkaline Phosphatase: 75 (ref 25–125)
Bilirubin, Total: 0.5

## 2021-11-29 LAB — CBC AND DIFFERENTIAL
HCT: 27 — AB (ref 41–53)
Hemoglobin: 8.1 — AB (ref 13.5–17.5)
Neutrophils Absolute: 11.75
Platelets: 169 10*3/uL (ref 150–400)
WBC: 195.8

## 2021-11-29 LAB — TSH: TSH: 1.278 u[IU]/mL (ref 0.350–4.500)

## 2021-11-29 LAB — CBC: RBC: 2.86 — AB (ref 3.87–5.11)

## 2021-11-29 MED ORDER — HEPARIN SOD (PORK) LOCK FLUSH 100 UNIT/ML IV SOLN
500.0000 [IU] | Freq: Once | INTRAVENOUS | Status: AC | PRN
  Administered 2021-11-29: 500 [IU]

## 2021-11-29 MED ORDER — SODIUM CHLORIDE 0.9% FLUSH
10.0000 mL | INTRAVENOUS | Status: DC | PRN
  Administered 2021-11-29: 10 mL

## 2021-11-29 NOTE — Progress Notes (Signed)
Patient Care Team: Raelene Bott, MD as PCP - General (Internal Medicine)  Clinic Day:  12/02/2021  Referring physician: Raelene Bott, MD  ASSESSMENT & PLAN:   Assessment & Plan: CLL (chronic lymphocytic leukemia) ([place]) We held off on treatment of his CLL due to multiple medical problems.  His skin cancer is now being treated with cemiplimab and is tolerating this well.  He has worsening anemia, now requiring transfusion.  This is likely due to his CLL, as he received IV iron again recently.  We recommend starting acalbrutinib 100 mg twice daily for his CLL. He has been on this for about a week now, tolerating well. However, his white count did increase today. I discussed with the patient and his daughter that we often see this happen and we expect for his white count to start to decrease. Hemoglobin today is 8.1 and we will plan for transfusion of one unit of PRBC this week as he remains quite active. He will keep his scheduled appointment next week with Dr. Hinton Rao for repeat evaluation.   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  He continues to tolerate this without difficulty.  He is due a 6th cycle next week.     The patient understands the plans discussed today and is in agreement with them.  He knows to contact our office if he develops concerns prior to his next appointment.    Melodye Ped, NP  [place] [address] [place] [address] Dept: [phone] Dept Fax: [phone]   Orders Placed This Encounter  Procedures   CBC and differential    This external order was created  through the Results Console.   CBC    This external order was created through the Results Console.   Basic metabolic panel    This external order was created through the Results Console.   Comprehensive metabolic panel    This external order was created through the Results Console.   Hepatic function panel    This external order was created through the Results Console.      CHIEF COMPLAINT:  CC: An 81 year old male with history of CLL and skin cancer of the face and scalp here for 2 week follow up  Current Treatment:  Cemiplimab/ Acalabrutinib  INTERVAL HISTORY:  Anis is here today for repeat clinical assessment. He denies fevers or chills. He denies pain. His appetite is good. His weight has been stable.  I have reviewed the past medical history, past surgical history, social history and family history with the patient and they are unchanged from previous note.  ALLERGIES:  has No Known Allergies.  MEDICATIONS:  Current Outpatient Medications  Medication Sig Dispense Refill   furosemide (LASIX) 40 MG tablet Take 1 tablet by mouth daily.     acalabrutinib maleate 100 MG TABS Take 100 mg by mouth 2 (two) times daily. 60 tablet 2   amiodarone (PACERONE) 200 MG tablet Take 200 mg by mouth daily.     Ascorbic Acid (VITAMIN C) 500 MG CAPS Take 500 mg by mouth daily.     benazepril (LOTENSIN) 20 MG tablet Take 20  mg by mouth daily.     cholecalciferol (VITAMIN D3) 25 MCG (1000 UNIT) tablet Take 1,000 Units by mouth daily.     Dextromethorphan-guaiFENesin (CORICIDIN HBP CONGESTION/COUGH PO) Take 1 tablet by mouth every 12 (twelve) hours as needed (cold symptoms).     ELIQUIS 5 MG TABS tablet Take 5 mg by mouth 2 (two) times daily.     ibuprofen (ADVIL) 200 MG tablet Take 200 mg by mouth daily as needed for mild pain.     ketoconazole (NIZORAL) 2 % shampoo Apply topically 3 (three) times a week.     levothyroxine (SYNTHROID) 75 MCG tablet Take 75 mcg by mouth daily.     metFORMIN  (GLUCOPHAGE-XR) 500 MG 24 hr tablet Take 500 mg by mouth 2 (two) times daily.     Omega-3 Fatty Acids (FISH OIL) 1000 MG CAPS Take 1,000 mg by mouth daily.     rOPINIRole (REQUIP) 0.5 MG tablet Take 0.5-1 mg by mouth See admin instructions. Taking one tablet at bedtime, if no relief take an additional tablet     rosuvastatin (CRESTOR) 20 MG tablet Take 10 mg by mouth daily.     vitamin B-12 (CYANOCOBALAMIN) 500 MCG tablet Take 500 mcg by mouth daily.     Current Facility-Administered Medications  Medication Dose Route Frequency Provider Last Rate Last Admin   sodium chloride flush (NS) 0.9 % injection 10 mL  10 mL Intracatheter PRN Mosher, Vida Roller A, PA-C   10 mL at 11/29/21 1410   Facility-Administered Medications Ordered in Other Visits  Medication Dose Route Frequency Provider Last Rate Last Admin   acetaminophen (TYLENOL) tablet 650 mg  650 mg Oral Once [place] Scrape A, NP       diphenhydrAMINE (BENADRYL) capsule 25 mg  25 mg Oral Once [place] Scrape A, NP       heparin lock flush 100 unit/mL  500 Units Intracatheter Daily PRN [place] Scrape A, NP       sodium chloride flush (NS) 0.9 % injection 10 mL  10 mL Intracatheter PRN [place] Scrape A, NP        HISTORY OF PRESENT ILLNESS:   Oncology History  CLL (chronic lymphocytic leukemia) ([place])  05/25/2015 Initial Diagnosis   CLL (chronic lymphocytic leukemia) ([place])   02/04/2021 Cancer Staging   Staging form: Chronic Lymphocytic Leukemia / Small Lymphocytic Lymphoma, AJCC 8th Edition - Clinical stage from 02/04/2021: Modified Rai Stage III (Modified Rai risk: High, Binet: Stage A, Lymphocytosis: Present, Adenopathy: Absent, Organomegaly: Absent, Anemia: Present, Thrombocytopenia: Absent) - Signed by Derwood Kaplan, MD on 02/08/2021 Stage prefix: Recurrence Absolute lymphocyte count (ALC) (cells/uL): 100 Hemoglobin (Hgb) (g/dL): 11.4 Platelet count (x10E9/L of blood): 184   Cancer of the skin, basal cell  05/06/2021 Initial  Diagnosis   Cancer of the skin, basal cell   08/30/2021 -  Chemotherapy   Patient is on Treatment Plan : SQUAMOUS SKIN Cemiplimab q21d         REVIEW OF SYSTEMS:   Constitutional: Denies fevers, chills or abnormal weight loss Eyes: Denies blurriness of vision Ears, nose, mouth, throat, and face: Denies mucositis or sore throat Respiratory: Denies cough, dyspnea or wheezes Cardiovascular: Denies palpitation, chest discomfort or lower extremity swelling Gastrointestinal:  Denies nausea, heartburn or change in bowel habits Skin: Denies abnormal skin rashes Lymphatics: Denies new lymphadenopathy or easy bruising Neurological:Denies numbness, tingling or new weaknesses Behavioral/Psych: Mood is stable, no new changes  All other systems were reviewed with the patient and are negative.  VITALS:  Blood pressure (!) 82/51, pulse 65, temperature 98.6 F (37 C), temperature source Oral, resp. rate 20, height 5' 10.5" (1.791 m), weight 145 lb (65.8 kg), SpO2 98 %.  Wt Readings from Last 3 Encounters:  12/02/21 145 lb (65.8 kg)  11/29/21 145 lb (65.8 kg)  11/19/21 145 lb (65.8 kg)    Body mass index is 20.51 kg/m.  Performance status (ECOG): 1 - Symptomatic but completely ambulatory  PHYSICAL EXAM:   GENERAL:alert, no distress and comfortable SKIN: skin color, texture, turgor are normal, no rashes or significant lesions EYES: normal, Conjunctiva are pink and non-injected, sclera clear OROPHARYNX:no exudate, no erythema and lips, buccal mucosa, and tongue normal  NECK: supple, thyroid normal size, non-tender, without nodularity LYMPH:  no palpable lymphadenopathy in the cervical, axillary or inguinal LUNGS: clear to auscultation and percussion with normal breathing effort HEART: regular rate & rhythm and no murmurs and no lower extremity edema ABDOMEN:abdomen soft, non-tender and normal bowel sounds Musculoskeletal:no cyanosis of digits and no clubbing  NEURO: alert & oriented x 3  with fluent speech, no focal motor/sensory deficits  LABORATORY DATA:  I have reviewed the data as listed    Component Value Date/Time   NA 140 11/29/2021 0000   K 4.2 11/29/2021 0000   CL 107 11/29/2021 0000   CO2 25 (A) 11/29/2021 0000   GLUCOSE 82 07/04/2021 0324   BUN 34 (A) 11/29/2021 0000   CREATININE 1.1 11/29/2021 0000   CREATININE 0.89 07/04/2021 0324   CALCIUM 8.2 (A) 11/29/2021 0000   PROT 5.5 (A) 11/17/2021 0000   ALBUMIN 3.2 (A) 11/29/2021 0000   AST 30 11/29/2021 0000   ALT 35 11/29/2021 0000   ALKPHOS 75 11/29/2021 0000   BILITOT 0.4 07/04/2021 0324   GFRNONAA >60 07/04/2021 0324    No results found for: "SPEP", "UPEP"  Lab Results  Component Value Date   WBC 195.8 11/29/2021   NEUTROABS 11.75 11/29/2021   HGB 8.1 (A) 11/29/2021   HCT 27 (A) 11/29/2021   MCV 96 (A) 11/17/2021   PLT 169 11/29/2021      Chemistry      Component Value Date/Time   NA 140 11/29/2021 0000   K 4.2 11/29/2021 0000   CL 107 11/29/2021 0000   CO2 25 (A) 11/29/2021 0000   BUN 34 (A) 11/29/2021 0000   CREATININE 1.1 11/29/2021 0000   CREATININE 0.89 07/04/2021 0324   GLU 128 11/29/2021 0000      Component Value Date/Time   CALCIUM 8.2 (A) 11/29/2021 0000   ALKPHOS 75 11/29/2021 0000   AST 30 11/29/2021 0000   ALT 35 11/29/2021 0000   BILITOT 0.4 07/04/2021 0324       RADIOGRAPHIC STUDIES: I have personally reviewed the radiological images as listed and agreed with the findings in the report. No results found.

## 2021-11-30 ENCOUNTER — Other Ambulatory Visit: Payer: Self-pay

## 2021-11-30 LAB — PREPARE RBC (CROSSMATCH)

## 2021-12-01 ENCOUNTER — Encounter: Payer: Self-pay | Admitting: Oncology

## 2021-12-02 ENCOUNTER — Encounter: Payer: Self-pay | Admitting: Hematology and Oncology

## 2021-12-02 ENCOUNTER — Other Ambulatory Visit (HOSPITAL_COMMUNITY): Payer: Self-pay

## 2021-12-02 ENCOUNTER — Encounter: Payer: Self-pay | Admitting: Oncology

## 2021-12-02 ENCOUNTER — Inpatient Hospital Stay: Payer: Medicare Other | Attending: Oncology

## 2021-12-02 DIAGNOSIS — L03313 Cellulitis of chest wall: Secondary | ICD-10-CM | POA: Diagnosis not present

## 2021-12-02 DIAGNOSIS — C4491 Basal cell carcinoma of skin, unspecified: Secondary | ICD-10-CM | POA: Insufficient documentation

## 2021-12-02 DIAGNOSIS — Z5112 Encounter for antineoplastic immunotherapy: Secondary | ICD-10-CM | POA: Diagnosis present

## 2021-12-02 DIAGNOSIS — Z79899 Other long term (current) drug therapy: Secondary | ICD-10-CM | POA: Insufficient documentation

## 2021-12-02 DIAGNOSIS — C9110 Chronic lymphocytic leukemia of B-cell type not having achieved remission: Secondary | ICD-10-CM | POA: Diagnosis present

## 2021-12-02 DIAGNOSIS — Z452 Encounter for adjustment and management of vascular access device: Secondary | ICD-10-CM | POA: Insufficient documentation

## 2021-12-02 MED ORDER — SODIUM CHLORIDE 0.9% FLUSH
10.0000 mL | INTRAVENOUS | Status: AC | PRN
  Administered 2021-12-02: 10 mL

## 2021-12-02 MED ORDER — ACETAMINOPHEN 325 MG PO TABS: 650.0000 mg | ORAL_TABLET | Freq: Once | ORAL | Status: DC

## 2021-12-02 MED ORDER — DIPHENHYDRAMINE HCL 25 MG PO CAPS: 25.0000 mg | ORAL_CAPSULE | Freq: Once | ORAL | Status: DC

## 2021-12-02 MED ORDER — SODIUM CHLORIDE 0.9% IV SOLUTION
250.0000 mL | Freq: Once | INTRAVENOUS | Status: AC
  Administered 2021-12-02: 250 mL via INTRAVENOUS

## 2021-12-02 MED ORDER — HEPARIN SOD (PORK) LOCK FLUSH 100 UNIT/ML IV SOLN
500.0000 [IU] | Freq: Every day | INTRAVENOUS | Status: AC | PRN
  Administered 2021-12-02: 500 [IU]

## 2021-12-02 NOTE — Patient Instructions (Signed)

## 2021-12-02 NOTE — Assessment & Plan Note (Signed)
Multiple skin cancers, especially covering his face, for which he had been on Erivedge therapy.  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  He continues to tolerate this without difficulty.  He is due a 6th cycle next week.

## 2021-12-02 NOTE — Assessment & Plan Note (Signed)
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  His skin cancer is now being treated with cemiplimab and is tolerating this well.  He has worsening anemia, now requiring transfusion.  This is likely due to his CLL, as he received IV iron again recently.  We recommend starting acalbrutinib 100 mg twice daily for his CLL. He has been on this for about a week now, tolerating well. However, his white count did increase today. I discussed with the patient and his daughter that we often see this happen and we expect for his white count to start to decrease. Hemoglobin today is 8.1 and we will plan for transfusion of one unit of PRBC this week as he remains quite active. He will keep his scheduled appointment next week with Dr. Hinton Rao for repeat evaluation.

## 2021-12-03 ENCOUNTER — Other Ambulatory Visit: Payer: Self-pay | Admitting: Oncology

## 2021-12-03 LAB — TYPE AND SCREEN
ABO/RH(D): A POS
Antibody Screen: NEGATIVE
Unit division: 0

## 2021-12-03 LAB — BPAM RBC
Blood Product Expiration Date: 202308232359
ISSUE DATE / TIME: 202308030758
Unit Type and Rh: 6200

## 2021-12-08 ENCOUNTER — Encounter: Payer: Self-pay | Admitting: Hematology and Oncology

## 2021-12-08 ENCOUNTER — Inpatient Hospital Stay: Payer: Medicare Other | Admitting: Hematology and Oncology

## 2021-12-08 ENCOUNTER — Inpatient Hospital Stay: Payer: Medicare Other

## 2021-12-08 VITALS — BP 81/50 | HR 70 | Temp 98.7°F | Resp 18 | Ht 70.5 in | Wt 145.1 lb

## 2021-12-08 DIAGNOSIS — C9110 Chronic lymphocytic leukemia of B-cell type not having achieved remission: Secondary | ICD-10-CM

## 2021-12-08 DIAGNOSIS — C4440 Unspecified malignant neoplasm of skin of scalp and neck: Secondary | ICD-10-CM

## 2021-12-08 DIAGNOSIS — L03313 Cellulitis of chest wall: Secondary | ICD-10-CM

## 2021-12-08 DIAGNOSIS — D509 Iron deficiency anemia, unspecified: Secondary | ICD-10-CM | POA: Diagnosis not present

## 2021-12-08 DIAGNOSIS — Z5112 Encounter for antineoplastic immunotherapy: Secondary | ICD-10-CM | POA: Diagnosis not present

## 2021-12-08 DIAGNOSIS — C4491 Basal cell carcinoma of skin, unspecified: Secondary | ICD-10-CM

## 2021-12-08 LAB — CBC
Absolute Lymphocytes: 209.34 — AB (ref 0.65–4.75)
MCV: 94 (ref 80–94)
RBC: 3.05 — AB (ref 3.87–5.11)

## 2021-12-08 LAB — HEPATIC FUNCTION PANEL
ALT: 31 U/L (ref 10–40)
AST: 29 (ref 14–40)
Alkaline Phosphatase: 71 (ref 25–125)
Bilirubin, Total: 0.5

## 2021-12-08 LAB — CBC AND DIFFERENTIAL
HCT: 29 — AB (ref 41–53)
Hemoglobin: 8.6 — AB (ref 13.5–17.5)
Neutrophils Absolute: 13.36
Platelets: 160 10*3/uL (ref 150–400)
WBC: 222.7

## 2021-12-08 LAB — BASIC METABOLIC PANEL
BUN: 30 — AB (ref 4–21)
CO2: 28 — AB (ref 13–22)
Chloride: 106 (ref 99–108)
Creatinine: 1 (ref 0.6–1.3)
Glucose: 108
Potassium: 4.3 mEq/L (ref 3.5–5.1)
Sodium: 139 (ref 137–147)

## 2021-12-08 LAB — COMPREHENSIVE METABOLIC PANEL
Albumin: 3.4 — AB (ref 3.5–5.0)
Calcium: 8.5 — AB (ref 8.7–10.7)

## 2021-12-08 LAB — CORRECTED CALCIUM (CC13): Calcium, Corrected: 9.1

## 2021-12-08 LAB — PROTEIN, TOTAL: Total Protein: 5.8 g/dL — AB (ref 6.3–8.2)

## 2021-12-08 LAB — TSH: TSH: 2.074 u[IU]/mL (ref 0.350–4.500)

## 2021-12-08 MED ORDER — DEXTROSE 5 % IV SOLN: 2.0000 g | INTRAVENOUS | Status: DC

## 2021-12-08 MED ORDER — HEPARIN SOD (PORK) LOCK FLUSH 100 UNIT/ML IV SOLN
500.0000 [IU] | Freq: Once | INTRAVENOUS | Status: AC | PRN
  Administered 2021-12-08: 500 [IU]

## 2021-12-08 MED ORDER — CEPHALEXIN 500 MG PO CAPS: 500.0000 mg | ORAL_CAPSULE | Freq: Three times a day (TID) | ORAL | 0 refills | Status: DC

## 2021-12-08 MED ORDER — SODIUM CHLORIDE 0.9% FLUSH
10.0000 mL | INTRAVENOUS | Status: DC | PRN
  Administered 2021-12-08: 10 mL

## 2021-12-08 NOTE — Assessment & Plan Note (Signed)
Cellulitis overlying his port site. I will give him ceftriaxone 2 g IM daily for 3 days and then transition to oral cephalexin. He and his daughter know to contact us if this does not improve with treatment.

## 2021-12-08 NOTE — Progress Notes (Signed)
CRITICAL VALUE STICKER  CRITICAL VALUE: WBC 231.2, Hgb 8.8, Plt 167  RECEIVER (on-site recipient of call): Lindell Spar RN  [place] NOTIFIED:  12/08/2021 @ 1616  MESSENGER (representative from lab): Roselyn Reef MD NOTIFIED: Rosanne Sack, PA   TIME OF NOTIFICATION:  1616  RESPONSE:

## 2021-12-08 NOTE — Assessment & Plan Note (Signed)
Multiple skin cancers, especially covering his face, for which he had been on Erivedge therapy.  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  He continues to tolerate this without difficulty.  He will proceed with a 6th cycle this week.  We will plan to see him back in 3 weeks prior to 7th cycle.

## 2021-12-08 NOTE — Assessment & Plan Note (Addendum)
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  His skin cancer is now being treated with cemiplimab and he is tolerating this well.  He has worsening anemia, now requiring transfusion.  This is likely due to his CLL, as he received IV iron recently. He started acalabrutinib 100 mg twice daily for his CLL on July 21st. He had an increase in his white blood cell count to 195,000 when we saw him mid cycle on July 31st.  An intial increase in the lymphocytosis is common with acalabrutinib.  His white blood cell count is even higher today at 222,700, but he does have infection.  We will have him continue acalabrutinib.

## 2021-12-08 NOTE — Progress Notes (Signed)
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  He continues to tolerate this without difficulty.  He will proceed with a 6th cycle this week.  We will plan to see him back in 3 weeks prior to 7th cycle.  CLL (chronic lymphocytic leukemia) ([place]) We held off on treatment of his CLL due to multiple medical problems.  His skin cancer is now being treated with cemiplimab and he is tolerating this well.  He has worsening anemia, now requiring transfusion.  This is likely due to his CLL, as he received IV iron recently. He started acalabrutinib 100 mg twice daily for his CLL on July 21st. He had an increase in his white blood cell count to 195,000 when we saw him mid cycle on July 31st.  An intial increase in the lymphocytosis is common with acalabrutinib.  His white blood cell count is even higher today at 222,700, but he does have infection.  We will have him continue acalabrutinib.  Cellulitis of chest wall Cellulitis overlying his port site. I will give him ceftriaxone 2 g IM daily for 3 days and then transition to oral cephalexin. He and his daughter know to contact us if this does not improve with treatment.   The patient understands the plans discussed today and is in agreement with them.  He knows to contact our office if he develops concerns prior to his next appointment.   I provided 30 minutes of face-to-face time during this encounter and > 50% was spent counseling as  documented under my assessment and plan.    Marvia Pickles, PA-C  [facility] AT [facility] [address] [place] [address] Dept: [phone] Dept Fax: [phone]   Orders Placed This Encounter  Procedures   Basic metabolic panel    This external order was created through the Results Console.   Comprehensive metabolic panel    This external order was created through the Results Console.   Hepatic function panel    This external order was created through the Results Console.   Protein, total    This order was created through External Result Entry   Corrected Calcium    This order was created through External Result Entry   CBC and differential    This external order was created through the Results Console.   CBC    This external order was created through the Results Console.   CBC    This order was created through External Result Entry      CHIEF COMPLAINT:  CC: Multiple skin cancers, as well as chronic lymphocytic leukemia  Current Treatment: Cemiplimab every 3 weeks and acalabrutinib 100 mg twice daily.  HISTORY OF PRESENT ILLNESS:   Oncology History  CLL (chronic lymphocytic leukemia) ([place])  05/25/2015 Initial Diagnosis   CLL (chronic lymphocytic leukemia) ([place])   02/04/2021 Cancer Staging   Staging form: Chronic Lymphocytic Leukemia / Small Lymphocytic Lymphoma, AJCC 8th Edition - Clinical  stage from 02/04/2021: Modified Rai Stage III (Modified Rai risk: High, Binet: Stage A, Lymphocytosis: Present, Adenopathy: Absent, Organomegaly: Absent, Anemia: Present, Thrombocytopenia: Absent) - Signed by Derwood Kaplan, MD on 02/08/2021 Stage prefix: Recurrence Absolute lymphocyte count (ALC) (cells/uL): 100 Hemoglobin (Hgb) (g/dL): 11.4 Platelet count (x10E9/L of blood): 184   Cancer of the skin, basal cell  05/06/2021 Initial Diagnosis   Cancer of the skin, basal cell   08/30/2021 -  Chemotherapy   Patient is  on Treatment Plan : [place] q21d         INTERVAL HISTORY:  Sherill is here today for repeat clinical assessment prior to a 6th cycle of cemiplimab.  He reports fatigue.  His gait is unsteady. He otherwise denies other specific complaints. He denies fevers or chills. He denies pain. His appetite is good. His weight has been stable.  REVIEW OF SYSTEMS:  Review of Systems  Constitutional:  Positive for fatigue. Negative for appetite change, chills, fever and unexpected weight change.  HENT:   Negative for lump/mass, mouth sores and sore throat.   Respiratory:  Negative for cough and shortness of breath.   Cardiovascular:  Negative for chest pain and leg swelling.  Gastrointestinal:  Negative for abdominal pain, constipation, diarrhea, nausea and vomiting.  Genitourinary:  Negative for difficulty urinating, dysuria, frequency and hematuria.   Musculoskeletal:  Positive for gait problem. Negative for arthralgias, back pain and myalgias.  Skin:  Negative for itching, rash and wound.  Neurological:  Positive for gait problem. Negative for dizziness, extremity weakness, headaches, light-headedness and numbness.  Hematological:  Negative for adenopathy.  Psychiatric/Behavioral:  Negative for depression and sleep disturbance. The patient is not nervous/anxious.      VITALS:  Blood pressure (!) 81/50, pulse 70, temperature 98.7 F (37.1 C), temperature source Oral, resp. rate 18, height 5' 10.5" (1.791 m), weight 145 lb 1.6 oz (65.8 kg), SpO2 96 %.  Wt Readings from Last 3 Encounters:  12/08/21 145 lb 1.6 oz (65.8 kg)  12/02/21 145 lb (65.8 kg)  11/29/21 145 lb (65.8 kg)    Body mass index is 20.53 kg/m.  Performance status (ECOG): 1 - Symptomatic but completely ambulatory  PHYSICAL EXAM:  Physical Exam Vitals and nursing note reviewed.  Constitutional:      General: He is not in acute distress.    Appearance: Normal appearance. He is normal weight.  HENT:     Head:  Normocephalic and atraumatic.     Mouth/Throat:     Mouth: Mucous membranes are moist.     Pharynx: Oropharynx is clear. No oropharyngeal exudate or posterior oropharyngeal erythema.  Eyes:     General: No scleral icterus.    Extraocular Movements: Extraocular movements intact.     Conjunctiva/sclera: Conjunctivae normal.     Pupils: Pupils are equal, round, and reactive to light.  Cardiovascular:     Rate and Rhythm: Normal rate and regular rhythm.     Heart sounds: Normal heart sounds. No murmur heard.    No friction rub. No gallop.  Pulmonary:     Effort: Pulmonary effort is normal.     Breath sounds: Normal breath sounds. No wheezing, rhonchi or rales.  Abdominal:     General: Bowel sounds are normal. There is no distension.     Palpations: Abdomen is soft. There is no hepatomegaly, splenomegaly or mass.     Tenderness: There is no abdominal tenderness.  Musculoskeletal:        General: Normal range of  motion.     Cervical back: Normal range of motion and neck supple. No tenderness.     Right lower leg: No edema.     Left lower leg: No edema.  Lymphadenopathy:     Cervical: No cervical adenopathy.     Upper Body:     Right upper body: No supraclavicular or axillary adenopathy.     Left upper body: No supraclavicular or axillary adenopathy.  Skin:    General: Skin is warm and dry.     Coloration: Skin is not jaundiced.     Findings: Erythema and lesion present. No rash.     Comments:  Decreased superficial lesions on the right face .  Lesions of the nose also appears stable.  The left face lesions have flared up with overlying eschar.  He has erythema overlying the port and extending inferiorly with 2 overlying pustules.  Neurological:     Mental Status: He is alert and oriented to person, place, and time.     Cranial Nerves: No cranial nerve deficit.  Psychiatric:        Mood and Affect: Mood normal.        Behavior: Behavior normal.        Thought Content: Thought  content normal.     LABS:      Latest Ref Rng & Units 12/08/2021   12:00 AM 11/29/2021   12:00 AM 11/17/2021   12:00 AM  CBC  WBC  222.7     195.8     115.6      Hemoglobin 13.5 - 17.5 8.6     8.1     7.5      Hematocrit 41 - 53 '29     27     26      '$ Platelets 150 - 400 K/uL 160     169     188         This result is from an external source.      Latest Ref Rng & Units 12/08/2021   12:00 AM 11/29/2021   12:00 AM 11/17/2021   12:00 AM  CMP  BUN 4 - 21 30     34     28      Creatinine 0.6 - 1.3 1.0     1.1     1.1      Sodium 137 - 147 139     140     139      Potassium 3.5 - 5.1 mEq/L 4.3     4.2     4.1      Chloride 99 - 108 106     107     106      CO2 13 - '22 28     25     26      '$ Calcium 8.7 - 10.7 8.5     8.2     8.4      Total Protein 6.3 - 8.2 g/dL 5.8      5.5      Alkaline Phos 25 - 125 71     75     72      AST 14 - 40 29     30     33      ALT 10 - 40 U/L 31     35     33         This result is from an external source.  No results found for: "CEA1", "CEA" / No results found for: "CEA1", "CEA" No results found for: "PSA1" No results found for: "OIZ124" No results found for: "CAN125"  No results found for: "TOTALPROTELP", "ALBUMINELP", "A1GS", "A2GS", "BETS", "BETA2SER", "GAMS", "MSPIKE", "SPEI" Lab Results  Component Value Date   TIBC 291 09/15/2021   TIBC 341 06/04/2021   TIBC 304 11/04/2020   FERRITIN 48 09/15/2021   FERRITIN 53 06/04/2021   FERRITIN 40.6 11/04/2020   IRONPCTSAT 13 (L) 09/15/2021   IRONPCTSAT 11 (L) 06/04/2021   IRONPCTSAT 11 (A) 11/04/2020   Lab Results  Component Value Date   LDH 166 08/03/2020    STUDIES:  No results found.    HISTORY:   Past Medical History:  Diagnosis Date   B12 deficiency    Cancer of the skin, 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   CHF (congestive heart failure) (HCC)    Diabetes mellitus without complication ([place])    History of prostate cancer    Hyperlipidemia    Hypertension    Hypoglycemia 05/06/2021    Iron deficiency anemia 02/20/2020   Leucocytosis    Leukemia, lymphocytic, chronic (HCC)     Past Surgical History:  Procedure Laterality Date   PROSTATE BIOPSY      Family History  Problem Relation Age of Onset   Ovarian cancer Mother 28   Cancer Sister 19    Social History:  reports that he has quit smoking. He has never used smokeless tobacco. He reports that he does not currently use alcohol. He reports that he does not use drugs.The patient is accompanied by his daughter today.  Allergies: No Known Allergies  Current Medications: Current Outpatient Medications  Medication Sig Dispense Refill   cephALEXin (KEFLEX) 500 MG capsule Take 1 capsule (500 mg total) by mouth 3 (three) times daily. 21 capsule 0   levothyroxine (SYNTHROID) 75 MCG tablet Take 1 tablet by mouth daily.     acalabrutinib maleate 100 MG TABS Take 100 mg by mouth 2 (two) times daily. 60 tablet 2   amiodarone (PACERONE) 200 MG tablet Take 200 mg by mouth daily.     Ascorbic Acid (VITAMIN C) 500 MG CAPS Take 500 mg by mouth daily.     benazepril (LOTENSIN) 20 MG tablet Take 20 mg by mouth daily. Taking half a tab do to low BP per PCP, not wanting to stop meds with heart hx     cholecalciferol (VITAMIN D3) 25 MCG (1000 UNIT) tablet Take 1,000 Units by mouth daily.     Dextromethorphan-guaiFENesin (CORICIDIN HBP CONGESTION/COUGH PO) Take 1 tablet by mouth every 12 (twelve) hours as needed (cold symptoms).     ELIQUIS 5 MG TABS tablet Take 5 mg by mouth 2 (two) times daily.     furosemide (LASIX) 40 MG tablet Take 1 tablet by mouth daily.     ibuprofen (ADVIL) 200 MG tablet Take 200 mg by mouth daily as needed for mild pain.     ketoconazole (NIZORAL) 2 % shampoo Apply topically 3 (three) times a week.     metFORMIN (GLUCOPHAGE-XR) 500 MG 24 hr tablet Take 500 mg by mouth 2 (two) times daily.     Omega-3 Fatty Acids (FISH OIL) 1000 MG CAPS Take 1,000 mg by mouth daily.     rOPINIRole (REQUIP) 0.5 MG tablet Take  0.5-1 mg by mouth See admin instructions. Taking one tablet at bedtime, if no relief take an additional tablet     rosuvastatin (CRESTOR) 20 MG tablet Take 10 mg  by mouth daily.     vitamin B-12 (CYANOCOBALAMIN) 500 MCG tablet Take 500 mcg by mouth daily.     Current Facility-Administered Medications  Medication Dose Route Frequency Provider Last Rate Last Admin   cefTRIAXone (ROCEPHIN) 2 g in dextrose 5 % 50 mL IVPB  2 g Intravenous Q24H Kattie Santoyo A, PA-C       sodium chloride flush (NS) 0.9 % injection 10 mL  10 mL Intracatheter PRN Demiah Gullickson A, PA-C   10 mL at 12/08/21 1552

## 2021-12-09 ENCOUNTER — Inpatient Hospital Stay: Payer: Medicare Other

## 2021-12-09 ENCOUNTER — Other Ambulatory Visit (HOSPITAL_COMMUNITY): Payer: Self-pay

## 2021-12-09 ENCOUNTER — Encounter: Payer: Self-pay | Admitting: Oncology

## 2021-12-09 ENCOUNTER — Encounter: Payer: Self-pay | Admitting: Hematology and Oncology

## 2021-12-09 VITALS — BP 90/51 | HR 67 | Temp 97.4°F | Resp 18

## 2021-12-09 DIAGNOSIS — D509 Iron deficiency anemia, unspecified: Secondary | ICD-10-CM

## 2021-12-09 DIAGNOSIS — Z5112 Encounter for antineoplastic immunotherapy: Secondary | ICD-10-CM | POA: Diagnosis not present

## 2021-12-09 LAB — T4: T4, Total: 9.4 ug/dL (ref 4.5–12.0)

## 2021-12-09 MED ORDER — LIDOCAINE HCL (PF) 1 % IJ SOLN
5.0000 mL | Freq: Once | INTRAMUSCULAR | Status: AC
  Administered 2021-12-09: 5 mL
  Filled 2021-12-09: qty 5

## 2021-12-09 MED ORDER — CEFTRIAXONE SODIUM 1 G IJ SOLR
2.0000 g | INTRAMUSCULAR | Status: DC
  Administered 2021-12-09: 2 g via INTRAMUSCULAR
  Filled 2021-12-09: qty 20

## 2021-12-09 MED ORDER — CEFTRIAXONE SODIUM 1 G IJ SOLR
2.0000 g | INTRAMUSCULAR | Status: DC
  Administered 2021-12-08: 2 g via INTRAMUSCULAR

## 2021-12-09 MED ORDER — LIDOCAINE HCL (PF) 1 % IJ SOLN
5.0000 mL | Freq: Once | INTRAMUSCULAR | Status: AC
  Administered 2021-12-08: 5 mL

## 2021-12-09 MED FILL — Cemiplimab-rwlc IV Soln 350 MG/7ML (50 MG/ML): INTRAVENOUS | Qty: 7 | Status: AC

## 2021-12-09 NOTE — Addendum Note (Signed)
Addended by: Juanetta Beets on: 12/09/2021 09:25 AM   Modules accepted: Orders

## 2021-12-10 ENCOUNTER — Inpatient Hospital Stay: Payer: Medicare Other

## 2021-12-10 ENCOUNTER — Telehealth: Payer: Self-pay

## 2021-12-10 VITALS — BP 95/51 | HR 72 | Temp 98.1°F | Resp 18 | Ht 70.5 in | Wt 145.0 lb

## 2021-12-10 DIAGNOSIS — Z5112 Encounter for antineoplastic immunotherapy: Secondary | ICD-10-CM | POA: Diagnosis not present

## 2021-12-10 DIAGNOSIS — C4491 Basal cell carcinoma of skin, unspecified: Secondary | ICD-10-CM

## 2021-12-10 DIAGNOSIS — D509 Iron deficiency anemia, unspecified: Secondary | ICD-10-CM

## 2021-12-10 MED ORDER — CEFTRIAXONE SODIUM 1 G IJ SOLR: 2.0000 g | INTRAMUSCULAR | Status: DC

## 2021-12-10 MED ORDER — SODIUM CHLORIDE 0.9 % IV SOLN
350.0000 mg | Freq: Once | INTRAVENOUS | Status: AC
  Administered 2021-12-10: 350 mg via INTRAVENOUS
  Filled 2021-12-10: qty 7

## 2021-12-10 MED ORDER — HEPARIN SOD (PORK) LOCK FLUSH 100 UNIT/ML IV SOLN
500.0000 [IU] | Freq: Once | INTRAVENOUS | Status: AC | PRN
  Administered 2021-12-10: 500 [IU]

## 2021-12-10 MED ORDER — SODIUM CHLORIDE 0.9 % IV SOLN: Freq: Once | INTRAVENOUS | Status: AC

## 2021-12-10 MED ORDER — SODIUM CHLORIDE 0.9% FLUSH
10.0000 mL | INTRAVENOUS | Status: DC | PRN
  Administered 2021-12-10: 10 mL

## 2021-12-10 MED ORDER — LIDOCAINE HCL (PF) 1 % IJ SOLN: 5.0000 mL | Freq: Once | INTRAMUSCULAR | Status: DC

## 2021-12-10 NOTE — Patient Instructions (Signed)
Cemiplimab Injection What is this medication? CEMIPLIMAB (se MIP li mab) treats skin cancer and lung cancer. It works by helping your immune system slow or stop the spread of cancer cells. It is a monoclonal antibody. This medicine may be used for other purposes; ask your health care provider or pharmacist if you have questions. COMMON BRAND NAME(S): LIBTAYO What should I tell my care team before I take this medication? They need to know if you have any of these conditions: Allogeneic stem cell transplant (uses someone else's stem cells) Autoimmune diseases, such as Crohn's disease, ulcerative colitis, or lupus Diabetes Nervous system problems, such as myasthenia gravis or Guillain-Barre syndrome Organ transplant Recent or ongoing radiation Thyroid disease An unusual or allergic reaction to cemiplimab, other medications, foods, dyes, or preservatives Pregnant or trying to get pregnant Breast-feeding How should I use this medication? This medication is infused into a vein. It is given by your care team in a hospital or clinic setting. A special MedGuide will be given to you before each treatment. Be sure to read this information carefully each time. Talk to your care team about the use of this medication in children. Special care may be needed. Overdosage: If you think you have taken too much of this medicine contact a poison control center or emergency room at once. NOTE: This medicine is only for you. Do not share this medicine with others. What if I miss a dose? Keep appointments for follow-up doses. It is important not to miss your dose. Call your care team if you are unable to keep an appointment. What may interact with this medication? Interactions have not been studied. This list may not describe all possible interactions. Give your health care provider a list of all the medicines, herbs, non-prescription drugs, or dietary supplements you use. Also tell them if you smoke, drink  alcohol, or use illegal drugs. Some items may interact with your medicine. What should I watch for while using this medication? This medication may make you feel generally unwell. This is not uncommon as chemotherapy can affect healthy cells as well as cancer cells. Report any side effects. Continue your course of treatment even though you feel ill until your care team tells you to stop. You may need blood work done while you are taking this medication. This medication may cause serious skin reactions. They can happen in the weeks to months after starting the medication. Contact your care team right away if you notice fevers or flu-like symptoms with a rash. The rash may be red or purple and then turn into blisters or peeling of the skin. You may also notice a red rash with swelling of the face, lips, or lymph nodes in your neck or under your arms. Tell your care team right away if you have any changes in your vision. This medication may increase blood sugar. The risk may be higher in patients who already have diabetes. Ask your care team what you can do to lower your risk of diabetes while taking this medication. Talk to your care team if you wish to become pregnant or think you might be pregnant. This medication can cause serious birth defects if taken during pregnancy. A negative pregnancy test is required before starting this medication. A reliable form of contraception is recommended while taking this medication and for at least 4 months after stopping it. Do not breast-feed while taking this medication and for at least 4 months after stopping it. What side effects may I notice  from receiving this medication? Side effects that you should report to your care team as soon as possible: Allergic reactions--skin rash, itching, hives, swelling of the face, lips, tongue, or throat Dry cough, shortness of breath or trouble breathing Eye pain, redness, irritation, or discharge with blurry or decreased  vision Heart muscle inflammation--unusual weakness or fatigue, shortness of breath, chest pain, fast or irregular heartbeat, dizziness, swelling of the ankles, feet, or hands Hormone gland problems--headache, sensitivity to light, unusual weakness or fatigue, dizziness, fast or irregular heartbeat, increased sensitivity to cold or heat, excessive sweating, constipation, hair loss, increased thirst or amount of urine, tremors or shaking, irritability Infusion reactions--chest pain, shortness of breath or trouble breathing, feeling faint or lightheaded Kidney injury (glomerulonephritis)--decrease in the amount of urine, red or dark brown urine, foamy or bubbly urine, swelling of the ankles, hands, or feet Liver injury--right upper belly pain, loss of appetite, nausea, light-colored stool, dark yellow or brown urine, yellowing skin or eyes, unusual weakness or fatigue Pain, tingling, or numbness in the hands or feet, muscle weakness, change in vision, confusion or trouble speaking, loss of balance or coordination, trouble walking, seizures Rash, fever, and swollen lymph nodes Redness, blistering, peeling, or loosening of the skin, including inside the mouth Sudden or severe stomach pain, bloody diarrhea, fever, nausea, vomiting Side effects that usually do not require medical attention (report these to your care team if they continue or are bothersome): Bone, joint, or muscle pain Diarrhea Fatigue Loss of appetite Nausea Skin rash This list may not describe all possible side effects. Call your doctor for medical advice about side effects. You may report side effects to FDA at 1-800-FDA-1088. Where should I keep my medication? This medication is given in a hospital or clinic and will not be stored at home. NOTE: This sheet is a summary. It may not cover all possible information. If you have questions about this medicine, talk to your doctor, pharmacist, or health care provider.  2023 Elsevier/Gold  Standard (2021-03-24 00:00:00)

## 2021-12-10 NOTE — Telephone Encounter (Signed)
I spoke with pt to see how he is doing. He states, "I'm so tired,I can hardly go". He denies N/V, diarrhea, any new skin rash, headaches, fever, and muscle pain. He takes the medication twice day, sometimes with a meal, sometimes without. His son lives w/him and his daughter came with him to last visit. Pt denies missed doses. I told him to call us if he develops temp of 100.4 or higher. He replied, "ok".  Pt is receiving IV Libtayo as scheduled today and IV antibiotics for an infection @ port.

## 2021-12-11 ENCOUNTER — Other Ambulatory Visit: Payer: Self-pay

## 2021-12-13 ENCOUNTER — Other Ambulatory Visit (HOSPITAL_COMMUNITY): Payer: Self-pay

## 2021-12-15 ENCOUNTER — Encounter: Payer: Self-pay | Admitting: Hematology and Oncology

## 2021-12-27 ENCOUNTER — Telehealth: Payer: Self-pay

## 2021-12-27 NOTE — Telephone Encounter (Signed)
I spoke with Mr Mcbryar. He states, "I'm doing ok". He is taking the [place] @ 10am, and again between 10p-11p. He denies headache, N/V, fever, skin rash and headaches. He admits to a little diarrhea @ times. Denies missed doses. I reminded pt to call us if he develops temp of 100.4 or higher, day or night. He verbalized understanding. I confirmed his next appt 12/30/2021 @ 2pm

## 2021-12-28 ENCOUNTER — Other Ambulatory Visit: Payer: Self-pay

## 2021-12-30 ENCOUNTER — Inpatient Hospital Stay (INDEPENDENT_AMBULATORY_CARE_PROVIDER_SITE_OTHER): Payer: Medicare Other | Admitting: Hematology and Oncology

## 2021-12-30 ENCOUNTER — Encounter: Payer: Self-pay | Admitting: Hematology and Oncology

## 2021-12-30 ENCOUNTER — Inpatient Hospital Stay: Payer: Medicare Other

## 2021-12-30 VITALS — BP 89/54 | HR 68 | Temp 98.1°F | Resp 18 | Ht 70.5 in | Wt 143.5 lb

## 2021-12-30 DIAGNOSIS — C9110 Chronic lymphocytic leukemia of B-cell type not having achieved remission: Secondary | ICD-10-CM

## 2021-12-30 DIAGNOSIS — C4440 Unspecified malignant neoplasm of skin of scalp and neck: Secondary | ICD-10-CM | POA: Diagnosis not present

## 2021-12-30 DIAGNOSIS — D509 Iron deficiency anemia, unspecified: Secondary | ICD-10-CM

## 2021-12-30 DIAGNOSIS — Z5112 Encounter for antineoplastic immunotherapy: Secondary | ICD-10-CM | POA: Diagnosis not present

## 2021-12-30 DIAGNOSIS — C4491 Basal cell carcinoma of skin, unspecified: Secondary | ICD-10-CM

## 2021-12-30 LAB — BASIC METABOLIC PANEL
BUN: 31 — AB (ref 4–21)
CO2: 26 — AB (ref 13–22)
Chloride: 106 (ref 99–108)
Creatinine: 1.1 (ref 0.6–1.3)
Glucose: 105
Potassium: 4.1 mEq/L (ref 3.5–5.1)
Sodium: 139 (ref 137–147)

## 2021-12-30 LAB — HEPATIC FUNCTION PANEL
ALT: 23 U/L (ref 10–40)
AST: 21 (ref 14–40)
Alkaline Phosphatase: 55 (ref 25–125)
Bilirubin, Total: 0.5

## 2021-12-30 LAB — COMPREHENSIVE METABOLIC PANEL
Albumin: 3.4 — AB (ref 3.5–5.0)
Calcium: 8.5 — AB (ref 8.7–10.7)

## 2021-12-30 LAB — CBC
Absolute Lymphocytes: 202.16 — AB (ref 0.65–4.75)
MCV: 95 — AB (ref 80–94)
RBC: 2.7 — AB (ref 3.87–5.11)

## 2021-12-30 LAB — CBC AND DIFFERENTIAL
HCT: 26 — AB (ref 41–53)
Hemoglobin: 8 — AB (ref 13.5–17.5)
Neutrophils Absolute: 10.64
Platelets: 157 10*3/uL (ref 150–400)
WBC: 212.8

## 2021-12-30 LAB — SAMPLE TO BLOOD BANK

## 2021-12-30 LAB — TSH: TSH: 1.677 u[IU]/mL (ref 0.350–4.500)

## 2021-12-30 LAB — PREPARE RBC (CROSSMATCH)

## 2021-12-30 LAB — CORRECTED CALCIUM (CC13): Calcium, Corrected: 9.1

## 2021-12-30 MED ORDER — SODIUM CHLORIDE 0.9% FLUSH
10.0000 mL | INTRAVENOUS | Status: DC | PRN
  Administered 2021-12-30: 10 mL

## 2021-12-30 MED ORDER — HEPARIN SOD (PORK) LOCK FLUSH 100 UNIT/ML IV SOLN
500.0000 [IU] | Freq: Once | INTRAVENOUS | Status: AC | PRN
  Administered 2021-12-30: 500 [IU]

## 2021-12-30 MED FILL — Cemiplimab-rwlc IV Soln 350 MG/7ML (50 MG/ML): INTRAVENOUS | Qty: 7 | Status: AC

## 2021-12-30 NOTE — Assessment & Plan Note (Signed)
Multiple skin cancers, especially covering his face, for which he had been on Erivedge therapy.  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  He continues to tolerate this without difficulty.  He will proceed with a 6th cycle this week.  He recently saw his dermatologist who recommended continuing with cemiplimab.  He will proceed with that tomorrow.  We will plan to see him back in 3 weeks prior to 7th cycle.

## 2021-12-30 NOTE — Assessment & Plan Note (Addendum)
Iron deficiency anemia of uncertain etiology.  He has intermittently received IV iron replacement. He last received treatment in June.  He has required transfusion recently.  He now reports dark stools without bright red blood.  He takes Pepto-Bismol fairly regularly for upset stomach.  I will have him collect stool Hemoccults x3.  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  His hemoglobin is 8, so I will arrange for him to have 1 unit of packed red blood cells as an outpatient tomorrow.

## 2021-12-30 NOTE — Assessment & Plan Note (Addendum)
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  His skin cancer is now being treated with cemiplimab and he is tolerating this well.  He has worsening anemia, now requiring transfusion.  This is likely due to his CLL, as he received IV iron recently. He started acalabrutinib 100 mg twice daily for his CLL on July 21st. He has had an increase in his white blood cell count since starting acalabrutinib.  An intial increase in the lymphocytosis is common with acalabrutinib.  His white blood cell count has decreased somewhat from 222,000 to 212,000.  He has persistent anemia without definite nutritional deficiency, which is likely due to his CLL.  He will receive 1 unit packed red blood cells tomorrow.  We will have him continue acalabrutinib.

## 2021-12-30 NOTE — Progress Notes (Addendum)
Fernando Young  [address] [place],  [place]  [id number] [phone]  Clinic Day:  12/30/2021  Referring physician: Raelene Bott, MD  ASSESSMENT & PLAN:   Assessment & Plan: CLL (chronic lymphocytic leukemia) ([place]) We held off on treatment of his CLL due to multiple medical problems.  His skin cancer is now being treated with cemiplimab and he is tolerating this well.  He has worsening anemia, now requiring transfusion.  This is likely due to his CLL, as he received IV iron recently. He started acalabrutinib 100 mg twice daily for his CLL on July 21st. He has had an increase in his white blood cell count since starting acalabrutinib.  An intial increase in the lymphocytosis is common with acalabrutinib.  His white blood cell count has decreased somewhat from 222,000 to 212,000.  He has persistent anemia without definite nutritional deficiency, which is likely due to his CLL.  He will receive 1 unit packed red blood cells tomorrow.  We will have him continue acalabrutinib.  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  He has intermittently received IV iron replacement. He last received treatment in June.  He has required transfusion recently.  He now reports dark stools without bright red blood.  He takes Pepto-Bismol fairly regularly for upset stomach.  I will have him collect stool Hemoccults x3.  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  His hemoglobin is 8, so I will arrange for him to have 1 unit of packed red blood cells as an outpatient tomorrow.  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  He continues to tolerate this without difficulty.  He will proceed with a 6th cycle this week.  He recently saw his dermatologist who recommended continuing with cemiplimab.  He will proceed with that tomorrow.  We will plan to see him back in 3 weeks prior to 7th cycle.   The patient understands the plans discussed today and is in agreement with them.  He knows to contact our office if he develops concerns prior to his next appointment.   I provided 20 minutes of face-to-face time during this encounter and > 50% was spent counseling as documented under my assessment and plan.    Marvia Pickles, PA-C  [facility] AT [facility] [address] [place] [address] Dept: [phone] Dept Fax: [phone]   Orders Placed This Encounter  Procedures   CBC and differential    This external order was created through the Results Console.   CBC    This external order was created through the Results Console.   Basic metabolic panel    This external order was created through the Results Console.   Comprehensive metabolic panel    This external order was created through the Results Console.   Hepatic function panel    This external order was created through the Results Console.   Ferritin    Standing Status:   Future    Number of Occurrences:   1    Standing Expiration Date:   12/31/2022  Iron and TIBC    Standing Status:   Future    Number of Occurrences:   1    Standing Expiration Date:   12/31/2022   CBC    This order was created through External Result Entry   Corrected Calcium    This order was created through External Result Entry   Vitamin B12    Standing Status:   Future    Number of Occurrences:   1    Standing Expiration Date:   12/31/2022   Soluble transferrin receptor    Standing Status:   Future    Standing Expiration Date:   01/01/2023      CHIEF COMPLAINT:  CC:  Multiple skin cancers of the face and neck, as well is chronic lymphocytic leukemia  Current Treatment: Cemiplimab every 3 weeks, as well as acalabrutinib daily  HISTORY OF PRESENT ILLNESS:   Oncology History  CLL (chronic lymphocytic leukemia) ([place])  05/25/2015 Initial Diagnosis   CLL (chronic lymphocytic leukemia) ([place])   02/04/2021 Cancer Staging   Staging form: Chronic Lymphocytic Leukemia / Small Lymphocytic Lymphoma, AJCC 8th Edition - Clinical stage from 02/04/2021: Modified Rai Stage III (Modified Rai risk: High, Binet: Stage A, Lymphocytosis: Present, Adenopathy: Absent, Organomegaly: Absent, Anemia: Present, Thrombocytopenia: Absent) - Signed by Derwood Kaplan, MD on 02/08/2021 Stage prefix: Recurrence Absolute lymphocyte count (ALC) (cells/uL): 100 Hemoglobin (Hgb) (g/dL): 11.4 Platelet count (x10E9/L of blood): 184   Cancer of the skin, basal cell  05/06/2021 Initial Diagnosis   Cancer of the skin, basal cell   08/30/2021 -  Chemotherapy   Patient is on Treatment Plan : [place] q21d         INTERVAL HISTORY:  Fernando Young is here today for repeat clinical assessment and reports fatigue and occasional abdominal pain, as well as intermittent diarrhea, for which he is using Pepto-Bismol on a daily basis.  He reports black stool for the past several months.  He otherwise denies overt form of blood loss.  He denies shortness of breath or chest pain.  He has been chronically hypotensive, but denies dizziness.  His other provider has recommended continuing his benazepril.  He denies fevers or chills. He denies pain. His appetite is good. His weight has decreased 2 pounds over last 3 weeks .  He continues [place] much [place], but it is more difficult.  REVIEW OF SYSTEMS:  Review of Systems  Constitutional:  Negative for appetite change, chills, fatigue, fever and unexpected weight change.  HENT:   Negative for lump/mass, mouth sores  and sore throat.   Respiratory:  Negative for cough and shortness of breath.   Cardiovascular:  Negative for chest pain and leg swelling.  Gastrointestinal:  Negative for abdominal pain, constipation, diarrhea, nausea and vomiting.  Genitourinary:  Negative for difficulty urinating, dysuria, frequency and hematuria.   Musculoskeletal:  Negative for arthralgias, back pain and myalgias.  Skin:  Negative for itching, rash and wound.  Neurological:  Negative for dizziness, extremity weakness, headaches, light-headedness and numbness.  Hematological:  Negative for adenopathy.  Psychiatric/Behavioral:  Negative for depression and sleep disturbance. The patient is not nervous/anxious.      VITALS:  Blood pressure (!) 89/54, pulse 68, temperature 98.1 F (36.7 C), temperature source Oral, resp. rate 18, height 5' 10.5" (1.791 m), weight 143 lb 8 oz (65.1 kg), SpO2 99 %.  Wt Readings from Last 3 Encounters:  12/30/21 143 lb 8 oz (65.1 kg)  12/10/21 145 lb (65.8 kg)  12/08/21 145 lb 1.6 oz (65.8 kg)    Body mass index is 20.3 kg/m.  Performance status (ECOG): 2 - Symptomatic, <50% confined to bed  PHYSICAL EXAM:  Physical Exam Vitals and nursing note reviewed.  Constitutional:      General: He is not in acute distress.    Appearance: Normal appearance. He is normal weight.  HENT:     Head: Normocephalic and atraumatic.     Mouth/Throat:     Mouth: Mucous membranes are moist.     Pharynx: Oropharynx is clear. No oropharyngeal exudate or posterior oropharyngeal erythema.  Eyes:     General: No scleral icterus.    Extraocular Movements: Extraocular movements intact.     Conjunctiva/sclera: Conjunctivae normal.     Pupils: Pupils are equal, round, and reactive to light.  Cardiovascular:     Rate and Rhythm: Normal rate and regular rhythm.     Heart sounds: Normal heart sounds. No murmur heard.    No friction rub. No gallop.  Pulmonary:     Effort: Pulmonary effort is normal.      Breath sounds: Normal breath sounds. No wheezing, rhonchi or rales.  Abdominal:     General: Bowel sounds are normal. There is no distension.     Palpations: Abdomen is soft. There is no hepatomegaly, splenomegaly or mass.     Tenderness: There is no abdominal tenderness.  Musculoskeletal:        General: Normal range of motion.     Cervical back: Normal range of motion and neck supple. No tenderness.     Right lower leg: Edema (1+) present.     Left lower leg: Edema (1+) present.  Lymphadenopathy:     Cervical: No cervical adenopathy.     Upper Body:     Right upper body: No supraclavicular or axillary adenopathy.     Left upper body: No supraclavicular or axillary adenopathy.  Skin:    General: Skin is warm and dry.     Coloration: Skin is not jaundiced.     Findings: Lesion present. No rash.     Comments: Decreased superficial lesions on the right face .  Lesions of the nose and left face appear stable  Neurological:     Mental Status: He is alert and oriented to person, place, and time.     Cranial Nerves: No cranial nerve deficit.  Psychiatric:        Mood and Affect: Mood normal.        Behavior: Behavior normal.        Thought Content: Thought content normal.     LABS:      Latest Ref Rng & Units 12/30/2021   12:00 AM 12/08/2021   12:00 AM 11/29/2021   12:00 AM  CBC  WBC  212.8     222.7     195.8      Hemoglobin 13.5 - 17.5 8.0     8.6     8.1      Hematocrit 41 - 53 '26     29     27      '$ Platelets 150 - 400 K/uL 157     160     169         This result is from an external source.      Latest Ref Rng & Units 12/30/2021   12:00 AM 12/08/2021   12:00 AM 11/29/2021   12:00 AM  CMP  BUN 4 - '21 31     30     '$ 34      Creatinine 0.6 - 1.3 1.1     1.0     1.1      Sodium 137 - 147 139     139     140      Potassium 3.5 - 5.1 mEq/L 4.1     4.3     4.2      Chloride 99 - 108 106     106     107      CO2 13 - '22 26     28     25      '$ Calcium 8.7 - 10.7 8.5     8.5     8.2       Total Protein 6.3 - 8.2 g/dL  5.8       Alkaline Phos 25 - 125 55     71     75      AST 14 - 40 '21     29     30      '$ ALT 10 - 40 U/L 23     31     35         This result is from an external source.     No results found for: "CEA1", "CEA" / No results found for: "CEA1", "CEA" No results found for: "PSA1" No results found for: "LGX211" No results found for: "CAN125"  No results found for: "TOTALPROTELP", "ALBUMINELP", "A1GS", "A2GS", "BETS", "BETA2SER", "GAMS", "MSPIKE", "SPEI" Lab Results  Component Value Date   TIBC 267 12/31/2021   TIBC 291 09/15/2021   TIBC 341 06/04/2021   FERRITIN 248 12/31/2021   FERRITIN 48 09/15/2021   FERRITIN 53 06/04/2021   IRONPCTSAT 22 12/31/2021   IRONPCTSAT 13 (L) 09/15/2021   IRONPCTSAT 11 (L) 06/04/2021   Lab Results  Component Value Date   LDH 166 08/03/2020    STUDIES:  No results found.    HISTORY:   Past Medical History:  Diagnosis Date   B12 deficiency    Cancer of the skin, 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   CHF (congestive heart failure) ([facility])    Diabetes mellitus without complication ([place])    History of prostate cancer    Hyperlipidemia    Hypertension    Hypoglycemia 05/06/2021   Iron deficiency anemia 02/20/2020   Leucocytosis    Leukemia, lymphocytic, chronic (H[facility])     Past Surgical History:  Procedure Laterality Date   PROSTATE BIOPSY      Family History  Problem Relation Age of Onset   Ovarian cancer Mother 28   Cancer Sister 21    Social History:  reports that he has quit smoking. He has never used smokeless tobacco. He reports that he does not currently use alcohol. He reports that he does not use drugs.The patient is accompanied by his daughter today.  Allergies: No Known Allergies  Current Medications: Current Outpatient Medications  Medication Sig Dispense Refill   acalabrutinib maleate 100 MG TABS Take 100 mg by mouth 2 (two) times daily. 60 tablet 2   amiodarone (PACERONE) 200 MG tablet Take 200 mg by  mouth daily.     Ascorbic Acid (VITAMIN C) 500 MG CAPS Take 500 mg by mouth daily.     benazepril (LOTENSIN) 20 MG tablet Take 20 mg by mouth daily. Taking half a tab do to low BP per PCP, not wanting to  stop meds with heart hx     cholecalciferol (VITAMIN D3) 25 MCG (1000 UNIT) tablet Take 1,000 Units by mouth daily.     Dextromethorphan-guaiFENesin (CORICIDIN HBP CONGESTION/COUGH PO) Take 1 tablet by mouth every 12 (twelve) hours as needed (cold symptoms).     ELIQUIS 5 MG TABS tablet Take 5 mg by mouth 2 (two) times daily.     furosemide (LASIX) 40 MG tablet Take 1 tablet by mouth daily.     ibuprofen (ADVIL) 200 MG tablet Take 200 mg by mouth daily as needed for mild pain.     ketoconazole (NIZORAL) 2 % shampoo Apply topically 3 (three) times a week.     levothyroxine (SYNTHROID) 75 MCG tablet Take 1 tablet by mouth daily.     metFORMIN (GLUCOPHAGE-XR) 500 MG 24 hr tablet Take 500 mg by mouth 2 (two) times daily.     neomycin-polymyxin b-dexamethasone (MAXITROL) 3.5-10000-0.1 OINT Administer 1 application to the right eye two (2) times a day.     Omega-3 Fatty Acids (FISH OIL) 1000 MG CAPS Take 1,000 mg by mouth daily.     rOPINIRole (REQUIP) 0.5 MG tablet Take 0.5-1 mg by mouth See admin instructions. Taking one tablet at bedtime, if no relief take an additional tablet     rosuvastatin (CRESTOR) 20 MG tablet Take 10 mg by mouth daily.     vitamin B-12 (CYANOCOBALAMIN) 500 MCG tablet Take 500 mcg by mouth daily.     Current Facility-Administered Medications  Medication Dose Route Frequency Provider Last Rate Last Admin   sodium chloride flush (NS) 0.9 % injection 10 mL  10 mL Intracatheter PRN Tiaunna Buford A, PA-C   10 mL at 12/30/21 1435   Facility-Administered Medications Ordered in Other Visits  Medication Dose Route Frequency Provider Last Rate Last Admin   0.9 %  sodium chloride infusion   Intravenous Once Derwood Kaplan, MD       sodium chloride flush (NS) 0.9 % injection 10  mL  10 mL Intracatheter PRN Edi Gorniak, Thalia Bloodgood, PA-C

## 2021-12-31 ENCOUNTER — Inpatient Hospital Stay: Payer: Medicare Other | Attending: Oncology

## 2021-12-31 ENCOUNTER — Inpatient Hospital Stay: Payer: Medicare Other

## 2021-12-31 ENCOUNTER — Encounter: Payer: Self-pay | Admitting: Hematology and Oncology

## 2021-12-31 ENCOUNTER — Encounter: Payer: Self-pay | Admitting: Oncology

## 2021-12-31 ENCOUNTER — Other Ambulatory Visit: Payer: Self-pay

## 2021-12-31 VITALS — BP 99/62 | HR 88 | Temp 98.2°F | Resp 18

## 2021-12-31 DIAGNOSIS — Z452 Encounter for adjustment and management of vascular access device: Secondary | ICD-10-CM | POA: Insufficient documentation

## 2021-12-31 DIAGNOSIS — D509 Iron deficiency anemia, unspecified: Secondary | ICD-10-CM

## 2021-12-31 DIAGNOSIS — C9110 Chronic lymphocytic leukemia of B-cell type not having achieved remission: Secondary | ICD-10-CM | POA: Insufficient documentation

## 2021-12-31 DIAGNOSIS — Z5112 Encounter for antineoplastic immunotherapy: Secondary | ICD-10-CM | POA: Diagnosis present

## 2021-12-31 DIAGNOSIS — C4491 Basal cell carcinoma of skin, unspecified: Secondary | ICD-10-CM | POA: Insufficient documentation

## 2021-12-31 DIAGNOSIS — Z79899 Other long term (current) drug therapy: Secondary | ICD-10-CM | POA: Insufficient documentation

## 2021-12-31 LAB — IRON AND TIBC
Iron: 58 ug/dL (ref 45–182)
Saturation Ratios: 22 % (ref 17.9–39.5)
TIBC: 267 ug/dL (ref 250–450)
UIBC: 209 ug/dL

## 2021-12-31 LAB — VITAMIN B12: Vitamin B-12: 493 pg/mL (ref 180–914)

## 2021-12-31 LAB — FOLATE: Folate: 15.8 ng/mL (ref >5.9)

## 2021-12-31 LAB — FERRITIN: Ferritin: 248 ng/mL (ref 24–336)

## 2021-12-31 MED ORDER — SODIUM CHLORIDE 0.9% FLUSH: 10.0000 mL | INTRAVENOUS | Status: DC | PRN

## 2021-12-31 MED ORDER — SODIUM CHLORIDE 0.9 % IV SOLN
350.0000 mg | Freq: Once | INTRAVENOUS | Status: AC
  Administered 2021-12-31: 350 mg via INTRAVENOUS
  Filled 2021-12-31: qty 7

## 2021-12-31 MED ORDER — SODIUM CHLORIDE 0.9% IV SOLUTION
250.0000 mL | Freq: Once | INTRAVENOUS | Status: AC
  Administered 2021-12-31: 250 mL via INTRAVENOUS

## 2021-12-31 MED ORDER — SODIUM CHLORIDE 0.9 % IV SOLN: Freq: Once | INTRAVENOUS | Status: DC

## 2022-01-01 ENCOUNTER — Other Ambulatory Visit: Payer: Self-pay

## 2022-01-01 LAB — BPAM RBC
Blood Product Expiration Date: 202309242359
ISSUE DATE / TIME: 202309010747
Unit Type and Rh: 6200

## 2022-01-01 LAB — TYPE AND SCREEN
ABO/RH(D): A POS
Antibody Screen: NEGATIVE
Unit division: 0

## 2022-01-01 LAB — T4: T4, Total: 9.4 ug/dL (ref 4.5–12.0)

## 2022-01-02 ENCOUNTER — Other Ambulatory Visit: Payer: Self-pay

## 2022-01-03 ENCOUNTER — Other Ambulatory Visit: Payer: Self-pay | Admitting: Pharmacist

## 2022-01-03 DIAGNOSIS — C4491 Basal cell carcinoma of skin, unspecified: Secondary | ICD-10-CM

## 2022-01-04 ENCOUNTER — Other Ambulatory Visit (HOSPITAL_COMMUNITY): Payer: Self-pay

## 2022-01-05 LAB — SOLUBLE TRANSFERRIN RECEPTOR: Transferrin Receptor: 11.3 nmol/L — ABNORMAL LOW (ref 12.2–27.3)

## 2022-01-10 ENCOUNTER — Other Ambulatory Visit (HOSPITAL_COMMUNITY): Payer: Self-pay

## 2022-01-10 ENCOUNTER — Telehealth: Payer: Self-pay

## 2022-01-10 NOTE — Telephone Encounter (Signed)
I spoke with [name], with Mr Codrington listening in the background. I asked if he had been using Imodium. She replied, "Yes he has and Pepto Bismol". I then asked how much of the Imodium he had been taking? He stated, "just one per day". I gave them the correct directions for the Imodium. Imodium is to be taken up to 8 tabs/day if needed, starting with 2 tabs after 1st loose stool  each day. Pt to then take 1 tab after each loose stool thereafter up to 8 tabs/day. I asked that she give him 2 tabs Imodium right now, and 1 after each loose stool. They will call me in the morning for an update.

## 2022-01-11 NOTE — Telephone Encounter (Addendum)
I called & spoke with [name] to see how Fernando Young is doing today. He is still taking the Imodium and Pepto. He told her it was a little better today. He took 5 Imodium yesterday. Should we send in L[place]otil? He is eating & drinking ok per daughter.

## 2022-01-12 ENCOUNTER — Other Ambulatory Visit: Payer: Self-pay | Admitting: Hematology and Oncology

## 2022-01-12 ENCOUNTER — Inpatient Hospital Stay: Payer: Medicare Other

## 2022-01-12 DIAGNOSIS — R197 Diarrhea, unspecified: Secondary | ICD-10-CM

## 2022-01-12 DIAGNOSIS — K921 Melena: Secondary | ICD-10-CM

## 2022-01-12 MED ORDER — CIPROFLOXACIN HCL 500 MG PO TABS: 500.0000 mg | ORAL_TABLET | Freq: Two times a day (BID) | ORAL | 0 refills | Status: DC

## 2022-01-12 NOTE — Telephone Encounter (Signed)
Pt has arrived at clinic @ 1155 to give [place] stool sample. Lab aware.

## 2022-01-12 NOTE — Progress Notes (Unsigned)
The patient has been having increased diarrhea, so was brought in for stool studies.  The stool was not watery, so C. difficile could not be performed.  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  I prescribed the patient on Cipro 500 mg twice daily for 10 days.  I advised his daughter to try to control the diarrhea with the Pepto-Bismol, as he could get into problems using Imodium.  She knows to contact us if his symptoms do not improve.

## 2022-01-13 ENCOUNTER — Encounter: Payer: Self-pay | Admitting: Hematology and Oncology

## 2022-01-13 ENCOUNTER — Encounter: Payer: Self-pay | Admitting: Oncology

## 2022-01-14 ENCOUNTER — Other Ambulatory Visit: Payer: Self-pay | Admitting: Hematology and Oncology

## 2022-01-14 ENCOUNTER — Other Ambulatory Visit (HOSPITAL_COMMUNITY): Payer: Self-pay

## 2022-01-14 MED ORDER — PANTOPRAZOLE SODIUM 40 MG PO TBEC: 40.0000 mg | DELAYED_RELEASE_TABLET | Freq: Every day | ORAL | 0 refills | Status: DC

## 2022-01-14 NOTE — Progress Notes (Signed)
Due to heme positive stools, we recommend holding acalabrutinib and starting him on Protonix 40 mg twice daily.  I notified his daughter of this.  We may be able to decrease the dose of Protonix if his bleeding stops.  If we resume acalabrutinib, we will need to discontinue Protonix.

## 2022-01-18 NOTE — Progress Notes (Signed)
Fernando Young  [address] [place],  [place]  [address] [phone]  Clinic Day: 01/20/2022  Referring physician: Raelene Bott, MD  ASSESSMENT & PLAN:   Assessment & Plan: 1.  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 However, he had not had problems with splenomegaly, adenopathy, or thrombocytopenia, but his anemia has only partially improved with iron replacement, so may be more from his CLL. We placed him on acalabrutinib but he had worsening lymphocytosis and worsening anemia as well as bleeding from the rectum. So it was stopped in early September 2023. His blood already looks better and so we do not plan to restart it.   2.  Anemia, persistent and improved. He last received IV Feraheme in July of 2022, and had recurrent iron deficiency,so was treated again.   3.  Hyperkalemia, which is chronic, but normal today. It is actually low normal, probably from his recent diarrhea.    4.  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  This is a reported adverse effect and so we changed him to [place].  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  He tolerated this without difficulty and it appears to be helping.  We will continue with his 8th cycle of treatment tomorrow with Libtayo.  I will be adding IV fluids and potassium since his blood pressure is low at 73/47 today. Otherwise, we will see him back in 3 weeks with CBC and CMP. The patient and his daughter understand the plans discussed today and are in agreement with them. They know to contact our office if he develops concerns prior to his next appointment.    [facility] [address] [place] [address] Dept: [phone] Dept Fax: [phone]      CHIEF COMPLAINT:  CC: Chronic lymphocytic leukemia, unresectable skin cancers of the scalp and face  Current Treatment: Libtayo   HISTORY OF PRESENT ILLNESS:  Fernando Young is a 81 y.o. 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  The CLL was confirmed by flow cytometry in July 2017.  He has had slow progression of his lymphocytosis over time, with mild anemia, but has remained on observation.  He has multiple medical comorbidities including diabetes, congestive heart failure, B12 deficiency, cardiac arrhythmias, and history of prostate cancer. The white blood cell count has steadily increased over the years.  He was advised to start therapy years ago but we have opted for observation and he has been largely asymptomatic until now.  In October 2021, he was found to have iron deficiency despite oral iron supplementation. B12 and folate levels were normal.  He received IV iron in form of Feraheme. Oral iron was discontinued, as we did not feel he was absorbing it. Oral B12 was continued.  His hemoglobin improved but did not normalize with the IV iron.  He received IV iron again in July 2022 when his hemoglobin was down to 9.8. The white blood cell count has been running between 100,000 and 116,000 for the past year.  He has been relatively asymptomatic. He was diagnosed with atrial fibrillation.  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 INTERVAL HISTORY:  [name] is here for routine follow up and states that he is feeling weak. He is scheduled to get treatment tomorrow. I will also instruct them to give him IV fluids and potassium with that. His blood pressure is very low at 73/47.  So I have cautioned him to to sedentary and drink plenty of fluids. This is due to his severe diarrhea which is now improving. His daughter notes he has a stool bacteria infection he is taking antibiotics. His daughter notes the stool is black. He has been off the acalabrutinib for about a week now. His white count has dropped from 225,000 to 109,000, with 91% lymphocytes and an ALC of 99,000. His hemoglobin is better at 9.2 but some of this could be his dehydration. His BUN is up to 29 but creatinine normal at 1.2. His total protein is lower at 5.4. He does complain of abdominal pain. His  appetite is fair.  He has gained 5 pounds since his last visit. Blood count is normal. Chemistries are normal. He denies fever, chills or other signs of infection.  He denies nausea or vomiting.  He denies sore throat, cough, dyspnea, or chest pain. He is accompanied by his daughter today as usual.  REVIEW OF SYSTEMS:  Review of Systems  Constitutional:  Negative for appetite change, chills, fatigue, fever and unexpected weight change.  HENT:  Negative.    Eyes: Negative.   Respiratory: Negative.  Negative for chest tightness, cough, hemoptysis, shortness of breath and wheezing.   Cardiovascular: Negative.  Negative for chest pain, leg swelling and palpitations.  Gastrointestinal:  Positive for blood in stool and diarrhea. Negative for abdominal distention, abdominal pain, constipation, nausea and vomiting.       Black stool He has a stool bacteria infection  Endocrine: Negative.   Genitourinary: Negative.  Negative for difficulty urinating, dysuria, frequency and hematuria.   Musculoskeletal:  Negative for arthralgias, back pain, flank pain, gait problem and myalgias.  Skin: Negative.        Extensive skin cancers of the face  Neurological:  Positive for extremity weakness (in a wheelchair today). Negative for dizziness, gait problem, headaches, light-headedness, numbness, seizures and speech difficulty.   Hematological: Negative.   Psychiatric/Behavioral: Negative.  Negative for depression and sleep disturbance. The patient is not nervous/anxious.   All other systems reviewed and are negative.    VITALS:  Blood pressure (!) 73/47, pulse 63, temperature (!) 97.5 F (36.4 C), temperature source Oral, resp. rate 18, height 5' 10.5" (1.791 m), weight 148 lb 6.4 oz (67.3 kg), SpO2 96 %.  Wt Readings from Last 3 Encounters:  01/20/22 148 lb 6.4 oz (67.3 kg)  12/30/21 143 lb 8 oz (65.1 kg)  12/10/21 145 lb (65.8 kg)    Body mass index is 20.99 kg/m.  Performance status (ECOG): 2 - Symptomatic, <50% confined to bed  PHYSICAL EXAM:  Physical Exam Vitals and nursing note reviewed.  Constitutional:      General: He is not in acute distress.    Appearance: Normal appearance. He is normal weight. He is not ill-appearing or toxic-appearing.  HENT:     Head: Normocephalic and atraumatic.     Mouth/Throat:     Mouth: Mucous membranes are moist.     Pharynx: Oropharynx is clear. No oropharyngeal exudate or posterior oropharyngeal erythema.  Eyes:     General: No scleral icterus.    Extraocular Movements: Extraocular movements intact.     Conjunctiva/sclera: Conjunctivae normal.     Pupils: Pupils are equal, round, and reactive to light.  Cardiovascular:     Rate and Rhythm: Normal rate and regular rhythm.     Pulses: Normal pulses.     Heart sounds: Normal heart sounds. Heart sounds not distant. No murmur heard.    No friction rub. No gallop.  Pulmonary:     Effort: Pulmonary effort is normal. No respiratory distress.     Breath sounds: Normal breath sounds. No wheezing, rhonchi or rales.  Abdominal:     General: Bowel sounds are normal. There is no distension.     Palpations: Abdomen is soft. There is no hepatomegaly, splenomegaly or mass.     Tenderness: There is no abdominal tenderness.  Musculoskeletal:        General: Normal range of motion.     Cervical back: Normal range of  motion and neck supple. No tenderness.     Right lower leg: Edema present.     Left lower leg: Edema present.     Comments: 1+ edema of the lower extremities.  Lymphadenopathy:     Cervical: No cervical adenopathy.     Upper Body:     Right upper body: No supraclavicular or axillary adenopathy.     Left upper body: No supraclavicular or axillary adenopathy.     Lower Body: No right inguinal adenopathy. No left inguinal adenopathy.  Skin:    General: Skin is warm and dry.     Coloration: Skin is not jaundiced.     Findings: Lesion (multiple nodular and erythematous skin lesions of the face, left greater than right. Some are keratotic.) present. No rash.     Comments: He has many widespread malignancies of his scalp and face which are red and peeling.  Neurological:     Mental Status: He is alert and oriented to person, place, and time.     Cranial Nerves: No cranial nerve deficit.  Psychiatric:        Mood and Affect: Mood normal.        Behavior: Behavior normal.        Thought Content: Thought content normal.     LABS:      Latest Ref Rng & Units 01/20/2022   12:00 AM 12/30/2021   12:00 AM 12/08/2021   12:00 AM  CBC  WBC  109.9     212.8     222.7      Hemoglobin 13.5 - 17.5 9.2     8.0     8.6      Hematocrit 41 - 53 '30     26     29      '$ Platelets 150 - 400 K/uL 188     157     160         This result is from an external source.      Latest Ref Rng & Units 01/20/2022   12:00 AM 12/30/2021   12:00 AM 12/08/2021   12:00 AM  CMP  BUN 4 - '21 29     31     30      '$ Creatinine 0.6 - 1.3 1.2     1.1     1.0      Sodium 137 - 147 139     139     139  Potassium 3.5 - 5.1 mEq/L 3.7     4.1     4.3      Chloride 99 - 108 109     106     106      CO2 13 - '22 23     26     28      '$ Calcium 8.7 - 10.7 8.3     8.5     8.5      Total Protein 6.3 - 8.2 g/dL   5.8      Alkaline Phos 25 - 125 53     55     71      AST 14 - 40 '21     21     29      '$ ALT 10 - 40 U/L '21     23     31          '$ This result is from an external source.    Lab Results  Component Value Date   TIBC 267 12/31/2021   TIBC 291 09/15/2021   TIBC 341 06/04/2021   FERRITIN 248 12/31/2021   FERRITIN 48 09/15/2021   FERRITIN 53 06/04/2021   IRONPCTSAT 22 12/31/2021   IRONPCTSAT 13 (L) 09/15/2021   IRONPCTSAT 11 (L) 06/04/2021   Lab Results  Component Value Date   LDH 166 08/03/2020    STUDIES:  No results found.  EXAM:3/05/202 DG ABD 1 VIEW CLINICAL DATA:  Evaluate for small bowel obstruction.   EXAM: ABDOMEN - 1 VIEW   COMPARISON:  None   FINDINGS: There is a single dilated loop of small bowel within the central abdomen measuring up to 4.5 cm. Decreased colonic gas noted. No abnormal abdominal or pelvic calcifications.   IMPRESSION: Single dilated loop of small bowel within the central abdomen compatible with either small bowel obstruction or ileus.  HISTORY:   Allergies: No Known Allergies  Current Medications: Current Outpatient Medications  Medication Sig Dispense Refill   acalabrutinib maleate (CALQUENCE) 100 MG tablet Take 100 mg by mouth 2 (two) times daily. 60 tablet 2   amiodarone (PACERONE) 200 MG tablet Take 200 mg by mouth daily.     Ascorbic Acid (VITAMIN C) 500 MG CAPS Take 500 mg by mouth daily.     benazepril (LOTENSIN) 20 MG tablet Take 20 mg by mouth daily. Taking half a tab do to low BP per PCP, not wanting to stop meds with heart hx     bismuth subsalicylate (PEPTO BISMOL) 262 MG/15ML suspension Take 30 mLs by mouth every 6 (six) hours as needed. For constipation     cholecalciferol (VITAMIN D3) 25 MCG (1000 UNIT) tablet Take 1,000 Units by mouth daily.     ciprofloxacin (CIPRO) 500 MG tablet Take 1 tablet (500 mg total) by mouth 2 (two) times daily. 20 tablet 0   Dextromethorphan-guaiFENesin (CORICIDIN HBP CONGESTION/COUGH PO) Take 1 tablet by mouth every 12 (twelve) hours as needed (cold symptoms).     ELIQUIS 5 MG TABS tablet Take 5 mg by mouth 2 (two)  times daily.     furosemide (LASIX) 40 MG tablet Take 1 tablet by mouth daily.     ibuprofen (ADVIL) 200 MG tablet Take 200 mg by mouth daily as needed for mild pain.     ketoconazole (NIZORAL) 2 % shampoo Apply topically 3 (three) times a week.     levothyroxine (SYNTHROID) 75 MCG tablet Take 1 tablet by mouth daily.  loperamide (IMODIUM) 2 MG capsule Take 2 capsules by mouth as needed. After 1st loose BM per day. Then 1 cap after each loose stool thereafter up to 8 per day.     metFORMIN (GLUCOPHAGE-XR) 500 MG 24 hr tablet Take 500 mg by mouth 2 (two) times daily.     neomycin-polymyxin b-dexamethasone (MAXITROL) 3.5-10000-0.1 OINT Administer 1 application to the right eye two (2) times a day.     Omega-3 Fatty Acids (FISH OIL) 1000 MG CAPS Take 1,000 mg by mouth daily.     pantoprazole (PROTONIX) 40 MG tablet Take 1 tablet (40 mg total) by mouth daily. 60 tablet 0   rOPINIRole (REQUIP) 0.5 MG tablet Take 0.5-1 mg by mouth See admin instructions. Taking one tablet at bedtime, if no relief take an additional tablet     rosuvastatin (CRESTOR) 20 MG tablet Take 10 mg by mouth daily.     vitamin B-12 (CYANOCOBALAMIN) 500 MCG tablet Take 500 mcg by mouth daily.     Current Facility-Administered Medications  Medication Dose Route Frequency Provider Last Rate Last Admin   sodium chloride flush (NS) 0.9 % injection 10 mL  10 mL Intracatheter PRN Mosher, Vida Roller A, PA-C   10 mL at 01/20/22 1610     I,Gabriella Ballesteros,acting as a scribe for Derwood Kaplan, MD.,have documented all relevant documentation on the behalf of Derwood Kaplan, MD,as directed by  Derwood Kaplan, MD while in the presence of Derwood Kaplan, MD.

## 2022-01-19 ENCOUNTER — Encounter: Payer: Self-pay | Admitting: Oncology

## 2022-01-19 LAB — GASTROINTESTINAL PANEL BY PCR, STOOL (REPLACES STOOL CULTURE)
Adenovirus F 40/41: NOT DETECTED
Astrovirus: NOT DETECTED
Campylobacter: NOT DETECTED
Cryptosporidium: NOT DETECTED
Cyclospora cayetanensis: NOT DETECTED
Entamoeba histolytica: NOT DETECTED
Enteroaggregative E coli: NOT DETECTED
Enteropathogenic E coli: DETECTED
Giardia lamblia: NOT DETECTED
Norovirus GI/GII: NOT DETECTED
Plesiomonas shigelloides: NOT DETECTED
Rotavirus A: NOT DETECTED
Salmonella: NOT DETECTED
Sapovirus: NOT DETECTED
Shigella/Enteroinvasive E coli (EIEC): NOT DETECTED
Vibrio cholerae: NOT DETECTED
Vibrio: NOT DETECTED
Yersinia enterocolitica: NOT DETECTED

## 2022-01-19 LAB — HEMOCCULT GUIAC POC 1CARD (OFFICE)
Card #2 Fecal Occult Blod, POC: POSITIVE
Card #3 Fecal Occult Blood, POC: POSITIVE
Fecal Occult Blood, POC: POSITIVE — AB

## 2022-01-20 ENCOUNTER — Inpatient Hospital Stay (INDEPENDENT_AMBULATORY_CARE_PROVIDER_SITE_OTHER): Payer: Medicare Other | Admitting: Oncology

## 2022-01-20 ENCOUNTER — Encounter: Payer: Self-pay | Admitting: Oncology

## 2022-01-20 ENCOUNTER — Telehealth: Payer: Self-pay

## 2022-01-20 ENCOUNTER — Inpatient Hospital Stay: Payer: Medicare Other

## 2022-01-20 VITALS — BP 73/47 | HR 63 | Temp 97.5°F | Resp 18 | Ht 70.5 in | Wt 148.4 lb

## 2022-01-20 DIAGNOSIS — C9110 Chronic lymphocytic leukemia of B-cell type not having achieved remission: Secondary | ICD-10-CM | POA: Diagnosis not present

## 2022-01-20 DIAGNOSIS — C4491 Basal cell carcinoma of skin, unspecified: Secondary | ICD-10-CM

## 2022-01-20 DIAGNOSIS — Z5112 Encounter for antineoplastic immunotherapy: Secondary | ICD-10-CM | POA: Diagnosis not present

## 2022-01-20 DIAGNOSIS — D508 Other iron deficiency anemias: Secondary | ICD-10-CM

## 2022-01-20 DIAGNOSIS — D509 Iron deficiency anemia, unspecified: Secondary | ICD-10-CM | POA: Diagnosis not present

## 2022-01-20 LAB — CBC AND DIFFERENTIAL
HCT: 30 — AB (ref 41–53)
Hemoglobin: 9.2 — AB (ref 13.5–17.5)
Neutrophils Absolute: 7.69
Platelets: 188 10*3/uL (ref 150–400)
WBC: 109.9

## 2022-01-20 LAB — CBC: RBC: 3.1 — AB (ref 3.87–5.11)

## 2022-01-20 LAB — BASIC METABOLIC PANEL
BUN: 29 — AB (ref 4–21)
CO2: 23 — AB (ref 13–22)
Chloride: 109 — AB (ref 99–108)
Creatinine: 1.2 (ref 0.6–1.3)
Glucose: 97
Potassium: 3.7 mEq/L (ref 3.5–5.1)
Sodium: 139 (ref 137–147)

## 2022-01-20 LAB — HEPATIC FUNCTION PANEL
ALT: 21 U/L (ref 10–40)
AST: 21 (ref 14–40)
Alkaline Phosphatase: 53 (ref 25–125)
Bilirubin, Total: 0.4

## 2022-01-20 LAB — COMPREHENSIVE METABOLIC PANEL WITH GFR
Albumin: 3.1 — AB (ref 3.5–5.0)
Calcium: 8.3 — AB (ref 8.7–10.7)

## 2022-01-20 MED ORDER — HEPARIN SOD (PORK) LOCK FLUSH 100 UNIT/ML IV SOLN
500.0000 [IU] | Freq: Once | INTRAVENOUS | Status: AC | PRN
  Administered 2022-01-20: 500 [IU]

## 2022-01-20 MED ORDER — SODIUM CHLORIDE 0.9% FLUSH
10.0000 mL | INTRAVENOUS | Status: DC | PRN
  Administered 2022-01-20: 10 mL

## 2022-01-20 MED FILL — Cemiplimab-rwlc IV Soln 350 MG/7ML (50 MG/ML): INTRAVENOUS | Qty: 7 | Status: AC

## 2022-01-20 NOTE — Telephone Encounter (Signed)
CRITICAL VALUE STICKER  CRITICAL VALUE: WBC 109.9  RECEIVER (on-site recipient of call):Levada Dy, [place] NOTIFIED: 01/20/22 at [place] (representative from lab):Rosendo Gros in [facility]  MD NOTIFIED: Dr. Hinton Rao  TIME OF NOTIFICATION:1640  RESPONSE:  That is half of what it has been

## 2022-01-21 ENCOUNTER — Ambulatory Visit: Payer: Medicare Other

## 2022-01-21 ENCOUNTER — Encounter: Payer: Self-pay | Admitting: Oncology

## 2022-01-21 ENCOUNTER — Inpatient Hospital Stay: Payer: Medicare Other

## 2022-01-21 VITALS — BP 101/76 | HR 63 | Temp 97.6°F | Resp 18

## 2022-01-21 DIAGNOSIS — C4491 Basal cell carcinoma of skin, unspecified: Secondary | ICD-10-CM

## 2022-01-21 DIAGNOSIS — Z5112 Encounter for antineoplastic immunotherapy: Secondary | ICD-10-CM | POA: Diagnosis not present

## 2022-01-21 LAB — TSH: TSH: 1.544 u[IU]/mL (ref 0.350–4.500)

## 2022-01-21 MED ORDER — SODIUM CHLORIDE 0.9 % IV SOLN: Freq: Once | INTRAVENOUS | Status: AC

## 2022-01-21 MED ORDER — SODIUM CHLORIDE 0.9% FLUSH
10.0000 mL | INTRAVENOUS | Status: DC | PRN
  Administered 2022-01-21: 10 mL

## 2022-01-21 MED ORDER — SODIUM CHLORIDE 0.9 % IV SOLN: Freq: Once | INTRAVENOUS | Status: DC

## 2022-01-21 MED ORDER — SODIUM CHLORIDE 0.9 % IV SOLN
350.0000 mg | Freq: Once | INTRAVENOUS | Status: AC
  Administered 2022-01-21: 350 mg via INTRAVENOUS
  Filled 2022-01-21: qty 7

## 2022-01-21 MED ORDER — HEPARIN SOD (PORK) LOCK FLUSH 100 UNIT/ML IV SOLN
500.0000 [IU] | Freq: Once | INTRAVENOUS | Status: AC | PRN
  Administered 2022-01-21: 500 [IU]

## 2022-01-21 MED ORDER — POTASSIUM CHLORIDE 10 MEQ/100ML IV SOLN
10.0000 meq | Freq: Once | INTRAVENOUS | Status: AC
  Administered 2022-01-21: 10 meq via INTRAVENOUS
  Filled 2022-01-21: qty 100

## 2022-01-21 NOTE — Patient Instructions (Signed)
Potassium Chloride Injection What is this medication? POTASSIUM CHLORIDE (poe TASS i um KLOOR ide) prevents and treats low levels of potassium in your body. Potassium plays an important role in maintaining the health of your kidneys, heart, muscles, and nervous system. This medicine may be used for other purposes; ask your health care provider or pharmacist if you have questions. COMMON BRAND NAME(S): PROAMP What should I tell my care team before I take this medication? They need to know if you have any of these conditions: Addison disease Dehydration Diabetes (high blood sugar) Heart disease High levels of potassium in the blood Irregular heartbeat or rhythm Kidney disease Large areas of burned skin An unusual or allergic reaction to potassium, other medications, foods, dyes, or preservatives Pregnant or trying to get pregnant Breast-feeding How should I use this medication? This medication is injected into a vein. It is given in a hospital or clinic setting. Talk to your care team about the use of this medication in children. Special care may be needed. Overdosage: If you think you have taken too much of this medicine contact a poison control center or emergency room at once. NOTE: This medicine is only for you. Do not share this medicine with others. What if I miss a dose? This does not apply. This medication is not for regular use. What may interact with this medication? Do not take this medication with any of the following: Certain diuretics such as spironolactone, triamterene Eplerenone Sodium polystyrene sulfonate This medication may also interact with the following: Certain medications for blood pressure or heart disease like lisinopril, losartan, quinapril, valsartan Medications that lower your chance of fighting infection such as cyclosporine, tacrolimus NSAIDs, medications for pain and inflammation, like ibuprofen or naproxen Other potassium supplements Salt  substitutes This list may not describe all possible interactions. Give your health care provider a list of all the medicines, herbs, non-prescription drugs, or dietary supplements you use. Also tell them if you smoke, drink alcohol, or use illegal drugs. Some items may interact with your medicine. What should I watch for while using this medication? Visit your care team for regular checks on your progress. Tell your care team if your symptoms do not start to get better or if they get worse. You may need blood work while you are taking this medication. Avoid salt substitutes unless you are told otherwise by your care team. What side effects may I notice from receiving this medication? Side effects that you should report to your care team as soon as possible: Allergic reactions--skin rash, itching, hives, swelling of the face, lips, tongue, or throat High potassium level--muscle weakness, fast or irregular heartbeat Side effects that usually do not require medical attention (report to your care team if they continue or are bothersome): Diarrhea Nausea Stomach pain Vomiting This list may not describe all possible side effects. Call your doctor for medical advice about side effects. You may report side effects to FDA at 1-800-FDA-1088. Where should I keep my medication? This medication is given in a hospital or clinic. It will not be stored at home. NOTE: This sheet is a summary. It may not cover all possible information. If you have questions about this medicine, talk to your doctor, pharmacist, or health care provider.  2023 Elsevier/Gold Standard (2020-07-30 00:00:00) Cemiplimab Injection What is this medication? CEMIPLIMAB (se MIP li mab) treats skin cancer and lung cancer. It works by helping your immune system slow or stop the spread of cancer cells. It is a monoclonal antibody.  This medicine may be used for other purposes; ask your health care provider or pharmacist if you have  questions. COMMON BRAND NAME(S): LIBTAYO What should I tell my care team before I take this medication? They need to know if you have any of these conditions: Allogeneic stem cell transplant (uses someone else's stem cells) Autoimmune diseases, such as Crohn's disease, ulcerative colitis, or lupus Diabetes Nervous system problems, such as myasthenia gravis or Guillain-Barre syndrome Organ transplant Recent or ongoing radiation Thyroid disease An unusual or allergic reaction to cemiplimab, other medications, foods, dyes, or preservatives Pregnant or trying to get pregnant Breast-feeding How should I use this medication? This medication is infused into a vein. It is given by your care team in a hospital or clinic setting. A special MedGuide will be given to you before each treatment. Be sure to read this information carefully each time. Talk to your care team about the use of this medication in children. Special care may be needed. Overdosage: If you think you have taken too much of this medicine contact a poison control center or emergency room at once. NOTE: This medicine is only for you. Do not share this medicine with others. What if I miss a dose? Keep appointments for follow-up doses. It is important not to miss your dose. Call your care team if you are unable to keep an appointment. What may interact with this medication? Interactions have not been studied. This list may not describe all possible interactions. Give your health care provider a list of all the medicines, herbs, non-prescription drugs, or dietary supplements you use. Also tell them if you smoke, drink alcohol, or use illegal drugs. Some items may interact with your medicine. What should I watch for while using this medication? This medication may make you feel generally unwell. This is not uncommon as chemotherapy can affect healthy cells as well as cancer cells. Report any side effects. Continue your course of treatment  even though you feel ill until your care team tells you to stop. You may need blood work done while you are taking this medication. This medication may cause serious skin reactions. They can happen in the weeks to months after starting the medication. Contact your care team right away if you notice fevers or flu-like symptoms with a rash. The rash may be red or purple and then turn into blisters or peeling of the skin. You may also notice a red rash with swelling of the face, lips, or lymph nodes in your neck or under your arms. Tell your care team right away if you have any changes in your vision. This medication may increase blood sugar. The risk may be higher in patients who already have diabetes. Ask your care team what you can do to lower your risk of diabetes while taking this medication. Talk to your care team if you wish to become pregnant or think you might be pregnant. This medication can cause serious birth defects if taken during pregnancy. A negative pregnancy test is required before starting this medication. A reliable form of contraception is recommended while taking this medication and for at least 4 months after stopping it. Do not breast-feed while taking this medication and for at least 4 months after stopping it. What side effects may I notice from receiving this medication? Side effects that you should report to your care team as soon as possible: Allergic reactions--skin rash, itching, hives, swelling of the face, lips, tongue, or throat Dry cough, shortness of breath  or trouble breathing Eye pain, redness, irritation, or discharge with blurry or decreased vision Heart muscle inflammation--unusual weakness or fatigue, shortness of breath, chest pain, fast or irregular heartbeat, dizziness, swelling of the ankles, feet, or hands Hormone gland problems--headache, sensitivity to light, unusual weakness or fatigue, dizziness, fast or irregular heartbeat, increased sensitivity to cold or  heat, excessive sweating, constipation, hair loss, increased thirst or amount of urine, tremors or shaking, irritability Infusion reactions--chest pain, shortness of breath or trouble breathing, feeling faint or lightheaded Kidney injury (glomerulonephritis)--decrease in the amount of urine, red or dark brown urine, foamy or bubbly urine, swelling of the ankles, hands, or feet Liver injury--right upper belly pain, loss of appetite, nausea, light-colored stool, dark yellow or brown urine, yellowing skin or eyes, unusual weakness or fatigue Pain, tingling, or numbness in the hands or feet, muscle weakness, change in vision, confusion or trouble speaking, loss of balance or coordination, trouble walking, seizures Rash, fever, and swollen lymph nodes Redness, blistering, peeling, or loosening of the skin, including inside the mouth Sudden or severe stomach pain, bloody diarrhea, fever, nausea, vomiting Side effects that usually do not require medical attention (report these to your care team if they continue or are bothersome): Bone, joint, or muscle pain Diarrhea Fatigue Loss of appetite Nausea Skin rash This list may not describe all possible side effects. Call your doctor for medical advice about side effects. You may report side effects to FDA at 1-800-FDA-1088. Where should I keep my medication? This medication is given in a hospital or clinic and will not be stored at home. NOTE: This sheet is a summary. It may not cover all possible information. If you have questions about this medicine, talk to your doctor, pharmacist, or health care provider.  2023 Elsevier/Gold Standard (2021-03-24 00:00:00)

## 2022-01-22 ENCOUNTER — Other Ambulatory Visit: Payer: Self-pay

## 2022-01-24 ENCOUNTER — Other Ambulatory Visit: Payer: Self-pay

## 2022-01-24 DIAGNOSIS — C4491 Basal cell carcinoma of skin, unspecified: Secondary | ICD-10-CM

## 2022-01-28 ENCOUNTER — Other Ambulatory Visit (HOSPITAL_COMMUNITY): Payer: Self-pay

## 2022-02-02 ENCOUNTER — Other Ambulatory Visit (HOSPITAL_COMMUNITY): Payer: Self-pay

## 2022-02-04 ENCOUNTER — Other Ambulatory Visit: Payer: Self-pay | Admitting: Oncology

## 2022-02-04 DIAGNOSIS — C4491 Basal cell carcinoma of skin, unspecified: Secondary | ICD-10-CM

## 2022-02-07 ENCOUNTER — Encounter: Payer: Self-pay | Admitting: Oncology

## 2022-02-07 ENCOUNTER — Encounter: Payer: Self-pay | Admitting: Hematology and Oncology

## 2022-02-09 ENCOUNTER — Other Ambulatory Visit (HOSPITAL_COMMUNITY): Payer: Self-pay

## 2022-02-10 ENCOUNTER — Other Ambulatory Visit: Payer: Medicare Other

## 2022-02-10 ENCOUNTER — Ambulatory Visit: Payer: Medicare Other | Admitting: Oncology

## 2022-02-10 ENCOUNTER — Inpatient Hospital Stay: Payer: Medicare Other | Attending: Oncology

## 2022-02-10 ENCOUNTER — Encounter: Payer: Self-pay | Admitting: Hematology and Oncology

## 2022-02-10 ENCOUNTER — Inpatient Hospital Stay (INDEPENDENT_AMBULATORY_CARE_PROVIDER_SITE_OTHER): Payer: Medicare Other | Admitting: Hematology and Oncology

## 2022-02-10 ENCOUNTER — Other Ambulatory Visit (HOSPITAL_COMMUNITY): Payer: Self-pay

## 2022-02-10 ENCOUNTER — Encounter: Payer: Self-pay | Admitting: Oncology

## 2022-02-10 VITALS — BP 84/54 | HR 70 | Temp 98.1°F | Resp 16 | Ht 70.5 in | Wt 145.6 lb

## 2022-02-10 DIAGNOSIS — C9110 Chronic lymphocytic leukemia of B-cell type not having achieved remission: Secondary | ICD-10-CM

## 2022-02-10 DIAGNOSIS — C4440 Unspecified malignant neoplasm of skin of scalp and neck: Secondary | ICD-10-CM | POA: Diagnosis not present

## 2022-02-10 DIAGNOSIS — C4491 Basal cell carcinoma of skin, unspecified: Secondary | ICD-10-CM | POA: Insufficient documentation

## 2022-02-10 DIAGNOSIS — D509 Iron deficiency anemia, unspecified: Secondary | ICD-10-CM

## 2022-02-10 DIAGNOSIS — I4891 Unspecified atrial fibrillation: Secondary | ICD-10-CM | POA: Insufficient documentation

## 2022-02-10 DIAGNOSIS — Z79899 Other long term (current) drug therapy: Secondary | ICD-10-CM | POA: Diagnosis not present

## 2022-02-10 DIAGNOSIS — Z452 Encounter for adjustment and management of vascular access device: Secondary | ICD-10-CM | POA: Insufficient documentation

## 2022-02-10 LAB — CBC AND DIFFERENTIAL
HCT: 31 — AB (ref 41–53)
Hemoglobin: 10.2 — AB (ref 13.5–17.5)
MCV: 93 (ref 80–94)
Neutrophils Absolute: 2.74
Platelets: 196 10*3/uL (ref 150–400)
WBC: 15.2

## 2022-02-10 LAB — HEPATIC FUNCTION PANEL
ALT: 19 U/L (ref 10–40)
AST: 19 (ref 14–40)
Alkaline Phosphatase: 53 (ref 25–125)
Bilirubin, Total: 0.4

## 2022-02-10 LAB — CBC: RBC: 3.36 — AB (ref 3.87–5.11)

## 2022-02-10 LAB — COMPREHENSIVE METABOLIC PANEL
Albumin: 3.3 — AB (ref 3.5–5.0)
Calcium: 8.6 — AB (ref 8.7–10.7)

## 2022-02-10 LAB — BASIC METABOLIC PANEL
BUN: 21 (ref 4–21)
CO2: 23 — AB (ref 13–22)
Chloride: 107 (ref 99–108)
Creatinine: 1.2 (ref 0.6–1.3)
Glucose: 124
Potassium: 4.2 mEq/L (ref 3.5–5.1)
Sodium: 136 — AB (ref 137–147)

## 2022-02-10 LAB — TSH: TSH: 1.543 u[IU]/mL (ref 0.350–4.500)

## 2022-02-10 LAB — CORRECTED CALCIUM (CC13): Calcium, Corrected: 9.3

## 2022-02-10 LAB — PROTEIN, TOTAL: Total Protein: 5.7 g/dL — AB (ref 6.3–8.2)

## 2022-02-10 MED ORDER — HEPARIN SOD (PORK) LOCK FLUSH 100 UNIT/ML IV SOLN
500.0000 [IU] | Freq: Once | INTRAVENOUS | Status: AC | PRN
  Administered 2022-02-10: 500 [IU]

## 2022-02-10 MED ORDER — SODIUM CHLORIDE 0.9% FLUSH
10.0000 mL | INTRAVENOUS | Status: DC | PRN
  Administered 2022-02-10: 10 mL

## 2022-02-10 MED FILL — Cemiplimab-rwlc IV Soln 350 MG/7ML (50 MG/ML): INTRAVENOUS | Qty: 7 | Status: AC

## 2022-02-10 NOTE — Assessment & Plan Note (Addendum)
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  He last received IV iron replacement in June.  He required transfusion in August when his hemoglobin dropped to 8 and he was symptomatic.  His hemoglobin has improved at this time.  He states his stools are not dark as previous.  He likely has decreased blood loss with discontinuation of acalabrutinib.  He remains on apixaban due to atrial fibrillation.

## 2022-02-10 NOTE — Assessment & Plan Note (Addendum)
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  His skin cancer is now being treated with cemiplimab and he is tolerating this well.  He has worsening anemia, now requiring transfusion.  This is likely due to his CLL, as he received IV iron recently. He started acalabrutinib 100 mg twice daily for his CLL on July 21st. He has had an increase in his white blood cell count since starting acalabrutinib.  An intial increase in the lymphocytosis is common with acalabrutinib.  His white blood cell count has decreased somewhat from 222,000 to 212,000.  He has persistent anemia without definite nutritional deficiency, so this was felt to be due to his CLL.  He then reported black stool.  He was started on pantoprazole 40 mg daily.  He had worsening diarrhea and stool studies revealed enteropathogenic E. Coli, which was treated with ciprofloxacin.   He had persistent melana and stool Hemoccults x3 were positive, so alcalabrutinib was discontinued on September 15th.  His white blood cell count decreased to 109,900 and his hemoglobin improved to 9.2 on September 21.  He states the melena has resolved and his hemoglobin has improved to 10.2.  His white blood cell count was reported as 15,200, which seemed unlikely, but was 15,400 with a second blood sample.  We are definitely puzzled by this.  We will continue to follow him closely and plan to see him back in 2 to 3 weeks with a CBC and comprehensive metabolic panel.

## 2022-02-10 NOTE — Assessment & Plan Note (Signed)
Multiple skin cancers, especially covering his face, for which he had been on Erivedge therapy.  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  He received 8 cycles with improvement in his skin lesions.  Unfortunately, he has had progressive worsening fatigue.  He is unable to do his normal activities as desired.  I therefore recommend taking a break from cemiplimab.  We will cancel his infusion this week.

## 2022-02-10 NOTE — Progress Notes (Signed)
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  He received 8 cycles with improvement in his skin lesions.  Unfortunately, he has had progressive worsening fatigue.  He is unable to do his normal activities as desired.  I therefore recommend taking a break from cemiplimab.  We will cancel his infusion this week.    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  His skin cancer is now being treated with cemiplimab and he is tolerating this well.  He has worsening anemia, now requiring transfusion.  This is likely due to his CLL, as he received IV iron recently. He started acalabrutinib 100 mg twice daily for his CLL on July 21st. He has had an increase in his white blood cell count since starting acalabrutinib.  An intial increase in the lymphocytosis is common with acalabrutinib.  His white blood cell count has decreased somewhat from 222,000 to 212,000.  He has persistent anemia without definite nutritional deficiency, so this was felt to be due to his CLL.  He then reported black stool.  He was started on pantoprazole 40 mg daily.  He had worsening diarrhea and stool studies revealed enteropathogenic E. Coli, which was treated with ciprofloxacin.   He had persistent melana and stool Hemoccults x3 were positive, so alcalabrutinib was discontinued on September 15th.  His white blood cell count decreased to  109,900 and his hemoglobin improved to 9.2 on September 21.  He states the melena has resolved and his hemoglobin has improved to 10.2.  His white blood cell count was reported as 15,200, which seemed unlikely, but was 15,400 with a second blood sample.  We are definitely puzzled by this.  We will continue to follow him closely and plan to see him back in 2 to 3 weeks with a CBC and comprehensive metabolic panel.  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  He last received IV iron replacement in June.  He required transfusion in August when his hemoglobin dropped to 8 and he was symptomatic.  His hemoglobin has improved at this time.  He states his stools are not dark as previous.  He likely has decreased blood loss with discontinuation of acalabrutinib.  He remains on apixaban due to atrial fibrillation.   The patient understand and his daughter the plans discussed today and is in agreement with them.  They know to contact our office if he develops concerns prior to his next appointment.      Marvia Pickles, PA-C  [facility] [address] [place] [address] Dept: [phone] Dept Fax: [phone]   Orders Placed This Encounter  Procedures   Basic metabolic panel    This external order was created through the Results Console.   Comprehensive metabolic panel  This external order was created through the Results Console.   Hepatic function panel    This external order was created through the Results Console.   Protein, total    This order was created through External Result Entry   Corrected Calcium    This order was created through External Result Entry   CBC and differential    This external order was created through the Results Console.   CBC    This external order was created through the Results  Console.      CHIEF COMPLAINT:  CC:  Squamous cell skin cancers  Current Treatment: Cemiplimab  HISTORY OF PRESENT ILLNESS:   Oncology History  CLL (chronic lymphocytic leukemia) ([place])  05/25/2015 Initial Diagnosis   CLL (chronic lymphocytic leukemia) ([place])   02/04/2021 Cancer Staging   Staging form: Chronic Lymphocytic Leukemia / Small Lymphocytic Lymphoma, AJCC 8th Edition - Clinical stage from 02/04/2021: Modified Rai Stage III (Modified Rai risk: High, Binet: Stage A, Lymphocytosis: Present, Adenopathy: Absent, Organomegaly: Absent, Anemia: Present, Thrombocytopenia: Absent) - Signed by Derwood Kaplan, MD on 02/08/2021 Stage prefix: Recurrence Absolute lymphocyte count (ALC) (cells/uL): 100 Hemoglobin (Hgb) (g/dL): 11.4 Platelet count (x10E9/L of blood): 184   Cancer of the skin, basal cell  05/06/2021 Initial Diagnosis   Cancer of the skin, basal cell   08/30/2021 - 12/31/2021 Chemotherapy   Patient is on Treatment Plan : [place] q21d     08/30/2021 -  Chemotherapy   Patient is on Treatment Plan : ADVANCED CUTANEOUS SQUAMOUS CELL CARCINOMA Cemiplimab q21d         INTERVAL HISTORY:  Ina is here today for repeat clinical assessment prior to a 9th cycle of cemiplimab.  He reports worsening fatigue and general weakness, especially of his legs.  He is unable to walk as much as previous.  He previously underwent physical therapy and states he is doing exercises at home without improvement.  He still has intermittent diarrhea, but improved from previous.  He is using Pepto-Bismol as needed.  He denies persistent dark stool.  He continues pantoprazole daily. He denies fevers or chills. He denies pain. His appetite is good. His weight has decreased 3 pounds over last 3 weeks .  REVIEW OF SYSTEMS:  Review of Systems  Constitutional:  Positive for fatigue. Negative for appetite change, chills, fever and unexpected weight change.  HENT:   Negative for lump/mass,  mouth sores and sore throat.   Respiratory:  Negative for cough and shortness of breath.   Cardiovascular:  Negative for chest pain and leg swelling.  Gastrointestinal:  Negative for abdominal pain, constipation, diarrhea, nausea and vomiting.  Genitourinary:  Negative for difficulty urinating, dysuria, frequency and hematuria.   Musculoskeletal:  Negative for arthralgias, back pain and myalgias.  Skin:  Negative for itching, rash and wound.  Neurological:  Negative for dizziness, extremity weakness, headaches, light-headedness and numbness.  Hematological:  Negative for adenopathy.  Psychiatric/Behavioral:  Negative for depression and sleep disturbance. The patient is not nervous/anxious.      VITALS:  Blood pressure (!) 84/54, pulse 70, temperature 98.1 F (36.7 C), temperature source Oral, resp. rate 16, height 5' 10.5" (1.791 m), weight 145 lb 9.6 oz (66 kg), SpO2 98 %.  Wt Readings from Last 3 Encounters:  02/10/22 145 lb 9.6 oz (66 kg)  01/20/22 148 lb 6.4 oz (67.3 kg)  12/30/21 143 lb 8 oz (65.1 kg)    Body mass index is 20.6 kg/m.  Performance status (ECOG): 2 -  Symptomatic, <50% confined to bed  PHYSICAL EXAM:  Physical Exam Vitals and nursing note reviewed.  Constitutional:      General: He is not in acute distress.    Appearance: Normal appearance. He is normal weight.  HENT:     Head: Normocephalic and atraumatic.     Mouth/Throat:     Mouth: Mucous membranes are moist.     Pharynx: Oropharynx is clear. No oropharyngeal exudate or posterior oropharyngeal erythema.  Eyes:     General: No scleral icterus.    Extraocular Movements: Extraocular movements intact.     Conjunctiva/sclera: Conjunctivae normal.     Pupils: Pupils are equal, round, and reactive to light.  Cardiovascular:     Rate and Rhythm: Normal rate and regular rhythm.     Heart sounds: Normal heart sounds. No murmur heard.    No friction rub. No gallop.  Pulmonary:     Effort: Pulmonary effort is  normal.     Breath sounds: Normal breath sounds. No wheezing, rhonchi or rales.  Abdominal:     General: Bowel sounds are normal. There is no distension.     Palpations: Abdomen is soft. There is no hepatomegaly, splenomegaly or mass.     Tenderness: There is no abdominal tenderness.  Musculoskeletal:        General: Normal range of motion.     Cervical back: Normal range of motion and neck supple. No tenderness.     Right lower leg: No edema.     Left lower leg: No edema.  Lymphadenopathy:     Cervical: No cervical adenopathy.     Upper Body:     Right upper body: No supraclavicular or axillary adenopathy.     Left upper body: No supraclavicular or axillary adenopathy.  Skin:    General: Skin is warm and dry.     Coloration: Skin is not jaundiced.     Findings: Lesion present. No rash.     Comments: The lesions of his right face are doing quite well.  There is a single raised lesion of the left cheek.  He has persistent lesions of the nose with overlying eschar.  Neurological:     Mental Status: He is alert and oriented to person, place, and time.     Cranial Nerves: No cranial nerve deficit.  Psychiatric:        Mood and Affect: Mood normal.        Behavior: Behavior normal.        Thought Content: Thought content normal.     LABS:      Latest Ref Rng & Units 02/10/2022   12:00 AM 01/20/2022   12:00 AM 12/30/2021   12:00 AM  CBC  WBC  15.2     109.9     212.8      Hemoglobin 13.5 - 17.5 10.2     9.2     8.0      Hematocrit 41 - 53 '31     30     26      '$ Platelets 150 - 400 K/uL 196     188     157         This result is from an external source.      Latest Ref Rng & Units 02/10/2022   12:00 AM 01/20/2022   12:00 AM 12/30/2021   12:00 AM  CMP  BUN 4 - '21 21     29     '$ 31  Creatinine 0.6 - 1.3 1.2     1.2     1.1      Sodium 137 - 147 136     139     139      Potassium 3.5 - 5.1 mEq/L 4.2     3.7     4.1      Chloride 99 - 108 107     109     106      CO2 13 -  '22 23     23     26      '$ Calcium 8.7 - 10.7 8.6     8.3     8.5      Total Protein 6.3 - 8.2 g/dL 5.7        Alkaline Phos 25 - 125 53     53     55      AST 14 - 40 '19     21     21      '$ ALT 10 - 40 U/L '19     21     23         '$ This result is from an external source.     No results found for: "CEA1", "CEA" / No results found for: "CEA1", "CEA" No results found for: "PSA1" No results found for: "TML465" No results found for: "CAN125"  No results found for: "TOTALPROTELP", "ALBUMINELP", "A1GS", "A2GS", "BETS", "BETA2SER", "GAMS", "MSPIKE", "SPEI" Lab Results  Component Value Date   TIBC 267 12/31/2021   TIBC 291 09/15/2021   TIBC 341 06/04/2021   FERRITIN 248 12/31/2021   FERRITIN 48 09/15/2021   FERRITIN 53 06/04/2021   IRONPCTSAT 22 12/31/2021   IRONPCTSAT 13 (L) 09/15/2021   IRONPCTSAT 11 (L) 06/04/2021   Lab Results  Component Value Date   LDH 166 08/03/2020    STUDIES:  No results found.    HISTORY:   Past Medical History:  Diagnosis Date   B12 deficiency    Cancer of the skin, 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   CHF (congestive heart failure) ([facility])    Diabetes mellitus without complication ([place])    History of prostate cancer    Hyperlipidemia    Hypertension    Hypoglycemia 05/06/2021   Iron deficiency anemia 02/20/2020   Leucocytosis    Leukemia, lymphocytic, chronic ([facility])     Past Surgical History:  Procedure Laterality Date   PROSTATE BIOPSY      Family History  Problem Relation Age of Onset   Ovarian cancer Mother 9   Cancer Sister 10    Social History:  reports that he has quit smoking. He has never used smokeless tobacco. He reports that he does not currently use alcohol. He reports that he does not use drugs.The patient is accompanied by his daughter today.  Allergies: No Known Allergies  Current Medications: Current Outpatient Medications  Medication Sig Dispense Refill   apixaban (ELIQUIS) 5 MG TABS tablet Take 1 tablet by mouth 2 (two) times  daily.     acalabrutinib maleate (CALQUENCE) 100 MG tablet Take 100 mg by mouth 2 (two) times daily. (Patient not taking: Reported on 02/10/2022) 60 tablet 2   amiodarone (PACERONE) 200 MG tablet Take 200 mg by mouth daily.     Ascorbic Acid (VITAMIN C) 500 MG CAPS Take 500 mg by mouth daily.     benazepril (LOTENSIN) 20 MG tablet Take 20 mg by mouth daily. Taking half a tab do to low  BP per PCP, not wanting to stop meds with heart hx     bismuth subsalicylate (PEPTO BISMOL) 262 MG/15ML suspension Take 30 mLs by mouth every 6 (six) hours as needed. For constipation     cholecalciferol (VITAMIN D3) 25 MCG (1000 UNIT) tablet Take 1,000 Units by mouth daily.     Dextromethorphan-guaiFENesin (CORICIDIN HBP CONGESTION/COUGH PO) Take 1 tablet by mouth every 12 (twelve) hours as needed (cold symptoms).     ELIQUIS 5 MG TABS tablet Take 5 mg by mouth 2 (two) times daily.     furosemide (LASIX) 40 MG tablet Take 1 tablet by mouth daily.     ibuprofen (ADVIL) 200 MG tablet Take 200 mg by mouth daily as needed for mild pain.     ketoconazole (NIZORAL) 2 % shampoo Apply topically 3 (three) times a week.     levothyroxine (SYNTHROID) 75 MCG tablet Take 1 tablet by mouth daily.     loperamide (IMODIUM) 2 MG capsule Take 2 capsules by mouth as needed. After 1st loose BM per day. Then 1 cap after each loose stool thereafter up to 8 per day.     metFORMIN (GLUCOPHAGE-XR) 500 MG 24 hr tablet Take 500 mg by mouth 2 (two) times daily.     neomycin-polymyxin b-dexamethasone (MAXITROL) 3.5-10000-0.1 OINT Administer 1 application to the right eye two (2) times a day.     Omega-3 Fatty Acids (FISH OIL) 1000 MG CAPS Take 1,000 mg by mouth daily.     pantoprazole (PROTONIX) 40 MG tablet Take 1 tablet (40 mg total) by mouth daily. (Patient not taking: Reported on 02/10/2022) 60 tablet 0   rOPINIRole (REQUIP) 0.5 MG tablet Take 0.5-1 mg by mouth See admin instructions. Taking one tablet at bedtime, if no relief take an  additional tablet     rosuvastatin (CRESTOR) 20 MG tablet Take 10 mg by mouth daily.     vitamin B-12 (CYANOCOBALAMIN) 500 MCG tablet Take 500 mcg by mouth daily.     Current Facility-Administered Medications  Medication Dose Route Frequency Provider Last Rate Last Admin   sodium chloride flush (NS) 0.9 % injection 10 mL  10 mL Intracatheter PRN Alahna Dunne A, PA-C   10 mL at 02/10/22 1510

## 2022-02-11 ENCOUNTER — Ambulatory Visit: Payer: Medicare Other

## 2022-02-11 ENCOUNTER — Other Ambulatory Visit (HOSPITAL_COMMUNITY): Payer: Self-pay

## 2022-02-11 ENCOUNTER — Encounter: Payer: Self-pay | Admitting: Hematology and Oncology

## 2022-02-12 ENCOUNTER — Other Ambulatory Visit: Payer: Self-pay

## 2022-02-12 LAB — T4: T4, Total: 8.3 ug/dL (ref 4.5–12.0)

## 2022-02-14 ENCOUNTER — Other Ambulatory Visit (HOSPITAL_COMMUNITY): Payer: Self-pay

## 2022-02-16 ENCOUNTER — Encounter: Payer: Self-pay | Admitting: Hematology and Oncology

## 2022-02-16 ENCOUNTER — Encounter: Payer: Self-pay | Admitting: Oncology

## 2022-02-16 ENCOUNTER — Other Ambulatory Visit (HOSPITAL_COMMUNITY): Payer: Self-pay

## 2022-02-17 ENCOUNTER — Telehealth: Payer: Self-pay

## 2022-02-17 ENCOUNTER — Inpatient Hospital Stay (HOSPITAL_COMMUNITY)
Admission: AD | Admit: 2022-02-17 | Discharge: 2022-02-25 | DRG: 871 | Disposition: A | Discharge status: Skilled Nursing Facility | Payer: Medicare Other | Source: Other Acute Inpatient Hospital | Attending: Family Medicine | Admitting: Family Medicine

## 2022-02-17 ENCOUNTER — Encounter (HOSPITAL_COMMUNITY): Payer: Self-pay

## 2022-02-17 DIAGNOSIS — E1159 Type 2 diabetes mellitus with other circulatory complications: Secondary | ICD-10-CM | POA: Diagnosis not present

## 2022-02-17 DIAGNOSIS — I6529 Occlusion and stenosis of unspecified carotid artery: Secondary | ICD-10-CM | POA: Diagnosis present

## 2022-02-17 DIAGNOSIS — E785 Hyperlipidemia, unspecified: Secondary | ICD-10-CM | POA: Diagnosis present

## 2022-02-17 DIAGNOSIS — C61 Malignant neoplasm of prostate: Secondary | ICD-10-CM | POA: Diagnosis present

## 2022-02-17 DIAGNOSIS — E119 Type 2 diabetes mellitus without complications: Secondary | ICD-10-CM | POA: Diagnosis present

## 2022-02-17 DIAGNOSIS — A040 Enteropathogenic Escherichia coli infection: Secondary | ICD-10-CM | POA: Diagnosis present

## 2022-02-17 DIAGNOSIS — E43 Unspecified severe protein-calorie malnutrition: Secondary | ICD-10-CM | POA: Diagnosis present

## 2022-02-17 DIAGNOSIS — M6281 Muscle weakness (generalized): Secondary | ICD-10-CM | POA: Diagnosis present

## 2022-02-17 DIAGNOSIS — A419 Sepsis, unspecified organism: Secondary | ICD-10-CM | POA: Diagnosis present

## 2022-02-17 DIAGNOSIS — Z8719 Personal history of other diseases of the digestive system: Secondary | ICD-10-CM

## 2022-02-17 DIAGNOSIS — I5020 Unspecified systolic (congestive) heart failure: Secondary | ICD-10-CM | POA: Diagnosis not present

## 2022-02-17 DIAGNOSIS — Z8546 Personal history of malignant neoplasm of prostate: Secondary | ICD-10-CM

## 2022-02-17 DIAGNOSIS — D509 Iron deficiency anemia, unspecified: Secondary | ICD-10-CM | POA: Diagnosis present

## 2022-02-17 DIAGNOSIS — I959 Hypotension, unspecified: Secondary | ICD-10-CM | POA: Diagnosis present

## 2022-02-17 DIAGNOSIS — I5042 Chronic combined systolic (congestive) and diastolic (congestive) heart failure: Secondary | ICD-10-CM | POA: Diagnosis present

## 2022-02-17 DIAGNOSIS — C4440 Unspecified malignant neoplasm of skin of scalp and neck: Secondary | ICD-10-CM | POA: Diagnosis present

## 2022-02-17 DIAGNOSIS — D631 Anemia in chronic kidney disease: Secondary | ICD-10-CM | POA: Diagnosis present

## 2022-02-17 DIAGNOSIS — Z79890 Hormone replacement therapy (postmenopausal): Secondary | ICD-10-CM

## 2022-02-17 DIAGNOSIS — D508 Other iron deficiency anemias: Secondary | ICD-10-CM | POA: Diagnosis not present

## 2022-02-17 DIAGNOSIS — Z66 Do not resuscitate: Secondary | ICD-10-CM | POA: Diagnosis present

## 2022-02-17 DIAGNOSIS — Z6822 Body mass index (BMI) 22.0-22.9, adult: Secondary | ICD-10-CM

## 2022-02-17 DIAGNOSIS — C9110 Chronic lymphocytic leukemia of B-cell type not having achieved remission: Secondary | ICD-10-CM | POA: Diagnosis present

## 2022-02-17 DIAGNOSIS — R7881 Bacteremia: Secondary | ICD-10-CM | POA: Diagnosis not present

## 2022-02-17 DIAGNOSIS — E1122 Type 2 diabetes mellitus with diabetic chronic kidney disease: Secondary | ICD-10-CM | POA: Diagnosis present

## 2022-02-17 DIAGNOSIS — E861 Hypovolemia: Secondary | ICD-10-CM | POA: Diagnosis present

## 2022-02-17 DIAGNOSIS — E039 Hypothyroidism, unspecified: Secondary | ICD-10-CM | POA: Diagnosis present

## 2022-02-17 DIAGNOSIS — A4151 Sepsis due to Escherichia coli [E. coli]: Secondary | ICD-10-CM | POA: Diagnosis present

## 2022-02-17 DIAGNOSIS — R5081 Fever presenting with conditions classified elsewhere: Secondary | ICD-10-CM | POA: Diagnosis present

## 2022-02-17 DIAGNOSIS — D709 Neutropenia, unspecified: Secondary | ICD-10-CM | POA: Diagnosis present

## 2022-02-17 DIAGNOSIS — B9620 Unspecified Escherichia coli [E. coli] as the cause of diseases classified elsewhere: Secondary | ICD-10-CM | POA: Diagnosis not present

## 2022-02-17 DIAGNOSIS — Z7969 Long term (current) use of other immunomodulators and immunosuppressants: Secondary | ICD-10-CM

## 2022-02-17 DIAGNOSIS — I2510 Atherosclerotic heart disease of native coronary artery without angina pectoris: Secondary | ICD-10-CM | POA: Diagnosis present

## 2022-02-17 DIAGNOSIS — N189 Chronic kidney disease, unspecified: Secondary | ICD-10-CM | POA: Diagnosis present

## 2022-02-17 DIAGNOSIS — G2581 Restless legs syndrome: Secondary | ICD-10-CM | POA: Diagnosis present

## 2022-02-17 DIAGNOSIS — I130 Hypertensive heart and chronic kidney disease with heart failure and stage 1 through stage 4 chronic kidney disease, or unspecified chronic kidney disease: Secondary | ICD-10-CM | POA: Diagnosis present

## 2022-02-17 DIAGNOSIS — Z87891 Personal history of nicotine dependence: Secondary | ICD-10-CM

## 2022-02-17 DIAGNOSIS — I9589 Other hypotension: Secondary | ICD-10-CM | POA: Diagnosis not present

## 2022-02-17 DIAGNOSIS — I10 Essential (primary) hypertension: Secondary | ICD-10-CM | POA: Diagnosis present

## 2022-02-17 DIAGNOSIS — I480 Paroxysmal atrial fibrillation: Secondary | ICD-10-CM | POA: Diagnosis present

## 2022-02-17 DIAGNOSIS — N400 Enlarged prostate without lower urinary tract symptoms: Secondary | ICD-10-CM | POA: Diagnosis present

## 2022-02-17 DIAGNOSIS — Z7901 Long term (current) use of anticoagulants: Secondary | ICD-10-CM

## 2022-02-17 DIAGNOSIS — J9811 Atelectasis: Secondary | ICD-10-CM | POA: Diagnosis present

## 2022-02-17 DIAGNOSIS — Z7984 Long term (current) use of oral hypoglycemic drugs: Secondary | ICD-10-CM

## 2022-02-17 DIAGNOSIS — Z79899 Other long term (current) drug therapy: Secondary | ICD-10-CM

## 2022-02-17 DIAGNOSIS — Z86010 Personal history of colonic polyps: Secondary | ICD-10-CM

## 2022-02-17 DIAGNOSIS — Z8041 Family history of malignant neoplasm of ovary: Secondary | ICD-10-CM

## 2022-02-17 MED ORDER — ROPINIROLE HCL 1 MG PO TABS
0.5000 mg | ORAL_TABLET | Freq: Every evening | ORAL | Status: DC | PRN
  Administered 2022-02-23 (×2): 0.5 mg via ORAL
  Filled 2022-02-17 (×2): qty 1

## 2022-02-17 MED ORDER — POLYETHYLENE GLYCOL 3350 17 G PO PACK
17.0000 g | PACK | Freq: Every day | ORAL | Status: DC | PRN
  Administered 2022-02-18 – 2022-02-20 (×3): 17 g via ORAL
  Filled 2022-02-17 (×2): qty 1

## 2022-02-17 MED ORDER — ACETAMINOPHEN 650 MG RE SUPP: 650.0000 mg | Freq: Four times a day (QID) | RECTAL | Status: DC | PRN

## 2022-02-17 MED ORDER — LEVOTHYROXINE SODIUM 50 MCG PO TABS
75.0000 ug | ORAL_TABLET | Freq: Every day | ORAL | Status: DC
  Administered 2022-02-18 – 2022-02-25 (×8): 75 ug via ORAL
  Filled 2022-02-17 (×8): qty 1

## 2022-02-17 MED ORDER — APIXABAN 5 MG PO TABS: 5.0000 mg | ORAL_TABLET | Freq: Two times a day (BID) | ORAL | Status: DC

## 2022-02-17 MED ORDER — APIXABAN 5 MG PO TABS
5.0000 mg | ORAL_TABLET | Freq: Two times a day (BID) | ORAL | Status: DC
  Administered 2022-02-18 – 2022-02-25 (×15): 5 mg via ORAL
  Filled 2022-02-17 (×15): qty 1

## 2022-02-17 MED ORDER — PANTOPRAZOLE SODIUM 40 MG PO TBEC
40.0000 mg | DELAYED_RELEASE_TABLET | Freq: Every day | ORAL | Status: DC
  Administered 2022-02-18 – 2022-02-25 (×8): 40 mg via ORAL
  Filled 2022-02-17 (×8): qty 1

## 2022-02-17 MED ORDER — ACETAMINOPHEN 325 MG PO TABS: 650.0000 mg | ORAL_TABLET | Freq: Four times a day (QID) | ORAL | Status: DC | PRN

## 2022-02-17 MED ORDER — AMIODARONE HCL 200 MG PO TABS
200.0000 mg | ORAL_TABLET | Freq: Every day | ORAL | Status: DC
  Administered 2022-02-18 – 2022-02-25 (×8): 200 mg via ORAL
  Filled 2022-02-17 (×8): qty 1

## 2022-02-17 MED ORDER — ROPINIROLE HCL 1 MG PO TABS
0.5000 mg | ORAL_TABLET | Freq: Every day | ORAL | Status: DC
  Administered 2022-02-18 – 2022-02-24 (×8): 0.5 mg via ORAL
  Filled 2022-02-17 (×8): qty 1

## 2022-02-17 MED ORDER — SODIUM CHLORIDE 0.9% FLUSH
3.0000 mL | Freq: Two times a day (BID) | INTRAVENOUS | Status: DC
  Administered 2022-02-18 – 2022-02-24 (×12): 3 mL via INTRAVENOUS

## 2022-02-17 MED ORDER — INSULIN ASPART 100 UNIT/ML IJ SOLN
0.0000 [IU] | Freq: Three times a day (TID) | INTRAMUSCULAR | Status: DC
  Administered 2022-02-18: 2 [IU] via SUBCUTANEOUS
  Administered 2022-02-18: 5 [IU] via SUBCUTANEOUS
  Administered 2022-02-19: 1 [IU] via SUBCUTANEOUS
  Administered 2022-02-20 – 2022-02-21 (×2): 3 [IU] via SUBCUTANEOUS
  Administered 2022-02-22 (×2): 1 [IU] via SUBCUTANEOUS
  Administered 2022-02-23 (×2): 2 [IU] via SUBCUTANEOUS
  Administered 2022-02-24: 3 [IU] via SUBCUTANEOUS
  Administered 2022-02-25: 1 [IU] via SUBCUTANEOUS

## 2022-02-17 MED ORDER — ROSUVASTATIN CALCIUM 10 MG PO TABS
10.0000 mg | ORAL_TABLET | Freq: Every day | ORAL | Status: DC
  Administered 2022-02-18 – 2022-02-25 (×8): 10 mg via ORAL
  Filled 2022-02-17 (×8): qty 1

## 2022-02-17 MED ORDER — ONDANSETRON HCL 4 MG PO TABS: 4.0000 mg | ORAL_TABLET | Freq: Four times a day (QID) | ORAL | Status: DC | PRN

## 2022-02-17 MED ORDER — ONDANSETRON HCL 4 MG/2ML IJ SOLN: 4.0000 mg | Freq: Four times a day (QID) | INTRAMUSCULAR | Status: DC | PRN

## 2022-02-17 NOTE — Progress Notes (Signed)
Pharmacy Antibiotic Note  Fernando Young is a 81 y.o. male admitted on 02/17/2022 with medical history significant of CLL, squamous cell carcinoma, CHF, paroxysmal atrial fibrillation, and BPH, hypertension, GI bleed, diabetes, hypothyroidism, carotid artery disease, RLS, prostate cancer presenting with neutropenic fever. .  Pharmacy has been consulted for vancomycin and cefepime dosing.  1st doses given at outside hospital.  Plan: Vancomycin 1gm IV q24h (AUC 519.1, Scr 1.2) TBW) Cefepime 2gm IV q12h Follow renal function, cultures and clinical course    No data recorded.  No results for input(s): "WBC", "CREATININE", "LATICACIDVEN", "VANCOTROUGH", "VANCOPEAK", "VANCORANDOM", "GENTTROUGH", "GENTPEAK", "GENTRANDOM", "TOBRATROUGH", "TOBRAPEAK", "TOBRARND", "AMIKACINPEAK", "AMIKACINTROU", "AMIKACIN" in the last 168 hours.  Estimated Creatinine Clearance: 43.5 mL/min (by C-G formula based on SCr of 1.2 mg/dL).    No Known Allergies  Antimicrobials this admission: 10/19 vanc >> 10/19 cefepime >>  Dose adjustments this admission:   Microbiology results:   Thank you for allowing pharmacy to be a part of this patient's care. Dolly Rias RPh 02/18/2022, 12:02 AM

## 2022-02-17 NOTE — H&P (Signed)
History and Physical   Fernando Young ZDG:[id number] DOB: 1940/08/22 DOA: 02/17/2022  PCP: Raelene Bott, MD   Patient coming from: [facility]  Chief Complaint: Neutropenic fever  HPI: Fernando Young is a 81 y.o. male with medical history significant of CLL, squamous cell carcinoma, CHF, paroxysmal atrial fibrillation, and BPH, hypertension, GI bleed, diabetes, hypothyroidism, carotid artery disease, RLS, prostate cancer presenting with neutropenic fever.  Patient reports ongoing issues with chemotherapy which is currently being held.  He was recently treated for enteropathic E. coli in the past month with improvement after admission.  He is began to feel worse recently and noted some black stools which has progressed in the last several days to diarrhea with a coffee-ground appearance.  He has had decreased p.o. intake due to fear of recurrent diarrhea and has had subsequent worsening weakness.  Also reports some congestion.  At home he has had recorded fevers as high as 100.8.  He denies chills, chest pain, shortness of breath, abdominal pain, constipation, nausea, vomiting.  ED Course: Vital signs at [facility] ED were significant for initial blood pressure in the 95J systolics but here seem to have improved to the 120s on first check with 500 cc given there.  Lab work-up there showed CMP with BUN 33, glucose 177, protein 5.9, albumin 3.3.  CBC showed chronic leukocytosis at 17.9, hemoglobin stable at 9.6, ANC notably 0.1.  Lactic acid normal.  Flu COVID RSV screen negative.  Urinalysis with protein and RBCs only.  GI pathogen panel, C. difficile panel, urine culture, blood culture, type and screen pending.  Chest x-ray showed increasing left lower lobe atelectasis versus airspace disease.  Patient received cefepime, Flagyl, 500 cc IV fluids.  Transfer requested to facility with oncology available.  During his course of his work-up oncology had been consulted and recommending vancomycin and  cefepime as above.  Review of Systems: As per HPI otherwise all other systems reviewed and are negative.  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 He has never used smokeless tobacco. He reports that he does not currently use alcohol. He reports that he does not use drugs.  No Known Allergies  Family History  Problem Relation Age of Onset   Ovarian cancer Mother 67   Cancer Sister 64  Reviewed on admission  Prior to Admission medications   Medication Sig Start Date End Date Taking? Authorizing Provider  acalabrutinib maleate (CALQUENCE) 100 MG tablet Take 100 mg by mouth 2 (two) times daily. Patient not taking: Reported on 02/10/2022 11/17/21   Rosanne Sack A, PA-C  amiodarone (PACERONE) 200 MG tablet Take 200 mg by mouth daily. 03/10/20   [provider]  apixaban (ELIQUIS) 5 MG TABS tablet Take 1 tablet by mouth 2 (two) times daily. 02/09/22   [provider]  Ascorbic Acid (VITAMIN C) 500 MG CAPS Take 500 mg by mouth daily.    [provider]  benazepril (LOTENSIN) 20 MG tablet Take 20  mg by mouth daily. Taking half a tab do to low BP per PCP, not wanting to stop meds with heart hx    [provider]  bismuth subsalicylate (PEPTO BISMOL) 262 MG/15ML suspension Take 30 mLs by mouth every 6 (six) hours as needed. For constipation 01/05/22   [provider]  cholecalciferol (VITAMIN D3) 25 MCG (1000 UNIT) tablet Take 1,000 Units by mouth daily.    [provider]   Dextromethorphan-guaiFENesin (CORICIDIN HBP CONGESTION/COUGH PO) Take 1 tablet by mouth every 12 (twelve) hours as needed (cold symptoms).    [provider]  ELIQUIS 5 MG TABS tablet Take 5 mg by mouth 2 (two) times daily. 04/13/21   [provider]  furosemide (LASIX) 40 MG tablet Take 1 tablet by mouth daily. 11/23/21   [provider]  ibuprofen (ADVIL) 200 MG tablet Take 200 mg by mouth daily as needed for mild pain.    [provider]  ketoconazole (NIZORAL) 2 % shampoo Apply topically 3 (three) times a week. 02/03/21   [provider]  levothyroxine (SYNTHROID) 75 MCG tablet Take 1 tablet by mouth daily. 12/06/21   [provider]  loperamide (IMODIUM) 2 MG capsule Take 2 capsules by mouth as needed. After 1st loose BM per day. Then 1 cap after each loose stool thereafter up to 8 per day. 01/10/22   [provider]  metFORMIN (GLUCOPHAGE-XR) 500 MG 24 hr tablet Take 500 mg by mouth 2 (two) times daily. 12/03/20   [provider]  neomycin-polymyxin b-dexamethasone (MAXITROL) 3.5-10000-0.1 OINT Administer 1 application to the right eye two (2) times a day. 02/24/20   [provider]  Omega-3 Fatty Acids (FISH OIL) 1000 MG CAPS Take 1,000 mg by mouth daily.    [provider]  pantoprazole (PROTONIX) 40 MG tablet Take 1 tablet (40 mg total) by mouth daily. Patient not taking: Reported on 02/10/2022 01/14/22   Rosanne Sack A, PA-C  rOPINIRole (REQUIP) 0.5 MG tablet Take 0.5-1 mg by mouth See admin instructions. Taking one tablet at bedtime, if no relief take an additional tablet    [provider]  rosuvastatin (CRESTOR) 20 MG tablet Take 10 mg by mouth daily. 02/04/21   [provider]  vitamin B-12 (CYANOCOBALAMIN) 500 MCG tablet Take 500 mcg by mouth daily.    [provider]    Physical Exam: There were no vitals filed for this visit.  Physical Exam Constitutional:       General: He is not in acute distress.    Comments: Tired appearing, thin elderly male.  HENT:     Head: Normocephalic and atraumatic.     Mouth/Throat:     Mouth: Mucous membranes are moist.     Pharynx: Oropharynx is clear.  Eyes:     Extraocular Movements: Extraocular movements intact.     Pupils: Pupils are equal, round, and reactive to light.  Cardiovascular:     Rate and Rhythm: Normal rate and regular rhythm.     Pulses: Normal pulses.     Heart sounds: Normal heart sounds.  Pulmonary:     Effort: Pulmonary effort is normal. No respiratory distress.     Breath sounds: Normal breath sounds.  Abdominal:     General: Bowel sounds are normal. There is no distension.     Palpations: Abdomen is soft.     Tenderness: There is no abdominal tenderness.  Musculoskeletal:        General: No swelling or deformity.  Skin:    General: Skin is warm and dry.     Comments: Chronic skin changes related to his skin cancer  Neurological:     General: No focal deficit present.     Mental Status: Mental status is at baseline.    Labs on Admission: I have personally reviewed following labs and imaging studies  CBC: No results for input(s): "WBC", "NEUTROABS", "HGB", "HCT", "MCV", "PLT" in the last 168 hours.  Basic Metabolic Panel: No results for input(s): "NA", "K", "CL", "CO2", "GLUCOSE", "BUN", "CREATININE", "CALCIUM", "MG", "PHOS" in the last 168 hours.  GFR: Estimated Creatinine Clearance: 43.5 mL/min (by C-G formula based on SCr of 1.2 mg/dL).  Liver Function Tests: No results for input(s): "AST", "ALT", "ALKPHOS", "BILITOT", "PROT", "ALBUMIN" in the last 168 hours.  Urine analysis: No results found for: "COLORURINE", "APPEARANCEUR", "LABSPEC", "PHURINE", "GLUCOSEU", "HGBUR", "BILIRUBINUR", "KETONESUR", "PROTEINUR", "UROBILINOGEN", "NITRITE", "LEUKOCYTESUR"  Radiological Exams on Admission: No results found.  EKG: Does not appear to have been performed at outside  ED.  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  Also with congestion and fever as high as 100.8 at home.  Noted to have neutropenia with ANC of 0.1. > In the setting of CLL as below. > Started vancomycin and cefepime on advice of oncology in ED at outside hospital. > Initially some hypotension but this appears to have improved with 500 cc bolus. > Negative for flu COVID and RSV at outside hospital.  They have ordered C. difficile and GI pathogen panel which are pending due to his diarrhea.  Urine cultures and blood cultures are pending.  Chest x-ray also stated there was some increased atelectasis versus airspace disease of the left lower lobe. - Oncology has agreed to see patient when consulted prior to arrival.  I have added the provider who discussed the patient to his treatment team, however they may need to be notified of arrival tomorrow. - Continue to monitor on stepdown unit for now - Continue vancomycin and cefepime - Trend fever curve and WBC/ANC - Follow-up outside hospital urine culture and blood cultures - Follow-up outside hospital GI pathogen panel and C. difficile studies  CLL Squama cell carcinoma > History of squamous cell carcinoma had been on Erivedge but developed additional lesions and was started on,Comiplimab, but this was held due to worsening fatigue. > History of CLL, treatment initially held due to comorbidities and that he was started on acalabrutinib, however he  developed GI bleeding this has been held. > Does follow with oncology in [place], [place] [facility]. - Oncology is aware of patient and will see patient while admitted - Continue to monitor - Supportive care  CHF > Known history of CHF last echo was earlier this month with EF of 35%, G1 DD, normal RV function. - Holding home Lasix in the setting of hypotension during initial evaluation in the ED - Avoid excessive IV fluids - Trend renal function electrolytes  Paroxysmal atrial fibrillation - Continue home amiodarone and Eliquis  Hypertension > Initially hypotensive in  the ED with blood pressures reportedly as low as the 88Q systolic however this improved significantly with 500 cc bolus.  Outpatient blood pressures appear to be in the 80s to 100s in the past couple weeks per chart review. - Holding home Lasix and benazepril for now  History of GI bleed Dark stools > No history of GI bleed this was a complication of chemotherapy in the past.  Family had some concerns about recurrent dark stools however hemoglobin remained stable. - With stable hemoglobin we will continue with his home Eliquis, but will start first dose in the morning - We will continue with PPI - Trend CBC  Diabetes - SSI  Hypothyroidism - Continue home Synthroid  RLS - Continue home ropinirole  Carotid artery disease - Continue home rosuvastatin - On Eliquis as above  History of BPH and prostate cancer - Noted  DVT prophylaxis: Eliquis Code Status:   DNR Family Communication:  Family updated by phone, spoke with daughter Aundra Millet. Disposition Plan:   Patient is from:  [facility]  Anticipated DC to:  Home  Anticipated DC date:  2 to 4 days  Anticipated DC barriers: None  Consults called:  I have added the provider who discussed the patient to his treatment team, however they may need to be notified of arrival tomorrow. Admission status:  Inpatient, stepdown  Severity of Illness: The  appropriate patient status for this patient is INPATIENT. Inpatient status is judged to be reasonable and necessary in order to provide the required intensity of service to ensure the patient's safety. The patient's presenting symptoms, physical exam findings, and initial radiographic and laboratory data in the context of their chronic comorbidities is felt to place them at high risk for further clinical deterioration. Furthermore, it is not anticipated that the patient will be medically stable for discharge from the hospital within 2 midnights of admission.   * I certify that at the point of admission it is my clinical judgment that the patient will require inpatient hospital care spanning beyond 2 midnights from the point of admission due to high intensity of service, high risk for further deterioration and high frequency of surveillance required.Marcelyn Bruins MD [facility]  How to contact the [facility] Attending or Consulting provider [place] or covering provider during after hours [place], for this patient?   Check the care team in [facility] and look for a) attending/consulting [facility] provider listed and b) the [facility] team listed Log into [email or web address] and use [place]'s universal password to access. If you do not have the password, please contact the hospital operator. Locate the [facility] provider you are looking for under [facility] and page to a number that you can be directly reached. If you still have difficulty reaching the provider, please page the [facility] (Director on Call) for the Hospitalists listed on amion for assistance.  02/17/2022, 10:59 PM

## 2022-02-17 NOTE — Telephone Encounter (Signed)
Daughter Fernando Young called stating that patient has went down hill since last visit. Stool has blood in it and looks like coffee grinds. Fever and chills. Per talking with Amy, RN in triage. Advised patient to go to Emergency Department. Patient agreed to go.

## 2022-02-18 ENCOUNTER — Encounter (HOSPITAL_COMMUNITY): Payer: Self-pay | Admitting: Internal Medicine

## 2022-02-18 ENCOUNTER — Other Ambulatory Visit: Payer: Self-pay

## 2022-02-18 ENCOUNTER — Inpatient Hospital Stay (HOSPITAL_COMMUNITY): Payer: Medicare Other

## 2022-02-18 DIAGNOSIS — I9589 Other hypotension: Secondary | ICD-10-CM

## 2022-02-18 DIAGNOSIS — R7881 Bacteremia: Secondary | ICD-10-CM

## 2022-02-18 DIAGNOSIS — C9110 Chronic lymphocytic leukemia of B-cell type not having achieved remission: Secondary | ICD-10-CM | POA: Diagnosis not present

## 2022-02-18 DIAGNOSIS — R5081 Fever presenting with conditions classified elsewhere: Secondary | ICD-10-CM | POA: Diagnosis not present

## 2022-02-18 DIAGNOSIS — I10 Essential (primary) hypertension: Secondary | ICD-10-CM | POA: Diagnosis not present

## 2022-02-18 DIAGNOSIS — D709 Neutropenia, unspecified: Secondary | ICD-10-CM | POA: Diagnosis not present

## 2022-02-18 DIAGNOSIS — E039 Hypothyroidism, unspecified: Secondary | ICD-10-CM | POA: Diagnosis not present

## 2022-02-18 DIAGNOSIS — E43 Unspecified severe protein-calorie malnutrition: Secondary | ICD-10-CM | POA: Insufficient documentation

## 2022-02-18 DIAGNOSIS — E861 Hypovolemia: Secondary | ICD-10-CM

## 2022-02-18 LAB — URINALYSIS, COMPLETE (UACMP) WITH MICROSCOPIC
Bilirubin Urine: NEGATIVE
Glucose, UA: NEGATIVE mg/dL
Hgb urine dipstick: NEGATIVE
Ketones, ur: NEGATIVE mg/dL
Leukocytes,Ua: NEGATIVE
Nitrite: NEGATIVE
Protein, ur: NEGATIVE mg/dL
Specific Gravity, Urine: 1.017 (ref 1.005–1.030)
pH: 5 (ref 5.0–8.0)

## 2022-02-18 LAB — CBC WITH DIFFERENTIAL/PLATELET
Abs Immature Granulocytes: 0.09 10*3/uL — ABNORMAL HIGH (ref 0.00–0.07)
Basophils Absolute: 0.1 10*3/uL (ref 0.0–0.1)
Basophils Relative: 0 %
Eosinophils Absolute: 0 10*3/uL (ref 0.0–0.5)
Eosinophils Relative: 0 %
HCT: 28 % — ABNORMAL LOW (ref 39.0–52.0)
Hemoglobin: 8.7 g/dL — ABNORMAL LOW (ref 13.0–17.0)
Immature Granulocytes: 0 %
Lymphocytes Relative: 94 %
Lymphs Abs: 19.9 10*3/uL — ABNORMAL HIGH (ref 0.7–4.0)
MCH: 29.9 pg (ref 26.0–34.0)
MCHC: 31.1 g/dL (ref 30.0–36.0)
MCV: 96.2 fL (ref 80.0–100.0)
Monocytes Absolute: 0.9 10*3/uL (ref 0.1–1.0)
Monocytes Relative: 4 %
Neutro Abs: 0.3 10*3/uL — CL (ref 1.7–7.7)
Neutrophils Relative %: 2 %
Platelets: 196 10*3/uL (ref 150–400)
RBC: 2.91 MIL/uL — ABNORMAL LOW (ref 4.22–5.81)
RDW: 14.1 % (ref 11.5–15.5)
WBC Morphology: ABNORMAL
WBC: 21.3 10*3/uL — ABNORMAL HIGH (ref 4.0–10.5)
nRBC: 0 % (ref 0.0–0.2)

## 2022-02-18 LAB — COMPREHENSIVE METABOLIC PANEL
ALT: 34 U/L (ref 0–44)
AST: 29 U/L (ref 15–41)
Albumin: 2.5 g/dL — ABNORMAL LOW (ref 3.5–5.0)
Alkaline Phosphatase: 39 U/L (ref 38–126)
Anion gap: 7 (ref 5–15)
BUN: 28 mg/dL — ABNORMAL HIGH (ref 8–23)
CO2: 22 mmol/L (ref 22–32)
Calcium: 8.3 mg/dL — ABNORMAL LOW (ref 8.9–10.3)
Chloride: 109 mmol/L (ref 98–111)
Creatinine, Ser: 0.97 mg/dL (ref 0.61–1.24)
GFR, Estimated: >60 mL/min (ref >60)
Glucose, Bld: 108 mg/dL — ABNORMAL HIGH (ref 70–99)
Potassium: 4.5 mmol/L (ref 3.5–5.1)
Sodium: 138 mmol/L (ref 135–145)
Total Bilirubin: 0.6 mg/dL (ref 0.3–1.2)
Total Protein: 5 g/dL — ABNORMAL LOW (ref 6.5–8.1)

## 2022-02-18 LAB — CBC
HCT: 29.2 % — ABNORMAL LOW (ref 39.0–52.0)
HCT: 30.2 % — ABNORMAL LOW (ref 39.0–52.0)
Hemoglobin: 8.9 g/dL — ABNORMAL LOW (ref 13.0–17.0)
Hemoglobin: 9.2 g/dL — ABNORMAL LOW (ref 13.0–17.0)
MCH: 30.1 pg (ref 26.0–34.0)
MCH: 30.4 pg (ref 26.0–34.0)
MCHC: 30.5 g/dL (ref 30.0–36.0)
MCHC: 30.5 g/dL (ref 30.0–36.0)
MCV: 98.6 fL (ref 80.0–100.0)
MCV: 99.7 fL (ref 80.0–100.0)
Platelets: 190 10*3/uL (ref 150–400)
Platelets: 200 10*3/uL (ref 150–400)
RBC: 2.96 MIL/uL — ABNORMAL LOW (ref 4.22–5.81)
RBC: 3.03 MIL/uL — ABNORMAL LOW (ref 4.22–5.81)
RDW: 14.3 % (ref 11.5–15.5)
RDW: 14.4 % (ref 11.5–15.5)
WBC: 19.3 10*3/uL — ABNORMAL HIGH (ref 4.0–10.5)
WBC: 20.5 10*3/uL — ABNORMAL HIGH (ref 4.0–10.5)
nRBC: 0 % (ref 0.0–0.2)
nRBC: 0 % (ref 0.0–0.2)

## 2022-02-18 LAB — GLUCOSE, CAPILLARY
Glucose-Capillary: 100 mg/dL — ABNORMAL HIGH (ref 70–99)
Glucose-Capillary: 151 mg/dL — ABNORMAL HIGH (ref 70–99)
Glucose-Capillary: 268 mg/dL — ABNORMAL HIGH (ref 70–99)

## 2022-02-18 LAB — TYPE AND SCREEN
ABO/RH(D): A POS
Antibody Screen: NEGATIVE

## 2022-02-18 LAB — CORTISOL: Cortisol, Plasma: 22 ug/dL

## 2022-02-18 MED ORDER — TBO-FILGRASTIM 300 MCG/0.5ML ~~LOC~~ SOSY
300.0000 ug | PREFILLED_SYRINGE | Freq: Every day | SUBCUTANEOUS | Status: DC
  Filled 2022-02-18: qty 0.5

## 2022-02-18 MED ORDER — VANCOMYCIN HCL IN DEXTROSE 1-5 GM/200ML-% IV SOLN: 1000.0000 mg | INTRAVENOUS | Status: DC

## 2022-02-18 MED ORDER — HEPARIN SOD (PORK) LOCK FLUSH 100 UNIT/ML IV SOLN: 500.0000 [IU] | Freq: Every day | INTRAVENOUS | Status: DC | PRN

## 2022-02-18 MED ORDER — SODIUM CHLORIDE 0.9 % IV SOLN
2.0000 g | INTRAVENOUS | Status: DC
  Administered 2022-02-18 – 2022-02-24 (×7): 2 g via INTRAVENOUS
  Filled 2022-02-18 (×8): qty 20

## 2022-02-18 MED ORDER — SODIUM CHLORIDE 0.9 % IV BOLUS
500.0000 mL | Freq: Once | INTRAVENOUS | Status: AC
  Administered 2022-02-18: 500 mL via INTRAVENOUS

## 2022-02-18 MED ORDER — HEPARIN SOD (PORK) LOCK FLUSH 100 UNIT/ML IV SOLN: 250.0000 [IU] | INTRAVENOUS | Status: DC | PRN

## 2022-02-18 MED ORDER — SODIUM CHLORIDE 0.9 % IV BOLUS
250.0000 mL | Freq: Once | INTRAVENOUS | Status: AC
  Administered 2022-02-18: 250 mL via INTRAVENOUS

## 2022-02-18 MED ORDER — SODIUM CHLORIDE 0.9% FLUSH
3.0000 mL | INTRAVENOUS | Status: AC | PRN
  Administered 2022-02-25: 3 mL

## 2022-02-18 MED ORDER — SODIUM CHLORIDE 0.9 % IV SOLN
2.0000 g | Freq: Two times a day (BID) | INTRAVENOUS | Status: DC
  Administered 2022-02-18: 2 g via INTRAVENOUS
  Filled 2022-02-18: qty 12.5

## 2022-02-18 MED ORDER — SODIUM CHLORIDE 0.9% FLUSH: 3.0000 mL | INTRAVENOUS | Status: DC | PRN

## 2022-02-18 MED ORDER — MIDODRINE HCL 5 MG PO TABS
5.0000 mg | ORAL_TABLET | Freq: Three times a day (TID) | ORAL | Status: DC
  Administered 2022-02-18 – 2022-02-25 (×22): 5 mg via ORAL
  Filled 2022-02-18 (×22): qty 1

## 2022-02-18 MED ORDER — TBO-FILGRASTIM 300 MCG/0.5ML ~~LOC~~ SOSY
300.0000 ug | PREFILLED_SYRINGE | Freq: Every day | SUBCUTANEOUS | Status: DC
  Administered 2022-02-18: 300 ug via SUBCUTANEOUS
  Filled 2022-02-18: qty 0.5

## 2022-02-18 MED ORDER — VANCOMYCIN HCL 1250 MG/250ML IV SOLN: 1250.0000 mg | INTRAVENOUS | Status: DC

## 2022-02-18 MED ORDER — CHLORHEXIDINE GLUCONATE CLOTH 2 % EX PADS
6.0000 | MEDICATED_PAD | Freq: Every day | CUTANEOUS | Status: DC
  Administered 2022-02-18 – 2022-02-24 (×6): 6 via TOPICAL

## 2022-02-18 NOTE — Consult Note (Signed)
[place] CONSULT NOTE  Patient Care Team: Raelene Bott, MD as PCP - General (Internal Medicine)  CHIEF COMPLAINTS/PURPOSE OF CONSULTATION:  Newly diagnosed breast cancer  HISTORY OF PRESENTING ILLNESS:  Fernando Young 81 y.o. male is here because of CLL and squamous cell carcinoma.  Currently on treatment with acalabrutinib for CLL.  Treatment on hold.  Admitted to the hospital with neutropenic fever and was started on broad-spectrum antibiotics. For his CLL he was started on acalabrutinib on July 21.  His white count has responded amazingly with the rapid decline in his white count from 200,000 down to 20,000.  However because of neutropenia and fever he was brought into emergency room and was transferred to [facility] for further management. I spoke with his primary oncologist Dr. Hinton Rao and also with his daughter. I reviewed her records extensively and collaborated the history with the patient.  SUMMARY OF ONCOLOGIC HISTORY: Oncology History  CLL (chronic lymphocytic leukemia) ([place])  05/25/2015 Initial Diagnosis   CLL (chronic lymphocytic leukemia) ([place])   02/04/2021 Cancer Staging   Staging form: Chronic Lymphocytic Leukemia / Small Lymphocytic Lymphoma, AJCC 8th Edition - Clinical stage from 02/04/2021: Modified Rai Stage III (Modified Rai risk: High, Binet: Stage A, Lymphocytosis: Present, Adenopathy: Absent, Organomegaly: Absent, Anemia: Present, Thrombocytopenia: Absent) - Signed by Derwood Kaplan, MD on 02/08/2021 Stage prefix: Recurrence Absolute lymphocyte count (ALC) (cells/uL): 100 Hemoglobin (Hgb) (g/dL): 11.4 Platelet count (x10E9/L of blood): 184   Cancer of the skin, basal cell  05/06/2021 Initial Diagnosis   Cancer of the skin, basal cell   08/30/2021 - 12/31/2021 Chemotherapy   Patient is on Treatment Plan : Walker q21d     08/30/2021 -  Chemotherapy   Patient is on Treatment Plan : ADVANCED CUTANEOUS SQUAMOUS CELL CARCINOMA  Cemiplimab q21d        MEDICAL HISTORY:  Past Medical History:  Diagnosis Date   B12 deficiency    Cancer of the skin, 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   CHF (congestive heart failure) (HCC)    Diabetes mellitus without complication ([place])    History of prostate cancer    Hyperlipidemia    Hypertension    Hypoglycemia 05/06/2021   Iron deficiency anemia 02/20/2020   Leucocytosis    Leukemia, lymphocytic, chronic (HCC)     SURGICAL HISTORY: Past Surgical History:  Procedure Laterality Date   PROSTATE BIOPSY      SOCIAL HISTORY: Social History   Socioeconomic History   Marital status: Divorced    Spouse name: Not on file   Number of children: 2   Years of education: Not on file   Highest education level: Not on file  Occupational History   Not on file  Tobacco Use   Smoking status: Former   Smokeless tobacco: Never  Substance and Sexual Activity   Alcohol use: Not Currently   Drug use: Never   Sexual activity: Not Currently  Other Topics Concern   Not on file  Social History Narrative   Not on file   Social Determinants of Health   Financial Resource Strain: Not on file  Food Insecurity: Food Insecurity Present (02/18/2022)   Hunger Vital Sign    Worried About Running Out of Food in the Last Year: Sometimes true    Ran Out of Food in the Last Year: Sometimes true  Transportation Needs: Unknown (02/18/2022)   PRAPARE - Hydrologist (Medical): No    Lack of Transportation (Non-Medical): Not  on file  Physical Activity: Not on file  Stress: Not on file  Social Connections: Not on file  Intimate Partner Violence: Not At Risk (02/18/2022)   Humiliation, Afraid, Rape, and Kick questionnaire    Fear of Current or Ex-Partner: No    Emotionally Abused: No    Physically Abused: No    Sexually Abused: No    FAMILY HISTORY: Family History  Problem Relation Age of Onset   Ovarian cancer Mother 17   Cancer Sister 71    ALLERGIES:  has No Known  Allergies.  MEDICATIONS:  Current Facility-Administered Medications  Medication Dose Route Frequency Provider Last Rate Last Admin   acetaminophen (TYLENOL) tablet 650 mg  650 mg Oral Q6H PRN Marcelyn Bruins, MD       Or   acetaminophen (TYLENOL) suppository 650 mg  650 mg Rectal Q6H PRN Marcelyn Bruins, MD       amiodarone (PACERONE) tablet 200 mg  200 mg Oral Daily Marcelyn Bruins, MD   200 mg at 02/18/22 0849   apixaban (ELIQUIS) tablet 5 mg  5 mg Oral BID Marcelyn Bruins, MD   5 mg at 02/18/22 0848   cefTRIAXone (ROCEPHIN) 2 g in sodium chloride 0.9 % 100 mL IVPB  2 g Intravenous Q24H Sheikh, Omair Latif, DO       Chlorhexidine Gluconate Cloth 2 % PADS 6 each  6 each Topical Daily Raiford Noble De [place], DO   6 each at 02/18/22 1144   heparin lock flush 100 unit/mL  500 Units Intracatheter Daily PRN Rosanne Sack A, PA-C       heparin lock flush 100 unit/mL  250 Units Intracatheter PRN Mosher, Vida Roller A, PA-C       heparin lock flush 100 unit/mL  250 Units Intracatheter PRN [place] Scrape A, NP       insulin aspart (novoLOG) injection 0-9 Units  0-9 Units Subcutaneous TID WC Marcelyn Bruins, MD   5 Units at 02/18/22 1525   levothyroxine (SYNTHROID) tablet 75 mcg  75 mcg Oral Q0600 Marcelyn Bruins, MD   75 mcg at 02/18/22 0552   midodrine (PROAMATINE) tablet 5 mg  5 mg Oral TID WC Sheikh, Omair [place], DO   5 mg at 02/18/22 1524   ondansetron (ZOFRAN) tablet 4 mg  4 mg Oral Q6H PRN Marcelyn Bruins, MD       Or   ondansetron [facility]) injection 4 mg  4 mg Intravenous Q6H PRN Marcelyn Bruins, MD       pantoprazole (PROTONIX) EC tablet 40 mg  40 mg Oral Daily Marcelyn Bruins, MD   40 mg at 02/18/22 0848   polyethylene glycol (MIRALAX / GLYCOLAX) packet 17 g  17 g Oral Daily PRN Marcelyn Bruins, MD       rOPINIRole (REQUIP) tablet 0.5 mg  0.5 mg Oral QHS Marcelyn Bruins, MD   0.5 mg at 02/18/22 7628   rOPINIRole (REQUIP) tablet 0.5 mg  0.5 mg Oral QHS PRN  Marcelyn Bruins, MD       rosuvastatin (CRESTOR) tablet 10 mg  10 mg Oral Daily Marcelyn Bruins, MD   10 mg at 02/18/22 0848   sodium chloride flush (NS) 0.9 % injection 3 mL  3 mL Intravenous Q12H Marcelyn Bruins, MD   3 mL at 02/18/22 0850   sodium chloride flush (NS) 0.9 % injection 3 mL  3 mL Intracatheter PRN Mosher, Thalia Bloodgood, PA-C  sodium chloride flush (NS) 0.9 % injection 3 mL  3 mL Intracatheter PRN [place] Scrape A, NP       Tbo-Filgrastim (GRANIX) injection 300 mcg  300 mcg Subcutaneous q1800 Nicholas Lose, MD        REVIEW OF SYSTEMS:    All other systems were reviewed with the patient and are negative.  PHYSICAL EXAMINATION: ECOG PERFORMANCE STATUS: 2 - Symptomatic, <50% confined to bed  Vitals:   02/18/22 1439 02/18/22 1500  BP:  (!) 81/44  Pulse:  70  Resp:  14  Temp: 98.3 F (36.8 C)   SpO2:  98%   Filed Weights   02/18/22 0000 02/18/22 0605  Weight: 131 lb 9.8 oz (59.7 kg) 144 lb 2.9 oz (65.4 kg)    GENERAL:alert, no distress and comfortable   LABORATORY DATA:  I have reviewed the data as listed Lab Results  Component Value Date   WBC 21.3 (H) 02/18/2022   HGB 8.7 (L) 02/18/2022   HCT 28.0 (L) 02/18/2022   MCV 96.2 02/18/2022   PLT 196 02/18/2022   Lab Results  Component Value Date   NA 138 02/18/2022   K 4.5 02/18/2022   CL 109 02/18/2022   CO2 22 02/18/2022    RADIOGRAPHIC STUDIES: I have personally reviewed the radiological reports and agreed with the findings in the report.  ASSESSMENT AND PLAN:  Neutropenic fever: Currently on broad-spectrum antibiotics.  I am starting the patient on Granix 300 mcg daily.  His ANC today 0.3.  Once his [place] crosses 1000 he can discontinue Granix. Anemia: Secondary to treatment with acalabrutinib versus underlying bone marrow suppression with his iron deficiency anemia.  No indication for transfusion at this time. Squamous cell carcinoma of the skin: Previously was on cemiplimab.  Continue  with supportive care.  Please do not state to call me for any questions or concerns.   All questions were answered. The patient knows to call the clinic with any problems, questions or concerns.    Harriette Ohara, MD '@T'$ @

## 2022-02-18 NOTE — Progress Notes (Addendum)
PROGRESS NOTE    Fernando Young  XKG:[id number] DOB: Sep 10, 1940 DOA: 02/17/2022 PCP: Raelene Bott, MD   Brief Narrative:  The patient is a chronically ill appearing Caucasian male with a past medical history significant for but #2 CLL, history of squamous cell carcinoma, history of chronic combined systolic and diastolic CHF, paroxysmal atrial fibrillation, BPH, hypertension, history of GI bleed, diabetes mellitus type 2, hypothyroidism, carotid artery disease, history of polyps, as well as prostate cancer and other comorbidities who presented with neutropenic fever and not feeling well.  He is normally seen in [place] and is getting chemotherapy which is currently being held due to ongoing issues with symptoms.  He was recently treated for enteropathic E. coli in the last month with improvement after admission and began to feel worse recently and noted some black stools which progressed last several days to diarrhea with coffee-ground appearance.  He also had decreased p.o. intake due to fear of diarrhea and subsequent worsening weakness.  Also reports some congestion and had fevers recorded as high as 100.8 at home.  Given him not feeling well he presented to the [facility] ED and there he had Initial blood pressure in the 70Y systolic and he was given 500 cc bolus today with improvement.  Lab work there showed a chronic leukocytosis and his flu and COVID negative.  Chest x-ray showed increasing left lower lobe atelectasis versus airspace disease and blood cultures were obtained and grew out 1 out of 4 E. coli.  In the ED received cefepime, Flagyl and 500 cc of IV fluid and was transferred to [facility] and oncology has been consulted and following.  Cultures are being repeated here and he has been placed on IV antibiotics and de-escalated to IV ceftriaxone.  Given his persistent hypotension PCCM has been consulted for further evaluation given his significant CHF history with concern for volume overload  with too much fluid resuscitation.  Assessment and Plan:  Diarrhea likely in the setting of enteropathic E. coli Neutropenic fever in the setting of E. coli bacteremia -Patient presenting with generally feeling unwell concern for black stools and diarrhea.  Also with congestion and fever as high as 100.8 at home.  Noted to have neutropenia with ANC of 0.1.  Now a ANC is 0.30 > In the setting of CLL as below. > Started vancomycin and cefepime on advice of oncology in ED at outside hospital but this has been changed to IV ceftriaxone given his E. coli bacteremia. > Initially some hypotension but this appears to have improved with 500 cc bolus remains persistently hypotensive. > Negative for flu COVID and RSV at outside hospital.  They have ordered C. difficile and GI pathogen panel which are pending due to his diarrhea.  Urine cultures and blood cultures are pending.  Chest x-ray also stated there was some increased atelectasis versus airspace disease of the left lower lobe. -Repeat cultures and blood cultures x2, urinalysis and urine culture ordered -Chest x-ray done and showed "Linear opacity in the right lower lung most likely represents atelectasis, but in the setting of a neutropenic fever, infection is not entirely excluded. Consider further evaluation with CT chest for more definitive characterization." - Oncology consulted and appreciate further evaluation recommendations -Urinalysis did not show any signs of infection -WBC has gone from 15.2 -> 19.3 -> 20.5 -> 21.3 - Continue to monitor on stepdown unit for now - Continue vancomycin and cefepime and this is just de-escalated to IV ceftriaxone - Trend fever curve and  WBC/ANC - Follow-up outside hospital urine culture and blood cultures - Follow-up outside hospital GI pathogen panel and C. difficile studies and this was negative -Given persistently low hypotension pulmonary critical care has been consulted for further evaluation  recommendations  Hypertension and now remains hypotensive still which is persistent > Initially hypotensive in the ED with blood pressures reportedly as low as the 36R systolic however this improved significantly with 500 cc bolus.  Outpatient blood pressures appear to be in the 80s to 100s in the past couple weeks per chart review. - Holding home Lasix and benazepril for now -Given 2 500 mL boluses and 250 mL bolus today given his history of CHF; received 500 mL bolus yesterday -We will add midodrine 5 mg p.o. 3 times daily with meals and PCCM has been consulted given his persistent low blood pressures with maps in the 50s    CLL Squama cell carcinoma > History of squamous cell carcinoma had been on Erivedge but developed additional lesions and was started on,Comiplimab, but this was held due to worsening fatigue. > History of CLL, treatment initially held due to comorbidities and that he was started on acalabrutinib, however he developed GI bleeding this has been held. > Does follow with oncology in [place], [place] [facility]. - Oncology is aware of patient and they have started the patient on Granix given his neutropenic fever and recommending broad-spectrum antibiotics - Continue to monitor - Supportive care and continuing antibiotics as above and oncology has started the patient on Granix 300 mcg daily and recommending discontinuing once his ANC crosses 1000   Chronic combined systolic and diastolic CHF > Known history of CHF last echo was earlier this month with EF of 35%, G1 DD, normal RV function. - Holding home Lasix in the setting of hypotension during initial evaluation in the ED - Avoid excessive IV fluids but he had to be given 2 500 mL boluses in the 250 L bolus today given his hypotension.  Received 500 mL yesterday so will be status post 1.75 L total -Continue to monitor and trend renal function and electrolytes and continue strict I's and O's and daily weights -Continue monitor for  signs and symptoms of volume overload   Paroxysmal atrial fibrillation - Continue home amiodarone and Eliquis -Continue monitor on telemetry in the stepdown unit   History of GI bleed Dark stools Anemia likely chronic kidney disease versus cancer associated with iron deficiency anemia > No history of GI bleed this was a complication of chemotherapy in the past.  Family had some concerns about recurrent dark stools however hemoglobin remained stable. -His Eliquis has been resumed and his hemoglobin/hematocrit has remained relatively stable last few checks and has gone from 9.2/30.2 -> 8.9/29.2 -> 8.7/28.0 -Continue to monitor and transfuse for hemoglobin less than 7 currently has no indication for PRBC transfusion - We will continue with PPI -Continue to monitor for signs and symptoms of bleeding -Repeat CBC in the a.m.   Diabetes - SSI -CBGs ranging from 100-268   Hypothyroidism - Continue home levothyroxine -Recent TSH was 1.543 and T4 was 8.3   RLS - Continue home ropinirole with 0.5 mg p.o. nightly and with an additional 0.5 mg p.o. nightly as needed if the bedtime dose does not work   Carotid artery disease - Continue home rosuvastatin 10 mg p.o. total - On Eliquis as above   History of BPH and prostate cancer - Noted we will need to continue to monitor strict I's and O's  Severe Protein Calorie Malnutrition -Nutrition Status: Nutrition Problem: Severe Malnutrition Etiology: chronic illness Signs/Symptoms: severe fat depletion, severe muscle depletion Interventions: Liberalize Diet, Ensure Enlive (each supplement provides 350kcal and 20 grams of protein)  DVT prophylaxis:  apixaban (ELIQUIS) tablet 5 mg    Code Status: DNR Family Communication: No family currently at bedside  Disposition Plan:  Level of care: Stepdown Status is: Inpatient Remains inpatient appropriate because: Patient remains persistently hypotensive so now we have consulted critical care.  We  will need to continue to treat his E. coli bacteremia   Consultants:  Medical oncology Pulmonary critical care  Procedures:  As delineated as above  Antimicrobials:  Anti-infectives (From admission, onward)    Start     Dose/Rate Route Frequency Ordered Stop   02/18/22 2000  vancomycin (VANCOCIN) IVPB 1000 mg/200 mL premix  Status:  Discontinued        1,000 mg 200 mL/hr over 60 Minutes Intravenous Every 24 hours 02/18/22 0003 02/18/22 0704   02/18/22 2000  vancomycin (VANCOREADY) IVPB 1250 mg/250 mL  Status:  Discontinued        1,250 mg 166.7 mL/hr over 90 Minutes Intravenous Every 24 hours 02/18/22 0704 02/18/22 0705   02/18/22 2000  vancomycin (VANCOCIN) IVPB 1000 mg/200 mL premix  Status:  Discontinued        1,000 mg 200 mL/hr over 60 Minutes Intravenous Every 24 hours 02/18/22 0705 02/18/22 0956   02/18/22 1800  cefTRIAXone (ROCEPHIN) 2 g in sodium chloride 0.9 % 100 mL IVPB        2 g 200 mL/hr over 30 Minutes Intravenous Every 24 hours 02/18/22 0956     02/18/22 0600  ceFEPIme (MAXIPIME) 2 g in sodium chloride 0.9 % 100 mL IVPB  Status:  Discontinued        2 g 200 mL/hr over 30 Minutes Intravenous Every 12 hours 02/18/22 0003 02/18/22 0956       Subjective: Seen and examined at bedside and he is feeling a little bit better but blood pressure still remained on the softer side.  Temperature is improved.  Patient feels a little bit better today.  No nausea or vomiting.  Still having some diarrhea.  No other concerns or points at this time.  Objective: Vitals:   02/18/22 1400 02/18/22 1439 02/18/22 1500 02/18/22 1600  BP: (!) 85/46  (!) 81/44 (!) 95/49  Pulse: 69  70 64  Resp: (!) '22  14 18  '$ Temp:  98.3 F (36.8 C)    TempSrc:  Oral    SpO2: 98%  98% 99%  Weight:      Height:        Intake/Output Summary (Last 24 hours) at 02/18/2022 1657 Last data filed at 02/18/2022 1544 Gross per 24 hour  Intake 956.59 ml  Output 375 ml  Net 581.59 ml   Filed Weights    02/18/22 0000 02/18/22 0092  Weight: 59.7 kg 65.4 kg   Examination: Physical Exam:  Constitutional: Thin elderly Caucasian chronically ill-appearing male who appears a little uncomfortable Respiratory: Diminished to auscultation bilaterally with coarse breath sounds, no wheezing, rales, rhonchi or crackles. Normal respiratory effort and patient is not tachypenic. No accessory muscle use.  Unlabored breathing Cardiovascular: RRR, no murmurs / rubs / gallops. S1 and S2 auscultated.  Has 2+ lower extremity pitting edema Abdomen: Soft, slightly-tender, non-distended. Bowel sounds positive.  GU: Deferred. Musculoskeletal: No clubbing / cyanosis of digits/nails. No joint deformity upper and lower extremities.  Skin: No rashes, lesions,  ulcers. No induration; Warm and dry.  Neurologic: CN 2-12 grossly intact with no focal deficits. Romberg sign cerebellar reflexes not assessed.  Psychiatric: Normal judgment and insight. Alert and oriented x 3. Slightly anxious mood and appropriate affect.   Data Reviewed: I have personally reviewed following labs and imaging studies  CBC: Recent Labs  Lab 02/17/22 2339 02/18/22 0232 02/18/22 0916  WBC 19.3* 20.5* 21.3*  NEUTROABS  --   --  0.3*  HGB 9.2* 8.9* 8.7*  HCT 30.2* 29.2* 28.0*  MCV 99.7 98.6 96.2  PLT 190 200 409   Basic Metabolic Panel: Recent Labs  Lab 02/18/22 0232  NA 138  K 4.5  CL 109  CO2 22  GLUCOSE 108*  BUN 28*  CREATININE 0.97  CALCIUM 8.3*   GFR: Estimated Creatinine Clearance: 53.4 mL/min (by C-G formula based on SCr of 0.97 mg/dL). Liver Function Tests: Recent Labs  Lab 02/18/22 0232  AST 29  ALT 34  ALKPHOS 39  BILITOT 0.6  PROT 5.0*  ALBUMIN 2.5*   No results for input(s): "LIPASE", "AMYLASE" in the last 168 hours. No results for input(s): "AMMONIA" in the last 168 hours. Coagulation Profile: No results for input(s): "INR", "PROTIME" in the last 168 hours. Cardiac Enzymes: No results for input(s):  "CKTOTAL", "CKMB", "CKMBINDEX", "TROPONINI" in the last 168 hours. BNP (last 3 results) No results for input(s): "PROBNP" in the last 8760 hours. HbA1C: No results for input(s): "HGBA1C" in the last 72 hours. CBG: Recent Labs  Lab 02/18/22 0806 02/18/22 1134 02/18/22 1522  GLUCAP 100* 151* 268*   Lipid Profile: No results for input(s): "CHOL", "HDL", "LDLCALC", "TRIG", "CHOLHDL", "LDLDIRECT" in the last 72 hours. Thyroid Function Tests: No results for input(s): "TSH", "T4TOTAL", "FREET4", "T3FREE", "THYROIDAB" in the last 72 hours. Anemia Panel: No results for input(s): "VITAMINB12", "FOLATE", "FERRITIN", "TIBC", "IRON", "RETICCTPCT" in the last 72 hours. Sepsis Labs: No results for input(s): "PROCALCITON", "LATICACIDVEN" in the last 168 hours.  No results found for this or any previous visit (from the past 240 hour(s)).   Radiology Studies: DG CHEST PORT 1 VIEW  Result Date: 02/18/2022 CLINICAL DATA:  Neutropenic fever EXAM: PORTABLE CHEST 1 VIEW COMPARISON:  05/25/20 CXR FINDINGS: Right-sided chest port in place with tip the mid SVC. No pleural effusion. No pneumothorax. Cardiac and mediastinal contours are unchanged compared to prior exam. There is a linear opacity in the right lower lung which is new from prior exam. There is also poor visualization of the medial aspect of the left hemidiaphragm, which could be secondary to an additional superimposed airspace opacity. Increased prominence of the descending right pulmonary artery, possibly positional. No displaced rib fractures. Visualized upper abdomen is unremarkable. IMPRESSION: Linear opacity in the right lower lung most likely represents atelectasis, but in the setting of a neutropenic fever, infection is not entirely excluded. Consider further evaluation with CT chest for more definitive characterization. Electronically Signed   By: Marin Roberts M.D.   On: 02/18/2022 12:19    Scheduled Meds:  amiodarone  200 mg Oral Daily    apixaban  5 mg Oral BID   Chlorhexidine Gluconate Cloth  6 each Topical Daily   insulin aspart  0-9 Units Subcutaneous TID WC   levothyroxine  75 mcg Oral Q0600   midodrine  5 mg Oral TID WC   pantoprazole  40 mg Oral Daily   rOPINIRole  0.5 mg Oral QHS   rosuvastatin  10 mg Oral Daily   sodium chloride flush  3  mL Intravenous Q12H   Tbo-filgastrim (GRANIX) SQ  300 mcg Subcutaneous q1800   Continuous Infusions:  cefTRIAXone (ROCEPHIN)  IV      LOS: 1 day   Raiford Noble, DO [facility] Available via Epic secure chat 7am-7pm After these hours, please refer to coverage provider listed on [email or web address] 02/18/2022, 4:57 PM

## 2022-02-18 NOTE — Consult Note (Signed)
NAME:  Fernando Young, MRN:  [id number], DOB:  02-18-1941, LOS: 1 ADMISSION DATE:  02/17/2022, CONSULTATION DATE:  02/18/22 REFERRING MD:  Dr. Alfredia Ferguson, CHIEF COMPLAINT:  hypotension   History of Present Illness:   36 yoM with PMH as below significant for CLL, squamous cell carcinoma, HFrEF, PAF on Eliquis, HTN, CAD, and DM who initially presented to [facility] ER on 10/19 transferred to [facility] 10/19 with neutropenic fevers.  PCCM consulted given ongoing hypotension.   Treatment is currently on hold for his CLL due to multiple medical problems.  He was being treated with cemiplimab for his skin cancer but this was held given worsening fatigue.  On chart review, patient with intermittent diarrhea, stool tested during oncology office visit found positive for enteropathogenic E. Coli 01/12/22 treated with cipro for 10 days.  Patient reports fever at home, tmax 100.8 for 1-2 days and feeling worse generally.  Ongoing intermittent diarrhea, and decreased PO intake, and progressive weakness at home.  He denies any SOB, change in baseline nonproductive cough, N/V or abd pain.  Has chronic LE edema on lasix at home.    Initially hypotensive at OHS improved with NS 500 bolus.  Labs significant for ANC 0.1, Hgb stable at 9.6, and normal lactic acid and renal function.  Patient cultured and started on empiric vancomycin, flagyl and cefepime.  UA neg.  CXR questions linear opacity in RLL favoring atelectasis but can not rule out pneumonia.  Outside blood cultures showing 1/4 of e. Coli.  Repeat cultures sent today.  His sBP has remained in the 70-90's with MAPs int he 50-60's, treated with NS 250, then 567m, and currently getting additional 500 ml with some improvement.   Pertinent  Medical History  CLL, squamous cell carcinoma, HFrEF, PAF on Eliquis, HTN, BPH, DM, hypothyroidism, CAD, RLS, prostate ca, GI bleed  DNR  Significant Hospital Events: Including procedures, antibiotic start and stop dates in addition to  other pertinent events   [facility]   Interim History / Subjective:  Patient currently has no complaints.   Objective   Blood pressure (!) 81/44, pulse 70, temperature 98.3 F (36.8 C), temperature source Oral, resp. rate 14, height '5\' 10"'$  (1.778 m), weight 65.4 kg, SpO2 98 %.        Intake/Output Summary (Last 24 hours) at 02/18/2022 1549 Last data filed at 02/18/2022 1544 Gross per 24 hour  Intake 956.59 ml  Output 375 ml  Net 581.59 ml   Filed Weights   02/18/22 0000 02/18/22 09629 Weight: 59.7 kg 65.4 kg   Examination: General:  Thin and frail appearing elderly male sitting upright in bed in NAD, resting HEENT: MM pink/dry, no JVD, edentulous, speech is somewhat difficult to understand, pt feels voice is hoarse Neuro:  easily awakens to verbal, oriented/ appropriate, MAE CV: rr, NSR, no murmur, R port accessed  PULM:  non labored, clear throughout, room air GI: soft, bs+, NT/ ND Extremities: warm/dry, LE +2 pitting edema  Skin: chronic skin changes to head, face and neck  Resolved Hospital Problem list    Assessment & Plan:   Hypotension Neutropenic fever - OSH BC reported 1/4 e. Coli.  Repeat BC sent today - follow outside cultures.  UC sent here  - suspect RLL opacity is atelectasis given lack of clinical symptoms for PNA.  Encourage good pulmonary hygiene  - continue empiric abx for now, cefepime/ vanc - hold off on vasopressors for now.  Still looks intravascularly dry, given hx of ongoing intermittent diarrhea and poor PO intake.  MAPs are improving with slow volume resuscitation.  If ongoing hypotension after this NS 500 bolus (will be total of 1243m here and 500 ml at OSH), can repeat LR bolus 500.  Remains on room air, no JVD, and lungs clear.   - check cortisol.  Recent TSH wnl - lactic reassuring at OHS and renal function remains stable  - follow CBC/ ANC and fever curve  - patient is DNR but is ok with  vasopressors if needed via access R chest port.    Diarrhea with dark stools  - one BM overnight and one today> reported brown/ green, some consistency to stool, does not qualify to send for cdiff testing - on chart review, dark stools have been attributed to his chronic peto-bismol use as Hgb levels have remained stable on Eliquis  - cont PPI    HFrEF HTN - 02/03/22 EF 35, G1DD, RV ok - monitor volume status very closely  - continue to hold home lasix, benazepril.  Review of chart noted BP running low, with SBP in the 80's at outpt visits.  Metoprolol recently stopped by cardiology due to hypotension.     PAF - cont amio and Eliquis  - tele monitoring, remains in NSR   Chronic Anemia - Hgb appears stable.  No melena or obvious bloody stools - send T&S   CLL Squamous cell carcinoma  - chemo currently on hold given multiple medical problems - oncology following    Severe protein calorie malnutrition Failure to thrive- 40lb weight loss this year and seems to have progressive functional decline - diet per RD recs   Best Practice (right click and "Reselect all SmartList Selections" daily)   Diet/type: Regular consistency (see orders) DVT prophylaxis: DOAC GI prophylaxis: PPI Lines: N/A, R port accessed  Foley:  N/A Code Status:  DNR> vasopressors ok Last date of multidisciplinary goals of care discussion [per primary team]  Labs   CBC: Recent Labs  Lab 02/17/22 2339 02/18/22 0232 02/18/22 0916  WBC 19.3* 20.5* 21.3*  NEUTROABS  --   --  0.3*  HGB 9.2* 8.9* 8.7*  HCT 30.2* 29.2* 28.0*  MCV 99.7 98.6 96.2  PLT 190 200 1062   Basic Metabolic Panel: Recent Labs  Lab 02/18/22 0232  NA 138  K 4.5  CL 109  CO2 22  GLUCOSE 108*  BUN 28*  CREATININE 0.97  CALCIUM 8.3*   GFR: Estimated Creatinine Clearance: 53.4 mL/min (by C-G formula based on SCr of 0.97 mg/dL). Recent Labs  Lab 02/17/22 2339 02/18/22 0232 02/18/22 0916  WBC 19.3* 20.5* 21.3*     Liver Function Tests: Recent Labs  Lab 02/18/22 0232  AST 29  ALT 34  ALKPHOS 39  BILITOT 0.6  PROT 5.0*  ALBUMIN 2.5*   No results for input(s): "LIPASE", "AMYLASE" in the last 168 hours. No results for input(s): "AMMONIA" in the last 168 hours.  ABG No results found for: "PHART", "PCO2ART", "PO2ART", "HCO3", "TCO2", "ACIDBASEDEF", "O2SAT"   Coagulation Profile: No results for input(s): "INR", "PROTIME" in the last 168 hours.  Cardiac Enzymes: No results for input(s): "CKTOTAL", "CKMB", "CKMBINDEX", "TROPONINI" in the last 168 hours.  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   Surgical History:   Past Surgical History:  Procedure Laterality Date   PROSTATE BIOPSY       Social History:   reports that he has quit smoking. He has never used smokeless tobacco. He reports that he does not currently use alcohol. He reports that he does not use drugs.   Family History:  His family history includes Cancer (age of onset: 60) in his sister; Ovarian cancer (age of onset: 6) in his mother.   Allergies No Known Allergies   Home Medications  Prior to Admission medications   Medication Sig Start Date End Date Taking? Authorizing Provider  acetaminophen (TYLENOL) 500 MG tablet Take 1,000 mg by mouth every 6 (six) hours as needed for mild pain, moderate pain or fever.   Yes [provider]  amiodarone (PACERONE) 200 MG tablet Take 200 mg by mouth  daily. 03/10/20  Yes [provider]  apixaban (ELIQUIS) 5 MG TABS tablet Take 1 tablet by mouth 2 (two) times daily. 02/09/22  Yes [provider]  Ascorbic Acid (VITAMIN C) 500 MG CAPS Take 500 mg by mouth daily.   Yes [provider]  benazepril (LOTENSIN) 20 MG tablet Take 10 mg by mouth daily. Taking half a tab do to low BP per PCP, not wanting to stop meds with heart hx   Yes [provider]  bismuth subsalicylate (PEPTO BISMOL) 262 MG/15ML suspension Take 30 mLs by mouth every 6 (six) hours as needed. For constipation 01/05/22  Yes [provider]  cholecalciferol (VITAMIN D3) 25 MCG (1000 UNIT) tablet Take 1,000 Units by mouth daily.   Yes [provider]  furosemide (LASIX) 40 MG tablet Take 40 mg by mouth daily. 11/23/21  Yes [provider]  ketoconazole (NIZORAL) 2 % shampoo Apply topically 3 (three) times a week. 02/03/21  Yes [provider]  levothyroxine (SYNTHROID) 75 MCG tablet Take 75 mcg by mouth daily before breakfast. 12/06/21  Yes [provider]  metFORMIN (GLUCOPHAGE-XR) 500 MG 24 hr tablet Take 500 mg by mouth 2 (two) times daily. 12/03/20  Yes [provider]  Omega-3 Fatty Acids (FISH OIL) 1000 MG CAPS Take 1,000 mg by mouth daily.   Yes [provider]  rOPINIRole (REQUIP) 0.5 MG tablet Take 0.5-1 mg by mouth See admin instructions. Taking one tablet at bedtime, if no relief take an additional tablet   Yes [provider]  rosuvastatin (CRESTOR) 20 MG tablet Take 10 mg by mouth daily. 02/04/21  Yes [provider]  vitamin B-12 (CYANOCOBALAMIN) 500 MCG tablet Take 500 mcg by mouth daily.   Yes [provider]  acalabrutinib maleate (CALQUENCE) 100 MG tablet Take 100 mg by mouth 2 (two) times daily. Patient not taking: Reported on 02/10/2022 11/17/21   Rosanne Sack A, PA-C  pantoprazole (PROTONIX) 40 MG tablet Take 1 tablet (40 mg total) by mouth  daily. Patient not taking: Reported on 02/10/2022 01/14/22   Marvia Pickles, PA-C     Critical care time: 92 mins      Kennieth Rad, MSN, AG-ACNP-BC [facility] 02/18/2022, 5:24 PM  See Amion for pager If no response to pager, please call PCCM consult pager After 7:00 pm call Elink

## 2022-02-18 NOTE — Progress Notes (Signed)
NOTED BLOOD CULTURES OBTAINED FROM [facility]; Reported Gram Negative rods (Ecoli) in 1 of 4 bottles. Message sent via  text to night on call. Pending orders( noted is on several antibodics Vanc and Maxipime) report given to on coming RN

## 2022-02-18 NOTE — Progress Notes (Signed)
Initial Nutrition Assessment  DOCUMENTATION CODES:   Severe malnutrition in context of chronic illness  INTERVENTION:  -Continue regular diet as tolerated -Pt declines ONS at this time, provide Ensure Plus HP BID if average meal acceptance <75% -Encourage high calorie/high protein foods: peanut butter, cheese, yogurt eggs -Encourage 3 regular meals and snacks in between.  NUTRITION DIAGNOSIS:  Severe Malnutrition related to chronic illness as evidenced by severe fat depletion, severe muscle depletion.  GOAL:  Patient will meet greater than or equal to 90% of their needs  MONITOR:  PO intake  REASON FOR ASSESSMENT:  Malnutrition Screening Tool    ASSESSMENT:  Pt is an 81yo M with PMH of CLL, squamous cell carcinoma, CHF, afib, BPH, HTN, DM, hypothyroidism, CAD, RLS, and prostate cancer who presents with neutropenic fever. Per chart review chemotherapy has been on hold for ongoing issues. Blood cultures positive for E Coli.  Visited pt at bedside after breakfast. He reports a good appetite and observed >75% meal consumption this AM. He reports weight loss in the last 1-2 years but weight has been stable over the last month. Pt uses Ensure supplements at home but adds fruit to improve the taste. No interested in ONS during admission. NFPE completed and shows severe muscle wasting and severe fat loss. Pt meets ASPEN criteria for severe protein calorie malnutrition r/t chronic illness (cancer and chemo).  Continue regular diet as tolerated. Provide high calorie, high protein foods: peanut butter, cheese, yogurt, eggs, etc. Encourage 3 regular meals. Encourage ONS if average meal acceptance <75%.  Medications reviewed and include: eliquis, novolog, synthroid, protonix, crestor  Labs reviewed: BG:100-108, BUN:28   NUTRITION - FOCUSED PHYSICAL EXAM:  Flowsheet Row Most Recent Value  Orbital Region Severe depletion  Upper Arm Region Severe depletion  Thoracic and Lumbar Region  Moderate depletion  Buccal Region Severe depletion  Temple Region Severe depletion  Clavicle Bone Region Severe depletion  Clavicle and Acromion Bone Region Severe depletion  Scapular Bone Region Unable to assess  Dorsal Hand Severe depletion  Patellar Region Moderate depletion  Anterior Thigh Region Moderate depletion  Posterior Calf Region Severe depletion  Hair Reviewed  Eyes Reviewed  Mouth Reviewed  Skin Reviewed  Nails Reviewed       Diet Order:   Diet Order             Diet regular Room service appropriate? Yes; Fluid consistency: Thin  Diet effective now                   EDUCATION NEEDS:   Education needs have been addressed  Skin:  Skin Assessment: Reviewed RN Assessment  Last BM:  10/20  Height:  Ht Readings from Last 1 Encounters:  02/18/22 '5\' 10"'$  (1.778 m)    Weight:  Wt Readings from Last 1 Encounters:  02/18/22 65.4 kg   BMI:  Body mass index is 20.69 kg/m.  Estimated Nutritional Needs:   Kcal:  1965-2290kcal  Protein:  100-130g  Fluid:  >1965m  KCandise Bowens MS, RD, LDN, CNSC See AMiON for contact information

## 2022-02-19 ENCOUNTER — Inpatient Hospital Stay (HOSPITAL_COMMUNITY): Payer: Medicare Other

## 2022-02-19 DIAGNOSIS — D508 Other iron deficiency anemias: Secondary | ICD-10-CM

## 2022-02-19 DIAGNOSIS — D709 Neutropenia, unspecified: Secondary | ICD-10-CM | POA: Diagnosis not present

## 2022-02-19 DIAGNOSIS — E039 Hypothyroidism, unspecified: Secondary | ICD-10-CM | POA: Diagnosis not present

## 2022-02-19 DIAGNOSIS — E861 Hypovolemia: Secondary | ICD-10-CM | POA: Diagnosis not present

## 2022-02-19 DIAGNOSIS — R5081 Fever presenting with conditions classified elsewhere: Secondary | ICD-10-CM | POA: Diagnosis not present

## 2022-02-19 DIAGNOSIS — I10 Essential (primary) hypertension: Secondary | ICD-10-CM | POA: Diagnosis not present

## 2022-02-19 DIAGNOSIS — I9589 Other hypotension: Secondary | ICD-10-CM | POA: Diagnosis not present

## 2022-02-19 DIAGNOSIS — C9110 Chronic lymphocytic leukemia of B-cell type not having achieved remission: Secondary | ICD-10-CM | POA: Diagnosis not present

## 2022-02-19 LAB — CBC WITH DIFFERENTIAL/PLATELET
Abs Immature Granulocytes: 0.26 10*3/uL — ABNORMAL HIGH (ref 0.00–0.07)
Basophils Absolute: 0.1 10*3/uL (ref 0.0–0.1)
Basophils Relative: 0 %
Eosinophils Absolute: 0.1 10*3/uL (ref 0.0–0.5)
Eosinophils Relative: 0 %
HCT: 27.5 % — ABNORMAL LOW (ref 39.0–52.0)
Hemoglobin: 8.4 g/dL — ABNORMAL LOW (ref 13.0–17.0)
Immature Granulocytes: 1 %
Lymphocytes Relative: 87 %
Lymphs Abs: 23.5 10*3/uL — ABNORMAL HIGH (ref 0.7–4.0)
MCH: 29.6 pg (ref 26.0–34.0)
MCHC: 30.5 g/dL (ref 30.0–36.0)
MCV: 96.8 fL (ref 80.0–100.0)
Monocytes Absolute: 1 10*3/uL (ref 0.1–1.0)
Monocytes Relative: 4 %
Neutro Abs: 2.3 10*3/uL (ref 1.7–7.7)
Neutrophils Relative %: 8 %
Platelets: 183 10*3/uL (ref 150–400)
RBC: 2.84 MIL/uL — ABNORMAL LOW (ref 4.22–5.81)
RDW: 14.3 % (ref 11.5–15.5)
WBC: 27.2 10*3/uL — ABNORMAL HIGH (ref 4.0–10.5)
nRBC: 0 % (ref 0.0–0.2)

## 2022-02-19 LAB — GLUCOSE, CAPILLARY
Glucose-Capillary: 81 mg/dL (ref 70–99)
Glucose-Capillary: 76 mg/dL (ref 70–99)
Glucose-Capillary: 98 mg/dL (ref 70–99)
Glucose-Capillary: 127 mg/dL — ABNORMAL HIGH (ref 70–99)
Glucose-Capillary: 172 mg/dL — ABNORMAL HIGH (ref 70–99)

## 2022-02-19 LAB — COMPREHENSIVE METABOLIC PANEL
ALT: 39 U/L (ref 0–44)
AST: 31 U/L (ref 15–41)
Albumin: 2.2 g/dL — ABNORMAL LOW (ref 3.5–5.0)
Alkaline Phosphatase: 38 U/L (ref 38–126)
Anion gap: 5 (ref 5–15)
BUN: 22 mg/dL (ref 8–23)
CO2: 23 mmol/L (ref 22–32)
Calcium: 7.7 mg/dL — ABNORMAL LOW (ref 8.9–10.3)
Chloride: 108 mmol/L (ref 98–111)
Creatinine, Ser: 0.93 mg/dL (ref 0.61–1.24)
GFR, Estimated: >60 mL/min (ref >60)
Glucose, Bld: 80 mg/dL (ref 70–99)
Potassium: 3.9 mmol/L (ref 3.5–5.1)
Sodium: 136 mmol/L (ref 135–145)
Total Bilirubin: 0.5 mg/dL (ref 0.3–1.2)
Total Protein: 4.9 g/dL — ABNORMAL LOW (ref 6.5–8.1)

## 2022-02-19 LAB — PHOSPHORUS: Phosphorus: 3.2 mg/dL (ref 2.5–4.6)

## 2022-02-19 LAB — URINE CULTURE: Culture: NO GROWTH

## 2022-02-19 LAB — MRSA NEXT GEN BY PCR, NASAL: MRSA by PCR Next Gen: DETECTED — AB

## 2022-02-19 LAB — MAGNESIUM: Magnesium: 1.7 mg/dL (ref 1.7–2.4)

## 2022-02-19 MED ORDER — SODIUM CHLORIDE 0.9 % IV SOLN: INTRAVENOUS | Status: DC

## 2022-02-19 MED ORDER — CHLORHEXIDINE GLUCONATE CLOTH 2 % EX PADS
6.0000 | MEDICATED_PAD | Freq: Every day | CUTANEOUS | Status: AC
  Administered 2022-02-19 – 2022-02-23 (×5): 6 via TOPICAL

## 2022-02-19 MED ORDER — MAGNESIUM SULFATE 2 GM/50ML IV SOLN
2.0000 g | Freq: Once | INTRAVENOUS | Status: AC
  Administered 2022-02-19: 2 g via INTRAVENOUS
  Filled 2022-02-19: qty 50

## 2022-02-19 MED ORDER — SODIUM CHLORIDE 0.9 % IV BOLUS
250.0000 mL | Freq: Once | INTRAVENOUS | Status: AC
  Administered 2022-02-19: 250 mL via INTRAVENOUS

## 2022-02-19 MED ORDER — MUPIROCIN 2 % EX OINT
1.0000 | TOPICAL_OINTMENT | Freq: Two times a day (BID) | CUTANEOUS | Status: AC
  Administered 2022-02-19 – 2022-02-23 (×10): 1 via NASAL
  Filled 2022-02-19 (×2): qty 22

## 2022-02-19 NOTE — Progress Notes (Signed)
PROGRESS NOTE    Fernando Young  OXB:[id number] DOB: [birth year] DOA: 02/17/2022 PCP: Raelene Bott, MD   Brief Narrative:  The patient is a chronically ill appearing Caucasian male with a past medical history significant for but secondary to CLL, history of squamous cell carcinoma, history of chronic combined systolic and diastolic CHF, paroxysmal atrial fibrillation, BPH, hypertension, history of GI bleed, diabetes mellitus type 2, hypothyroidism, carotid artery disease, history of polyps, as well as prostate cancer and other comorbidities who presented with neutropenic fever and not feeling well.  He is normally seen in [place] and is getting chemotherapy which is currently being held due to ongoing issues with symptoms.  He was recently treated for enteropathic E. coli in the last month with improvement after admission and began to feel worse recently and noted some black stools which progressed last several days to diarrhea with coffee-ground appearance.  He also had decreased p.o. intake due to fear of diarrhea and subsequent worsening weakness.  Also reports some congestion and had fevers recorded as high as 100.8 at home.  Given him not feeling well he presented to the [facility] ED and there he had Initial blood pressure in the 68T systolic and he was given 500 cc bolus today with improvement.  Lab work there showed a chronic leukocytosis and his flu and COVID negative.  Chest x-ray showed increasing left lower lobe atelectasis versus airspace disease and blood cultures were obtained and grew out 1 out of 4 E. coli.  In the ED received cefepime, Flagyl and 500 cc of IV fluid and was transferred to [facility] and oncology has been consulted and following.  Cultures are being repeated here and he has been placed on IV antibiotics and de-escalated to IV ceftriaxone.  Given his persistent hypotension PCCM has been consulted for further evaluation given his significant CHF history with concern for volume  overload with too much fluid resuscitation.  Patient blood pressure improved after fluid boluses and ANC improved to.  Cultures are still pending in our system.  He has been de-escalated to IV ceftriaxone and PT OT is to further evaluate and treat   Assessment and Plan:  Diarrhea likely in the setting of enteropathic E. coli Neutropenic fever in the setting of E. coli bacteremia -Patient presenting with generally feeling unwell concern for black stools and diarrhea.  Also with congestion and fever as high as 100.8 at home.  Noted to have neutropenia with ANC of 0.1.  Now a ANC is 0.30 yesterday and now improved to 2.3 so Granix has been discontinued > In the setting of CLL as below. > Started vancomycin and cefepime on advice of oncology in ED at outside hospital but this has been changed to IV ceftriaxone given his E. coli bacteremia. > Initially some hypotension but this appears to have improved with 500 cc bolus remains persistently hypotensive. > Negative for flu COVID and RSV at outside hospital.  They have ordered C. difficile and GI pathogen panel which are pending due to his diarrhea.  Urine cultures and blood cultures are pending.  Chest x-ray also stated there was some increased atelectasis versus airspace disease of the left lower lobe. -Repeat cultures and blood cultures x2, urinalysis and urine culture ordered; blood cultures x2 showing no growth to date so far -Chest x-ray done and showed "Linear opacity in the right lower lung most likely represents atelectasis, but in the setting of a neutropenic fever, infection is not entirely excluded. Consider further evaluation with  CT chest for more definitive characterization." - Oncology consulted and appreciate further evaluation recommendations -Urinalysis did not show any signs of infection -WBC has gone from 15.2 -> 19.3 -> 20.5 -> 21.3 and is now 27.2 - Continue to monitor on stepdown unit for now - Continue vancomycin and cefepime  and this is just de-escalated to IV ceftriaxone - Trend fever curve and WBC/ANC - Follow-up outside hospital urine culture and blood cultures - Follow-up outside hospital GI pathogen panel and C. difficile studies and this was negative -Given persistently low hypotension pulmonary critical care has been consulted for further evaluation recommendations   Hypertension and now remains hypotensive still which is persistent, now improved > Initially hypotensive in the ED with blood pressures reportedly as low as the 94W systolic however this improved significantly with 500 cc bolus.  Outpatient blood pressures appear to be in the 80s to 100s in the past couple weeks per chart review. - Holding home Lasix and benazepril for now -Received total of 1750 mL boluses and his blood pressures improved -We will add midodrine 5 mg p.o. 3 times daily with meals and PCCM has been consulted given his persistent low blood pressures with maps in the 50s    CLL Squama cell carcinoma > History of squamous cell carcinoma had been on Erivedge but developed additional lesions and was started on,Comiplimab, but this was held due to worsening fatigue. > History of CLL, treatment initially held due to comorbidities and that he was started on acalabrutinib, however he developed GI bleeding this has been held. > Does follow with oncology in [place], [place] [facility]. - Oncology is aware of patient and they have started the patient on Granix given his neutropenic fever and recommending broad-spectrum antibiotics - Continue to monitor - Supportive care and continuing antibiotics as above and oncology has started the patient on Granix 300 mcg daily and recommending discontinuing once his ANC crosses 1000 and now it is 2300   Chronic combined systolic and diastolic CHF > Known history of CHF last echo was earlier this month with EF of 35%, G1 DD, normal RV function. - Holding home Lasix in the setting of hypotension during initial  evaluation in the ED - Avoid excessive IV fluids and patient is +587.6 -Continue to monitor and trend renal function and electrolytes and continue strict I's and O's and daily weights -Continue monitor for signs and symptoms of volume overload    Paroxysmal atrial fibrillation - Continue home amiodarone and Eliquis -Continue monitor on telemetry in the stepdown unit   History of GI bleed Dark stools Anemia likely chronic kidney disease versus cancer associated with iron deficiency anemia > No history of GI bleed this was a complication of chemotherapy in the past.  Family had some concerns about recurrent dark stools however hemoglobin remained stable. -His Eliquis has been resumed and his hemoglobin/hematocrit has remained relatively stable last few checks and has gone from 9.2/30.2 -> 8.9/29.2 -> 8.7/28.0 and now 8.4/27.5 -Continue to monitor and transfuse for hemoglobin less than 7 currently has no indication for PRBC transfusion - We will continue with PPI -Continue to monitor for signs and symptoms of bleeding -Repeat CBC in the a.m.   Diabetes Mellitus Type 2 - SSI -CBGs ranging from 76-1 27   Hypothyroidism - Continue home levothyroxine -Recent TSH was 1.543 and T4 was 8.3   RLS - Continue home ropinirole with 0.5 mg p.o. nightly and with an additional 0.5 mg p.o. nightly as needed if the bedtime dose  does not work   Carotid artery disease - Continue home rosuvastatin 10 mg p.o. total - On Eliquis as above   History of BPH and prostate cancer - Noted we will need to continue to monitor strict I's and O's   Severe Protein Calorie Malnutrition -Nutrition Status: Nutrition Problem: Severe Malnutrition Etiology: chronic illness Signs/Symptoms: severe fat depletion, severe muscle depletion Interventions: Liberalize Diet, Ensure Enlive (each supplement provides 350kcal and 20 grams of protein)  DVT prophylaxis:  apixaban (ELIQUIS) tablet 5 mg    Code Status: DNR Family  Communication: Discussed with family at bedside  Disposition Plan:  Level of care: Stepdown Status is: Inpatient Remains inpatient appropriate because: Needs PT OT to further evaluate and treat and his [place] is improved   Consultants:  PCCM  Procedures:  As above  Antimicrobials:  Anti-infectives (From admission, onward)    Start     Dose/Rate Route Frequency Ordered Stop   02/18/22 2000  vancomycin (VANCOCIN) IVPB 1000 mg/200 mL premix  Status:  Discontinued        1,000 mg 200 mL/hr over 60 Minutes Intravenous Every 24 hours 02/18/22 0003 02/18/22 0704   02/18/22 2000  vancomycin (VANCOREADY) IVPB 1250 mg/250 mL  Status:  Discontinued        1,250 mg 166.7 mL/hr over 90 Minutes Intravenous Every 24 hours 02/18/22 0704 02/18/22 0705   02/18/22 2000  vancomycin (VANCOCIN) IVPB 1000 mg/200 mL premix  Status:  Discontinued        1,000 mg 200 mL/hr over 60 Minutes Intravenous Every 24 hours 02/18/22 0705 02/18/22 0956   02/18/22 1800  cefTRIAXone (ROCEPHIN) 2 g in sodium chloride 0.9 % 100 mL IVPB        2 g 200 mL/hr over 30 Minutes Intravenous Every 24 hours 02/18/22 0956     02/18/22 0600  ceFEPIme (MAXIPIME) 2 g in sodium chloride 0.9 % 100 mL IVPB  Status:  Discontinued        2 g 200 mL/hr over 30 Minutes Intravenous Every 12 hours 02/18/22 0003 02/18/22 0956       Subjective: Seen and examined at bedside and was feeling weak.  Denies chest pain or shortness of breath.  Does not know if he still having dark stools or not.  No other concerns or complaints at this time.  Objective: Vitals:   02/19/22 1500 02/19/22 1600 02/19/22 1700 02/19/22 1748  BP: (!) 105/50 (!) 105/47 (!) 107/51   Pulse: (!) 59 60 67   Resp: 20 (!) 22 12   Temp:    98.1 F (36.7 C)  TempSrc:    Oral  SpO2: 97% 98% 97%   Weight:      Height:        Intake/Output Summary (Last 24 hours) at 02/19/2022 1818 Last data filed at 02/19/2022 1705 Gross per 24 hour  Intake 603.98 ml  Output 1100 ml   Net -496.02 ml   Filed Weights   02/18/22 0000 02/18/22 0605 02/19/22 0500  Weight: 59.7 kg 65.4 kg 57.2 kg   Examination: Physical Exam:  Constitutional: Thin chronically ill-appearing elderly Caucasian male in no acute distress Respiratory: Diminished to auscultation bilaterally, no wheezing, rales, rhonchi or crackles. Normal respiratory effort and patient is not tachypenic. No accessory muscle use.  Unlabored breathing Cardiovascular: RRR, no murmurs / rubs / gallops. S1 and S2 auscultated.  Has 1-2+ lower extremity edema Abdomen: Soft, non-tender, nondistended. Bowel sounds positive.  GU: Deferred. Musculoskeletal: No clubbing / cyanosis of digits/nails.  No joint deformity upper and lower extremities. Skin: Has different skin lesions on his face and body Neurologic: CN 2-12 grossly intact with no focal deficits. Romberg sign and cerebellar reflexes not assessed.  Psychiatric: Normal judgment and insight. Alert and oriented x 3. Normal mood and appropriate affect.   Data Reviewed: I have personally reviewed following labs and imaging studies  CBC: Recent Labs  Lab 02/17/22 2339 02/18/22 0232 02/18/22 0916 02/19/22 0325  WBC 19.3* 20.5* 21.3* 27.2*  NEUTROABS  --   --  0.3* 2.3  HGB 9.2* 8.9* 8.7* 8.4*  HCT 30.2* 29.2* 28.0* 27.5*  MCV 99.7 98.6 96.2 96.8  PLT 190 200 196 425   Basic Metabolic Panel: Recent Labs  Lab 02/18/22 0232 02/19/22 0325  NA 138 136  K 4.5 3.9  CL 109 108  CO2 22 23  GLUCOSE 108* 80  BUN 28* 22  CREATININE 0.97 0.93  CALCIUM 8.3* 7.7*  MG  --  1.7  PHOS  --  3.2   GFR: Estimated Creatinine Clearance: 48.7 mL/min (by C-G formula based on SCr of 0.93 mg/dL). Liver Function Tests: Recent Labs  Lab 02/18/22 0232 02/19/22 0325  AST 29 31  ALT 34 39  ALKPHOS 39 38  BILITOT 0.6 0.5  PROT 5.0* 4.9*  ALBUMIN 2.5* 2.2*   No results for input(s): "LIPASE", "AMYLASE" in the last 168 hours. No results for input(s): "AMMONIA" in the  last 168 hours. Coagulation Profile: No results for input(s): "INR", "PROTIME" in the last 168 hours. Cardiac Enzymes: No results for input(s): "CKTOTAL", "CKMB", "CKMBINDEX", "TROPONINI" in the last 168 hours. BNP (last 3 results) No results for input(s): "PROBNP" in the last 8760 hours. HbA1C: No results for input(s): "HGBA1C" in the last 72 hours. CBG: Recent Labs  Lab 02/18/22 1522 02/18/22 2118 02/19/22 0748 02/19/22 1138 02/19/22 1644  GLUCAP 268* 81 76 98 127*   Lipid Profile: No results for input(s): "CHOL", "HDL", "LDLCALC", "TRIG", "CHOLHDL", "LDLDIRECT" in the last 72 hours. Thyroid Function Tests: No results for input(s): "TSH", "T4TOTAL", "FREET4", "T3FREE", "THYROIDAB" in the last 72 hours. Anemia Panel: No results for input(s): "VITAMINB12", "FOLATE", "FERRITIN", "TIBC", "IRON", "RETICCTPCT" in the last 72 hours. Sepsis Labs: No results for input(s): "PROCALCITON", "LATICACIDVEN" in the last 168 hours.  Recent Results (from the past 240 hour(s))  Culture, blood (Routine X 2) w Reflex to ID Panel     Status: None (Preliminary result)   Collection Time: 02/18/22  9:16 AM   Specimen: Right Antecubital; Blood  Result Value Ref Range Status   Specimen Description   Final    RIGHT ANTECUBITAL BLOOD Performed at [facility], [address] [place], [place] [address]    Special Requests   Final    BOTTLES DRAWN AEROBIC ONLY Blood Culture adequate volume Performed at [facility] [address] [place], [place] [address]    Culture   Final    NO GROWTH < 24 HOURS Performed at [facility] [address], [place], [place] [address]    Report Status PENDING  Incomplete  Culture, blood (Routine X 2) w Reflex to ID Panel     Status: None (Preliminary result)   Collection Time: 02/18/22  9:16 AM   Specimen: Left Antecubital; Blood  Result Value Ref Range Status   Specimen Description   Final    LEFT ANTECUBITAL BLOOD Performed  at [facility], [place] [address], [place], [place] [address]  Special Requests   Final    BOTTLES DRAWN AEROBIC ONLY Blood Culture adequate volume Performed at [facility] [address] [place], [place] [address]    Culture   Final    NO GROWTH < 24 HOURS Performed at [facility] [address] [place], [place] [address]    Report Status PENDING  Incomplete  Urine Culture     Status: None   Collection Time: 02/18/22 11:10 AM   Specimen: Urine, Clean Catch  Result Value Ref Range Status   Specimen Description   Final    URINE, CLEAN CATCH Performed at [facility], [place] [address] [place], [place] [address]    Special Requests   Final    Immunocompromised Performed at [facility], [place] [address] [place], [place] [address]    Culture   Final    NO GROWTH Performed at [facility], [place] [address], [place], [place] [address]    Report Status 02/19/2022 FINAL  Final  MRSA Next Gen by PCR, Nasal     Status: Abnormal   Collection Time: 02/18/22  7:30 PM   Specimen: Nasal Mucosa; Nasal Swab  Result Value Ref Range Status   MRSA by PCR Next Gen DETECTED (A) NOT DETECTED Final    Comment: RESULT CALLED TO, READ BACK BY AND VERIFIED WITH: GARLAND,J @ [phone] JMK (NOTE) The GeneXpert MRSA Assay (FDA approved for NASAL specimens only), is one component of a comprehensive MRSA colonization surveillance program. It is not intended to diagnose MRSA infection nor to guide or monitor treatment for MRSA infections. Test performance is not FDA approved in patients less than 27 years old. Performed at [facility], [place] [address] [place], [place] [address]     Radiology Studies: DG CHEST PORT 1 VIEW  Result Date: 02/19/2022 CLINICAL DATA:  SOB EXAM: PORTABLE CHEST - 1 VIEW COMPARISON:  02/18/2022 FINDINGS: Stable right IJ port catheter to the distal SVC. Linear atelectasis or  scarring in the right mid lung slightly more conspicuous. Ill-defined interstitial infiltrates laterally in the right mid lung and in the lung bases left greater than right are basically stable. Heart size and mediastinal contours are within normal limits. No pneumothorax. Blunting of right lateral costophrenic angle possibly positional. Visualized bones unremarkable. IMPRESSION: Persistent bilateral interstitial opacities, left greater than right. Electronically Signed   By: Lucrezia Europe M.D.   On: 02/19/2022 08:00   DG CHEST PORT 1 VIEW  Result Date: 02/18/2022 CLINICAL DATA:  Neutropenic fever EXAM: PORTABLE CHEST 1 VIEW COMPARISON:  05/25/20 CXR FINDINGS: Right-sided chest port in place with tip the mid SVC. No pleural effusion. No pneumothorax. Cardiac and mediastinal contours are unchanged compared to prior exam. There is a linear opacity in the right lower lung which is new from prior exam. There is also poor visualization of the medial aspect of the left hemidiaphragm, which could be secondary to an additional superimposed airspace opacity. Increased prominence of the descending right pulmonary artery, possibly positional. No displaced rib fractures. Visualized upper abdomen is unremarkable. IMPRESSION: Linear opacity in the right lower lung most likely represents atelectasis, but in the setting of a neutropenic fever, infection is not entirely excluded. Consider further evaluation with CT chest for more definitive characterization. Electronically Signed   By: Marin Roberts M.D.   On: 02/18/2022 12:19    Scheduled Meds:  amiodarone  200 mg Oral Daily   apixaban  5 mg Oral  BID   Chlorhexidine Gluconate Cloth  6 each Topical Daily   Chlorhexidine Gluconate Cloth  6 each Topical Q0600   insulin aspart  0-9 Units Subcutaneous TID WC   levothyroxine  75 mcg Oral Q0600   midodrine  5 mg Oral TID WC   mupirocin ointment  1 Application Nasal BID   pantoprazole  40 mg Oral Daily   rOPINIRole  0.5 mg  Oral QHS   rosuvastatin  10 mg Oral Daily   sodium chloride flush  3 mL Intravenous Q12H   Continuous Infusions:  sodium chloride 50 mL/hr at 02/19/22 1705   cefTRIAXone (ROCEPHIN)  IV Stopped (02/19/22 1753)    LOS: 2 days   Raiford Noble, DO [facility] Available via Epic secure chat 7am-7pm After these hours, please refer to coverage provider listed on [email or web address] 02/19/2022, 6:18 PM

## 2022-02-19 NOTE — Progress Notes (Signed)
Patient moved from bed to recliner.  Patient required moderate to max assist to get to side of bed and stand.  Patient transferred to recliner using stand and pivot by RN with max assist.  Patient states he uses both a walker and cane at home but feels weak and sometimes requires assist.

## 2022-02-19 NOTE — Consult Note (Signed)
NAME:  Fernando Young, MRN:  [id number], DOB:  Jan 24, 1941, LOS: 2 ADMISSION DATE:  02/17/2022, CONSULTATION DATE:  02/18/22 REFERRING MD:  Dr. Alfredia Ferguson, CHIEF COMPLAINT:  hypotension   History of Present Illness:  81 yo male with about 30 to 40 lbs weight loss over the past year had recent treatment for enteropathic E coli with diarrhea presented to [facility] ER with fever, hypotension and neutropenia.    Pertinent  Medical History  CLL, squamous cell carcinoma, HFrEF, PAF on Eliquis, HTN, BPH, DM, hypothyroidism, CAD, RLS, prostate ca, GI bleed, DNR  Significant Hospital Events: Including procedures, antibiotic start and stop dates in addition to other pertinent events   10/19 admitted to [facility] from [facility] ER with neutropenic fever   Interim History / Subjective:  Feels better.  Slept okay last night.  Denies chest pain, abdominal pain, nausea.  Objective   Blood pressure (!) 89/48, pulse 62, temperature 98.3 F (36.8 C), temperature source Oral, resp. rate 18, height '5\' 10"'$  (1.778 m), weight 57.2 kg, SpO2 98 %.        Intake/Output Summary (Last 24 hours) at 02/19/2022 0755 Last data filed at 02/19/2022 0749 Gross per 24 hour  Intake 1541.34 ml  Output 1025 ml  Net 516.34 ml   Filed Weights   02/18/22 0000 02/18/22 0605 02/19/22 0500  Weight: 59.7 kg 65.4 kg 57.2 kg   Examination:  General - alert, frail Eyes - pupils reactive ENT - no sinus tenderness, no stridor, slightly garbled speech Cardiac - regular rate/rhythm, no murmur Chest - equal breath sounds b/l, no wheezing or rales Abdomen - soft, non tender, + bowel sounds Extremities - no cyanosis, clubbing, or edema Skin - no rashes Neuro - normal strength, moves extremities, follows commands Psych - normal mood and behavior   Resolved Hospital Problem list    Assessment & Plan:   Hypotension likely from hypovolemia. - continue IV fluids - continue midodrine - continue midodrine   Fever with neutropenia with  presumed infectious diarrhea. Hx of CLL, Advanced cutaneous squamous cell carcinoma. 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 - report of blood culture from outside hospital being positive for E coli >> will need to confirm this - f/u repeat blood cultures done here - continue ABx - might need port removed - hematology/oncology consulted and granix ordered - f/u CBC with diff   Hx of chronic HFrEF, PAF. DM type 2. Hypothyroidism. Restless leg syndrome. BPH with prostate cancer. - per primary team   Severe protein calorie malnutrition. - optimize oral intake as able  PCCM will sign off.  Please call if additional assistance needed while he is in hospital.  Best Practice (right click and "Reselect all SmartList Selections" daily)   Diet/type: Regular consistency (see orders) DVT prophylaxis: DOAC GI prophylaxis: PPI Lines: N/A, R port accessed  Foley:  N/A Code Status:  DNR> vasopressors ok Last date of multidisciplinary goals of care discussion [per primary team]  Labs       Latest Ref Rng & Units 02/19/2022    3:25 AM 02/18/2022    2:32 AM 02/10/2022   12:00 AM  CMP  Glucose 70 - 99 mg/dL 80  108    BUN 8 - 23 mg/dL '22  28  21      '$ Creatinine 0.61 - 1.24 mg/dL 0.93  0.97  1.2      Sodium 135 - 145 mmol/L 136  138  136      Potassium 3.5 - 5.1  mmol/L 3.9  4.5  4.2      Chloride 98 - 111 mmol/L 108  109  107      CO2 22 - 32 mmol/L '23  22  23      '$ Calcium 8.9 - 10.3 mg/dL 7.7  8.3  8.6      Total Protein 6.5 - 8.1 g/dL 4.9  5.0  5.7      Total Bilirubin 0.3 - 1.2 mg/dL 0.5  0.6    Alkaline Phos 38 - 126 U/L 38  39  53      AST 15 - 41 U/L '31  29  19      '$ ALT 0 - 44 U/L 39  34  19         This result is from an external source.       Latest Ref Rng & Units 02/19/2022    3:25 AM 02/18/2022    9:16 AM 02/18/2022    2:32 AM  CBC  WBC 4.0 - 10.5 K/uL 27.2  21.3  20.5   Hemoglobin 13.0 - 17.0 g/dL 8.4  8.7  8.9   Hematocrit 39.0 - 52.0 % 27.5  28.0  29.2    Platelets 150 - 400 K/uL 183  196  200     CBG (last 3)  Recent Labs    02/18/22 1522 02/18/22 2118 02/19/22 0748  GLUCAP 268* 81 76    Signature:  Chesley Mires, MD [place] Pager - [phone], or [phone] 02/19/2022, 8:00 AM

## 2022-02-20 ENCOUNTER — Inpatient Hospital Stay (HOSPITAL_COMMUNITY): Payer: Medicare Other

## 2022-02-20 DIAGNOSIS — E039 Hypothyroidism, unspecified: Secondary | ICD-10-CM | POA: Diagnosis not present

## 2022-02-20 DIAGNOSIS — D709 Neutropenia, unspecified: Secondary | ICD-10-CM | POA: Diagnosis not present

## 2022-02-20 DIAGNOSIS — I10 Essential (primary) hypertension: Secondary | ICD-10-CM | POA: Diagnosis not present

## 2022-02-20 DIAGNOSIS — C9110 Chronic lymphocytic leukemia of B-cell type not having achieved remission: Secondary | ICD-10-CM | POA: Diagnosis not present

## 2022-02-20 LAB — COMPREHENSIVE METABOLIC PANEL
ALT: 32 U/L (ref 0–44)
AST: 22 U/L (ref 15–41)
Albumin: 2.1 g/dL — ABNORMAL LOW (ref 3.5–5.0)
Alkaline Phosphatase: 44 U/L (ref 38–126)
Anion gap: 5 (ref 5–15)
BUN: 19 mg/dL (ref 8–23)
CO2: 22 mmol/L (ref 22–32)
Calcium: 7.2 mg/dL — ABNORMAL LOW (ref 8.9–10.3)
Chloride: 112 mmol/L — ABNORMAL HIGH (ref 98–111)
Creatinine, Ser: 0.7 mg/dL (ref 0.61–1.24)
GFR, Estimated: >60 mL/min (ref >60)
Glucose, Bld: 87 mg/dL (ref 70–99)
Potassium: 4.2 mmol/L (ref 3.5–5.1)
Sodium: 139 mmol/L (ref 135–145)
Total Bilirubin: 0.4 mg/dL (ref 0.3–1.2)
Total Protein: 4.4 g/dL — ABNORMAL LOW (ref 6.5–8.1)

## 2022-02-20 LAB — CBC WITH DIFFERENTIAL/PLATELET
Abs Immature Granulocytes: 0.58 10*3/uL — ABNORMAL HIGH (ref 0.00–0.07)
Basophils Absolute: 0.2 10*3/uL — ABNORMAL HIGH (ref 0.0–0.1)
Basophils Relative: 1 %
Eosinophils Absolute: 0.1 10*3/uL (ref 0.0–0.5)
Eosinophils Relative: 0 %
HCT: 29.3 % — ABNORMAL LOW (ref 39.0–52.0)
Hemoglobin: 9 g/dL — ABNORMAL LOW (ref 13.0–17.0)
Immature Granulocytes: 2 %
Lymphocytes Relative: 82 %
Lymphs Abs: 25.2 10*3/uL — ABNORMAL HIGH (ref 0.7–4.0)
MCH: 29.5 pg (ref 26.0–34.0)
MCHC: 30.7 g/dL (ref 30.0–36.0)
MCV: 96.1 fL (ref 80.0–100.0)
Monocytes Absolute: 0.7 10*3/uL (ref 0.1–1.0)
Monocytes Relative: 2 %
Neutro Abs: 3.8 10*3/uL (ref 1.7–7.7)
Neutrophils Relative %: 13 %
Platelets: 218 10*3/uL (ref 150–400)
RBC: 3.05 MIL/uL — ABNORMAL LOW (ref 4.22–5.81)
RDW: 14.5 % (ref 11.5–15.5)
WBC: 30.5 10*3/uL — ABNORMAL HIGH (ref 4.0–10.5)
nRBC: 0.1 % (ref 0.0–0.2)

## 2022-02-20 LAB — GLUCOSE, CAPILLARY
Glucose-Capillary: 81 mg/dL (ref 70–99)
Glucose-Capillary: 210 mg/dL — ABNORMAL HIGH (ref 70–99)
Glucose-Capillary: 103 mg/dL — ABNORMAL HIGH (ref 70–99)
Glucose-Capillary: 120 mg/dL — ABNORMAL HIGH (ref 70–99)

## 2022-02-20 LAB — MAGNESIUM: Magnesium: 2 mg/dL (ref 1.7–2.4)

## 2022-02-20 LAB — PHOSPHORUS: Phosphorus: 2.9 mg/dL (ref 2.5–4.6)

## 2022-02-20 NOTE — Evaluation (Signed)
Physical Therapy Evaluation Patient Details Name: Fernando Young MRN: [id number] DOB: [birth year] Today's Date: 02/20/2022  History of Present Illness  Pt admitted from home with diarrhea and neutropenic fever in setting of enteropathic ecoli bacteremia.  Pt with hx of CHF, DM, HFrEF, PAF, GIB, carotid artery disease, and CLL (chemo currently on hold)  Clinical Impression  Pt admitted as above and presenting with functional mobility limitations 2* generalized weakness, poor endurance and balance deficits.  Pt currently requiring significant assist for all basic mobility tasks and is at high risk for falling.  Pt would benefit from follow up rehab at SNF level to maximize IND and safety prior to return home - pt reports family is very supportive but he does not currently have 24/7 assist.     Recommendations for follow up therapy are one component of a multi-disciplinary discharge planning process, led by the attending physician.  Recommendations may be updated based on patient status, additional functional criteria and insurance authorization.  Follow Up Recommendations Skilled nursing-short term rehab (<3 hours/day) Can patient physically be transported by private vehicle: No    Assistance Recommended at Discharge Frequent or constant Supervision/Assistance  Patient can return home with the following  A lot of help with walking and/or transfers;A little help with bathing/dressing/bathroom;Assistance with cooking/housework;Assist for transportation;Help with stairs or ramp for entrance    Equipment Recommendations None recommended by PT  Recommendations for Other Services       Functional Status Assessment Patient has had a recent decline in their functional status and demonstrates the ability to make significant improvements in function in a reasonable and predictable amount of time.     Precautions / Restrictions Precautions Precautions: Fall Restrictions Weight Bearing Restrictions:  No      Mobility  Bed Mobility Overal bed mobility: Needs Assistance Bed Mobility: Supine to Sit     Supine to sit: Min assist, +2 for safety/equipment     General bed mobility comments: Increased time with assist to bring trunk to upright and to complete rotation to EOB sitting using bed pad    Transfers Overall transfer level: Needs assistance Equipment used: Rolling walker (2 wheels) Transfers: Sit to/from Stand Sit to Stand: Min assist, +2 physical assistance, +2 safety/equipment, From elevated surface           General transfer comment: cues for use of UEs to self assist; physical assist to bring wt up and fwd and to balance in standing with RW    Ambulation/Gait Ambulation/Gait assistance: Min assist, Mod assist, +2 safety/equipment Gait Distance (Feet): 8 Feet Assistive device: Rolling walker (2 wheels) Gait Pattern/deviations: Step-to pattern, Decreased step length - right, Decreased step length - left, Shuffle, Trunk flexed, Wide base of support Gait velocity: decr     General Gait Details: cues for posture and position from RW; increased difficulty advancing L LE (pt states this is long term problem).  Distance ltd by fatigue  Stairs            Wheelchair Mobility    Modified Rankin (Stroke Patients Only)       Balance Overall balance assessment: Needs assistance Sitting-balance support: No upper extremity supported, Feet supported Sitting balance-Leahy Scale: Fair     Standing balance support: Bilateral upper extremity supported Standing balance-Leahy Scale: Poor                               Pertinent Vitals/Pain Pain Assessment Pain Assessment: No/denies  pain    Home Living Family/patient expects to be discharged to:: Private residence Living Arrangements: Other (Comment);Children (Son works days and home at night, dtr stays with him afternoons) Available Help at Discharge: Family;Available PRN/intermittently Type of  Home: House Home Access: Stairs to enter Entrance Stairs-Rails: Can reach both;Left;Right Entrance Stairs-Number of Steps: 2   Home Layout: One level Home Equipment: Rollator (4 wheels);Cane - single point      Prior Function Prior Level of Function : Independent/Modified Independent             Mobility Comments: reports walking outdoors with cane       Hand Dominance        Extremity/Trunk Assessment   Upper Extremity Assessment Upper Extremity Assessment: Defer to OT evaluation    Lower Extremity Assessment Lower Extremity Assessment: Generalized weakness    Cervical / Trunk Assessment Cervical / Trunk Assessment: Kyphotic  Communication   Communication: No difficulties  Cognition Arousal/Alertness: Awake/alert Behavior During Therapy: WFL for tasks assessed/performed Overall Cognitive Status: Within Functional Limits for tasks assessed                                          General Comments      Exercises     Assessment/Plan    PT Assessment Patient needs continued PT services  PT Problem List Decreased strength;Decreased activity tolerance;Decreased balance;Decreased mobility;Decreased knowledge of use of DME       PT Treatment Interventions DME instruction;Gait training;Stair training;Functional mobility training;Therapeutic activities;Therapeutic exercise;Balance training;Patient/family education    PT Goals (Current goals can be found in the Care Plan section)  Acute Rehab PT Goals Patient Stated Goal: REgain IND PT Goal Formulation: With patient Time For Goal Achievement: 03/06/22 Potential to Achieve Goals: Fair    Frequency Min 3X/week     Co-evaluation PT/OT/SLP Co-Evaluation/Treatment: Yes Reason for Co-Treatment: For patient/therapist safety;To address functional/ADL transfers PT goals addressed during session: Mobility/safety with mobility OT goals addressed during session: ADL's and self-care        AM-PAC PT "6 Clicks" Mobility  Outcome Measure Help needed turning from your back to your side while in a flat bed without using bedrails?: A Little Help needed moving from lying on your back to sitting on the side of a flat bed without using bedrails?: A Lot Help needed moving to and from a bed to a chair (including a wheelchair)?: A Lot Help needed standing up from a chair using your arms (e.g., wheelchair or bedside chair)?: A Lot Help needed to walk in hospital room?: A Lot Help needed climbing 3-5 steps with a railing? : Total 6 Click Score: 12    End of Session Equipment Utilized During Treatment: Gait belt Activity Tolerance: Patient limited by fatigue Patient left: in chair;with call bell/phone within reach;with chair alarm set Nurse Communication: Mobility status PT Visit Diagnosis: Unsteadiness on feet (R26.81);Muscle weakness (generalized) (M62.81);Difficulty in walking, not elsewhere classified (R26.2)    Time: 4097-3532 PT Time Calculation (min) (ACUTE ONLY): 32 min   Charges:   PT Evaluation $PT Eval Low Complexity: 1 Low          [place] [facility] Pager [phone] Office [phone]   Fernando Young 02/20/2022, 3:14 PM

## 2022-02-20 NOTE — Progress Notes (Signed)
PROGRESS NOTE    Fernando Young  KGU:[id number] DOB: March 03, 1941 DOA: 02/17/2022 PCP: Raelene Bott, MD   Brief Narrative:  The patient is a chronically ill appearing Caucasian male with a past medical history significant for but secondary to CLL, history of squamous cell carcinoma, history of chronic combined systolic and diastolic CHF, paroxysmal atrial fibrillation, BPH, hypertension, history of GI bleed, diabetes mellitus type 2, hypothyroidism, carotid artery disease, history of polyps, as well as prostate cancer and other comorbidities who presented with neutropenic fever and not feeling well.  He is normally seen in [place] and is getting chemotherapy which is currently being held due to ongoing issues with symptoms.  He was recently treated for enteropathic E. coli in the last month with improvement after admission and began to feel worse recently and noted some black stools which progressed last several days to diarrhea with coffee-ground appearance.  He also had decreased p.o. intake due to fear of diarrhea and subsequent worsening weakness.  Also reports some congestion and had fevers recorded as high as 100.8 at home.  Given him not feeling well he presented to the [facility] ED and there he had Initial blood pressure in the 62G systolic and he was given 500 cc bolus today with improvement.  Lab work there showed a chronic leukocytosis and his flu and COVID negative.  Chest x-ray showed increasing left lower lobe atelectasis versus airspace disease and blood cultures were obtained and grew out 1 out of 4 E. coli.  In the ED received cefepime, Flagyl and 500 cc of IV fluid and was transferred to [facility] and oncology has been consulted and following.  Cultures are being repeated here and he has been placed on IV antibiotics and de-escalated to IV ceftriaxone.  Given his persistent hypotension PCCM has been consulted for further evaluation given his significant CHF history with concern for volume  overload with too much fluid resuscitation.   Patient blood pressure improved after fluid boluses and ANC improved to.  Cultures are still pending in our system.  He has been de-escalated to IV ceftriaxone and PT OT is to further evaluate and treat and they are now recommending SNF.  Patient is improving so has been transferred out of the stepdown unit to the telemetry floor  Assessment and Plan: No notes have been filed under this hospital service. Service: Hospitalist  Diarrhea likely in the setting of enteropathic E. coli Neutropenic fever in the setting of E. coli bacteremia -Patient presenting with generally feeling unwell concern for black stools and diarrhea.  Also with congestion and fever as high as 100.8 at home.  Noted to have neutropenia with ANC of 0.1 on admission > In the setting of CLL as below. > Started vancomycin and cefepime on advice of oncology in ED at outside hospital but this has been changed to IV ceftriaxone given his E. coli bacteremia.  We will need to find out sensitivities of the E. coli from the [facility] ED but he seems to be improving > Initially some hypotension but this appears to have improved with 500 cc bolus remains persistently hypotensive. > Negative for flu COVID and RSV at outside hospital.  They have ordered C. difficile and GI pathogen panel which are pending due to his diarrhea.  Urine cultures and blood cultures are pending.  Chest x-ray also stated there was some increased atelectasis versus airspace disease of the left lower lobe. -Repeat cultures and blood cultures x2, urinalysis and urine culture ordered; blood cultures  x2 showing no growth to date so far at 2 days and urine culture is no growth -Chest x-ray done and showed "Linear opacity in the right lower lung most likely represents atelectasis, but in the setting of a neutropenic fever, infection is not entirely excluded. Consider further evaluation with CT chest for more definitive  characterization." - Oncology consulted and appreciate further evaluation recommendations -Urinalysis did not show any signs of infection -WBC has gone from 15.2 -> 19.3 -> 20.5 -> 21.3 -> 27.2 -> 30.5 - Continue to monitor on stepdown unit for now - Continue vancomycin and cefepime and this is just de-escalated to IV ceftriaxone - Trend fever curve and WBC/ANC; ANC is now 3.8 - Follow-up outside hospital urine culture and blood cultures - Follow-up outside hospital GI pathogen panel and C. difficile studies and this was negative -Given persistently low hypotension pulmonary critical care has been consulted for further evaluation recommendations but he is now improved and they have signed off the case -PT OT recommending SNF   Hypertension and now remains hypotensive still which is persistent, now improved > Initially hypotensive in the ED with blood pressures reportedly as low as the 40X systolic however this improved significantly with 500 cc bolus.  Outpatient blood pressures appear to be in the 80s to 100s in the past couple weeks per chart review. - Holding home Lasix and benazepril for now -Received total of 1750 mL boluses and his blood pressures improved -Added midodrine 5 mg p.o. 3 times daily and PCCM was consulted for his hypotension but his hypotension is now improved and resolved so they have signed off the case    CLL Squama cell carcinoma > History of squamous cell carcinoma had been on Erivedge but developed additional lesions and was started on,Comiplimab, but this was held due to worsening fatigue. > History of CLL, treatment initially held due to comorbidities and that he was started on acalabrutinib, however he developed GI bleeding this has been held. > Does follow with oncology in [place], [place] [facility]. - Oncology is aware of patient and they have started the patient on Granix given his neutropenic fever and recommending broad-spectrum antibiotics and he has not been just  de-escalated to IV ceftriaxone - Continue to monitor - Supportive care and continuing antibiotics as above and oncology has started the patient on Granix 300 mcg daily and recommending discontinuing once his ANC crosses 1000 and now it is 2300   Chronic combined systolic and diastolic CHF > Known history of CHF last echo was earlier this month with EF of 35%, G1 DD, normal RV function. - Holding home Lasix in the setting of hypotension during initial evaluation in the ED - Avoid excessive IV fluids and patient is + 985.8 mL -Continue to monitor and trend renal function and electrolytes and continue strict I's and O's and daily weights -Continue monitor for signs and symptoms of volume overload    Paroxysmal atrial fibrillation -Continue home amiodarone and Eliquis -Continue monitor on telemetry in the stepdown unit   History of GI bleed Dark stools Anemia likely chronic kidney disease versus cancer associated with iron deficiency anemia > No history of GI bleed this was a complication of chemotherapy in the past.  Family had some concerns about recurrent dark stools however hemoglobin remained stable. -His Eliquis has been resumed and his hemoglobin/hematocrit has remained relatively stable last few checks and has gone from 9.2/30.2 -> 8.9/29.2 -> 8.7/28.0 -> 8.4/27.5 -> 9.0/29.3 -Continue to monitor and transfuse for hemoglobin  less than 7 currently has no indication for PRBC transfusion - We will continue with PPI -Continue to monitor for signs and symptoms of bleeding -Repeat CBC in the a.m.   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 nightly and with an additional 0.5 mg p.o. nightly as needed if the  bedtime dose does not work   Carotid artery disease -Continue home Rosuvastatin 10 mg p.o. total -On Eliquis as above   History of BPH and prostate cancer - Noted we will need to continue to monitor strict I's and O's -Patient is +985.8 mL since admission   Severe Protein Calorie Malnutrition -Nutrition Status: Nutrition Problem: Severe Malnutrition Etiology: chronic illness Signs/Symptoms: severe fat depletion, severe muscle depletion Interventions: Liberalize Diet, Ensure Enlive (each supplement provides 350kcal and 20 grams of protein)   DVT prophylaxis:  apixaban (ELIQUIS) tablet 5 mg    Code Status: DNR Family Communication: No family currently at bedside  Disposition Plan:  Level of care: Telemetry Status is: Inpatient Remains inpatient appropriate because: PT OT recommending SNF and will need to be worked up for this with TOC.  Continues to improve with IV antibiotics   Consultants:  Oncology PCCM  Procedures:  Delineated as above  Antimicrobials:  Anti-infectives (From admission, onward)    Start     Dose/Rate Route Frequency Ordered Stop   02/18/22 2000  vancomycin (VANCOCIN) IVPB 1000 mg/200 mL premix  Status:  Discontinued        1,000 mg 200 mL/hr over 60 Minutes Intravenous Every 24 hours 02/18/22 0003 02/18/22 0704   02/18/22 2000  vancomycin (VANCOREADY) IVPB 1250 mg/250 mL  Status:  Discontinued        1,250 mg 166.7 mL/hr over 90 Minutes Intravenous Every 24 hours 02/18/22 0704 02/18/22 0705   02/18/22 2000  vancomycin (VANCOCIN) IVPB 1000 mg/200 mL premix  Status:  Discontinued        1,000 mg 200 mL/hr over 60 Minutes Intravenous Every 24 hours 02/18/22 0705 02/18/22 0956   02/18/22 1800  cefTRIAXone (ROCEPHIN) 2 g in sodium chloride 0.9 % 100 mL IVPB        2 g 200 mL/hr over 30 Minutes Intravenous Every 24 hours 02/18/22 0956     02/18/22 0600  ceFEPIme (MAXIPIME) 2 g in sodium chloride 0.9 % 100 mL IVPB  Status:  Discontinued        2 g 200  mL/hr over 30 Minutes Intravenous Every 12 hours 02/18/22 0003 02/18/22 0956       Subjective: Seen and examined at bedside and he was doing better today.  More awake and alert.  Denies any complaints of pain.  States that his diarrhea is improved.  No other concerns or complaints at this time and eating his breakfast without issues.  Objective: Vitals:   02/20/22 1200 02/20/22 1228 02/20/22 1300 02/20/22 1400  BP: (!) 94/44  (!) 125/56 (!) 116/54  Pulse: 65  67 61  Resp: '18  17 17  '$ Temp: (!) 97.5 F (36.4 C) 97.8 F (36.6 C)    TempSrc: Oral Oral    SpO2: 98%  97% 98%  Weight:      Height:        Intake/Output Summary (Last 24 hours)  at 02/20/2022 1538 Last data filed at 02/20/2022 1347 Gross per 24 hour  Intake 1254.32 ml  Output 1150 ml  Net 104.32 ml   Filed Weights   02/18/22 0000 02/18/22 0605 02/19/22 0500  Weight: 59.7 kg 65.4 kg 57.2 kg   Examination: Physical Exam:  Constitutional: Thin elderly chronically ill-appearing Caucasian male currently no acute distress appears calm Respiratory: Diminished to auscultation bilaterally, no wheezing, rales, rhonchi or crackles. Normal respiratory effort and patient is not tachypenic. No accessory muscle use.  Unlabored breathing Cardiovascular: RRR, no murmurs / rubs / gallops. S1 and S2 auscultated. No extremity edema Abdomen: Soft, non-tender, non-distended.  Bowel sounds positive.  GU: Deferred. Musculoskeletal: No clubbing / cyanosis of digits/nails. No joint deformity upper and lower extremities.  Skin: No rashes, lesions, ulcers limited skin evaluation. No induration; Warm and dry.  Neurologic: CN 2-12 grossly intact with no focal deficits. Romberg sign and cerebellar reflexes not assessed.  Psychiatric: Normal judgment and insight. Alert and oriented x 3. Normal mood and appropriate affect.   Data Reviewed: I have personally reviewed following labs and imaging studies  CBC: Recent Labs  Lab 02/17/22 2339  02/18/22 0232 02/18/22 0916 02/19/22 0325 02/20/22 0540  WBC 19.3* 20.5* 21.3* 27.2* 30.5*  NEUTROABS  --   --  0.3* 2.3 3.8  HGB 9.2* 8.9* 8.7* 8.4* 9.0*  HCT 30.2* 29.2* 28.0* 27.5* 29.3*  MCV 99.7 98.6 96.2 96.8 96.1  PLT 190 200 196 183 259   Basic Metabolic Panel: Recent Labs  Lab 02/18/22 0232 02/19/22 0325 02/20/22 0540  NA 138 136 139  K 4.5 3.9 4.2  CL 109 108 112*  CO2 '22 23 22  '$ GLUCOSE 108* 80 87  BUN 28* 22 19  CREATININE 0.97 0.93 0.70  CALCIUM 8.3* 7.7* 7.2*  MG  --  1.7 2.0  PHOS  --  3.2 2.9   GFR: Estimated Creatinine Clearance: 56.6 mL/min (by C-G formula based on SCr of 0.7 mg/dL). Liver Function Tests: Recent Labs  Lab 02/18/22 0232 02/19/22 0325 02/20/22 0540  AST '29 31 22  '$ ALT 34 39 32  ALKPHOS 39 38 44  BILITOT 0.6 0.5 0.4  PROT 5.0* 4.9* 4.4*  ALBUMIN 2.5* 2.2* 2.1*   No results for input(s): "LIPASE", "AMYLASE" in the last 168 hours. No results for input(s): "AMMONIA" in the last 168 hours. Coagulation Profile: No results for input(s): "INR", "PROTIME" in the last 168 hours. Cardiac Enzymes: No results for input(s): "CKTOTAL", "CKMB", "CKMBINDEX", "TROPONINI" in the last 168 hours. BNP (last 3 results) No results for input(s): "PROBNP" in the last 8760 hours. HbA1C: No results for input(s): "HGBA1C" in the last 72 hours. CBG: Recent Labs  Lab 02/19/22 1138 02/19/22 1644 02/19/22 2105 02/20/22 0748 02/20/22 1139  GLUCAP 98 127* 172* 81 210*   Lipid Profile: No results for input(s): "CHOL", "HDL", "LDLCALC", "TRIG", "CHOLHDL", "LDLDIRECT" in the last 72 hours. Thyroid Function Tests: No results for input(s): "TSH", "T4TOTAL", "FREET4", "T3FREE", "THYROIDAB" in the last 72 hours. Anemia Panel: No results for input(s): "VITAMINB12", "FOLATE", "FERRITIN", "TIBC", "IRON", "RETICCTPCT" in the last 72 hours. Sepsis Labs: No results for input(s): "PROCALCITON", "LATICACIDVEN" in the last 168 hours.  Recent Results (from the past  240 hour(s))  Culture, blood (Routine X 2) w Reflex to ID Panel     Status: None (Preliminary result)   Collection Time: 02/18/22  9:16 AM   Specimen: Right Antecubital; Blood  Result Value Ref Range Status   Specimen Description  Final    RIGHT ANTECUBITAL BLOOD Performed at [facility], [place] [address] [place], [place] [address]    Special Requests   Final    BOTTLES DRAWN AEROBIC ONLY Blood Culture adequate volume Performed at [place] [address] [place], [place] [address]    Culture   Final    NO GROWTH 2 DAYS Performed at [place] [address], [place], [place] [address]    Report Status PENDING  Incomplete  Culture, blood (Routine X 2) w Reflex to ID Panel     Status: None (Preliminary result)   Collection Time: 02/18/22  9:16 AM   Specimen: Left Antecubital; Blood  Result Value Ref Range Status   Specimen Description   Final    LEFT ANTECUBITAL BLOOD Performed at [facility], [place] [address] [place], [place] [address]    Special Requests   Final    BOTTLES DRAWN AEROBIC ONLY Blood Culture adequate volume Performed at [place] [address] [place], [place] [address]    Culture   Final    NO GROWTH 2 DAYS Performed at [place] [address], [place], [place] [address]    Report Status PENDING  Incomplete  Urine Culture     Status: None   Collection Time: 02/18/22 11:10 AM   Specimen: Urine, Clean Catch  Result Value Ref Range Status   Specimen Description   Final    URINE, CLEAN CATCH Performed at [facility], [place] [address] [place], [place] [address]    Special Requests   Final    Immunocompromised Performed at [facility], [place] [address], [place], [place] [address]    Culture   Final    NO GROWTH Performed at [facility], [place] [address], [place], [place] [address]    Report Status 02/19/2022 FINAL  Final  MRSA Next  Gen by PCR, Nasal     Status: Abnormal   Collection Time: 02/18/22  7:30 PM   Specimen: Nasal Mucosa; Nasal Swab  Result Value Ref Range Status   MRSA by PCR Next Gen DETECTED (A) NOT DETECTED Final    Comment: RESULT CALLED TO, READ BACK BY AND VERIFIED WITH: GARLAND,J @ [id number] JMK (NOTE) The GeneXpert MRSA Assay (FDA approved for NASAL specimens only), is one component of a comprehensive MRSA colonization surveillance program. It is not intended to diagnose MRSA infection nor to guide or monitor treatment for MRSA infections. Test performance is not FDA approved in patients less than 68 years old. Performed at [facility], [place] [address], [place], [place] [address]     Radiology Studies: DG CHEST PORT 1 VIEW  Result Date: 02/20/2022 CLINICAL DATA:  [id number] with shortness of breath. EXAM: PORTABLE CHEST 1 VIEW COMPARISON:  Portable chest yesterday at 6:19 a.m. FINDINGS: There is mild-to-moderate layering bilateral pleural effusions with overlying lung opacities which could be atelectasis or pneumonia. Right perihilar linear atelectasis is unchanged with remaining lungs clear of focal opacity. The heart is enlarged. There is mild central vascular prominence without overt edema. Stable mediastinum with aortic calcifications and tortuosity. Right IJ port catheter tip remains at about the cavoatrial junction. Degenerative change again noted both shoulders. IMPRESSION: Overall aeration is unchanged.  No new or worsening abnormality. There are persistent pleural effusions and overlying atelectasis or consolidation in the lung bases as well as mild central vascular fullness without overt edema.  Stable cardiomegaly. Electronically Signed   By: Telford Nab M.D.   On: 02/20/2022 07:44   DG CHEST PORT 1 VIEW  Result Date: 02/19/2022 CLINICAL DATA:  SOB EXAM: PORTABLE CHEST - 1 VIEW COMPARISON:  02/18/2022 FINDINGS: Stable right IJ port catheter to the distal SVC. Linear  atelectasis or scarring in the right mid lung slightly more conspicuous. Ill-defined interstitial infiltrates laterally in the right mid lung and in the lung bases left greater than right are basically stable. Heart size and mediastinal contours are within normal limits. No pneumothorax. Blunting of right lateral costophrenic angle possibly positional. Visualized bones unremarkable. IMPRESSION: Persistent bilateral interstitial opacities, left greater than right. Electronically Signed   By: Lucrezia Europe M.D.   On: 02/19/2022 08:00    Scheduled Meds:  amiodarone  200 mg Oral Daily   apixaban  5 mg Oral BID   Chlorhexidine Gluconate Cloth  6 each Topical Daily   Chlorhexidine Gluconate Cloth  6 each Topical Q0600   insulin aspart  0-9 Units Subcutaneous TID WC   levothyroxine  75 mcg Oral Q0600   midodrine  5 mg Oral TID WC   mupirocin ointment  1 Application Nasal BID   pantoprazole  40 mg Oral Daily   rOPINIRole  0.5 mg Oral QHS   rosuvastatin  10 mg Oral Daily   sodium chloride flush  3 mL Intravenous Q12H   Continuous Infusions:  sodium chloride 50 mL/hr at 02/20/22 1347   cefTRIAXone (ROCEPHIN)  IV Stopped (02/19/22 1724)    LOS: 3 days   Raiford Noble, DO [facility] Available via Epic secure chat 7am-7pm After these hours, please refer to coverage provider listed on [email or web address] 02/20/2022, 3:38 PM

## 2022-02-20 NOTE — Evaluation (Signed)
Occupational Therapy Evaluation Patient Details Name: Fernando Young MRN: [id number] DOB: [birth year] Today's Date: 02/20/2022   History of Present Illness Pt admitted from home with diarrhea and neutropenic fever in setting of enteropathic ecoli bacteremia.  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 Pt currently with functional limitations due to the deficits listed below (see OT Problem List).  Pt will benefit from skilled OT to increase their safety and independence with ADL and functional mobility for ADL to facilitate discharge to venue listed below.        Recommendations for follow up therapy are one component of a multi-disciplinary discharge planning process, led by the attending physician.  Recommendations may be updated based on patient status, additional functional criteria and insurance authorization.   Follow Up Recommendations  Acute inpatient rehab (3hours/day)    Assistance Recommended at Discharge PRN  Patient can return home with the following A little help with bathing/dressing/bathroom;Assistance with cooking/housework    Functional Status Assessment  Patient has had a recent decline in their functional status and demonstrates the ability to make significant improvements in function in a reasonable and predictable amount of time.  Equipment Recommendations  None recommended by OT    Recommendations for Other Services Rehab consult     Precautions / Restrictions Precautions Precautions: Fall Restrictions Weight Bearing Restrictions: No      Mobility Bed Mobility Overal bed mobility: Needs Assistance Bed Mobility: Supine to Sit     Supine to sit: Min assist, +2 for safety/equipment     General bed mobility comments: Increased time with assist to bring trunk to upright and to complete rotation to EOB sitting using bed pad    Transfers Overall  transfer level: Needs assistance Equipment used: Rolling walker (2 wheels) Transfers: Sit to/from Stand Sit to Stand: Min assist, +2 physical assistance, +2 safety/equipment, From elevated surface           General transfer comment: cues for use of UEs to self assist; physical assist to bring wt up and fwd and to balance in standing with RW          ADL either performed or assessed with clinical judgement   ADL Overall ADL's : Needs assistance/impaired Eating/Feeding: Minimal assistance;Sitting   Grooming: Minimal assistance;Sitting   Upper Body Bathing: Moderate assistance;Sitting   Lower Body Bathing: Maximal assistance;Sit to/from stand;Cueing for safety;Cueing for sequencing   Upper Body Dressing : Minimal assistance;Sitting   Lower Body Dressing: Maximal assistance;Sit to/from stand;Cueing for safety                 General ADL Comments: Pt very kind and motivated.  Pt has good family support     Vision Patient Visual Report: No change from baseline              Pertinent Vitals/Pain Pain Assessment Pain Assessment: No/denies pain     Hand Dominance     Extremity/Trunk Assessment Upper Extremity Assessment Upper Extremity Assessment: Generalized weakness   Lower Extremity Assessment Lower Extremity Assessment: Generalized weakness   Cervical / Trunk Assessment Cervical / Trunk Assessment: Kyphotic   Communication Communication Communication: No difficulties   Cognition Arousal/Alertness: Awake/alert Behavior During Therapy: WFL for tasks assessed/performed Overall Cognitive Status: Within Functional Limits for tasks assessed  Home Living Family/patient expects to be discharged to:: Private residence Living Arrangements: Other (Comment);Children (Son works days and home at night, dtr stays with him afternoons) Available Help at Discharge: Family;Available  PRN/intermittently Type of Home: House Home Access: Stairs to enter [employer] of Steps: 2 Entrance Stairs-Rails: Can reach both;Left;Right Home Layout: One level         Bathroom Toilet: Standard     Home Equipment: Rollator (4 wheels);Cane - single point          Prior Functioning/Environment Prior Level of Function : Independent/Modified Independent             Mobility Comments: reports walking outdoors with cane          OT Problem List: Decreased strength;Impaired balance (sitting and/or standing);Decreased activity tolerance      OT Treatment/Interventions: Self-care/ADL training;DME and/or AE instruction;Therapeutic activities;Therapeutic exercise;Patient/family education    OT Goals(Current goals can be found in the care plan section) Acute Rehab OT Goals Patient Stated Goal: get stronger OT Goal Formulation: With patient Time For Goal Achievement: 03/06/22 Potential to Achieve Goals: Good  OT Frequency:      Co-evaluation PT/OT/SLP Co-Evaluation/Treatment: Yes Reason for Co-Treatment: For patient/therapist safety;To address functional/ADL transfers PT goals addressed during session: Mobility/safety with mobility OT goals addressed during session: ADL's and self-care      AM-PAC OT "6 Clicks" Daily Activity     Outcome Measure Help from another person eating meals?: A Little Help from another person taking care of personal grooming?: A Little Help from another person toileting, which includes using toliet, bedpan, or urinal?: A Lot Help from another person bathing (including washing, rinsing, drying)?: A Lot Help from another person to put on and taking off regular upper body clothing?: A Little Help from another person to put on and taking off regular lower body clothing?: A Lot 6 Click Score: 15   End of Session Equipment Utilized During Treatment: Rolling walker (2 wheels);Gait belt Nurse Communication: Mobility status  Activity  Tolerance: Patient tolerated treatment well Patient left: in chair;with call bell/phone within reach  OT Visit Diagnosis: Unsteadiness on feet (R26.81);Muscle weakness (generalized) (M62.81)                Time: 6333-5456 OT Time Calculation (min): 31 min Charges:  OT General Charges $OT Visit: 1 Visit OT Evaluation $OT Eval Moderate Complexity: [place], [place]  Office609 [phone], Edwena Felty D 02/20/2022, 3:51 PM

## 2022-02-21 DIAGNOSIS — E039 Hypothyroidism, unspecified: Secondary | ICD-10-CM | POA: Diagnosis not present

## 2022-02-21 DIAGNOSIS — I10 Essential (primary) hypertension: Secondary | ICD-10-CM | POA: Diagnosis not present

## 2022-02-21 DIAGNOSIS — D709 Neutropenia, unspecified: Secondary | ICD-10-CM | POA: Diagnosis not present

## 2022-02-21 DIAGNOSIS — C9110 Chronic lymphocytic leukemia of B-cell type not having achieved remission: Secondary | ICD-10-CM | POA: Diagnosis not present

## 2022-02-21 LAB — GLUCOSE, CAPILLARY
Glucose-Capillary: 105 mg/dL — ABNORMAL HIGH (ref 70–99)
Glucose-Capillary: 235 mg/dL — ABNORMAL HIGH (ref 70–99)
Glucose-Capillary: 92 mg/dL (ref 70–99)
Glucose-Capillary: 110 mg/dL — ABNORMAL HIGH (ref 70–99)

## 2022-02-21 LAB — CBC WITH DIFFERENTIAL/PLATELET
Abs Immature Granulocytes: 0.5 10*3/uL — ABNORMAL HIGH (ref 0.00–0.07)
Basophils Absolute: 0.1 10*3/uL (ref 0.0–0.1)
Basophils Relative: 0 %
Eosinophils Absolute: 0.1 10*3/uL (ref 0.0–0.5)
Eosinophils Relative: 0 %
HCT: 30.4 % — ABNORMAL LOW (ref 39.0–52.0)
Hemoglobin: 9.2 g/dL — ABNORMAL LOW (ref 13.0–17.0)
Immature Granulocytes: 2 %
Lymphocytes Relative: 85 %
Lymphs Abs: 23.8 10*3/uL — ABNORMAL HIGH (ref 0.7–4.0)
MCH: 29.4 pg (ref 26.0–34.0)
MCHC: 30.3 g/dL (ref 30.0–36.0)
MCV: 97.1 fL (ref 80.0–100.0)
Monocytes Absolute: 0.6 10*3/uL (ref 0.1–1.0)
Monocytes Relative: 2 %
Neutro Abs: 3.2 10*3/uL (ref 1.7–7.7)
Neutrophils Relative %: 11 %
Platelets: 235 10*3/uL (ref 150–400)
RBC: 3.13 MIL/uL — ABNORMAL LOW (ref 4.22–5.81)
RDW: 14.4 % (ref 11.5–15.5)
WBC Morphology: ABNORMAL
WBC: 28.1 10*3/uL — ABNORMAL HIGH (ref 4.0–10.5)
nRBC: 0 % (ref 0.0–0.2)

## 2022-02-21 LAB — COMPREHENSIVE METABOLIC PANEL
ALT: 30 U/L (ref 0–44)
AST: 22 U/L (ref 15–41)
Albumin: 2.3 g/dL — ABNORMAL LOW (ref 3.5–5.0)
Alkaline Phosphatase: 47 U/L (ref 38–126)
Anion gap: 5 (ref 5–15)
BUN: 18 mg/dL (ref 8–23)
CO2: 23 mmol/L (ref 22–32)
Calcium: 7.6 mg/dL — ABNORMAL LOW (ref 8.9–10.3)
Chloride: 110 mmol/L (ref 98–111)
Creatinine, Ser: 0.82 mg/dL (ref 0.61–1.24)
GFR, Estimated: >60 mL/min (ref >60)
Glucose, Bld: 91 mg/dL (ref 70–99)
Potassium: 4.4 mmol/L (ref 3.5–5.1)
Sodium: 138 mmol/L (ref 135–145)
Total Bilirubin: 0.4 mg/dL (ref 0.3–1.2)
Total Protein: 4.9 g/dL — ABNORMAL LOW (ref 6.5–8.1)

## 2022-02-21 LAB — MAGNESIUM: Magnesium: 1.9 mg/dL (ref 1.7–2.4)

## 2022-02-21 LAB — PHOSPHORUS: Phosphorus: 3.5 mg/dL (ref 2.5–4.6)

## 2022-02-21 NOTE — Progress Notes (Signed)
Hematology-Oncology Labs and chart reviewed Neutropenic fever: Resolved with Granix injections.  These have been discontinued.  E. coli bacteremia CLL: Previously treated with acalabrutinib.  Remarkable improvement in the white blood cell count.  He may be able to continue this as an outpatient. Generalized weakness: Patient likely to go to a skilled nursing facility. Hypotension: Significantly better    02/21/2022    3:13 AM 02/21/2022   12:09 AM 02/20/2022    9:03 PM  Vitals with BMI  Systolic [phone]  Diastolic 72 72 66  Pulse 61 64 65  5.  Anemia     Latest Ref Rng & Units 02/21/2022    5:00 AM 02/20/2022    5:40 AM 02/19/2022    3:25 AM  CBC  WBC 4.0 - 10.5 K/uL 28.1  30.5  27.2   Hemoglobin 13.0 - 17.0 g/dL 9.2  9.0  8.4   Hematocrit 39.0 - 52.0 % 30.4  29.3  27.5   Platelets 150 - 400 K/uL 235  218  183      Upon discharge patient will be followed by Dr. Anabel Bene at [place].

## 2022-02-21 NOTE — Progress Notes (Signed)
Rectal tube removed due to formed stools with no residual in foley bag.

## 2022-02-21 NOTE — Progress Notes (Signed)
PROGRESS NOTE    Fernando Young  XBL:[id number] DOB: 09-25-1940 DOA: 02/17/2022 PCP: Raelene Bott, MD   Brief Narrative:  The patient is a chronically ill appearing Caucasian male with a past medical history significant for but secondary to CLL, history of squamous cell carcinoma, history of chronic combined systolic and diastolic CHF, paroxysmal atrial fibrillation, BPH, hypertension, history of GI bleed, diabetes mellitus type 2, hypothyroidism, carotid artery disease, history of polyps, as well as prostate cancer and other comorbidities who presented with neutropenic fever and not feeling well.  He is normally seen in [place] and is getting chemotherapy which is currently being held due to ongoing issues with symptoms.  He was recently treated for enteropathic E. coli in the last month with improvement after admission and began to feel worse recently and noted some black stools which progressed last several days to diarrhea with coffee-ground appearance.  He also had decreased p.o. intake due to fear of diarrhea and subsequent worsening weakness.  Also reports some congestion and had fevers recorded as high as 100.8 at home.  Given him not feeling well he presented to the [facility] ED and there he had Initial blood pressure in the 30Q systolic and he was given 500 cc bolus today with improvement.  Lab work there showed a chronic leukocytosis and his flu and COVID negative.  Chest x-ray showed increasing left lower lobe atelectasis versus airspace disease and blood cultures were obtained and grew out 1 out of 4 E. coli.  In the ED received cefepime, Flagyl and 500 cc of IV fluid and was transferred to [facility] and oncology has been consulted and following.  Cultures are being repeated here and he has been placed on IV antibiotics and de-escalated to IV ceftriaxone.  Given his persistent hypotension PCCM has been consulted for further evaluation given his significant CHF history with concern for volume  overload with too much fluid resuscitation.   Patient blood pressure improved after fluid boluses and ANC improved to.  Cultures are still pending in our system.  He has been de-escalated to IV ceftriaxone and PT OT is to further evaluate and treat and they are now recommending SNF.  Patient is improving so has been transferred out of the stepdown unit to the telemetry floor  Assessment and Plan: No notes have been filed under this hospital service. Service: Hospitalist  Recent Diarrhea likely in the setting of enteropathic E. Coli, improved Neutropenic fever in the setting of E. coli bacteremia -Patient presenting with generally feeling unwell concern for black stools and diarrhea.  Also with congestion and fever as high as 100.8 at home.  Noted to have neutropenia with ANC of 0.1 on admission > In the setting of CLL as below. > Started vancomycin and cefepime on advice of oncology in ED at outside hospital but this has been changed to IV ceftriaxone given his E. coli bacteremia.  Sensitivities from [facility] laboratory have been obtained and it was sensitive to cefepime, ceftazidime, ceftriaxone, ertapenem and gentamicin but resistant to everything else; will continue IV ceftriaxone for now and then change to p.o. cefadroxil 1 gram po BID to complete 10 days > Initially some hypotension but this appears to have improved with 500 cc bolus remains persistently hypotensive. > Negative for flu COVID and RSV at outside hospital.  They have ordered C. difficile and GI pathogen panel which are pending due to his diarrhea.  Urine cultures and blood cultures are pending.  Chest x-ray also stated there  was some increased atelectasis versus airspace disease of the left lower lobe. -Repeat cultures and blood cultures x2, urinalysis and urine culture ordered; blood cultures x2 showing no growth to date so far at 3 days and urine culture is no growth -Chest x-ray done and showed "Linear opacity in the  right lower lung most likely represents atelectasis, but in the setting of a neutropenic fever, infection is not entirely excluded. Consider further evaluation with CT chest for more definitive characterization." - Oncology consulted and appreciate further evaluation recommendations -Urinalysis did not show any signs of infection -WBC has gone from 15.2 -> 19.3 -> 20.5 -> 21.3 -> 27.2 -> 30.5 and is now 28.1 -IVF with NS at 50 mL/hr now stopped -Stable to transfer from the stepdown unit to the medical floor - Continue vancomycin and cefepime and this is just de-escalated to IV ceftriaxone - Trend fever curve and WBC/ANC; ANC is now 3.2 - Follow-up outside hospital urine culture and blood cultures - Follow-up outside hospital GI pathogen panel and C. difficile studies and this was negative -Given persistently low hypotension pulmonary critical care has been consulted for further evaluation recommendations but he is now improved and they have signed off the case -PT OT recommending SNF and patient and daughter agreeable and TOC Consulted for assistance with placement   Hypertension and now remains hypotensive still which is persistent, now improved > Initially hypotensive in the ED with blood pressures reportedly as low as the 56P systolic however this improved significantly with 500 cc bolus.  Outpatient blood pressures appear to be in the 80s to 100s in the past couple weeks per chart review. - Holding home Lasix and benazepril for now -Received total of 1750 mL boluses and his blood pressures improved -Added midodrine 5 mg p.o. 3 times daily and PCCM was consulted for his hypotension but his hypotension is now improved and resolved so they have signed off the case    CLL Squama Cell Carcinoma > History of squamous cell carcinoma had been on Erivedge but developed additional lesions and was started on,Comiplimab, but this was held due to worsening fatigue. > History of CLL, treatment initially  held due to comorbidities and that he was started on acalabrutinib, however he developed GI bleeding this has been held. > Does follow with oncology in [place], [place] [facility]. - Oncology is aware of patient and they have started the patient on Granix given his neutropenic fever and recommending broad-spectrum antibiotics and he has not been just de-escalated to IV ceftriaxone given his sensitivity - Continue to monitor - Supportive care and continuing antibiotics as above and oncology has started the patient on Granix 300 mcg daily and recommending discontinuing once his ANC crosses 1000 and now it is 3200   Chronic combined systolic and diastolic CHF > Known history of CHF last echo was earlier this month with EF of 35%, G1 DD, normal RV function. - Holding home Lasix in the setting of hypotension during initial evaluation in the ED -Will stop iVF with NS at 50 mL/hr now  - Avoid excessive IV fluids and patient is + 1225.8 mL -Continue to monitor and trend renal function and electrolytes and continue strict I's and O's and daily weights -Continue monitor for signs and symptoms of volume overload    Paroxysmal atrial fibrillation -Continue home Amiodarone 200 mg po Daily and Apixaban 5 mg po BID -Continue monitor on telemetry in the stepdown unit   History of GI bleed Dark stools, improving  Anemia  likely chronic kidney disease versus cancer associated with iron deficiency anemia > No history of GI bleed this was a complication of chemotherapy in the past.  Family had some concerns about recurrent dark stools however hemoglobin remained stable. -His Eliquis has been resumed and his hemoglobin/hematocrit has remained relatively stable last few checks and has gone from 9.2/30.2 -> 8.9/29.2 -> 8.7/28.0 -> 8.4/27.5 -> 9.0/29.3 -> 9.2/30.4 -Continue to monitor and transfuse for hemoglobin less than 7 currently has no indication for PRBC transfusion -We will continue with PPI -Continue to monitor  for signs and symptoms of bleeding -Repeat CBC in the a.m.   Diabetes Mellitus Type 2 -Continue sensitive NovoLog sliding scale AC -CBGs ranging from 105-235 -Continue monitor blood sugars carefully and if necessary will make further adjustments   Hypothyroidism -Continue home Levothyroxine 75 mcg po Daily  -Recent TSH was 1.543 and T4 was 8.3   RLS - Continue home ropinirole with 0.5 mg p.o. nightly and with an additional 0.5 mg p.o. nightly as needed if the bedtime dose does not work   Carotid artery disease -Continue home Rosuvastatin 10 mg p.o. total -On Apixaban 5 mg po BID as above   History of BPH and prostate cancer - Noted we will need to continue to monitor strict I's and O's -Patient is +1225.8 mL since admission   Severe Protein Calorie Malnutrition -Nutrition Status: Nutrition Problem: Severe Malnutrition Etiology: chronic illness Signs/Symptoms: severe fat depletion, severe muscle depletion Interventions: Liberalize Diet, Ensure Enlive (each supplement provides 350kcal and 20 grams of protein)  DVT prophylaxis:  apixaban (ELIQUIS) tablet 5 mg    Code Status: DNR Family Communication: Discussed with Daughter at bedside  Disposition Plan:  Level of care: Telemetry Status is: Inpatient Remains inpatient appropriate because: Patient is doing better today and denies any bloody stools anymore and states his diarrhea is improved.  Feels overall stable and denies any chest pain or shortness of breath.  Still pretty weak and agreeable to SNF.  No other concerns at this time.   Consultants:  Medical oncology PCCM pulmonary  Procedures:  As above  Antimicrobials:  Anti-infectives (From admission, onward)    Start     Dose/Rate Route Frequency Ordered Stop   02/18/22 2000  vancomycin (VANCOCIN) IVPB 1000 mg/200 mL premix  Status:  Discontinued        1,000 mg 200 mL/hr over 60 Minutes Intravenous Every 24 hours 02/18/22 0003 02/18/22 0704   02/18/22 2000   vancomycin (VANCOREADY) IVPB 1250 mg/250 mL  Status:  Discontinued        1,250 mg 166.7 mL/hr over 90 Minutes Intravenous Every 24 hours 02/18/22 0704 02/18/22 0705   02/18/22 2000  vancomycin (VANCOCIN) IVPB 1000 mg/200 mL premix  Status:  Discontinued        1,000 mg 200 mL/hr over 60 Minutes Intravenous Every 24 hours 02/18/22 0705 02/18/22 0956   02/18/22 1800  cefTRIAXone (ROCEPHIN) 2 g in sodium chloride 0.9 % 100 mL IVPB        2 g 200 mL/hr over 30 Minutes Intravenous Every 24 hours 02/18/22 0956     02/18/22 0600  ceFEPIme (MAXIPIME) 2 g in sodium chloride 0.9 % 100 mL IVPB  Status:  Discontinued        2 g 200 mL/hr over 30 Minutes Intravenous Every 12 hours 02/18/22 0003 02/18/22 0956       Subjective: Seen and examined at bedside and has done fairly well.  Denies chest pain or shortness of  breath.  Denies any real abdominal discomfort.  No lightheadedness or dizziness but still feels weak.  Objective: Vitals:   02/20/22 1400 02/20/22 2103 02/21/22 0009 02/21/22 0313  BP: (!) 116/54 110/66 114/72 117/72  Pulse: 61 65 64 61  Resp: '17 18 18 18  '$ Temp:  98 F (36.7 C) 98.9 F (37.2 C) 98.5 F (36.9 C)  TempSrc:  Oral Oral Oral  SpO2: 98% 100% 98% 97%  Weight:      Height:        Intake/Output Summary (Last 24 hours) at 02/21/2022 1257 Last data filed at 02/21/2022 0900 Gross per 24 hour  Intake 355.83 ml  Output 300 ml  Net 55.83 ml   Filed Weights   02/18/22 0000 02/18/22 0605 02/19/22 0500  Weight: 59.7 kg 65.4 kg 57.2 kg   Examination: Physical Exam:  Constitutional: Thin elderly chronically ill-appearing Caucasian male currently no acute distress appears calm Respiratory: Diminished to auscultation bilaterally with coarse breath sounds, no wheezing, rales, rhonchi or crackles. Normal respiratory effort and patient is not tachypenic. No accessory muscle use.  Unlabored breathing Cardiovascular: RRR, no murmurs / rubs / gallops. S1 and S2 auscultated. No  extremity edema.  Abdomen: Soft, non-tender, non-distended.  Bowel sounds positive.  GU: Deferred. Musculoskeletal: No clubbing / cyanosis of digits/nails. No joint deformity upper and lower extremities.  Skin: Has multiple skin lesions noted throughout his body.  Neurologic: CN 2-12 grossly intact with no focal deficits. Romberg sign and cerebellar reflexes not assessed.  Psychiatric: Normal judgment and insight. Alert and oriented x 3. Normal mood and appropriate affect.   Data Reviewed: I have personally reviewed following labs and imaging studies  CBC: Recent Labs  Lab 02/18/22 0232 02/18/22 0916 02/19/22 0325 02/20/22 0540 02/21/22 0500  WBC 20.5* 21.3* 27.2* 30.5* 28.1*  NEUTROABS  --  0.3* 2.3 3.8 3.2  HGB 8.9* 8.7* 8.4* 9.0* 9.2*  HCT 29.2* 28.0* 27.5* 29.3* 30.4*  MCV 98.6 96.2 96.8 96.1 97.1  PLT 200 196 183 218 161   Basic Metabolic Panel: Recent Labs  Lab 02/18/22 0232 02/19/22 0325 02/20/22 0540 02/21/22 0500  NA 138 136 139 138  K 4.5 3.9 4.2 4.4  CL 109 108 112* 110  CO2 '22 23 22 23  '$ GLUCOSE 108* 80 87 91  BUN 28* '22 19 18  '$ CREATININE 0.97 0.93 0.70 0.82  CALCIUM 8.3* 7.7* 7.2* 7.6*  MG  --  1.7 2.0 1.9  PHOS  --  3.2 2.9 3.5   GFR: Estimated Creatinine Clearance: 55.2 mL/min (by C-G formula based on SCr of 0.82 mg/dL). Liver Function Tests: Recent Labs  Lab 02/18/22 0232 02/19/22 0325 02/20/22 0540 02/21/22 0500  AST '29 31 22 22  '$ ALT 34 39 32 30  ALKPHOS 39 38 44 47  BILITOT 0.6 0.5 0.4 0.4  PROT 5.0* 4.9* 4.4* 4.9*  ALBUMIN 2.5* 2.2* 2.1* 2.3*   No results for input(s): "LIPASE", "AMYLASE" in the last 168 hours. No results for input(s): "AMMONIA" in the last 168 hours. Coagulation Profile: No results for input(s): "INR", "PROTIME" in the last 168 hours. Cardiac Enzymes: No results for input(s): "CKTOTAL", "CKMB", "CKMBINDEX", "TROPONINI" in the last 168 hours. BNP (last 3 results) No results for input(s): "PROBNP" in the last 8760  hours. HbA1C: No results for input(s): "HGBA1C" in the last 72 hours. CBG: Recent Labs  Lab 02/20/22 1139 02/20/22 1643 02/20/22 2200 02/21/22 0722 02/21/22 1211  GLUCAP 210* 103* 120* 105* 235*   Lipid Profile: No  results for input(s): "CHOL", "HDL", "LDLCALC", "TRIG", "CHOLHDL", "LDLDIRECT" in the last 72 hours. Thyroid Function Tests: No results for input(s): "TSH", "T4TOTAL", "FREET4", "T3FREE", "THYROIDAB" in the last 72 hours. Anemia Panel: No results for input(s): "VITAMINB12", "FOLATE", "FERRITIN", "TIBC", "IRON", "RETICCTPCT" in the last 72 hours. Sepsis Labs: No results for input(s): "PROCALCITON", "LATICACIDVEN" in the last 168 hours.  Recent Results (from the past 240 hour(s))  Culture, blood (Routine X 2) w Reflex to ID Panel     Status: None (Preliminary result)   Collection Time: 02/18/22  9:16 AM   Specimen: Right Antecubital; Blood  Result Value Ref Range Status   Specimen Description   Final    RIGHT ANTECUBITAL BLOOD Performed at [facility], [address] [place], [place] [address]    Special Requests   Final    BOTTLES DRAWN AEROBIC ONLY Blood Culture adequate volume Performed at [place] [address], [place], [place] [address]    Culture   Final    NO GROWTH 3 DAYS Performed at [facility], [place] [address], [place], [place] [address]    Report Status PENDING  Incomplete  Culture, blood (Routine X 2) w Reflex to ID Panel     Status: None (Preliminary result)   Collection Time: 02/18/22  9:16 AM   Specimen: Left Antecubital; Blood  Result Value Ref Range Status   Specimen Description   Final    LEFT ANTECUBITAL BLOOD Performed at [facility], [place] [address] [place], [place] [address]    Special Requests   Final    BOTTLES DRAWN AEROBIC ONLY Blood Culture adequate volume Performed at [place] [address] [place], [place] [address]    Culture   Final    NO GROWTH 3  DAYS Performed at [facility], [place] [address], [place], [place] [address]    Report Status PENDING  Incomplete  Urine Culture     Status: None   Collection Time: 02/18/22 11:10 AM   Specimen: Urine, Clean Catch  Result Value Ref Range Status   Specimen Description   Final    URINE, CLEAN CATCH Performed at [facility], [place] [address], [place], [place] [address]    Special Requests   Final    Immunocompromised Performed at [facility], [place] [address], [place], [place] [address]    Culture   Final    NO GROWTH Performed at [facility], [place] [address], [place], [place] [address]    Report Status 02/19/2022 FINAL  Final  MRSA Next Gen by PCR, Nasal     Status: Abnormal   Collection Time: 02/18/22  7:30 PM   Specimen: Nasal Mucosa; Nasal Swab  Result Value Ref Range Status   MRSA by PCR Next Gen DETECTED (A) NOT DETECTED Final    Comment: RESULT CALLED TO, READ BACK BY AND VERIFIED WITH: GARLAND,J @ [phone] JMK (NOTE) The GeneXpert MRSA Assay (FDA approved for NASAL specimens only), is one component of a comprehensive MRSA colonization surveillance program. It is not intended to diagnose MRSA infection nor to guide or monitor treatment for MRSA infections. Test performance is not FDA approved in patients less than 47 years old. Performed at [facility], [place] [address] [place], [place] [address]     Radiology Studies: DG CHEST PORT 1 VIEW  Result Date: 02/20/2022 CLINICAL DATA:  [id number] with shortness of breath. EXAM: PORTABLE  CHEST 1 VIEW COMPARISON:  Portable chest yesterday at 6:19 a.m. FINDINGS: There is mild-to-moderate layering bilateral pleural effusions with overlying lung opacities which could be atelectasis or pneumonia. Right perihilar linear atelectasis is unchanged with remaining lungs clear of focal opacity. The heart is enlarged. There is mild central vascular prominence without overt  edema. Stable mediastinum with aortic calcifications and tortuosity. Right IJ port catheter tip remains at about the cavoatrial junction. Degenerative change again noted both shoulders. IMPRESSION: Overall aeration is unchanged.  No new or worsening abnormality. There are persistent pleural effusions and overlying atelectasis or consolidation in the lung bases as well as mild central vascular fullness without overt edema. Stable cardiomegaly. Electronically Signed   By: Telford Nab M.D.   On: 02/20/2022 07:44    Scheduled Meds:  amiodarone  200 mg Oral Daily   apixaban  5 mg Oral BID   Chlorhexidine Gluconate Cloth  6 each Topical Daily   Chlorhexidine Gluconate Cloth  6 each Topical Q0600   insulin aspart  0-9 Units Subcutaneous TID WC   levothyroxine  75 mcg Oral Q0600   midodrine  5 mg Oral TID WC   mupirocin ointment  1 Application Nasal BID   pantoprazole  40 mg Oral Daily   rOPINIRole  0.5 mg Oral QHS   rosuvastatin  10 mg Oral Daily   sodium chloride flush  3 mL Intravenous Q12H   Continuous Infusions:  sodium chloride 50 mL/hr at 02/21/22 0104   cefTRIAXone (ROCEPHIN)  IV 2 g (02/20/22 1758)    LOS: 4 days   Raiford Noble, DO [facility] Available via Epic secure chat 7am-7pm After these hours, please refer to coverage provider listed on [email or web address] 02/21/2022, 12:57 PM

## 2022-02-21 NOTE — [place] FL2 (Signed)
[place] LEVEL OF CARE SCREENING TOOL     IDENTIFICATION  Patient Name: Fernando Young Birthdate: 09/11/1940 Sex: male Admission Date (Current Location): 02/17/2022  [facility] and [place] Number:  [employer] and Address:  [facility],  [address] [place], [place]      Provider Number: [id number]  Attending Physician Name and Address:  Kerney Elbe, DO  Relative Name and Phone Number:  Exie Parody [phone]    Current Level of Care: Hospital Recommended Level of Care: [place] Prior Approval Number:    Date Approved/Denied:   PASRR Number:    Discharge Plan: SNF    Current Diagnoses: Patient Active Problem List   Diagnosis Date Noted   Protein-calorie malnutrition, severe 02/18/2022   Neutropenic fever ([place]) 02/17/2022   Cellulitis of chest wall 12/08/2021   HFrEF (heart failure with reduced ejection fraction) ([place]) 07/03/2021   Benign prostatic hyperplasia with post-void dribbling 06/04/2021   Ectropion of right eye 06/04/2021   Left inguinal hernia 06/04/2021   Type 2 diabetes mellitus with vascular disease ([place]) 06/04/2021   Hyperkalemia 06/04/2021   Cancer of the skin, 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   Hypoglycemia 05/06/2021   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 05/02/2021   Dehydration 08/03/2020   COVID-19 05/25/2020   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   Iron deficiency anemia 02/20/2020   Physical deconditioning 08/28/2019   SBO (small bowel obstruction) ([place]) 08/24/2019   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[place]) 08/24/2019   Paroxysmal atrial fibrillation ([place]) 08/12/2019   Acute left-sided low back pain with sciatica 05/17/2018   BCC (basal cell carcinoma), face 12/28/2016   History of nonmelanoma skin cancer 12/24/2015   Risk for falls 07/20/2015   CLL (chronic lymphocytic leukemia) ([place]) 05/25/2015   Bilateral carotid artery disease ([place]) 03/20/2015   Restless legs syndrome 04/30/2014   SVT  (supraventricular tachycardia) 01/24/2014   Ventricular ectopy 01/24/2014   CHF (congestive heart failure) ([place]) 12/25/2013   Fungal nail infection 07/17/2013   B12 deficiency 01/10/2013   Hyperlipidemia associated with type 2 diabetes mellitus ([place]) 01/10/2013   Prostate cancer ([place]) 01/04/2013   Diverticulosis of colon 12/27/2012   Essential hypertension 12/27/2012   History of colonic polyps 12/27/2012   Adenomatous polyp of colon 06/20/2011    Orientation RESPIRATION BLADDER Height & Weight     Self, Time, Situation, Place  Normal Incontinent Weight: 57.2 kg Height:  '5\' 10"'$  (177.8 cm)  BEHAVIORAL SYMPTOMS/MOOD NEUROLOGICAL BOWEL NUTRITION STATUS   (n/a)  (n/a) Continent Diet  AMBULATORY STATUS COMMUNICATION OF NEEDS Skin   Limited Assist Verbally Skin abrasions, Bruising (skin is dry & flaky abrasion redness noted to face and nose ecchymosis noted to the chest redness noted to back and buttocks)                       Personal Care Assistance Level of Assistance  Bathing, Feeding, Dressing Bathing Assistance: Limited assistance Feeding assistance: Limited assistance Dressing Assistance: Limited assistance     Functional Limitations Info  Sight, Hearing, Speech Sight Info: Impaired Hearing Info: Impaired Speech Info: Adequate    SPECIAL CARE FACTORS FREQUENCY  PT (By licensed PT), OT (By licensed OT)     PT Frequency: 5X OT Frequency: 5X            Contractures Contractures Info: Not present    Additional Factors Info  Code Status, Allergies, Psychotropic, Insulin Sliding Scale, Isolation Precautions, Suctioning Needs Code Status Info: DNR  Allergies Info: NKA Psychotropic Info: n/a Insulin Sliding Scale Info: see d/c summary sliding scale three times daily with meals Isolation Precautions Info: none Suctioning Needs: n/a   Current Medications (02/21/2022):  This is the current hospital active medication list Current Facility-Administered Medications   Medication Dose Route Frequency Provider Last Rate Last Admin   acetaminophen (TYLENOL) tablet 650 mg  650 mg Oral Q6H PRN Marcelyn Bruins, MD       Or   acetaminophen (TYLENOL) suppository 650 mg  650 mg Rectal Q6H PRN Marcelyn Bruins, MD       amiodarone (PACERONE) tablet 200 mg  200 mg Oral Daily Marcelyn Bruins, MD   200 mg at 02/21/22 7062   apixaban (ELIQUIS) tablet 5 mg  5 mg Oral BID Marcelyn Bruins, MD   5 mg at 02/21/22 0956   cefTRIAXone (ROCEPHIN) 2 g in sodium chloride 0.9 % 100 mL IVPB  2 g Intravenous Q24H Sheikh, Omair Ackerly, DO 200 mL/hr at 02/20/22 1758 2 g at 02/20/22 1758   Chlorhexidine Gluconate Cloth 2 % PADS 6 each  6 each Topical Daily Raiford Noble [place], DO   6 each at 02/21/22 0957   Chlorhexidine Gluconate Cloth 2 % PADS 6 each  6 each Topical Q0600 Raiford Noble [place], DO   6 each at 02/21/22 0502   heparin lock flush 100 unit/mL  500 Units Intracatheter Daily PRN Rosanne Sack A, PA-C       heparin lock flush 100 unit/mL  250 Units Intracatheter PRN Mosher, Vida Roller A, PA-C       heparin lock flush 100 unit/mL  250 Units Intracatheter PRN [place] Scrape A, NP       insulin aspart (novoLOG) injection 0-9 Units  0-9 Units Subcutaneous TID WC Marcelyn Bruins, MD   3 Units at 02/21/22 1242   levothyroxine (SYNTHROID) tablet 75 mcg  75 mcg Oral Q0600 Marcelyn Bruins, MD   75 mcg at 02/21/22 0614   midodrine (PROAMATINE) tablet 5 mg  5 mg Oral TID WC Sheikh, Georgina Quint [place], DO   5 mg at 02/21/22 1241   mupirocin ointment (BACTROBAN) 2 % 1 Application  1 Application Nasal BID Raiford Noble [place], DO   1 Application at 37/62/83 0957   ondansetron (ZOFRAN) tablet 4 mg  4 mg Oral Q6H PRN Marcelyn Bruins, MD       Or   ondansetron [facility]) injection 4 mg  4 mg Intravenous Q6H PRN Marcelyn Bruins, MD       pantoprazole (PROTONIX) EC tablet 40 mg  40 mg Oral Daily Marcelyn Bruins, MD   40 mg at 02/21/22 0956   polyethylene glycol (MIRALAX /  GLYCOLAX) packet 17 g  17 g Oral Daily PRN Marcelyn Bruins, MD   17 g at 02/20/22 0813   rOPINIRole (REQUIP) tablet 0.5 mg  0.5 mg Oral QHS Marcelyn Bruins, MD   0.5 mg at 02/20/22 2119   rOPINIRole (REQUIP) tablet 0.5 mg  0.5 mg Oral QHS PRN Marcelyn Bruins, MD       rosuvastatin (CRESTOR) tablet 10 mg  10 mg Oral Daily Marcelyn Bruins, MD   10 mg at 02/21/22 0956   sodium chloride flush (NS) 0.9 % injection 3 mL  3 mL Intravenous Q12H Marcelyn Bruins, MD   3 mL at 02/21/22 1000   sodium chloride flush (NS) 0.9 % injection 3 mL  3 mL Intracatheter PRN Mosher, Thalia Bloodgood, PA-C  sodium chloride flush (NS) 0.9 % injection 3 mL  3 mL Intracatheter PRN Melodye Ped, NP         Discharge Medications: Please see discharge summary for a list of discharge medications.  Relevant Imaging Results:  Relevant Lab Results:   Additional Information SS# [id number]  Angelita Ingles, RN

## 2022-02-21 NOTE — TOC Progression Note (Signed)
Transition of Care [facility]) - Progression Note    Patient Details  Name: Fernando Young MRN: [id number] Date of Birth: 13-Sep-1940  Transition of Care [facility]) CM/SW [place], RN Phone Number:[phone]  02/21/2022, 11:08 AM  Clinical Narrative:    [facility] acknowledges consult for SNF. CM will initiate SNF workup. Documentation to follow.         Expected Discharge Plan and Services                                                 Social Determinants of Health (SDOH) Interventions    Readmission Risk Interventions     No data to display

## 2022-02-21 NOTE — Progress Notes (Signed)
Inpatient Rehab Admissions Coordinator:  ? ?Per therapy recommendations,  patient was screened for CIR candidacy by Regene Mccarthy, MS, CCC-SLP. At this time, Pt. Appears to be a a potential candidate for CIR. I will place   order for rehab consult per protocol for full assessment. Please contact me any with questions. ? ?Mechel Schutter, MS, CCC-SLP ?Rehab Admissions Coordinator  ?[phone] (celll) ?[phone] (office) ? ?

## 2022-02-21 NOTE — Care Management Important Message (Signed)
Important Message  Patient Details IM Letter placed in Patients room. Name: Fernando Young MRN: [id number] Date of Birth: 04-Feb-1941   Medicare Important Message Given:  Yes     Kerin Salen 02/21/2022, 12:35 PM

## 2022-02-22 DIAGNOSIS — B9620 Unspecified Escherichia coli [E. coli] as the cause of diseases classified elsewhere: Secondary | ICD-10-CM

## 2022-02-22 DIAGNOSIS — D709 Neutropenia, unspecified: Secondary | ICD-10-CM | POA: Diagnosis not present

## 2022-02-22 DIAGNOSIS — E039 Hypothyroidism, unspecified: Secondary | ICD-10-CM | POA: Diagnosis not present

## 2022-02-22 DIAGNOSIS — C9110 Chronic lymphocytic leukemia of B-cell type not having achieved remission: Secondary | ICD-10-CM | POA: Diagnosis not present

## 2022-02-22 DIAGNOSIS — R7881 Bacteremia: Secondary | ICD-10-CM | POA: Diagnosis not present

## 2022-02-22 LAB — CBC WITH DIFFERENTIAL/PLATELET
Abs Immature Granulocytes: 0.31 10*3/uL — ABNORMAL HIGH (ref 0.00–0.07)
Basophils Absolute: 0.1 10*3/uL (ref 0.0–0.1)
Basophils Relative: 1 %
Eosinophils Absolute: 0.1 10*3/uL (ref 0.0–0.5)
Eosinophils Relative: 0 %
HCT: 30.3 % — ABNORMAL LOW (ref 39.0–52.0)
Hemoglobin: 9.3 g/dL — ABNORMAL LOW (ref 13.0–17.0)
Immature Granulocytes: 1 %
Lymphocytes Relative: 87 %
Lymphs Abs: 25.1 10*3/uL — ABNORMAL HIGH (ref 0.7–4.0)
MCH: 29.4 pg (ref 26.0–34.0)
MCHC: 30.7 g/dL (ref 30.0–36.0)
MCV: 95.9 fL (ref 80.0–100.0)
Monocytes Absolute: 0.5 10*3/uL (ref 0.1–1.0)
Monocytes Relative: 2 %
Neutro Abs: 2.7 10*3/uL (ref 1.7–7.7)
Neutrophils Relative %: 9 %
Platelets: 230 10*3/uL (ref 150–400)
RBC: 3.16 MIL/uL — ABNORMAL LOW (ref 4.22–5.81)
RDW: 14.4 % (ref 11.5–15.5)
WBC Morphology: ABNORMAL
WBC: 28.7 10*3/uL — ABNORMAL HIGH (ref 4.0–10.5)
nRBC: 0 % (ref 0.0–0.2)

## 2022-02-22 LAB — COMPREHENSIVE METABOLIC PANEL
ALT: 27 U/L (ref 0–44)
AST: 20 U/L (ref 15–41)
Albumin: 2.2 g/dL — ABNORMAL LOW (ref 3.5–5.0)
Alkaline Phosphatase: 46 U/L (ref 38–126)
Anion gap: 4 — ABNORMAL LOW (ref 5–15)
BUN: 17 mg/dL (ref 8–23)
CO2: 24 mmol/L (ref 22–32)
Calcium: 7.6 mg/dL — ABNORMAL LOW (ref 8.9–10.3)
Chloride: 107 mmol/L (ref 98–111)
Creatinine, Ser: 0.74 mg/dL (ref 0.61–1.24)
GFR, Estimated: >60 mL/min (ref >60)
Glucose, Bld: 89 mg/dL (ref 70–99)
Potassium: 4.6 mmol/L (ref 3.5–5.1)
Sodium: 135 mmol/L (ref 135–145)
Total Bilirubin: 0.5 mg/dL (ref 0.3–1.2)
Total Protein: 4.8 g/dL — ABNORMAL LOW (ref 6.5–8.1)

## 2022-02-22 LAB — GLUCOSE, CAPILLARY
Glucose-Capillary: 104 mg/dL — ABNORMAL HIGH (ref 70–99)
Glucose-Capillary: 121 mg/dL — ABNORMAL HIGH (ref 70–99)
Glucose-Capillary: 124 mg/dL — ABNORMAL HIGH (ref 70–99)
Glucose-Capillary: 131 mg/dL — ABNORMAL HIGH (ref 70–99)

## 2022-02-22 LAB — PHOSPHORUS: Phosphorus: 3.3 mg/dL (ref 2.5–4.6)

## 2022-02-22 LAB — MAGNESIUM: Magnesium: 1.9 mg/dL (ref 1.7–2.4)

## 2022-02-22 MED ORDER — FUROSEMIDE 40 MG PO TABS: 20.0000 mg | ORAL_TABLET | Freq: Every day | ORAL | 0 refills | Status: DC

## 2022-02-22 MED ORDER — MIDODRINE HCL 5 MG PO TABS: 5.0000 mg | ORAL_TABLET | Freq: Three times a day (TID) | ORAL | Status: DC

## 2022-02-22 MED ORDER — POLYETHYLENE GLYCOL 3350 17 G PO PACK: 17.0000 g | PACK | Freq: Every day | ORAL | 0 refills | Status: DC | PRN

## 2022-02-22 MED ORDER — ONDANSETRON HCL 4 MG PO TABS: 4.0000 mg | ORAL_TABLET | Freq: Four times a day (QID) | ORAL | 0 refills | Status: DC | PRN

## 2022-02-22 NOTE — Progress Notes (Signed)
    Inpatient Rehabilitation Admissions Coordinator   I spoke with pt's daughter, [name] by phone for rehab assessment. We discussed goals and expectations of a possible CIR admit. They prefer CIR for rehab. Family can provide expected caregiver support that is recommended. I will begin insurance Auth with [facility] for possible CIR admit pending approval. Please call me with any questions.   Danne Baxter, RN, MSN Rehab Admissions Coordinator [phone]

## 2022-02-22 NOTE — TOC Initial Note (Addendum)
Transition of Care [facility]) - Initial/Assessment Note    Patient Details  Name: Fernando Young MRN: [id number] Date of Birth: 1940-06-15  Transition [facility]) CM/SW Contact:    Fernando Ingles, RN Phone Number:[phone]  02/22/2022, 8:16 AM  Clinical Narrative:                 TOC consulted for SNF. Patient is from home where he reports that he is from home where his son and daughter in law live with him. Patient states that he normally functions pretty independently with some assist for transportation  and meals. Daughter Fernando Young at bedside states that she comes to assist patient on a daily basis after work. Patient reports that he does have a PCP Dr. Heber Cuba at [facility]. [facility] [place]. Patient states that meds are affordable . Patient states that he does have transportation to appointments. Patient reports that he currently has a rollator, cane and wheelchair at home. Patient has a Therapist, sports that comes from [place] as part of his insurance benefits.   CM made patient and daughter aware that patient has a SNF recommendation. Daughter states that both are agreeable to SNF but would like to be placed close to [facility]. CM has provided patient and daughter with medicare.gov list. Patient and daughter give CM permission to do a SNF bed search.   FL2 has been completed and faxed out for possible bed offers.   PASSR # [id number]        Patient Goals and CMS Choice        Expected Discharge Plan and Services                                                Prior Living Arrangements/Services                       Activities of Daily Living Home Assistive Devices/Equipment: Bedside commode/3-in-1, Grab bars around toilet, Eyeglasses ADL Screening (condition at time of admission) Patient's cognitive ability adequate to safely complete daily activities?: Yes Is the patient deaf or have difficulty hearing?: No Does the  patient have difficulty seeing, even when wearing glasses/contacts?: No Does the patient have difficulty concentrating, remembering, or making decisions?: Yes Patient able to express need for assistance with ADLs?: Yes Does the patient have difficulty dressing or bathing?: Yes Independently performs ADLs?: No Communication: Independent Dressing (OT): Needs assistance Is this a change from baseline?: Pre-admission baseline Grooming: Dependent Is this a change from baseline?: Pre-admission baseline Feeding: Needs assistance Is this a change from baseline?: Pre-admission baseline Bathing: Dependent Is this a change from baseline?: Pre-admission baseline Toileting: Needs assistance Is this a change from baseline?: Pre-admission baseline In/Out Bed: Needs assistance Is this a change from baseline?: Pre-admission baseline Walks in Home: Needs assistance Is this a change from baseline?: Pre-admission baseline Does the patient have difficulty walking or climbing stairs?: Yes Weakness of Legs: Both Weakness of Arms/Hands: Both  Permission Sought/Granted                  Emotional Assessment              Admission diagnosis:  Neutropenic fever ([place]) [D70.9, R50.81] Patient Active Problem List   Diagnosis Date Noted   Protein-calorie malnutrition, severe 02/18/2022  Neutropenic fever ([place]) 02/17/2022   Cellulitis of chest wall 12/08/2021   HFrEF (heart failure with reduced ejection fraction) ([place]) 07/03/2021   Benign prostatic hyperplasia with post-void dribbling 06/04/2021   Ectropion of right eye 06/04/2021   Left inguinal hernia 06/04/2021   Type 2 diabetes mellitus with vascular disease ([place]) 06/04/2021   Hyperkalemia 06/04/2021    Class: Health Concern   Cancer of the skin, 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   Hypoglycemia 05/06/2021   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 05/02/2021    Class: Chronic   Dehydration 08/03/2020   COVID-19 05/25/2020   Acquired hypothyroidism  03/16/2020   Iron deficiency anemia 02/20/2020    Class: Chronic   Physical deconditioning 08/28/2019   SBO (small bowel obstruction) ([place]) 08/24/2019   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[place]) 08/24/2019   Paroxysmal atrial fibrillation ([place]) 08/12/2019   Acute left-sided low back pain with sciatica 05/17/2018   BCC (basal cell carcinoma), face 12/28/2016   History of nonmelanoma skin cancer 12/24/2015   Risk for falls 07/20/2015   CLL (chronic lymphocytic leukemia) ([place]) 05/25/2015   Bilateral carotid artery disease ([place]) 03/20/2015   Restless legs syndrome 04/30/2014   SVT (supraventricular tachycardia) 01/24/2014   Ventricular ectopy 01/24/2014   CHF (congestive heart failure) ([place]) 12/25/2013   Fungal nail infection 07/17/2013   B12 deficiency 01/10/2013   Hyperlipidemia associated with type 2 diabetes mellitus ([place]) 01/10/2013   Prostate cancer ([place]) 01/04/2013   Diverticulosis of colon 12/27/2012   Essential hypertension 12/27/2012   History of colonic polyps 12/27/2012   Adenomatous polyp of colon 06/20/2011   PCP:  Raelene Bott, MD Pharmacy[facility]   [id number] [place] [place] [address]Phone: [phone]: [id number]: [phone]: [phone]    Social Determinants of Health (SDOH) Interventions    Readmission Risk Interventions     No data to display

## 2022-02-22 NOTE — Progress Notes (Signed)
Physical Therapy Treatment Patient Details Name: Fernando Young MRN: [id number] DOB: May 02, 1941 Today's Date: 02/22/2022   History of Present Illness Pt admitted from home with diarrhea and neutropenic fever in setting of enteropathic ecoli bacteremia.  Pt with hx of CHF, DM, HFrEF, PAF, GIB, carotid artery disease, and CLL (chemo currently on hold)    PT Comments    Pt received supine in bed reporting no pain and expressing general frustration with therapeutic frequency: "I've been in this bed for days!" Pt is progressing well compared to previous visit and required min assist for bed mobility and transfers and was able to ambulate in hallway with RW and chair follow for safety. Discharge destination updated to CIR-level therapies secondary to pt's improved status and motivation levels though cautioned pt to seriously consider if he feels that he can tolerate the 3-hour/day activity level, pt assured this therapist that he can. Pt and daughter are amenable to attempting CIR with SNF-level therapies as possibility. This therapist has updated acute care frequencies accordingly and added pt to the mobility specialist caseload as well. We will continue to follow acutely.    Recommendations for follow up therapy are one component of a multi-disciplinary discharge planning process, led by the attending physician.  Recommendations may be updated based on patient status, additional functional criteria and insurance authorization.  Follow Up Recommendations  Acute inpatient rehab (3hours/day) (vs. SNF if activity tolerance decreases) Can patient physically be transported by private vehicle: Yes   Assistance Recommended at Discharge Frequent or constant Supervision/Assistance  Patient can return home with the following A little help with bathing/dressing/bathroom;Assistance with cooking/housework;Assist for transportation;Help with stairs or ramp for entrance;A little help with walking and/or transfers    Equipment Recommendations  None recommended by PT (TBD at next venue of care)    Recommendations for Other Services       Precautions / Restrictions Precautions Precautions: Fall Restrictions Weight Bearing Restrictions: No     Mobility  Bed Mobility Overal bed mobility: Needs Assistance Bed Mobility: Supine to Sit     Supine to sit: Min assist     General bed mobility comments: Min assist to scoot hips EOB using bed pad, otherwise min guard for safety, pt with one short rest break during mobility.    Transfers Overall transfer level: Needs assistance Equipment used: Rolling walker (2 wheels) Transfers: Sit to/from Stand Sit to Stand: Min assist, From elevated surface           General transfer comment: Min assist for light lift assist from elevated surface, VCs for use of hands on bed to push up.    Ambulation/Gait Ambulation/Gait assistance: +2 safety/equipment, Min guard Gait Distance (Feet): 120 Feet Assistive device: Rolling walker (2 wheels) Gait Pattern/deviations: Step-to pattern, Decreased step length - right, Decreased step length - left, Shuffle, Trunk flexed, Wide base of support, Decreased weight shift to left Gait velocity: decr     General Gait Details: Pt ambulated with RW and min guard, +2 for recliner follow, no physical assist required or overt LOB noted. Cues for posture and position from RW. 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 Had pt perform wayfinding task and pt was not able to find his room again without cuing.   Stairs             Wheelchair Mobility    Modified Rankin (Stroke Patients Only)       Balance Overall balance assessment: Needs  assistance Sitting-balance support: No upper extremity supported, Feet supported Sitting balance-Leahy Scale: Fair     Standing balance support: Bilateral upper extremity supported Standing balance-Leahy Scale: Poor                               Cognition Arousal/Alertness: Awake/alert Behavior During Therapy: WFL for tasks assessed/performed Overall Cognitive Status: Within Functional Limits for tasks assessed                                 General Comments: Mildly slurred/garbled speech difficult to understand at times.        Exercises General Exercises - Lower Extremity Ankle Circles/Pumps: AROM, Both, 5 reps Quad Sets: AROM, Both, 5 reps Gluteal Sets: AROM, Both, 5 reps Short Arc Quad: AROM, Both, 5 reps Heel Slides: AROM, Both, 5 reps Hip ABduction/ADduction: AROM, Both, 5 reps Straight Leg Raises: AROM, Both, 5 reps    General Comments General comments (skin integrity, edema, etc.): Daughter [name] present      Pertinent Vitals/Pain Pain Assessment Pain Assessment: No/denies pain    Home Living                          Prior Function            PT Goals (current goals can now be found in the care plan section) Acute Rehab PT Goals Patient Stated Goal: REgain IND PT Goal Formulation: With patient Time For Goal Achievement: 03/06/22 Potential to Achieve Goals: Good Progress towards PT goals: Progressing toward goals    Frequency    Min 3X/week      PT Plan Discharge plan needs to be updated    Co-evaluation              AM-PAC PT "6 Clicks" Mobility   Outcome Measure  Help needed turning from your back to your side while in a flat bed without using bedrails?: A Little Help needed moving from lying on your back to sitting on the side of a flat bed without using bedrails?: A Little Help needed moving to and from a bed to a chair (including a wheelchair)?: A Little Help needed standing up from a chair using your arms (e.g., wheelchair or bedside chair)?: A Little Help needed to walk in hospital room?: A Lot Help needed climbing 3-5 steps with a railing? : Total 6 Click Score: 15    End of Session Equipment Utilized  During Treatment: Gait belt Activity Tolerance: Patient tolerated treatment well;No increased pain Patient left: in chair;with call bell/phone within reach;with chair alarm set;with family/visitor present Nurse Communication: Mobility status PT Visit Diagnosis: Unsteadiness on feet (R26.81);Muscle weakness (generalized) (M62.81);Difficulty in walking, not elsewhere classified (R26.2)     Time: 0017-4944 PT Time Calculation (min) (ACUTE ONLY): 27 min  Charges:  $Gait Training: 8-22 mins $Therapeutic Exercise: 8-22 mins                     Coolidge Breeze, PT, DPT [facility] Rehabilitation Department Office: [phone] Weekend pager: [phone]   Coolidge Breeze 02/22/2022, 11:26 AM

## 2022-02-22 NOTE — Discharge Summary (Signed)
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 [facility] Course: The patient is a chronically ill appearing Caucasian male with a past medical history significant for but secondary to CLL, history of squamous cell carcinoma, history of chronic combined systolic and diastolic CHF, paroxysmal atrial fibrillation, BPH, hypertension, history of GI bleed, diabetes mellitus type 2, hypothyroidism, carotid artery disease, history of polyps, as well as prostate cancer and other comorbidities who presented with neutropenic fever and not feeling well.  He is normally seen in [place] and is getting chemotherapy which is currently being held due to ongoing issues with symptoms.  He was recently treated for enteropathic E. coli in the last month with improvement after admission and began to feel worse recently and noted some black stools which progressed last several days to diarrhea with coffee-ground appearance.  He also had decreased p.o. intake due to fear of diarrhea and subsequent worsening weakness.   Also reports some congestion and had fevers recorded as high as 100.8 at home.  Given him not feeling well he presented to the [facility] ED and there he had Initial blood pressure in the 83M systolic and he was given 500 cc bolus today with improvement.  Lab work there showed a chronic leukocytosis and his flu and COVID negative.  Chest x-ray showed increasing left lower lobe atelectasis versus airspace disease and blood cultures were obtained and grew out 1 out of 4 E. coli.  In the ED received cefepime, Flagyl and 500 cc of IV fluid and was transferred to [facility] and oncology has been consulted and following.  Cultures are being repeated here and he has been placed on IV antibiotics and de-escalated to IV ceftriaxone.  Given his persistent hypotension PCCM has been consulted for further evaluation given his significant CHF history with concern for volume overload with too much fluid resuscitation.   Patient blood pressure improved after fluid boluses and ANC improved to.  Cultures are still pending in our system.  He has been de-escalated to IV ceftriaxone and PT OT is to further evaluate and treat and they are now recommending CIR and he seems to be a candidate so this is improving.  Patient is improving so has been transferred out of the stepdown unit to the telemetry floor and the patient is medically stable for discharge at this time and awaiting insurance authorization for CIR.  Assessment and Plan: No notes have been filed under this hospital service. Service: Hospitalist  Recent Diarrhea likely in the setting of enteropathic E. Coli, improved Neutropenic fever in the setting  of E. coli bacteremia -Patient presenting with generally feeling unwell concern for black stools and diarrhea.  Also with congestion and fever as high as 100.8 at home.  Noted to have neutropenia with ANC of 0.1 on admission > In the setting of CLL as below. > Started vancomycin and cefepime on advice of oncology in ED at  outside hospital but this has been changed to IV ceftriaxone given his E. coli bacteremia.  Sensitivities from [facility] laboratory have been obtained and it was sensitive to cefepime, ceftazidime, ceftriaxone, ertapenem and gentamicin but resistant to everything else; will continue IV ceftriaxone for now and then change to p.o. cefadroxil 1 gram po BID to complete 10 days > Initially some hypotension but this appears to have improved with 500 cc bolus remains persistently hypotensive. > Negative for flu COVID and RSV at outside hospital.  They have ordered C. difficile and GI pathogen panel which are pending due to his diarrhea.  Urine cultures and blood cultures are pending.  Chest x-ray also stated there was some increased atelectasis versus airspace disease of the left lower lobe. -Repeat cultures and blood cultures x2, urinalysis and urine culture ordered; blood cultures x2 showing no growth to date so far at 4 days and urine culture is no growth -Chest x-ray done and showed "Linear opacity in the right lower lung most likely represents atelectasis, but in the setting of a neutropenic fever, infection is not entirely excluded. Consider further evaluation with CT chest for more definitive characterization." - Oncology consulted and appreciate further evaluation recommendations -Urinalysis did not show any signs of infection -WBC has gone from 15.2 -> 19.3 -> 20.5 -> 21.3 -> 27.2 -> 30.5 -> 28.1 -> 28.7 -IVF with NS at 50 mL/hr now stopped -Stable to transfer from the stepdown unit to the medical floor - Continue vancomycin and cefepime and this is just de-escalated to IV ceftriaxone -Trend fever curve and WBC/ANC; ANC is now 3.2 -Follow-up outside hospital urine culture and blood cultures -Follow-up outside hospital GI pathogen panel and C. difficile studies and this was negative -Given persistently low hypotension pulmonary critical care has been consulted for further evaluation  recommendations but he is now improved and they have signed off the case -PT OT recommending now recommending CIR and patient and daughter agreeable and remains medically stable to be discharged and can be transition to oral Cefadroxil 1000 mg p.o. twice daily for completion of the course for 10 days   Hypertension and now remains hypotensive still which is persistent, now improved > Initially hypotensive in the ED with blood pressures reportedly as low as the 57D systolic however this improved significantly with 500 cc bolus.  Outpatient blood pressures appear to be in the 80s to 100s in the past couple weeks per chart review. - Holding home Lasix and benazepril for now -Received total of 1750 mL boluses and his blood pressures improved -Added midodrine 5 mg p.o. 3 times daily and PCCM was consulted for his hypotension but his hypotension is now improved and resolved so they have signed off the case    CLL Squama Cell Carcinoma > History of squamous cell carcinoma had been on Erivedge but developed additional lesions and was started on,Comiplimab, but this was held due to worsening fatigue. > History of CLL, treatment initially held due to comorbidities and that he was started on acalabrutinib, however he developed GI bleeding this has been held. > Does follow with oncology in [place] [place] [facility]. - Oncology is aware  of patient and they have started the patient on Granix given his neutropenic fever and recommending broad-spectrum antibiotics and he has not been just de-escalated to IV ceftriaxone given his sensitivity - Continue to monitor - Supportive care and continuing antibiotics as above and oncology has started the patient on Granix 300 mcg daily and recommending discontinuing once his [place] crosses 1000 and now it is 2700   Chronic combined systolic and diastolic CHF > Known history of CHF last echo was earlier this month with EF of 35%, G1 DD, normal RV function. - Holding home Lasix in  the setting of hypotension during initial evaluation in the ED -Stopped IVF now -Avoid excessive IV fluids and patient is + 655.8 mL -Continue to monitor and trend renal function and electrolytes and continue strict I's and O's and daily weights -Continue monitor for signs and symptoms of volume overload    Paroxysmal atrial fibrillation -Continue home Amiodarone 200 mg po Daily and Apixaban 5 mg po BID -Continue monitor on telemetry in the stepdown unit   History of GI bleed Dark stools, improving  Anemia likely chronic kidney disease versus cancer associated with iron deficiency anemia > No history of GI bleed this was a complication of chemotherapy in the past.  Family had some concerns about recurrent dark stools however hemoglobin remained stable. -His Eliquis has been resumed and his hemoglobin/hematocrit has remained relatively stable last few checks and has gone from 9.2/30.2 -> 8.9/29.2 -> 8.7/28.0 -> 8.4/27.5 -> 9.0/29.3 -> 9.2/30.4 -> 9.3/30.3 -Continue to monitor and transfuse for hemoglobin less than 7 currently has no indication for PRBC transfusion -We will continue with PPI -Continue to monitor for signs and symptoms of bleeding -Repeat CBC in the a.m.   Diabetes Mellitus Type 2 -Continue sensitive NovoLog sliding scale AC -CBGs ranging from 92-121 -Continue monitor blood sugars carefully and if necessary will make further adjustments   Hypothyroidism -Continue home Levothyroxine 75 mcg po Daily  -Recent TSH was 1.543 and T4 was 8.3   RLS - Continue home ropinirole with 0.5 mg p.o. nightly and with an additional 0.5 mg p.o. nightly as needed if the bedtime dose does not work   Carotid artery disease -Continue home Rosuvastatin 10 mg p.o. total -On Apixaban 5 mg po BID as above   History of BPH and prostate cancer - Noted we will need to continue to monitor strict I's and O's -Patient is +655.8 mL since admission   Severe Protein Calorie Malnutrition -Nutrition  Status: Nutrition Problem: Severe Malnutrition Etiology: chronic illness Signs/Symptoms: severe fat depletion, severe muscle depletion Interventions: Liberalize Diet, Ensure Enlive (each supplement provides 350kcal and 20 grams of protein)  Nutrition Documentation    Flowsheet Row Admission (Current) from 02/17/2022 in [facility]  Nutrition Problem Severe Malnutrition  Etiology chronic illness  Nutrition Goal Patient will meet greater than or equal to 90% of their needs  Interventions Liberalize Diet, Ensure Enlive (each supplement provides 350kcal and 20 grams of protein)      Consultants: Medical Oncology; PCCM Procedures performed: As above  Disposition:  CIR Diet recommendation:  Regular diet DISCHARGE MEDICATION: Allergies as of 02/22/2022   No Known Allergies      Medication List     STOP taking these medications    benazepril 20 MG tablet Commonly known as: LOTENSIN   Calquence 100 MG tablet Generic drug: acalabrutinib maleate       TAKE these medications    acetaminophen 500 MG tablet Commonly known  as: TYLENOL Take 1,000 mg by mouth every 6 (six) hours as needed for mild pain, moderate pain or fever.   amiodarone 200 MG tablet Commonly known as: PACERONE Take 200 mg by mouth daily.   bismuth subsalicylate 875 IE/33IR suspension Commonly known as: PEPTO BISMOL Take 30 mLs by mouth every 6 (six) hours as needed. For constipation   cholecalciferol 25 MCG (1000 UNIT) tablet Commonly known as: VITAMIN D3 Take 1,000 Units by mouth daily.   Eliquis 5 MG Tabs tablet Generic drug: apixaban Take 1 tablet by mouth 2 (two) times daily.   Fish Oil 1000 MG Caps Take 1,000 mg by mouth daily.   furosemide 40 MG tablet Commonly known as: LASIX Take 0.5 tablets (20 mg total) by mouth daily. What changed: how much to take   ketoconazole 2 % shampoo Commonly known as: NIZORAL Apply topically 3 (three) times a week.   levothyroxine 75  MCG tablet Commonly known as: SYNTHROID Take 75 mcg by mouth daily before breakfast.   metFORMIN 500 MG 24 hr tablet Commonly known as: GLUCOPHAGE-XR Take 500 mg by mouth 2 (two) times daily.   midodrine 5 MG tablet Commonly known as: PROAMATINE Take 1 tablet (5 mg total) by mouth 3 (three) times daily with meals.   ondansetron 4 MG tablet Commonly known as: ZOFRAN Take 1 tablet (4 mg total) by mouth every 6 (six) hours as needed for nausea.   pantoprazole 40 MG tablet Commonly known as: Protonix Take 1 tablet (40 mg total) by mouth daily.   polyethylene glycol 17 g packet Commonly known as: MIRALAX / GLYCOLAX Take 17 g by mouth daily as needed for mild constipation.   rOPINIRole 0.5 MG tablet Commonly known as: REQUIP Take 0.5-1 mg by mouth See admin instructions. Taking one tablet at bedtime, if no relief take an additional tablet   rosuvastatin 20 MG tablet Commonly known as: CRESTOR Take 10 mg by mouth daily.   vitamin B-12 500 MCG tablet Commonly known as: CYANOCOBALAMIN Take 500 mcg by mouth daily.   Vitamin C 500 MG Caps Take 500 mg by mouth daily.        Discharge Exam: Filed Weights   02/18/22 0605 02/19/22 0500 02/22/22 0517  Weight: 65.4 kg 57.2 kg 70.1 kg   Vitals:   02/22/22 0517 02/22/22 1407  BP: 123/71 107/62  Pulse: 60 64  Resp: 14 14  Temp: 98.6 F (37 C) 97.8 F (36.6 C)  SpO2: 97% 99%   Examination: Physical Exam:  Constitutional: Thin elderly chronically ill-appearing Caucasian male in NAD appears calm Respiratory: Diminished to auscultation bilaterally, no wheezing, rales, rhonchi or crackles. Normal respiratory effort and patient is not tachypenic. No accessory muscle use. Unlabored breathing  Cardiovascular: RRR, no murmurs / rubs / gallops. S1 and S2 auscultated. 1-2+ LE extremity edema.  Abdomen: Soft, non-tender, non-distended. Bowel sounds positive.  GU: Deferred. Musculoskeletal: No clubbing / cyanosis of digits/nails. No  joint deformity upper and lower extremities. Skin: Has multiple skin lesions noted throughout his body. No induration; Warm and dry.  Neurologic: CN 2-12 grossly intact with no focal deficits. Romberg sign and cerebellar reflexes not assessed.  Psychiatric: Normal judgment and insight. Alert and oriented x 3. Normal mood and appropriate affect.   Condition at discharge: stable  The results of significant diagnostics from this hospitalization (including imaging, microbiology, ancillary and laboratory) are listed below for reference.   Imaging Studies: DG CHEST PORT 1 VIEW  Result Date: 02/20/2022 CLINICAL DATA:  [id number]  with shortness of breath. EXAM: PORTABLE CHEST 1 VIEW COMPARISON:  Portable chest yesterday at 6:19 a.m. FINDINGS: There is mild-to-moderate layering bilateral pleural effusions with overlying lung opacities which could be atelectasis or pneumonia. Right perihilar linear atelectasis is unchanged with remaining lungs clear of focal opacity. The heart is enlarged. There is mild central vascular prominence without overt edema. Stable mediastinum with aortic calcifications and tortuosity. Right IJ port catheter tip remains at about the cavoatrial junction. Degenerative change again noted both shoulders. IMPRESSION: Overall aeration is unchanged.  No new or worsening abnormality. There are persistent pleural effusions and overlying atelectasis or consolidation in the lung bases as well as mild central vascular fullness without overt edema. Stable cardiomegaly. Electronically Signed   By: Telford Nab M.D.   On: 02/20/2022 07:44   DG CHEST PORT 1 VIEW  Result Date: 02/19/2022 CLINICAL DATA:  SOB EXAM: PORTABLE CHEST - 1 VIEW COMPARISON:  02/18/2022 FINDINGS: Stable right IJ port catheter to the distal SVC. Linear atelectasis or scarring in the right mid lung slightly more conspicuous. Ill-defined interstitial infiltrates laterally in the right mid lung and in the lung bases left greater  than right are basically stable. Heart size and mediastinal contours are within normal limits. No pneumothorax. Blunting of right lateral costophrenic angle possibly positional. Visualized bones unremarkable. IMPRESSION: Persistent bilateral interstitial opacities, left greater than right. Electronically Signed   By: Lucrezia Europe M.D.   On: 02/19/2022 08:00   DG CHEST PORT 1 VIEW  Result Date: 02/18/2022 CLINICAL DATA:  Neutropenic fever EXAM: PORTABLE CHEST 1 VIEW COMPARISON:  05/25/20 CXR FINDINGS: Right-sided chest port in place with tip the mid SVC. No pleural effusion. No pneumothorax. Cardiac and mediastinal contours are unchanged compared to prior exam. There is a linear opacity in the right lower lung which is new from prior exam. There is also poor visualization of the medial aspect of the left hemidiaphragm, which could be secondary to an additional superimposed airspace opacity. Increased prominence of the descending right pulmonary artery, possibly positional. No displaced rib fractures. Visualized upper abdomen is unremarkable. IMPRESSION: Linear opacity in the right lower lung most likely represents atelectasis, but in the setting of a neutropenic fever, infection is not entirely excluded. Consider further evaluation with CT chest for more definitive characterization. Electronically Signed   By: Marin Roberts M.D.   On: 02/18/2022 12:19    Microbiology: Results for orders placed or performed during the hospital encounter of 02/17/22  Culture, blood (Routine X 2) w Reflex to ID Panel     Status: None (Preliminary result)   Collection Time: 02/18/22  9:16 AM   Specimen: Right Antecubital; Blood  Result Value Ref Range Status   Specimen Description   Final    RIGHT ANTECUBITAL BLOOD Performed at [facility], [place] [address] [place], [place] [address]    Special Requests   Final    BOTTLES DRAWN AEROBIC ONLY Blood Culture adequate volume Performed at [place] [address] [place], [place] [address]    Culture   Final    NO GROWTH 4 DAYS Performed at [facility], [place] [address] [place], [place] [address]    Report Status PENDING  Incomplete  Culture, blood (Routine X 2) w Reflex to ID Panel     Status: None (Preliminary result)   Collection Time: 02/18/22  9:16 AM   Specimen: Left Antecubital; Blood  Result Value Ref Range Status   Specimen Description  Final    LEFT ANTECUBITAL BLOOD Performed at [facility], [place] [address] [place], [place] [address]    Special Requests   Final    BOTTLES DRAWN AEROBIC ONLY Blood Culture adequate volume Performed at [place] [address], [place], [place] [address]    Culture   Final    NO GROWTH 4 DAYS Performed at [facility], [place] [address] [place], [place] [address]    Report Status PENDING  Incomplete  Urine Culture     Status: None   Collection Time: 02/18/22 11:10 AM   Specimen: Urine, Clean Catch  Result Value Ref Range Status   Specimen Description   Final    URINE, CLEAN CATCH Performed at [facility], [place] [address], [place], [place] [address]    Special Requests   Final    Immunocompromised Performed at [facility], [place] [address] [place], [place] [address]    Culture   Final    NO GROWTH Performed at [facility], [place] [address] [place], [place] [address]    Report Status 02/19/2022 FINAL  Final  MRSA Next Gen by PCR, Nasal     Status: Abnormal   Collection Time: 02/18/22  7:30 PM   Specimen: Nasal Mucosa; Nasal Swab  Result Value Ref Range Status   MRSA by PCR Next Gen DETECTED (A) NOT DETECTED Final    Comment: RESULT CALLED TO, READ BACK BY AND VERIFIED WITH: GARLAND,J @ [id number] JMK (NOTE) The GeneXpert MRSA Assay (FDA approved for NASAL specimens only), is one component of a comprehensive MRSA colonization surveillance program. It is not intended to diagnose MRSA  infection nor to guide or monitor treatment for MRSA infections. Test performance is not FDA approved in patients less than 80 years old. Performed at [facility], [place] [address], [place],  [address]     Labs: CBC: Recent Labs  Lab 02/18/22 [phone] 02/19/22 0325 02/20/22 0540 02/21/22 0500 02/22/22 0500  WBC 21.3* 27.2* 30.5* 28.1* 28.7*  NEUTROABS 0.3* 2.3 3.8 3.2 2.7  HGB 8.7* 8.4* 9.0* 9.2* 9.3*  HCT 28.0* 27.5* 29.3* 30.4* 30.3*  MCV 96.2 96.8 96.1 97.1 95.9  PLT 196 183 218 235 694   Basic Metabolic Panel: Recent Labs  Lab 02/18/22 0232 02/19/22 0325 02/20/22 0540 02/21/22 0500 02/22/22 0500  NA 138 136 139 138 135  K 4.5 3.9 4.2 4.4 4.6  CL 109 108 112* 110 107  CO2 '22 23 22 23 24  '$ GLUCOSE 108* 80 87 91 89  BUN 28* '22 19 18 17  '$ CREATININE 0.97 0.93 0.70 0.82 0.74  CALCIUM 8.3* 7.7* 7.2* 7.6* 7.6*  MG  --  1.7 2.0 1.9 1.9  PHOS  --  3.2 2.9 3.5 3.3   Liver Function Tests: Recent Labs  Lab 02/18/22 0232 02/19/22 0325 02/20/22 0540 02/21/22 0500 02/22/22 0500  AST '29 31 22 22 20  '$ ALT 34 39 32 30 27  ALKPHOS 39 38 44 47 46  BILITOT 0.6 0.5 0.4 0.4 0.5  PROT 5.0* 4.9* 4.4* 4.9* 4.8*  ALBUMIN 2.5* 2.2* 2.1* 2.3* 2.2*   CBG: Recent Labs  Lab 02/21/22 1653 02/21/22 2024 02/22/22 0748 02/22/22 1151 02/22/22 1630  GLUCAP 92 110* 104* 121* 124*   Discharge time spent: greater than 30 minutes.  Signed: Raiford Noble, DO [facility] 02/22/2022

## 2022-02-22 NOTE — Progress Notes (Signed)
Mobility Specialist - Progress Note   02/22/22 1328  Mobility  Activity Ambulated with assistance in hallway  Level of Assistance Minimal assist, patient does 75% or more  Assistive Device Front wheel walker  Distance Ambulated (ft) 170 ft  Activity Response Tolerated well  Mobility Referral Yes  $Mobility charge 1 Mobility   Pt received in chair and agreed to mobility, no c/o pain nor discomfort. Pt left foot somewhat drags but did not inhibit movement, pt back to chair with all needs met with the chair alarm on and family in room.  Roderick Pee Mobility Specialist

## 2022-02-23 DIAGNOSIS — D709 Neutropenia, unspecified: Secondary | ICD-10-CM | POA: Diagnosis not present

## 2022-02-23 DIAGNOSIS — R7881 Bacteremia: Secondary | ICD-10-CM | POA: Diagnosis not present

## 2022-02-23 DIAGNOSIS — C9110 Chronic lymphocytic leukemia of B-cell type not having achieved remission: Secondary | ICD-10-CM | POA: Diagnosis not present

## 2022-02-23 DIAGNOSIS — E039 Hypothyroidism, unspecified: Secondary | ICD-10-CM | POA: Diagnosis not present

## 2022-02-23 LAB — CBC WITH DIFFERENTIAL/PLATELET
Abs Immature Granulocytes: 0.17 10*3/uL — ABNORMAL HIGH (ref 0.00–0.07)
Basophils Absolute: 0.1 10*3/uL (ref 0.0–0.1)
Basophils Relative: 0 %
Eosinophils Absolute: 0 10*3/uL (ref 0.0–0.5)
Eosinophils Relative: 0 %
HCT: 29.2 % — ABNORMAL LOW (ref 39.0–52.0)
Hemoglobin: 9.1 g/dL — ABNORMAL LOW (ref 13.0–17.0)
Immature Granulocytes: 1 %
Lymphocytes Relative: 87 %
Lymphs Abs: 26.1 10*3/uL — ABNORMAL HIGH (ref 0.7–4.0)
MCH: 29.8 pg (ref 26.0–34.0)
MCHC: 31.2 g/dL (ref 30.0–36.0)
MCV: 95.7 fL (ref 80.0–100.0)
Monocytes Absolute: 0.6 10*3/uL (ref 0.1–1.0)
Monocytes Relative: 2 %
Neutro Abs: 3.1 10*3/uL (ref 1.7–7.7)
Neutrophils Relative %: 10 %
Platelets: 222 10*3/uL (ref 150–400)
RBC: 3.05 MIL/uL — ABNORMAL LOW (ref 4.22–5.81)
RDW: 14.3 % (ref 11.5–15.5)
WBC Morphology: ABNORMAL
WBC: 30.2 10*3/uL — ABNORMAL HIGH (ref 4.0–10.5)
nRBC: 0 % (ref 0.0–0.2)

## 2022-02-23 LAB — COMPREHENSIVE METABOLIC PANEL
ALT: 23 U/L (ref 0–44)
AST: 19 U/L (ref 15–41)
Albumin: 2.2 g/dL — ABNORMAL LOW (ref 3.5–5.0)
Alkaline Phosphatase: 46 U/L (ref 38–126)
Anion gap: 4 — ABNORMAL LOW (ref 5–15)
BUN: 18 mg/dL (ref 8–23)
CO2: 25 mmol/L (ref 22–32)
Calcium: 7.6 mg/dL — ABNORMAL LOW (ref 8.9–10.3)
Chloride: 107 mmol/L (ref 98–111)
Creatinine, Ser: 0.75 mg/dL (ref 0.61–1.24)
GFR, Estimated: >60 mL/min (ref >60)
Glucose, Bld: 84 mg/dL (ref 70–99)
Potassium: 4.8 mmol/L (ref 3.5–5.1)
Sodium: 136 mmol/L (ref 135–145)
Total Bilirubin: 0.3 mg/dL (ref 0.3–1.2)
Total Protein: 4.6 g/dL — ABNORMAL LOW (ref 6.5–8.1)

## 2022-02-23 LAB — CULTURE, BLOOD (ROUTINE X 2)
Culture: NO GROWTH
Culture: NO GROWTH
Special Requests: ADEQUATE
Special Requests: ADEQUATE

## 2022-02-23 LAB — MAGNESIUM: Magnesium: 1.8 mg/dL (ref 1.7–2.4)

## 2022-02-23 LAB — GLUCOSE, CAPILLARY
Glucose-Capillary: 82 mg/dL (ref 70–99)
Glucose-Capillary: 168 mg/dL — ABNORMAL HIGH (ref 70–99)
Glucose-Capillary: 189 mg/dL — ABNORMAL HIGH (ref 70–99)
Glucose-Capillary: 127 mg/dL — ABNORMAL HIGH (ref 70–99)

## 2022-02-23 LAB — PHOSPHORUS: Phosphorus: 3.4 mg/dL (ref 2.5–4.6)

## 2022-02-23 MED ORDER — CEFADROXIL 500 MG PO CAPS: 1000.0000 mg | ORAL_CAPSULE | Freq: Two times a day (BID) | ORAL | 0 refills | Status: DC

## 2022-02-23 NOTE — Progress Notes (Signed)
Physical Therapy Treatment Patient Details Name: Fernando Young MRN: [id number] DOB: Oct 30, 1940 Today's Date: 02/23/2022   History of Present Illness Pt admitted from home with diarrhea and neutropenic fever in setting of enteropathic ecoli bacteremia.  Pt with hx of CHF, DM, HFrEF, PAF, GIB, carotid artery disease, and CLL (chemo currently on hold)    PT Comments    Pt in recliner upon arrival, reports just went to restroom with nursing and agreeable to therapy. Pt tolerates seated BLE strengthening exercises, cues for motor control, decreased endurance, and increased AROM. Focus on transfer training due to difficulty powering to stand, educated pt on quad/glute activation to assist in powering up, BUE assisting. Pt powers up from recliner with min A using rocking momentum, again using rocking momentum and UE assisting with min A to power up from elevated BSC. Pt limited to ambulation to/from bathroom due to bowel urgency; cues for RW management closer to body, upright posture and safety clearing past obstacles at safe distances. Plan to continue progressing as able.   Recommendations for follow up therapy are one component of a multi-disciplinary discharge planning process, led by the attending physician.  Recommendations may be updated based on patient status, additional functional criteria and insurance authorization.  Follow Up Recommendations  Acute inpatient rehab (3hours/day) Can patient physically be transported by private vehicle: Yes   Assistance Recommended at Discharge Frequent or constant Supervision/Assistance  Patient can return home with the following A little help with bathing/dressing/bathroom;Assistance with cooking/housework;Assist for transportation;Help with stairs or ramp for entrance;A little help with walking and/or transfers   Equipment Recommendations  None recommended by PT (TBD at next venue of care)    Recommendations for Other Services       Precautions /  Restrictions Precautions Precautions: Fall Restrictions Weight Bearing Restrictions: No     Mobility  Bed Mobility  General bed mobility comments: in recliner    Transfers Overall transfer level: Needs assistance Equipment used: Rolling walker (2 wheels) Transfers: Sit to/from Stand Sit to Stand: Min assist  General transfer comment: cues for quad/glute engagement to power to stand, BUE assisting to power up, rocking momentum to power up for 4 reps from recliner and 2 reps from [facility]    Ambulation/Gait Ambulation/Gait assistance: Min assist Gait Distance (Feet): 12 Feet (x2) Assistive device: Rolling walker (2 wheels) Gait Pattern/deviations: Step-to pattern, Shuffle, Trunk flexed, Wide base of support Gait velocity: decreased  General Gait Details: pt limited to ambulation to/from restroom due to bowel urgency, demonstrates slow, step-to, shuffling pattern with wide BOS and trunk flexed, generally unsteady, cues for upright posture and maintaining RW at closer proximity for safety   Stairs             Wheelchair Mobility    Modified Rankin (Stroke Patients Only)       Balance Overall balance assessment: Needs assistance  Standing balance support: During functional activity, Bilateral upper extremity supported, Reliant on assistive device for balance Standing balance-Leahy Scale: Poor    Cognition Arousal/Alertness: Awake/alert Behavior During Therapy: WFL for tasks assessed/performed Overall Cognitive Status: Within Functional Limits for tasks assessed  General Comments: Mildly slurred/garbled speech difficult to understand at times.        Exercises General Exercises - Lower Extremity Gluteal Sets: Seated, Strengthening, Both, 10 reps (therapist adding minimal resistance) Long Arc Quad: Seated, Strengthening, Both, 10 reps (2 sets) Hip Flexion/Marching: Seated, Strengthening, Both, 10 reps (2 sets)    General Comments  Pertinent Vitals/Pain Pain  Assessment Pain Assessment: No/denies pain    Home Living                          Prior Function            PT Goals (current goals can now be found in the care plan section) Acute Rehab PT Goals Patient Stated Goal: REgain IND PT Goal Formulation: With patient Time For Goal Achievement: 03/06/22 Potential to Achieve Goals: Good Progress towards PT goals: Progressing toward goals    Frequency    Min 3X/week      PT Plan Discharge plan needs to be updated    Co-evaluation              AM-PAC PT "6 Clicks" Mobility   Outcome Measure  Help needed turning from your back to your side while in a flat bed without using bedrails?: A Little Help needed moving from lying on your back to sitting on the side of a flat bed without using bedrails?: A Little Help needed moving to and from a bed to a chair (including a wheelchair)?: A Little Help needed standing up from a chair using your arms (e.g., wheelchair or bedside chair)?: A Little Help needed to walk in hospital room?: A Lot Help needed climbing 3-5 steps with a railing? : Total 6 Click Score: 15    End of Session Equipment Utilized During Treatment: Gait belt Activity Tolerance: Patient tolerated treatment well Patient left: in chair;with call bell/phone within reach;with chair alarm set Nurse Communication: Mobility status PT Visit Diagnosis: Unsteadiness on feet (R26.81);Muscle weakness (generalized) (M62.81);Difficulty in walking, not elsewhere classified (R26.2)     Time: 1231-1300 PT Time Calculation (min) (ACUTE ONLY): 29 min  Charges:  $Therapeutic Exercise: 8-22 mins $Therapeutic Activity: 8-22 mins                      Tori Vinetta Brach PT, DPT 02/23/22, 1:58 PM

## 2022-02-23 NOTE — Progress Notes (Signed)
Inpatient Rehabilitation Admissions Coordinator    [facility] MD has requested peer to peer with Dr Lonny Prude. I have contacted him by secure chat and Amion with details.  Danne Baxter, RN, MSN Rehab Admissions Coordinator [phone] 02/23/2022 12:41 PM

## 2022-02-23 NOTE — Progress Notes (Signed)
Mobility Specialist - Progress Note   02/23/22 1103  Mobility  Activity Ambulated with assistance in hallway  Level of Assistance Minimal assist, patient does 75% or more  Assistive Device Front wheel walker  Distance Ambulated (ft) 140 ft  Activity Response Tolerated well  Mobility Referral Yes  $Mobility charge 1 Mobility   Pt received in bed and agreed for mobility, bed mobility was Min A, after was Contact guard during ambulation. Pt returned to chair with all needs met and chair alarm set.   Roderick Pee Mobility Specialist

## 2022-02-23 NOTE — Progress Notes (Signed)
Inpatient Rehabilitation Admissions Coordinator   We have received a denial from [facility] for [facility] admit. I spoke with his daughter, [name], by phone and she is aware and would like to pursue SNF. We will sign off at this time. TOC is aware.  Danne Baxter, RN, MSN Rehab Admissions Coordinator [phone] 02/23/2022 5:02 PM

## 2022-02-23 NOTE — Progress Notes (Signed)
PROGRESS NOTE    Fernando Young  QQI:[id number] DOB: March 08, 1941 DOA: 02/17/2022 PCP: Raelene Bott, MD   Assessment and Plan:  Enteropathic E. Coli diarrhea Neutropenic fever E. Coli bacteremia -Continue Ceftriaxone  Hypertension Patient now with hypotension.  Hypotension -Continue midodrine  CLL Squamous cell carcinoma -Supportive care  Chronic combined systolic heart failure Watch fluid status. Stable.  Paroxysmal atrial fibrillation -Continue amiodarone and Eliquis  Anemia of chronic disease/kidney disease Hemoglobin stable  Dark stools No FOBT this admission. History of positive FOBT in September of 2023. History of colonoscopy s/p polypectomy in 2019. Stools documented as brown currently and hemoglobin remains stable. Patient may need to see GI as an outpatient.  Diabetes mellitus, type 2 Controlled. -Continue SSI  Restless leg syndrome -Continue ropinirole   Hypothyroidism -Continue Synthroid  Carotid artery disease -Continue Crestor  BPH History of prostate cancer Noted  Severe malnutrition -Continue protein supplementation  DVT prophylaxis: Eliquis Code Status:   Code Status: DNR Family Communication: None at bedside Disposition Plan: Discharge to SNF when bed is available. Medically stable for discharge   Subjective: Patient sitting in chair in no distress  Objective: BP 112/63 (BP Location: Left Arm)   Pulse 62   Temp 97.9 F (36.6 C) (Oral)   Resp 20   Ht '5\' 10"'$  (1.778 m)   Wt 70.3 kg   SpO2 100%   BMI 22.24 kg/m   Examination:  General exam: Appears calm and comfortable Respiratory system: Clear to auscultation. Respiratory effort normal. Cardiovascular system: S1 & S2 heard, RRR. No murmurs, rubs, gallops or clicks. Gastrointestinal system: Abdomen is soft and nontender. No organomegaly or masses felt. Normal bowel sounds heard. Central nervous system: Alert and oriented. No focal neurological  deficits. Musculoskeletal: No calf tenderness   Data Reviewed: I have personally reviewed following labs and imaging studies  CBC Lab Results  Component Value Date   WBC 30.2 (H) 02/23/2022   RBC 3.05 (L) 02/23/2022   HGB 9.1 (L) 02/23/2022   HCT 29.2 (L) 02/23/2022   MCV 95.7 02/23/2022   MCH 29.8 02/23/2022   PLT 222 02/23/2022   MCHC 31.2 02/23/2022   RDW 14.3 02/23/2022   LYMPHSABS 26.1 (H) 02/23/2022   MONOABS 0.6 02/23/2022   EOSABS 0.0 02/23/2022   BASOSABS 0.1 94/17/4081     Last metabolic panel Lab Results  Component Value Date   NA 136 02/23/2022   K 4.8 02/23/2022   CL 107 02/23/2022   CO2 25 02/23/2022   BUN 18 02/23/2022   CREATININE 0.75 02/23/2022   GLUCOSE 84 02/23/2022   GFRNONAA >60 02/23/2022   CALCIUM 7.6 (L) 02/23/2022   PHOS 3.4 02/23/2022   PROT 4.6 (L) 02/23/2022   ALBUMIN 2.2 (L) 02/23/2022   BILITOT 0.3 02/23/2022   ALKPHOS 46 02/23/2022   AST 19 02/23/2022   ALT 23 02/23/2022   ANIONGAP 4 (L) 02/23/2022    GFR: Estimated Creatinine Clearance: 69.6 mL/min (by C-G formula based on SCr of 0.75 mg/dL).  Recent Results (from the past 240 hour(s))  Culture, blood (Routine X 2) w Reflex to ID Panel     Status: None   Collection Time: 02/18/22  9:16 AM   Specimen: Right Antecubital; Blood  Result Value Ref Range Status   Specimen Description   Final    RIGHT ANTECUBITAL BLOOD Performed at [facility] [address], [place], [place] [address]    Special Requests   Final    BOTTLES DRAWN AEROBIC ONLY  Blood Culture adequate volume Performed at [facility] [address], [place], [place] [address]    Culture   Final    NO GROWTH 5 DAYS Performed at [facility], [place] [address] [place], [place] [address]    Report Status 02/23/2022 FINAL  Final  Culture, blood (Routine X 2) w Reflex to ID Panel     Status: None   Collection Time: 02/18/22  9:16 AM   Specimen: Left Antecubital; Blood  Result  Value Ref Range Status   Specimen Description   Final    LEFT ANTECUBITAL BLOOD Performed at [facility], [place] [address], [place], [place] [address]    Special Requests   Final    BOTTLES DRAWN AEROBIC ONLY Blood Culture adequate volume Performed at [facility] [address], [place], [place] [address]    Culture   Final    NO GROWTH 5 DAYS Performed at [facility], [place] [address], [place], [place] [address]    Report Status 02/23/2022 FINAL  Final  Urine Culture     Status: None   Collection Time: 02/18/22 11:10 AM   Specimen: Urine, Clean Catch  Result Value Ref Range Status   Specimen Description   Final    URINE, CLEAN CATCH Performed at [facility], [place] [address], [place], [place] [address]    Special Requests   Final    Immunocompromised Performed at [facility], [place] [address] [place], [place] [address]    Culture   Final    NO GROWTH Performed at [facility], [place] [address] [place], [place] [address]    Report Status 02/19/2022 FINAL  Final  MRSA Next Gen by PCR, Nasal     Status: Abnormal   Collection Time: 02/18/22  7:30 PM   Specimen: Nasal Mucosa; Nasal Swab  Result Value Ref Range Status   MRSA by PCR Next Gen DETECTED (A) NOT DETECTED Final    Comment: RESULT CALLED TO, READ BACK BY AND VERIFIED WITH: GARLAND,J @ [id number] JMK (NOTE) The GeneXpert MRSA Assay (FDA approved for NASAL specimens only), is one component of a comprehensive MRSA colonization surveillance program. It is not intended to diagnose MRSA infection nor to guide or monitor treatment for MRSA infections. Test performance is not FDA approved in patients less than 61 years old. Performed at [facility], [place] [address], [place], [place] [address]       Radiology Studies: No results found.    LOS: 6 days    Cordelia Poche, MD [facility] 02/23/2022, 6:05 PM   If  7PM-7AM, please contact night-coverage [email or web address]

## 2022-02-23 NOTE — TOC Progression Note (Signed)
Transition of Care [facility]) - Progression Note    Patient Details  Name: Fernando Young MRN: [id number] Date of Birth: 09/14/1940  Transition of Care [facility]) CM/SW [place], RN Phone Number:[phone]  02/23/2022, 8:29 AM  Clinical Narrative:    CM called daughter [name] to follow to make her aware that CM would not pursue any bed offers for SNF st this time due to CIR consult and initiation of insurance auth. Daughter is in agreement and verbalized understanding. No other TOC needs noted at this time. TOC will sign off.         Expected Discharge Plan and Services                                                 Social Determinants of Health (SDOH) Interventions    Readmission Risk Interventions     No data to display

## 2022-02-24 ENCOUNTER — Other Ambulatory Visit: Payer: Self-pay

## 2022-02-24 DIAGNOSIS — D709 Neutropenia, unspecified: Secondary | ICD-10-CM | POA: Diagnosis not present

## 2022-02-24 DIAGNOSIS — E039 Hypothyroidism, unspecified: Secondary | ICD-10-CM | POA: Diagnosis not present

## 2022-02-24 DIAGNOSIS — R7881 Bacteremia: Secondary | ICD-10-CM | POA: Diagnosis not present

## 2022-02-24 DIAGNOSIS — C9110 Chronic lymphocytic leukemia of B-cell type not having achieved remission: Secondary | ICD-10-CM | POA: Diagnosis not present

## 2022-02-24 LAB — GLUCOSE, CAPILLARY
Glucose-Capillary: 88 mg/dL (ref 70–99)
Glucose-Capillary: 240 mg/dL — ABNORMAL HIGH (ref 70–99)
Glucose-Capillary: 91 mg/dL (ref 70–99)
Glucose-Capillary: 113 mg/dL — ABNORMAL HIGH (ref 70–99)

## 2022-02-24 NOTE — Progress Notes (Addendum)
Nutrition Follow-up  DOCUMENTATION CODES:   Severe malnutrition in context of chronic illness  INTERVENTION:   -Continue to encourage PO intakes of meals  -Will place "High Calorie, High Protein" handout in AVS  -Will sign off at this time. Consult with additional needs.  NUTRITION DIAGNOSIS:   Severe Malnutrition related to chronic illness as evidenced by severe fat depletion, severe muscle depletion.  Ongoing.  GOAL:   Patient will meet greater than or equal to 90% of their needs  Progressing.  MONITOR:   PO intake     ASSESSMENT:   Pt is an 81yo M with PMH of CLL, squamous cell carcinoma, CHF, afib, BPH, HTN, DM, hypothyroidism, CAD, RLS, and prostate cancer who presents with neutropenic fever. Per chart review chemotherapy has been on hold for ongoing issues. Blood cultures positive for E Coli  Patient currently consuming 50-100% of meals at this time. Pt did not want ONS at initial assessment. Given pt is still consuming meals, won't order at this time.   Admission weight: 131 lbs Current weight: 154 lbs.  Medications reviewed.  Labs reviewed: CBGs: 82-189   Diet Order:   Diet Order             Diet regular Room service appropriate? Yes; Fluid consistency: Thin  Diet effective now                   EDUCATION NEEDS:   Education needs have been addressed  Skin:  Skin Assessment: Reviewed RN Assessment  Last BM:  10/20  Height:   Ht Readings from Last 1 Encounters:  02/18/22 '5\' 10"'$  (1.778 m)    Weight:   Wt Readings from Last 1 Encounters:  02/24/22 70.2 kg    BMI:  Body mass index is 22.21 kg/m.  Estimated Nutritional Needs:   Kcal:  1965-2290kcal  Protein:  100-130g  Fluid:  >1967m   LClayton Bibles MS, RD, LDN Inpatient Clinical Dietitian Contact information available via Amion

## 2022-02-24 NOTE — Care Management Important Message (Signed)
Important Message  Patient Details IM Letter placed in Patients room Name: Fernando Young MRN: [id number] Date of Birth: 1941-04-06   Medicare Important Message Given:  Yes     Kerin Salen 02/24/2022, 9:40 AM

## 2022-02-24 NOTE — Discharge Instructions (Signed)

## 2022-02-24 NOTE — Progress Notes (Addendum)
Physical Therapy Treatment Patient Details Name: Fernando Young MRN: [id number] DOB: 1940/10/19 Today's Date: 02/24/2022   History of Present Illness Pt admitted from home with diarrhea and neutropenic fever in setting of enteropathic ecoli bacteremia.  Pt with hx of CHF, DM, HFrEF, PAF, GIB, carotid artery disease, and CLL (chemo currently on hold)    PT Comments    Mildly slurred/garbled speech difficult to understand at times. Patient seemed frustrated throughout session. Required min assist +1 with supine to sit, mod. Assist +2 with transfers, and min assist +1 needed to ambulate 25 ft for safety. Patient presented with step to pattern, shuffled, and trunk flexed. Cues needed to maintain RW at closer proximity for safety. 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 Pt will need ST Rehab at SNF to address mobility and functional decline prior to safely returning home.   Recommendations for follow up therapy are one component of a multi-disciplinary discharge planning process, led by the attending physician.  Recommendations may be updated based on patient status, additional functional criteria and insurance authorization.  Follow Up Recommendations  SNF   Assistance Recommended at Discharge Frequent or constant Supervision/Assistance  Patient can return home with the following A little help with bathing/dressing/bathroom;Assistance with cooking/housework;Assist for transportation;Help with stairs or ramp for entrance;A little help with walking and/or transfers   Equipment Recommendations  None recommended by PT    Recommendations for Other Services       Precautions / Restrictions Precautions Precautions: Fall Restrictions Weight Bearing Restrictions: No     Mobility  Bed Mobility Overal bed mobility: Needs Assistance Bed Mobility: Supine to Sit     Supine to sit: Min assist          Transfers Overall transfer level: Needs assistance Equipment  used: Rolling walker (2 wheels) Transfers: Sit to/from Stand Sit to Stand: +2 physical assistance, Mod assist                Ambulation/Gait Ambulation/Gait assistance: Min assist Gait Distance (Feet): 25 Feet Assistive device: Rolling walker (2 wheels) Gait Pattern/deviations: Step-to pattern, Shuffle, Trunk flexed       General Gait Details: Cues needed to maintain RW at closer proximity for safety.   Stairs             Wheelchair Mobility    Modified Rankin (Stroke Patients Only)       Balance                                            Cognition Arousal/Alertness: Awake/alert Behavior During Therapy: WFL for tasks assessed/performed Overall Cognitive Status: Within Functional Limits for tasks assessed                                 General Comments: Mildly slurred/garbled speech difficult to understand at times. Patient seemed agitated throughout session.        Exercises      General Comments        Pertinent Vitals/Pain Pain Assessment Pain Assessment: No/denies pain    Home Living                          Prior Function            PT Goals (current goals  can now be found in the care plan section) Acute Rehab PT Goals Patient Stated Goal: REgain IND PT Goal Formulation: With patient Time For Goal Achievement: 03/06/22 Potential to Achieve Goals: Good Progress towards PT goals: Progressing toward goals    Frequency    Min 3X/week      PT Plan Discharge plan needs to be updated    Co-evaluation              AM-PAC PT "6 Clicks" Mobility   Outcome Measure  Help needed turning from your back to your side while in a flat bed without using bedrails?: A Little Help needed moving from lying on your back to sitting on the side of a flat bed without using bedrails?: A Little Help needed moving to and from a bed to a chair (including a wheelchair)?: A Little Help needed standing  up from a chair using your arms (e.g., wheelchair or bedside chair)?: A Little Help needed to walk in hospital room?: A Little Help needed climbing 3-5 steps with a railing? : Total 6 Click Score: 16    End of Session Equipment Utilized During Treatment: Gait belt Activity Tolerance: Patient tolerated treatment well Patient left: with call bell/phone within reach;in bed;with bed alarm set;with nursing/sitter in room Nurse Communication: Mobility status PT Visit Diagnosis: Unsteadiness on feet (R26.81);Muscle weakness (generalized) (M62.81);Difficulty in walking, not elsewhere classified (R26.2)     Time: 8563-1497 PT Time Calculation (min) (ACUTE ONLY): 17 min  Charges:  $Gait Training: 8-22 mins                     Sophia Pastrana 02/24/2022, 4:06 PM  I agree with the following treatment note.  This session was performed under the supervision of a licensed clinician Rica Koyanagi  PTA [facility] M-F          [phone] Weekend pager [phone]

## 2022-02-24 NOTE — Progress Notes (Signed)
Mobility Specialist - Progress Note   02/24/22 1027  Mobility  Activity Ambulated with assistance in hallway  Level of Assistance Minimal assist, patient does 75% or more  Assistive Device Front wheel walker  Distance Ambulated (ft) 150 ft  Activity Response Tolerated well  Mobility Referral Yes  $Mobility charge 1 Mobility   Pt received in bed and agreed to mobility, no c/o pain nor discomfort during ambulation. Pt returned to chair with all needs met and on chair alarm.   Roderick Pee Mobility Specialist

## 2022-02-24 NOTE — Progress Notes (Signed)
PROGRESS NOTE    Fernando Young  PXT:[id number] DOB: 1940-07-09 DOA: 02/17/2022 PCP: Raelene Bott, MD   Assessment and Plan:  Enteropathic E. Coli diarrhea Neutropenic fever E. Coli bacteremia -Continue Ceftriaxone  Hypertension Patient now with hypotension.  Hypotension -Continue midodrine  CLL Squamous cell carcinoma -Supportive care  Chronic combined systolic heart failure Watch fluid status. Stable.  Paroxysmal atrial fibrillation -Continue amiodarone and Eliquis  Anemia of chronic disease/kidney disease Hemoglobin stable  Dark stools No FOBT this admission. History of positive FOBT in September of 2023. History of colonoscopy s/p polypectomy in 2019. Stools documented as brown currently and hemoglobin remains stable. Patient may need to see GI as an outpatient.  Diabetes mellitus, type 2 Controlled. -Continue SSI  Restless leg syndrome -Continue ropinirole   Hypothyroidism -Continue Synthroid  Carotid artery disease -Continue Crestor  BPH History of prostate cancer Noted  Severe malnutrition -Continue protein supplementation  DVT prophylaxis: Eliquis Code Status:   Code Status: DNR Family Communication: None at bedside Disposition Plan: Discharge to SNF when bed is available. Medically stable for discharge   Subjective: Patient sitting in chair in no distress  Objective: BP 107/67 (BP Location: Left Arm)   Pulse 64   Temp 98.3 F (36.8 C) (Oral)   Resp 20   Ht '5\' 10"'$  (1.778 m)   Wt 70.2 kg   SpO2 100%   BMI 22.21 kg/m   Examination:  General exam: Appears calm and comfortable   Data Reviewed: I have personally reviewed following labs and imaging studies  CBC Lab Results  Component Value Date   WBC 30.2 (H) 02/23/2022   RBC 3.05 (L) 02/23/2022   HGB 9.1 (L) 02/23/2022   HCT 29.2 (L) 02/23/2022   MCV 95.7 02/23/2022   MCH 29.8 02/23/2022   PLT 222 02/23/2022   MCHC 31.2 02/23/2022   RDW 14.3 02/23/2022   LYMPHSABS  26.1 (H) 02/23/2022   MONOABS 0.6 02/23/2022   EOSABS 0.0 02/23/2022   BASOSABS 0.1 62/70/3500     Last metabolic panel Lab Results  Component Value Date   NA 136 02/23/2022   K 4.8 02/23/2022   CL 107 02/23/2022   CO2 25 02/23/2022   BUN 18 02/23/2022   CREATININE 0.75 02/23/2022   GLUCOSE 84 02/23/2022   GFRNONAA >60 02/23/2022   CALCIUM 7.6 (L) 02/23/2022   PHOS 3.4 02/23/2022   PROT 4.6 (L) 02/23/2022   ALBUMIN 2.2 (L) 02/23/2022   BILITOT 0.3 02/23/2022   ALKPHOS 46 02/23/2022   AST 19 02/23/2022   ALT 23 02/23/2022   ANIONGAP 4 (L) 02/23/2022    GFR: Estimated Creatinine Clearance: 69.5 mL/min (by C-G formula based on SCr of 0.75 mg/dL).  Recent Results (from the past 240 hour(s))  Culture, blood (Routine X 2) w Reflex to ID Panel     Status: None   Collection Time: 02/18/22  9:16 AM   Specimen: Right Antecubital; Blood  Result Value Ref Range Status   Specimen Description   Final    RIGHT ANTECUBITAL BLOOD Performed at [facility] [address], [place], [place] [address]    Special Requests   Final    BOTTLES DRAWN AEROBIC ONLY Blood Culture adequate volume Performed at [facility] [address] [place], [place] [address]    Culture   Final    NO GROWTH 5 DAYS Performed at [facility], [place] [address], [place], [place] [address]    Report Status 02/23/2022 FINAL  Final  Culture, blood (Routine X 2) w Reflex to ID Panel     Status: None   Collection Time: 02/18/22  9:16 AM   Specimen: Left Antecubital; Blood  Result Value Ref Range Status   Specimen Description   Final    LEFT ANTECUBITAL BLOOD Performed at [facility], [place] [address] [place], [place] [address]    Special Requests   Final    BOTTLES DRAWN AEROBIC ONLY Blood Culture adequate volume Performed at [facility] [address] [place], [place] [address]    Culture   Final    NO GROWTH 5 DAYS Performed at [facility], [place] [address], [place], [place] [address]    Report Status 02/23/2022 FINAL  Final  Urine Culture     Status: None   Collection Time: 02/18/22 11:10 AM   Specimen: Urine, Clean Catch  Result Value Ref Range Status   Specimen Description   Final    URINE, CLEAN CATCH Performed at [facility], [place] [address] [place], [place] [address]    Special Requests   Final    Immunocompromised Performed at [facility], [place] [address] [place], [place] [address]    Culture   Final    NO GROWTH Performed at [facility], [place] [address], [place], [place] [address]    Report Status 02/19/2022 FINAL  Final  MRSA Next Gen by PCR, Nasal     Status: Abnormal   Collection Time: 02/18/22  7:30 PM   Specimen: Nasal Mucosa; Nasal Swab  Result Value Ref Range Status   MRSA by PCR Next Gen DETECTED (A) NOT DETECTED Final    Comment: RESULT CALLED TO, READ BACK BY AND VERIFIED WITH: GARLAND,J @ [id number] JMK (NOTE) The GeneXpert MRSA Assay (FDA approved for NASAL specimens only), is one component of a comprehensive MRSA colonization surveillance program. It is not intended to diagnose MRSA infection nor to guide or monitor treatment for MRSA infections. Test performance is not FDA approved in patients less than 34 years old. Performed at [facility], [place] [address] [place], [place] [address]       Radiology Studies: No results found.    LOS: 7 days    Cordelia Poche, MD [facility] 02/24/2022, 3:01 PM   If 7PM-7AM, please contact night-coverage [email or web address]

## 2022-02-24 NOTE — TOC Progression Note (Addendum)
Transition of Care [facility]) - Progression Note    Patient Details  Name: Fernando Young MRN: [id number] Date of Birth: December 24, 1940  Transition of Care [facility]) CM/SW [place], RN Phone Number:[phone]  02/24/2022, 10:18 AM  Clinical Narrative:    CM has been informed per MD that patient has been denied CIR per insurance. Patient does have bed offers for SNF. CM at bedside to discuss bed offers. Patient states that he would like for CM to talk with daughter [name]. CM called [name]  [phone] to discuss disposition plan. Daughter verbalizes understanding that patient has not been approved for CIR. Daughter made aware that patient has bend offers in the hub. Daughters 1st choice is [facility] and 2nd choice would be [facility]. CM attempted to call [name] to verify if facility has a bed available. [name] is currently and will call CM back. Will await return call.    [place] returned call and states that she is currently out of the building and will return at 58 and will update me on ability to offer bed.   1115 CM spoke with [name]. Bed request does not show. CM to send request via hub again.  1240 CM has sent bed request again per hub, will await bed offer.   1440 CM has sent message to determine if there is bed availability for this patient. Awaiting response  1456 [facility] [place] can offer bed. CM initiating insurance auth.   [place] has been initiated. [employer] Auth ID [id number] pending        Expected Discharge Plan and Services                                                 Social Determinants of Health (SDOH) Interventions    Readmission Risk Interventions     No data to display

## 2022-02-25 DIAGNOSIS — D709 Neutropenia, unspecified: Secondary | ICD-10-CM | POA: Diagnosis not present

## 2022-02-25 DIAGNOSIS — R5081 Fever presenting with conditions classified elsewhere: Secondary | ICD-10-CM | POA: Diagnosis not present

## 2022-02-25 LAB — GLUCOSE, CAPILLARY
Glucose-Capillary: 83 mg/dL (ref 70–99)
Glucose-Capillary: 138 mg/dL — ABNORMAL HIGH (ref 70–99)
Glucose-Capillary: 103 mg/dL — ABNORMAL HIGH (ref 70–99)

## 2022-02-25 MED ORDER — CEFADROXIL 500 MG PO CAPS: 1000.0000 mg | ORAL_CAPSULE | Freq: Two times a day (BID) | ORAL | 0 refills | Status: AC

## 2022-02-25 MED ORDER — HEPARIN SOD (PORK) LOCK FLUSH 100 UNIT/ML IV SOLN
500.0000 [IU] | INTRAVENOUS | Status: AC | PRN
  Administered 2022-02-25: 500 [IU]
  Filled 2022-02-25: qty 5

## 2022-02-25 NOTE — Hospital Course (Signed)
Fernando Young is a 81 y.o. male with a history of CLL, squamous cell carcinoma, CHF, paroxysmal atrial fibrillation, BPH, hypertension, diabetes mellitus type 2, hypothyroidism, carotid artery disease, restless leg syndrome, prostate cancer.  Patient presented secondary to neutropenic fever and sepsis.  Patient was found to have evidence of E. coli bacteremia secondary to gastroenteritis.  Patient managed with antibiotics with subsequent improvement in symptoms.

## 2022-02-25 NOTE — TOC Transition Note (Signed)
Transition of Care [facility]) - CM/SW Discharge Note   Patient Details  Name: Fernando Young MRN: [id number] Date of Birth: 1940/12/14  Transition of [facility]) CM/SW Contact:  Angelita Ingles, RN Phone Number:[phone]  02/25/2022, 12:17 PM   Clinical Narrative:    Discharge summary has been sent to [place]. Transportation has been arranged per PTAR. Daughter updated and would like a call when PTAR arrives. D/c packet is at nurses station. Nurse updated. No other needs noted at this time   Please call report to [phone] Room # 603   Final next level of care: Skilled Nursing Facility Barriers to Discharge: No Barriers Identified   Patient Goals and CMS Choice     Choice offered to / list presented to : NA  Discharge Placement              Patient chooses bed at: [facility], [place] Patient to be transferred to facility by: [place] Name of family member notified: Exie Parody daughter Patient and family notified of of transfer: 02/25/22  Discharge Plan and Services                DME Arranged: N/A DME Agency: NA       HH Arranged: NA          Social Determinants of Health (SDOH) Interventions     Readmission Risk Interventions     No data to display

## 2022-02-25 NOTE — Progress Notes (Signed)
Gave report to Nadara Mustard, LPN at [facility].  PTAR called by Festus Holts, Case Manger.  Awaiting patient to be picked up, and be transported via Ambulance.

## 2022-02-25 NOTE — Discharge Summary (Signed)
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 *  Hospital Course: Fernando Young is a 81 y.o. male with a history of CLL, squamous cell carcinoma, CHF, paroxysmal atrial fibrillation, BPH, hypertension, diabetes mellitus type 2, hypothyroidism, carotid artery disease, restless leg syndrome, prostate cancer.  Patient presented secondary to neutropenic fever and sepsis.  Patient was found to have evidence of E. coli bacteremia secondary to gastroenteritis.  Patient managed with antibiotics with subsequent improvement in symptoms.  Assessment and Plan:  Enteropathic E. Coli diarrhea Neutropenic fever E. Coli bacteremia Patient treated empirically with Vancomycin and Cefepime. GI pathogen panel was positive for enteropathic E. Coli and blood cultures were positive for E. Coli per outside hospital records. Antibiotics narrowed to Ceftriaxone IV. On discharge, patient transitioned to Cefadroxil to complete a 10 day course of antibiotics.  Sepsis Present on admission with fever, leukopenia and source of bacteremia and infectious diarrhea. Complicated by neutropenia and CLL  treatment.   Hypertension Patient now with hypotension. Lasix and benazepril discontinued.   Hypotension Treated initially with IV fluid resuscitation. Home antihypertensive medications held. Critical care consulted, however patient did not require vasopressor support. Blood pressure is stable on midodrine. No supine hypertension. May be able to come off midodrine as an outpatient.   CLL Squamous cell carcinoma Supportive care while inpatient. Patient to follow-up with oncology as an outpatient.   Chronic combined systolic and diastolic heart failure Stable and without exacerbation. Lasix and benazepril held secondary to hypotension. Watch fluid status carefully.   Paroxysmal atrial fibrillation Continue amiodarone and Eliquis.   Anemia of chronic disease/kidney disease Hemoglobin stable   Dark stools No FOBT this admission. History of positive FOBT in September of 2023. History of colonoscopy s/p polypectomy in 2019. Stools documented as brown currently and hemoglobin remains stable. Patient may need to see GI as an outpatient.   Diabetes mellitus, type 2 Controlled. Continue home regimen of metformin.   Restless leg syndrome Continue ropinirole.   Hypothyroidism Continue Synthroid.   Carotid artery disease Continue Crestor.   BPH History of prostate cancer Noted   Severe malnutrition Dietitian consulted and recommended high calorie/high protein diet.   Consultants: Oncology, Critical care medicine Procedures performed: None  Disposition: Skilled nursing facility Diet recommendation: Cardiac and Carb modified diet   DISCHARGE MEDICATION: Allergies as of 02/25/2022   No Known Allergies      Medication List     STOP taking these medications    benazepril 20 MG tablet Commonly known as: LOTENSIN   Calquence 100 MG tablet Generic drug: acalabrutinib maleate       TAKE these medications    acetaminophen 500 MG tablet Commonly known as:  TYLENOL Take 1,000 mg by mouth every 6 (six) hours as needed for mild pain, moderate pain or fever.   amiodarone 200 MG tablet Commonly known as: PACERONE Take 200 mg by mouth daily.   bismuth subsalicylate 096 GE/36OQ suspension Commonly known as: PEPTO BISMOL Take 30 mLs by mouth every 6 (six) hours as needed. For constipation   cefadroxil 500 MG capsule Commonly known as: DURICEF Take 2 capsules (1,000 mg total) by mouth 2 (two) times daily for 4 days.   cholecalciferol 25 MCG (1000 UNIT) tablet Commonly known as: VITAMIN D3 Take 1,000 Units by mouth daily.   Eliquis 5 MG Tabs tablet Generic drug: apixaban Take 1 tablet by mouth 2 (two) times daily.   Fish Oil 1000 MG Caps Take 1,000 mg by mouth daily.   furosemide 40 MG tablet Commonly known as: LASIX Take 0.5 tablets (20 mg total) by mouth daily. What changed: how much to take   ketoconazole 2 % shampoo Commonly known as: NIZORAL Apply topically 3 (three) times a week.   levothyroxine 75 MCG tablet Commonly known as: SYNTHROID Take 75 mcg by mouth daily before breakfast.   metFORMIN 500 MG 24 hr tablet Commonly known as: GLUCOPHAGE-XR Take 500 mg by mouth 2 (two) times daily.   midodrine 5 MG tablet Commonly known as: PROAMATINE Take 1 tablet (5 mg total) by mouth 3 (three) times daily with meals.   ondansetron 4 MG tablet Commonly known as: ZOFRAN Take 1 tablet (4 mg total) by mouth every 6 (six) hours as needed for nausea.   pantoprazole 40 MG tablet Commonly known as: Protonix Take 1 tablet (40 mg total) by mouth daily.   polyethylene glycol 17 g packet Commonly known as: MIRALAX / GLYCOLAX Take 17 g by mouth daily as needed for mild constipation.   rOPINIRole 0.5 MG tablet Commonly known as: REQUIP Take 0.5-1 mg by mouth See admin instructions. Taking one tablet at bedtime, if no relief take an additional tablet   rosuvastatin 20 MG tablet Commonly known as: CRESTOR Take 10 mg by mouth  daily.   vitamin B-12 500 MCG tablet Commonly known as: CYANOCOBALAMIN Take 500 mcg by mouth daily.   Vitamin C 500 MG Caps Take 500 mg by mouth daily.        Discharge Exam: BP 119/66 (BP Location: Left Arm)   Pulse (!) 57   Temp 98 F (36.7 C) (Oral)   Resp 18   Ht '5\' 10"'$  (1.778 m)   Wt 71.6 kg   SpO2 95%   BMI 22.65 kg/m   General exam: Appears calm and comfortable Respiratory system: Respiratory effort normal.  Condition at discharge: stable  The results of significant diagnostics from this hospitalization (including imaging, microbiology, ancillary and laboratory) are listed below for reference.   Imaging Studies: DG CHEST PORT 1 VIEW  Result Date: 02/20/2022 CLINICAL DATA:  [id number] with shortness of breath. EXAM: PORTABLE CHEST 1 VIEW COMPARISON:  Portable chest yesterday at 6:19 a.m. FINDINGS: There is mild-to-moderate layering bilateral pleural effusions with overlying lung opacities which could be atelectasis or pneumonia. Right perihilar linear atelectasis is unchanged with remaining lungs clear of focal opacity. The heart is enlarged. There is mild central vascular prominence without overt edema. Stable mediastinum with aortic calcifications and tortuosity. Right IJ port catheter tip remains at about the cavoatrial junction. Degenerative change again noted both shoulders. IMPRESSION: Overall aeration is unchanged.  No new or worsening abnormality. There are persistent pleural effusions and overlying atelectasis or  consolidation in the lung bases as well as mild central vascular fullness without overt edema. Stable cardiomegaly. Electronically Signed   By: Telford Nab M.D.   On: 02/20/2022 07:44   DG CHEST PORT 1 VIEW  Result Date: 02/19/2022 CLINICAL DATA:  SOB EXAM: PORTABLE CHEST - 1 VIEW COMPARISON:  02/18/2022 FINDINGS: Stable right IJ port catheter to the distal SVC. Linear atelectasis or scarring in the right mid lung slightly more conspicuous. Ill-defined  interstitial infiltrates laterally in the right mid lung and in the lung bases left greater than right are basically stable. Heart size and mediastinal contours are within normal limits. No pneumothorax. Blunting of right lateral costophrenic angle possibly positional. Visualized bones unremarkable. IMPRESSION: Persistent bilateral interstitial opacities, left greater than right. Electronically Signed   By: Lucrezia Europe M.D.   On: 02/19/2022 08:00   DG CHEST PORT 1 VIEW  Result Date: 02/18/2022 CLINICAL DATA:  Neutropenic fever EXAM: PORTABLE CHEST 1 VIEW COMPARISON:  05/25/20 CXR FINDINGS: Right-sided chest port in place with tip the mid SVC. No pleural effusion. No pneumothorax. Cardiac and mediastinal contours are unchanged compared to prior exam. There is a linear opacity in the right lower lung which is new from prior exam. There is also poor visualization of the medial aspect of the left hemidiaphragm, which could be secondary to an additional superimposed airspace opacity. Increased prominence of the descending right pulmonary artery, possibly positional. No displaced rib fractures. Visualized upper abdomen is unremarkable. IMPRESSION: Linear opacity in the right lower lung most likely represents atelectasis, but in the setting of a neutropenic fever, infection is not entirely excluded. Consider further evaluation with CT chest for more definitive characterization. Electronically Signed   By: Marin Roberts M.D.   On: 02/18/2022 12:19    Microbiology: Results for orders placed or performed during the hospital encounter of 02/17/22  Culture, blood (Routine X 2) w Reflex to ID Panel     Status: None   Collection Time: 02/18/22  9:16 AM   Specimen: Right Antecubital; Blood  Result Value Ref Range Status   Specimen Description   Final    RIGHT ANTECUBITAL BLOOD Performed at [facility], [place] [address], [place], [place] [address]    Special Requests   Final    BOTTLES DRAWN AEROBIC ONLY Blood  Culture adequate volume Performed at [facility] [address] [place], [place] [address]    Culture   Final    NO GROWTH 5 DAYS Performed at [facility], [place] [address], [place], [place] [address]    Report Status 02/23/2022 FINAL  Final  Culture, blood (Routine X 2) w Reflex to ID Panel     Status: None   Collection Time: 02/18/22  9:16 AM   Specimen: Left Antecubital; Blood  Result Value Ref Range Status   Specimen Description   Final    LEFT ANTECUBITAL BLOOD Performed at [facility], [place] [address] [place], [place] [address]    Special Requests   Final    BOTTLES DRAWN AEROBIC ONLY Blood Culture adequate volume Performed at [facility] [address], [place], [place] [address]    Culture   Final    NO GROWTH 5 DAYS Performed at [facility], [place] [address], [place], [place] [address]    Report Status 02/23/2022 FINAL  Final  Urine Culture     Status: None   Collection Time: 02/18/22 11:10 AM   Specimen:  Urine, Clean Catch  Result Value Ref Range Status   Specimen Description   Final    URINE, CLEAN CATCH Performed at [facility], [place] [address] [place], [place] [address]    Special Requests   Final    Immunocompromised Performed at [facility], [place] [address], [place], [place] [address]    Culture   Final    NO GROWTH Performed at [facility], [place] [address], [place], [place] [address]    Report Status 02/19/2022 FINAL  Final  MRSA Next Gen by PCR, Nasal     Status: Abnormal   Collection Time: 02/18/22  7:30 PM   Specimen: Nasal Mucosa; Nasal Swab  Result Value Ref Range Status   MRSA by PCR Next Gen DETECTED (A) NOT DETECTED Final    Comment: RESULT CALLED TO, READ BACK BY AND VERIFIED WITH: GARLAND,J @ [id number] JMK (NOTE) The GeneXpert MRSA Assay (FDA approved for NASAL specimens only), is one component of a comprehensive MRSA colonization  surveillance program. It is not intended to diagnose MRSA infection nor to guide or monitor treatment for MRSA infections. Test performance is not FDA approved in patients less than 65 years old. Performed at [facility], [place] [address], [place], [place] [address]     Labs: CBC: Recent Labs  Lab 02/19/22 0325 02/20/22 0540 02/21/22 0500 02/22/22 0500 02/23/22 0513  WBC 27.2* 30.5* 28.1* 28.7* 30.2*  NEUTROABS 2.3 3.8 3.2 2.7 3.1  HGB 8.4* 9.0* 9.2* 9.3* 9.1*  HCT 27.5* 29.3* 30.4* 30.3* 29.2*  MCV 96.8 96.1 97.1 95.9 95.7  PLT 183 218 235 230 353   Basic Metabolic Panel: Recent Labs  Lab 02/19/22 0325 02/20/22 0540 02/21/22 0500 02/22/22 0500 02/23/22 0513  NA 136 139 138 135 136  K 3.9 4.2 4.4 4.6 4.8  CL 108 112* 110 107 107  CO2 '23 22 23 24 25  '$ GLUCOSE 80 87 91 89 84  BUN '22 19 18 17 18  '$ CREATININE 0.93 0.70 0.82 0.74 0.75  CALCIUM 7.7* 7.2* 7.6* 7.6* 7.6*  MG 1.7 2.0 1.9 1.9 1.8  PHOS 3.2 2.9 3.5 3.3 3.4   Liver Function Tests: Recent Labs  Lab 02/19/22 0325 02/20/22 0540 02/21/22 0500 02/22/22 0500 02/23/22 0513  AST '31 22 22 20 19  '$ ALT 39 32 '30 27 23  '$ ALKPHOS 38 44 47 46 46  BILITOT 0.5 0.4 0.4 0.5 0.3  PROT 4.9* 4.4* 4.9* 4.8* 4.6*  ALBUMIN 2.2* 2.1* 2.3* 2.2* 2.2*   CBG: Recent Labs  Lab 02/24/22 0737 02/24/22 1147 02/24/22 1621 02/24/22 2248 02/25/22 0754  GLUCAP 88 240* 91 113* 83    Discharge time spent: 35 minutes.  Signed: Cordelia Poche, MD [facility] 02/25/2022

## 2022-02-25 NOTE — Progress Notes (Signed)
Occupational Therapy Treatment Patient Details Name: Fernando Young MRN: [id number] DOB: [birth year] Today's Date: 02/25/2022   History of present illness Pt admitted from home with diarrhea and neutropenic fever in setting of enteropathic ecoli bacteremia.  Pt with hx of CHF, DM, HFrEF, PAF, GIB, carotid artery disease, and CLL (chemo currently on hold)   OT comments  Pt making good steady progress with all adls. Pt now at min assist level with most adls; mod assist at times with LE adls. Pt walking to bathroom instead of using BSC and is excited about getting to rehab so he can eventually return home where his son and daughter in law live with him. 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 Will continue to see with focus on adls in standing and improving balance during adls.    Recommendations for follow up therapy are one component of a multi-disciplinary discharge planning process, led by the attending physician.  Recommendations may be updated based on patient status, additional functional criteria and insurance authorization.    Follow Up Recommendations  Skilled nursing-short term rehab (<3 hours/day)    Assistance Recommended at Discharge Intermittent Supervision/Assistance  Patient can return home with the following  A little help with bathing/dressing/bathroom;Assistance with cooking/housework   Equipment Recommendations  None recommended by OT    Recommendations for Other Services      Precautions / Restrictions Precautions Precautions: Fall Restrictions Weight Bearing Restrictions: No       Mobility Bed Mobility               General bed mobility comments: in recliner    Transfers Overall transfer level: Needs assistance Equipment used: Rolling walker (2 wheels) Transfers: Sit to/from Stand Sit to Stand: Mod assist           General transfer  comment: Pt does well from higher surfaces. Pt stood from 3:1 over commode with min guard but required min assist + cues to rock from recliner bc it is lower     Balance Overall balance assessment: Needs assistance Sitting-balance support: No upper extremity supported, Feet supported Sitting balance-Leahy Scale: Good     Standing balance support: During functional activity, Bilateral upper extremity supported, Reliant on assistive device for balance Standing balance-Leahy Scale: Poor Standing balance comment: must have walker                           ADL either performed or assessed with clinical judgement   ADL Overall ADL's : Needs assistance/impaired Eating/Feeding: Set up;Sitting   Grooming: Min guard;Standing;Wash/dry hands;Wash/dry face Grooming Details (indicate cue type and reason): Pt stood at sink to groom for appx 2 minutes with CG. Pt fatigues quickly and is hunched over despite cues to stand straight but did not require physical assist to complete tasks. Upper Body Bathing: Minimal assistance;Sitting   Lower Body Bathing: Moderate assistance;Sit to/from stand;Cueing for compensatory techniques       Lower Body Dressing: Moderate assistance;Sit to/from stand;Cueing for compensatory techniques Lower Body Dressing Details (indicate cue type and reason): Pt states he cannot donn socks but when challeged to do so was able to to with min assist. Pt has become accustomed to staff doing for him in hospital and just needs a small nudge to do for himself. Toilet Transfer: Minimal assistance;Ambulation;Comfort height toilet;Rolling walker (2 wheels);Grab bars Toilet Transfer  Details (indicate cue type and reason): Pt walked to bathroom with walker and min guard; cues to stand straight and not push walker too far ahead. Toileting- Clothing Manipulation and Hygiene: Supervision/safety;Sitting/lateral lean       Functional mobility during ADLs: Min guard;Rolling walker (2  wheels) General ADL Comments: Pt improving with adls; just frustrated with weakness from hospital stay    Extremity/Trunk Assessment Upper Extremity Assessment Upper Extremity Assessment: Overall WFL for tasks assessed   Lower Extremity Assessment Lower Extremity Assessment: Defer to PT evaluation        Vision   Vision Assessment?: No apparent visual deficits   Perception Perception Perception: Within Functional Limits   Praxis Praxis Praxis: Intact    Cognition Arousal/Alertness: Awake/alert Behavior During Therapy: WFL for tasks assessed/performed Overall Cognitive Status: Within Functional Limits for tasks assessed                                 General Comments: Pt frustrated with being in hospital and becoming weak but very agreeable to therapy and demeanor changed significantly once up and walking and conversing.        Exercises      Shoulder Instructions       General Comments Pt most limited by fatigue. Pt kyphotic in standing. 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    Pertinent Vitals/ Pain       Pain Assessment Pain Assessment: No/denies pain  Home Living                                          Prior Functioning/Environment              Frequency  Min 2X/week        Progress Toward Goals  OT Goals(current goals can now be found in the care plan section)  Progress towards OT goals: Progressing toward goals  Acute Rehab OT Goals Patient Stated Goal: to go to rehab and get strong again OT Goal Formulation: With patient Time For Goal Achievement: 03/06/22 Potential to Achieve Goals: Good ADL Goals Pt Will Perform Eating: with supervision;sitting Pt Will Perform Grooming: with supervision;sitting Pt Will Perform Lower Body Dressing: with min assist;sit to/from stand Pt Will Transfer to Toilet: with supervision;bedside commode Pt Will Perform Toileting - Clothing  Manipulation and hygiene: with supervision;sit to/from stand  Plan Discharge plan needs to be updated    Co-evaluation                 AM-PAC OT "6 Clicks" Daily Activity     Outcome Measure   Help from another person eating meals?: None Help from another person taking care of personal grooming?: None Help from another person toileting, which includes using toliet, bedpan, or urinal?: A Little Help from another person bathing (including washing, rinsing, drying)?: A Little Help from another person to put on and taking off regular upper body clothing?: A Little Help from another person to put on and taking off regular lower body clothing?: A Lot 6 Click Score: 19    End of Session Equipment Utilized During Treatment: Rolling walker (2 wheels);Gait belt  OT Visit Diagnosis: Unsteadiness on feet (R26.81);Muscle weakness (generalized) (M62.81)   Activity Tolerance Patient tolerated treatment well   Patient Left in chair;with call bell/phone  within reach   Nurse Communication Mobility status        Time: 1020-1041 OT Time Calculation (min): 21 min  Charges: OT General Charges $OT Visit: 1 Visit OT Treatments $Self Care/Home Management : 8-22 mins   Glenford Peers 02/25/2022, 10:52 AM

## 2022-02-25 NOTE — TOC Progression Note (Addendum)
Transition of Care [facility]) - Progression Note    Patient Details  Name: Fernando Young MRN: [id number] Date of Birth: 1941-04-26  Transition of Care [facility]) CM/SW [place], RN Phone Number:[phone]  02/25/2022, 10:36 AM  Clinical Narrative:    Insurance Josem Kaufmann haas been approved 10/27-10/31 Plan auth ID [id number] [employer] ID [id number] next review 03/01/22. CM has verified with MD that patient is ready for discharge. CM verified with [name] at [facility] that patient has been accepted and has a bed today. Room #603. Daughter [name] has been made aware of discharge.         Expected Discharge Plan and Services           Expected Discharge Date: 02/25/22                                     Social Determinants of Health (SDOH) Interventions    Readmission Risk Interventions     No data to display

## 2022-03-01 ENCOUNTER — Other Ambulatory Visit (HOSPITAL_COMMUNITY): Payer: Self-pay

## 2022-03-03 ENCOUNTER — Inpatient Hospital Stay: Payer: Medicare Other | Attending: Oncology | Admitting: Oncology

## 2022-03-03 ENCOUNTER — Inpatient Hospital Stay: Payer: Medicare Other

## 2022-03-03 ENCOUNTER — Other Ambulatory Visit: Payer: Self-pay | Admitting: Oncology

## 2022-03-03 ENCOUNTER — Encounter: Payer: Self-pay | Admitting: Oncology

## 2022-03-03 VITALS — BP 122/64 | HR 66 | Temp 98.8°F | Resp 20 | Ht 70.0 in | Wt 150.2 lb

## 2022-03-03 DIAGNOSIS — C4440 Unspecified malignant neoplasm of skin of scalp and neck: Secondary | ICD-10-CM

## 2022-03-03 DIAGNOSIS — Z452 Encounter for adjustment and management of vascular access device: Secondary | ICD-10-CM | POA: Insufficient documentation

## 2022-03-03 DIAGNOSIS — C4491 Basal cell carcinoma of skin, unspecified: Secondary | ICD-10-CM | POA: Diagnosis present

## 2022-03-03 DIAGNOSIS — C9110 Chronic lymphocytic leukemia of B-cell type not having achieved remission: Secondary | ICD-10-CM | POA: Diagnosis present

## 2022-03-03 DIAGNOSIS — Z79899 Other long term (current) drug therapy: Secondary | ICD-10-CM | POA: Insufficient documentation

## 2022-03-03 DIAGNOSIS — D509 Iron deficiency anemia, unspecified: Secondary | ICD-10-CM

## 2022-03-03 DIAGNOSIS — D508 Other iron deficiency anemias: Secondary | ICD-10-CM

## 2022-03-03 LAB — CBC AND DIFFERENTIAL
HCT: 27 — AB (ref 41–53)
Hemoglobin: 8.7 — AB (ref 13.5–17.5)
Neutrophils Absolute: 0.87
Platelets: 290 10*3/uL (ref 150–400)
WBC: 17.4

## 2022-03-03 LAB — TSH: TSH: 2.615 u[IU]/mL (ref 0.350–4.500)

## 2022-03-03 LAB — CBC: RBC: 2.96 — AB (ref 3.87–5.11)

## 2022-03-03 MED ORDER — HEPARIN SOD (PORK) LOCK FLUSH 100 UNIT/ML IV SOLN
500.0000 [IU] | Freq: Once | INTRAVENOUS | Status: AC | PRN
  Administered 2022-03-03: 500 [IU]

## 2022-03-03 MED ORDER — SODIUM CHLORIDE 0.9% FLUSH
10.0000 mL | INTRAVENOUS | Status: DC | PRN
  Administered 2022-03-03: 10 mL

## 2022-03-03 NOTE — Progress Notes (Unsigned)
Fernando Young  [address] 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  He received 8 cycles with improvement in his skin lesions.  Unfortunately, he has had progressive worsening fatigue.  He is unable to do his normal activities as desired.  I therefore recommend taking a break from cemiplimab.  We will cancel his infusion this week.       CLL (chronic lymphocytic leukemia) ([place]) We held off on treatment of his CLL due to multiple medical problems.  His skin cancer is now being treated with cemiplimab and he is tolerating this well.  He has worsening anemia, now requiring transfusion.  This is likely due to his CLL, as he received IV iron recently. He started acalabrutinib 100 mg twice daily for his CLL on July 21st. He has had an increase in his white blood cell count since starting acalabrutinib.  An intial increase in the lymphocytosis is common with acalabrutinib.  His white blood cell count has decreased somewhat from 222,000 to 212,000.  He has persistent anemia without definite nutritional deficiency, so this was felt to be due to his CLL.  He then reported black stool.  He was started on pantoprazole 40 mg daily.  He had worsening diarrhea and stool studies revealed enteropathogenic E. Coli, which was treated with ciprofloxacin.   He had persistent melana and stool Hemoccults x3 were positive, so alcalabrutinib was discontinued on September 15th.  His white blood cell count decreased to 109,900 and  his hemoglobin improved to 9.2 on September 21.   He states the melena has resolved and his hemoglobin has improved to 10.2.  His white blood cell count was reported as 15,200, which seemed unlikely, but was 15,400 with a second blood sample.  We are definitely puzzled by this.  We will continue to follow him closely and plan to see him back in 2 to 3 weeks with a CBC and comprehensive metabolic panel.   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  He last received IV iron replacement in June.  He required transfusion in August when his hemoglobin dropped to 8 and he was symptomatic.  His hemoglobin has improved at this time.  He states his stools are not dark as previous.  He likely has decreased blood loss with discontinuation of acalabrutinib.  He remains on apixaban due to atrial fibrillation.       The patient understand and his daughter the plans discussed today and is in agreement with them.  They know to contact our office if he develops concerns prior to his next appointment.      Derwood Kaplan, MD  [facility] AT [facility] [address] [place] [address] Dept: [phone] Dept Fax: [phone]   Orders Placed This Encounter  Procedures   CBC and differential    This external order was created through  the Results Console.   CBC    This external order was created through the Results Console.      CHIEF COMPLAINT:  CC:  Squamous cell skin cancers  Current Treatment: Cemiplimab  HISTORY OF PRESENT ILLNESS:   Oncology History  CLL (chronic lymphocytic leukemia) ([place])  05/25/2015 Initial Diagnosis   CLL (chronic lymphocytic leukemia) ([place])   02/04/2021 Cancer Staging   Staging form: Chronic Lymphocytic Leukemia / Small Lymphocytic Lymphoma, AJCC 8th Edition - Clinical stage from 02/04/2021:  Modified Rai Stage III (Modified Rai risk: High, Binet: Stage A, Lymphocytosis: Present, Adenopathy: Absent, Organomegaly: Absent, Anemia: Present, Thrombocytopenia: Absent) - Signed by Derwood Kaplan, MD on 02/08/2021 Stage prefix: Recurrence Absolute lymphocyte count (ALC) (cells/uL): 100 Hemoglobin (Hgb) (g/dL): 11.4 Platelet count (x10E9/L of blood): 184   Cancer of the skin, basal cell  05/06/2021 Initial Diagnosis   Cancer of the skin, basal cell   08/30/2021 - 12/31/2021 Chemotherapy   Patient is on Treatment Plan : [place] q21d     08/30/2021 -  Chemotherapy   Patient is on Treatment Plan : ADVANCED CUTANEOUS SQUAMOUS CELL CARCINOMA Cemiplimab q21d         INTERVAL HISTORY:  Fernando Young is here today for repeat clinical assessment prior to a 9th cycle of cemiplimab.  He reports worsening fatigue and general weakness, especially of his legs.  He is unable to walk as much as previous.  He previously underwent physical therapy and states he is doing exercises at home without improvement.  He still has intermittent diarrhea, but improved from previous.  He is using Pepto-Bismol as needed.  He denies persistent dark stool.  He continues pantoprazole daily. He denies fevers or chills. He denies pain. His appetite is good. His weight has decreased 3 pounds over last 3 weeks .  REVIEW OF SYSTEMS:  Review of Systems  Constitutional:  Positive for fatigue. Negative for appetite change, chills, fever and unexpected weight change.  HENT:   Negative for lump/mass, mouth sores and sore throat.   Respiratory:  Negative for cough and shortness of breath.   Cardiovascular:  Negative for chest pain and leg swelling.  Gastrointestinal:  Negative for abdominal pain, constipation, diarrhea, nausea and vomiting.  Genitourinary:  Negative for difficulty urinating, dysuria, frequency and hematuria.   Musculoskeletal:  Negative for arthralgias, back pain and myalgias.  Skin:  Negative for  itching, rash and wound.  Neurological:  Negative for dizziness, extremity weakness, headaches, light-headedness and numbness.  Hematological:  Negative for adenopathy.  Psychiatric/Behavioral:  Negative for depression and sleep disturbance. The patient is not nervous/anxious.      VITALS:  Blood pressure 122/64, pulse 66, temperature 98.8 F (37.1 C), resp. rate 20, height '5\' 10"'$  (1.778 m), weight 150 lb 3.2 oz (68.1 kg), SpO2 97 %.  Wt Readings from Last 3 Encounters:  03/03/22 150 lb 3.2 oz (68.1 kg)  02/25/22 157 lb 13.6 oz (71.6 kg)  02/10/22 145 lb 9.6 oz (66 kg)    Body mass index is 21.55 kg/m.  Performance status (ECOG): 2 - Symptomatic, <50% confined to bed  PHYSICAL EXAM:  Physical Exam Vitals and nursing note reviewed.  Constitutional:      General: He is not in acute distress.    Appearance: Normal appearance. He is normal weight.  HENT:     Head: Normocephalic and atraumatic.     Mouth/Throat:     Mouth: Mucous membranes are moist.     Pharynx: Oropharynx is clear. No  oropharyngeal exudate or posterior oropharyngeal erythema.  Eyes:     General: No scleral icterus.    Extraocular Movements: Extraocular movements intact.     Conjunctiva/sclera: Conjunctivae normal.     Pupils: Pupils are equal, round, and reactive to light.  Cardiovascular:     Rate and Rhythm: Normal rate and regular rhythm.     Heart sounds: Normal heart sounds. No murmur heard.    No friction rub. No gallop.  Pulmonary:     Effort: Pulmonary effort is normal.     Breath sounds: Normal breath sounds. No wheezing, rhonchi or rales.  Abdominal:     General: Bowel sounds are normal. There is no distension.     Palpations: Abdomen is soft. There is no hepatomegaly, splenomegaly or mass.     Tenderness: There is no abdominal tenderness.  Musculoskeletal:        General: Normal range of motion.     Cervical back: Normal range of motion and neck supple. No tenderness.     Right lower leg: No  edema.     Left lower leg: No edema.  Lymphadenopathy:     Cervical: No cervical adenopathy.     Upper Body:     Right upper body: No supraclavicular or axillary adenopathy.     Left upper body: No supraclavicular or axillary adenopathy.  Skin:    General: Skin is warm and dry.     Coloration: Skin is not jaundiced.     Findings: Lesion present. No rash.     Comments: The lesions of his right face are doing quite well.  There is a single raised lesion of the left cheek.  He has persistent lesions of the nose with overlying eschar.  Neurological:     Mental Status: He is alert and oriented to person, place, and time.     Cranial Nerves: No cranial nerve deficit.  Psychiatric:        Mood and Affect: Mood normal.        Behavior: Behavior normal.        Thought Content: Thought content normal.     LABS:      Latest Ref Rng & Units 03/03/2022   12:00 AM 02/23/2022    5:13 AM 02/22/2022    5:00 AM  CBC  WBC  17.4     30.2  28.7   Hemoglobin 13.5 - 17.5 8.7     9.1  9.3   Hematocrit 41 - 53 27     29.2  30.3   Platelets 150 - 400 K/uL 290     222  230      This result is from an external source.      Latest Ref Rng & Units 02/23/2022    5:13 AM 02/22/2022    5:00 AM 02/21/2022    5:00 AM  CMP  Glucose 70 - 99 mg/dL 84  89  91   BUN 8 - 23 mg/dL '18  17  18   '$ Creatinine 0.61 - 1.24 mg/dL 0.75  0.74  0.82   Sodium 135 - 145 mmol/L 136  135  138   Potassium 3.5 - 5.1 mmol/L 4.8  4.6  4.4   Chloride 98 - 111 mmol/L 107  107  110   CO2 22 - 32 mmol/L '25  24  23   '$ Calcium 8.9 - 10.3 mg/dL 7.6  7.6  7.6   Total Protein 6.5 - 8.1 g/dL 4.6  4.8  4.9  Total Bilirubin 0.3 - 1.2 mg/dL 0.3  0.5  0.4   Alkaline Phos 38 - 126 U/L 46  46  47   AST 15 - 41 U/L '19  20  22   '$ ALT 0 - 44 U/L '23  27  30      '$ No results found for: "CEA1", "CEA" / No results found for: "CEA1", "CEA" No results found for: "PSA1" No results found for: "UYE334" No results found for: "CAN125"  No results  found for: "TOTALPROTELP", "ALBUMINELP", "A1GS", "A2GS", "BETS", "BETA2SER", "GAMS", "MSPIKE", "SPEI" Lab Results  Component Value Date   TIBC 267 12/31/2021   TIBC 291 09/15/2021   TIBC 341 06/04/2021   FERRITIN 248 12/31/2021   FERRITIN 48 09/15/2021   FERRITIN 53 06/04/2021   IRONPCTSAT 22 12/31/2021   IRONPCTSAT 13 (L) 09/15/2021   IRONPCTSAT 11 (L) 06/04/2021   Lab Results  Component Value Date   LDH 166 08/03/2020    STUDIES:  No results found.    HISTORY:   Past Medical History:  Diagnosis Date   B12 deficiency    Cancer of the skin, 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   CHF (congestive heart failure) ([facility])    Diabetes mellitus without complication ([place])    History of prostate cancer    Hyperlipidemia    Hypertension    Hypoglycemia 05/06/2021   Iron deficiency anemia 02/20/2020   Leucocytosis    Leukemia, lymphocytic, chronic ([facility])     Past Surgical History:  Procedure Laterality Date   PROSTATE BIOPSY      Family History  Problem Relation Age of Onset   Ovarian cancer Mother 24   Cancer Sister 19    Social History:  reports that he has quit smoking. He has never used smokeless tobacco. He reports that he does not currently use alcohol. He reports that he does not use drugs.The patient is accompanied by his daughter today.  Allergies: No Known Allergies  Current Medications: Current Outpatient Medications  Medication Sig Dispense Refill   acetaminophen (TYLENOL) 500 MG tablet Take 1,000 mg by mouth every 6 (six) hours as needed for mild pain, moderate pain or fever.     amiodarone (PACERONE) 200 MG tablet Take 200 mg by mouth daily.     apixaban (ELIQUIS) 5 MG TABS tablet Take 1 tablet by mouth 2 (two) times daily.     Ascorbic Acid (VITAMIN C) 500 MG CAPS Take 500 mg by mouth daily.     bismuth subsalicylate (PEPTO BISMOL) 262 MG/15ML suspension Take 30 mLs by mouth every 6 (six) hours as needed. For constipation     cholecalciferol (VITAMIN D3) 25 MCG (1000 UNIT)  tablet Take 1,000 Units by mouth daily.     furosemide (LASIX) 40 MG tablet Take 0.5 tablets (20 mg total) by mouth daily. 30 tablet 0   ketoconazole (NIZORAL) 2 % shampoo Apply topically 3 (three) times a week.     levothyroxine (SYNTHROID) 75 MCG tablet Take 75 mcg by mouth daily before breakfast.     metFORMIN (GLUCOPHAGE-XR) 500 MG 24 hr tablet Take 500 mg by mouth 2 (two) times daily.     midodrine (PROAMATINE) 5 MG tablet Take 1 tablet (5 mg total) by mouth 3 (three) times daily with meals.     Omega-3 Fatty Acids (FISH OIL) 1000 MG CAPS Take 1,000 mg by mouth daily.     ondansetron (ZOFRAN) 4 MG tablet Take 1 tablet (4 mg total) by mouth every 6 (six) hours as needed for  nausea. 20 tablet 0   pantoprazole (PROTONIX) 40 MG tablet TAKE 1 TABLET BY MOUTH EVERY DAY 90 tablet 1   polyethylene glycol (MIRALAX / GLYCOLAX) 17 g packet Take 17 g by mouth daily as needed for mild constipation. 14 each 0   rOPINIRole (REQUIP) 0.5 MG tablet Take 0.5-1 mg by mouth See admin instructions. Taking one tablet at bedtime, if no relief take an additional tablet     rosuvastatin (CRESTOR) 20 MG tablet Take 10 mg by mouth daily.     vitamin B-12 (CYANOCOBALAMIN) 500 MCG tablet Take 500 mcg by mouth daily.     Current Facility-Administered Medications  Medication Dose Route Frequency Provider Last Rate Last Admin   sodium chloride flush (NS) 0.9 % injection 10 mL  10 mL Intracatheter PRN Mosher, Kelli A, PA-C   10 mL at 03/03/22 1504

## 2022-03-04 ENCOUNTER — Other Ambulatory Visit: Payer: Self-pay

## 2022-03-05 LAB — T4: T4, Total: 8.1 ug/dL (ref 4.5–12.0)

## 2022-03-07 ENCOUNTER — Other Ambulatory Visit (HOSPITAL_COMMUNITY): Payer: Self-pay

## 2022-03-07 ENCOUNTER — Other Ambulatory Visit: Payer: Self-pay | Admitting: Hematology and Oncology

## 2022-03-08 ENCOUNTER — Other Ambulatory Visit: Payer: Self-pay

## 2022-03-08 ENCOUNTER — Encounter: Payer: Self-pay | Admitting: Oncology

## 2022-03-24 ENCOUNTER — Encounter: Payer: Self-pay | Admitting: Oncology

## 2022-03-24 ENCOUNTER — Encounter: Payer: Self-pay | Admitting: Hematology and Oncology

## 2022-03-31 ENCOUNTER — Ambulatory Visit: Payer: Medicare Other | Admitting: Oncology

## 2022-03-31 ENCOUNTER — Other Ambulatory Visit: Payer: Self-pay

## 2022-03-31 ENCOUNTER — Inpatient Hospital Stay: Payer: Medicare Other

## 2022-04-04 ENCOUNTER — Other Ambulatory Visit: Payer: Self-pay

## 2022-04-07 NOTE — Progress Notes (Deleted)
[facility]  [address] 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  He received 8 cycles with improvement in his skin lesions.  Unfortunately, he has had progressive worsening fatigue and debility and so we have stopped his infusions.     CLL (chronic lymphocytic leukemia) ([place]) We held off on treatment of his CLL due to multiple medical problems.  His skin cancer is now being treated with cemiplimab and he is tolerating this well.  He has worsening anemia, now requiring transfusion.  This is likely due to his CLL, as he received IV iron recently. He started acalabrutinib 100 mg twice daily for his CLL on July 21st. He has had an increase in his white blood cell count since starting acalabrutinib.  An intial increase in the lymphocytosis is common with acalabrutinib.  His white blood cell count has decreased somewhat from 222,000 to 212,000.  He has persistent anemia without definite nutritional deficiency, so this was felt to be due to his CLL.  He then reported black stool.  He was started on pantoprazole 40 mg daily.  He had worsening diarrhea and stool studies revealed enteropathogenic E. Coli, which was treated with ciprofloxacin.   He had persistent melena and stool Hemoccults x3 were positive, so alcalabrutinib was discontinued on September 15th.  His white blood cell count decreased to 109,900 and his hemoglobin improved to 9.2 on September 21. His WBC's came down to 15,000 last month, then 17.4  with an Anderson of 870, and now 49.9 with 84.5% lymphocytes, with hemoglobin up to 10.6.   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  He last received IV iron replacement in June.  He required transfusion in August when his hemoglobin dropped to 8 and he was symptomatic. His hemoglobin was 8.7 last time, but is improved at 10.6 today.    Plan: We will keep him off the infusions and the acalabrutinib and simply monitor him and offer supportive care. I will see him back in 4 weeks with CBC and CMP, and we will have them run it here so we can see results the same day..  The patient understand and his daughter the plans discussed today and is in agreement with them.  They know to contact our office if he develops concerns prior to his next appointment.   Derwood Kaplan, MD  [facility] [address] [place] [address] Dept: [phone] Dept Fax: [phone]   Orders Placed This Encounter  Procedures   CBC with Differential ([facility] Only)    Standing Status:   Future    Standing Expiration Date:   05/27/2023   CMP ([facility] only)    Standing Status:   Future    Standing Expiration Date:   05/27/2023   T4    Standing Status:   Future  Standing Expiration Date:   05/27/2023   TSH    Standing Status:   Future    Standing Expiration Date:   05/27/2023      CHIEF COMPLAINT:  CC:  Squamous cell skin cancers, CLL  Current Treatment: Surveillance  HISTORY OF PRESENT ILLNESS:   Oncology History  CLL (chronic lymphocytic leukemia) ([employer])  05/25/2015 Initial Diagnosis   CLL (chronic lymphocytic leukemia) ([place])   02/04/2021 Cancer Staging   Staging form: Chronic Lymphocytic Leukemia / Small Lymphocytic Lymphoma, AJCC 8th Edition - Clinical stage from 02/04/2021: Modified Rai Stage III (Modified Rai  risk: High, Binet: Stage A, Lymphocytosis: Present, Adenopathy: Absent, Organomegaly: Absent, Anemia: Present, Thrombocytopenia: Absent) - Signed by Derwood Kaplan, MD on 02/08/2021 Stage prefix: Recurrence Absolute lymphocyte count (ALC) (cells/uL): 100 Hemoglobin (Hgb) (g/dL): 11.4 Platelet count (x10E9/L of blood): 184   Cancer of the skin, basal cell  05/06/2021 Initial Diagnosis   Cancer of the skin, basal cell   08/30/2021 - 12/31/2021 Chemotherapy   Patient is on Treatment Plan : Reserve q21d     08/30/2021 -  Chemotherapy   Patient is on Treatment Plan : ADVANCED CUTANEOUS SQUAMOUS CELL CARCINOMA Cemiplimab q21d         INTERVAL HISTORY:  Fernando Young is here today for repeat clinical assessment after stopping acalabrutinib and cemiplimab. He was hospitalized, then in rehab, and back home now. He had cataract surgery this week. He states he still is feeling tired and weak. His white count went up 49.9 today from 17.5, hemoglobin is 10.6, otherwise the rest is normal. We will continue to monitor him and discuss further treatment within the next couple visits.  He denies fevers or chills. He denies pain. He denies persistent dark stool. His appetite is good. His daughter is with him as always. The skin of his face looks fairly good except for scaling and reaction of his nose.  REVIEW OF SYSTEMS:  Review of Systems  Constitutional:  Positive for fatigue. Negative for appetite change, chills, fever and unexpected weight change.  HENT:   Negative for lump/mass, mouth sores and sore throat.   Respiratory:  Negative for cough and shortness of breath.   Cardiovascular:  Negative for chest pain and leg swelling.  Gastrointestinal:  Negative for abdominal pain, constipation, diarrhea, nausea and vomiting.  Genitourinary:  Negative for difficulty urinating, dysuria, frequency and hematuria.   Musculoskeletal:  Negative for arthralgias, back pain and myalgias.  Skin:  Negative for  itching, rash and wound.  Neurological:  Negative for dizziness, extremity weakness, headaches, light-headedness and numbness.  Hematological:  Negative for adenopathy.  Psychiatric/Behavioral:  Negative for depression and sleep disturbance. The patient is not nervous/anxious.      VITALS:  Blood pressure (!) 105/58, pulse (!) 57, temperature 97.6 F (36.4 C), temperature source Oral, resp. rate 20, height '5\' 10"'$  (1.778 m), weight 145 lb 12.8 oz (66.1 kg), SpO2 100 %.  Wt Readings from Last 3 Encounters:  05/12/22 150 lb 3.2 oz (68.1 kg)  04/14/22 145 lb 12.8 oz (66.1 kg)  03/03/22 150 lb 3.2 oz (68.1 kg)    Body mass index is 20.92 kg/m.  Performance status (ECOG): 2 - Symptomatic, <50% confined to bed  PHYSICAL EXAM:  Physical Exam Vitals and nursing note reviewed.  Constitutional:      General: He is not in acute distress.    Appearance: Normal appearance. He is normal weight.  HENT:     Head: Normocephalic and atraumatic.  Mouth/Throat:     Mouth: Mucous membranes are moist.     Pharynx: Oropharynx is clear. No oropharyngeal exudate or posterior oropharyngeal erythema.  Eyes:     General: No scleral icterus.    Extraocular Movements: Extraocular movements intact.     Conjunctiva/sclera: Conjunctivae normal.     Pupils: Pupils are equal, round, and reactive to light.  Cardiovascular:     Rate and Rhythm: Normal rate and regular rhythm.     Heart sounds: Normal heart sounds. No murmur heard.    No friction rub. No gallop.  Pulmonary:     Effort: Pulmonary effort is normal.     Breath sounds: Normal breath sounds. No wheezing, rhonchi or rales.  Abdominal:     General: Bowel sounds are normal. There is no distension.     Palpations: Abdomen is soft. There is no hepatomegaly, splenomegaly or mass.     Tenderness: There is no abdominal tenderness.  Musculoskeletal:        General: Normal range of motion.     Cervical back: Normal range of motion and neck supple. No  tenderness.     Right lower leg: Edema present.     Left lower leg: Edema present.     Comments: 1+ edema of his legs. Skin lesions on his legs too  Lymphadenopathy:     Cervical: No cervical adenopathy.     Upper Body:     Right upper body: No supraclavicular or axillary adenopathy.     Left upper body: No supraclavicular or axillary adenopathy.  Skin:    General: Skin is warm and dry.     Coloration: Skin is not jaundiced.     Findings: Lesion present. No rash.     Comments: Lesions on the ear and nose and on the hair line.  Neurological:     Mental Status: He is alert and oriented to person, place, and time.     Cranial Nerves: No cranial nerve deficit.  Psychiatric:        Mood and Affect: Mood normal.        Behavior: Behavior normal.        Thought Content: Thought content normal.     LABS:      Latest Ref Rng & Units 05/12/2022   12:00 AM 04/14/2022    4:20 PM 03/03/2022   12:00 AM  CBC  WBC  57.7     49.9     17.4      Hemoglobin 13.5 - 17.5 10.1     10.6     8.7      Hematocrit 41 - 53 33     33     27      Platelets 150 - 400 K/uL 267     242     290         This result is from an external source.      Latest Ref Rng & Units 05/12/2022   12:00 AM 04/14/2022    3:28 PM 02/23/2022    5:13 AM  CMP  Glucose 70 - 99 mg/dL  93  84   BUN 4 - '21 23     25  18   '$ Creatinine 0.6 - 1.3 1.0     1.13  0.75   Sodium 137 - 147 138     140  136   Potassium 3.5 - 5.1 mEq/L 4.5     4.4  4.8   Chloride 99 - 108  106     108  107   CO2 13 - '22 25     26  25   '$ Calcium 8.7 - 10.7 8.4     8.7  7.6   Total Protein 6.5 - 8.1 g/dL  5.5  4.6   Total Bilirubin 0.3 - 1.2 mg/dL  0.5  0.3   Alkaline Phos 25 - 125 56     52  46   AST 14 - 40 '23     19  19   '$ ALT 10 - 40 U/L '18     18  23      '$ This result is from an external source.     No results found for: "CEA1", "CEA" / No results found for: "CEA1", "CEA" No results found for: "PSA1" No results found for: "ZOX096" No results  found for: "CAN125"  No results found for: "TOTALPROTELP", "ALBUMINELP", "A1GS", "A2GS", "BETS", "BETA2SER", "GAMS", "MSPIKE", "SPEI" Lab Results  Component Value Date   TIBC 267 12/31/2021   TIBC 291 09/15/2021   TIBC 341 06/04/2021   FERRITIN 248 12/31/2021   FERRITIN 48 09/15/2021   FERRITIN 53 06/04/2021   IRONPCTSAT 22 12/31/2021   IRONPCTSAT 13 (L) 09/15/2021   IRONPCTSAT 11 (L) 06/04/2021   Lab Results  Component Value Date   LDH 166 08/03/2020    STUDIES:  No results found.    HISTORY:   Past Medical History:  Diagnosis Date   B12 deficiency    Cancer of the skin, 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   CHF (congestive heart failure) ([facility])    Diabetes mellitus without complication ([place])    History of prostate cancer    Hyperlipidemia    Hypertension    Hypoglycemia 05/06/2021   Iron deficiency anemia 02/20/2020   Leucocytosis    Leukemia, lymphocytic, chronic ([facility])     Past Surgical History:  Procedure Laterality Date   PROSTATE BIOPSY      Family History  Problem Relation Age of Onset   Ovarian cancer Mother 61   Cancer Sister 81    Social History:  reports that he has quit smoking. He has never used smokeless tobacco. He reports that he does not currently use alcohol. He reports that he does not use drugs.The patient is accompanied by his daughter today.  Allergies: No Known Allergies  Current Medications: Current Outpatient Medications  Medication Sig Dispense Refill   acetaminophen (TYLENOL) 500 MG tablet Take 1,000 mg by mouth every 6 (six) hours as needed for mild pain, moderate pain or fever.     amiodarone (PACERONE) 200 MG tablet Take 200 mg by mouth daily.     apixaban (ELIQUIS) 5 MG TABS tablet Take 1 tablet by mouth 2 (two) times daily.     Ascorbic Acid (VITAMIN C) 500 MG CAPS Take 500 mg by mouth daily.     bismuth subsalicylate (PEPTO BISMOL) 262 MG/15ML suspension Take 30 mLs by mouth every 6 (six) hours as needed. For constipation      cholecalciferol (VITAMIN D3) 25 MCG (1000 UNIT) tablet Take 1,000 Units by mouth daily.     furosemide (LASIX) 40 MG tablet Take 0.5 tablets (20 mg total) by mouth daily. 30 tablet 0   ketoconazole (NIZORAL) 2 % shampoo Apply topically 3 (three) times a week.     levothyroxine (SYNTHROID) 75 MCG tablet Take 75 mcg by mouth daily before breakfast.     metFORMIN (GLUCOPHAGE-XR) 500 MG 24 hr tablet Take 500 mg by mouth 2 (two)  times daily.     midodrine (PROAMATINE) 5 MG tablet Take 1 tablet (5 mg total) by mouth 3 (three) times daily with meals.     Omega-3 Fatty Acids (FISH OIL) 1000 MG CAPS Take 1,000 mg by mouth daily.     ondansetron (ZOFRAN) 4 MG tablet Take 1 tablet (4 mg total) by mouth every 6 (six) hours as needed for nausea. 20 tablet 0   pantoprazole (PROTONIX) 40 MG tablet TAKE 1 TABLET BY MOUTH EVERY DAY 90 tablet 1   polyethylene glycol (MIRALAX / GLYCOLAX) 17 g packet Take 17 g by mouth daily as needed for mild constipation. 14 each 0   rOPINIRole (REQUIP) 0.5 MG tablet Take 0.5-1 mg by mouth See admin instructions. Taking one tablet at bedtime, if no relief take an additional tablet     rosuvastatin (CRESTOR) 20 MG tablet Take 10 mg by mouth daily.     vitamin B-12 (CYANOCOBALAMIN) 500 MCG tablet Take 500 mcg by mouth daily.     No current facility-administered medications for this visit.         I,Gabriella Ballesteros,acting as a scribe for Derwood Kaplan, MD.,have documented all relevant documentation on the behalf of Derwood Kaplan, MD,as directed by  Derwood Kaplan, MD while in the presence of Derwood Kaplan, MD.

## 2022-04-14 ENCOUNTER — Inpatient Hospital Stay (INDEPENDENT_AMBULATORY_CARE_PROVIDER_SITE_OTHER): Payer: Medicare Other | Admitting: Oncology

## 2022-04-14 ENCOUNTER — Other Ambulatory Visit: Payer: Self-pay | Admitting: Oncology

## 2022-04-14 ENCOUNTER — Inpatient Hospital Stay: Payer: Medicare Other | Attending: Oncology

## 2022-04-14 ENCOUNTER — Encounter: Payer: Self-pay | Admitting: Oncology

## 2022-04-14 VITALS — BP 105/58 | HR 57 | Temp 97.6°F | Resp 20 | Ht 70.0 in | Wt 145.8 lb

## 2022-04-14 DIAGNOSIS — Z79899 Other long term (current) drug therapy: Secondary | ICD-10-CM | POA: Insufficient documentation

## 2022-04-14 DIAGNOSIS — C4491 Basal cell carcinoma of skin, unspecified: Secondary | ICD-10-CM | POA: Insufficient documentation

## 2022-04-14 DIAGNOSIS — C9110 Chronic lymphocytic leukemia of B-cell type not having achieved remission: Secondary | ICD-10-CM

## 2022-04-14 DIAGNOSIS — D508 Other iron deficiency anemias: Secondary | ICD-10-CM

## 2022-04-14 LAB — CBC: RBC: 3.65 — AB (ref 3.87–5.11)

## 2022-04-14 LAB — CMP (CANCER CENTER ONLY)
ALT: 18 U/L (ref 0–44)
AST: 19 U/L (ref 15–41)
Albumin: 3.2 g/dL — ABNORMAL LOW (ref 3.5–5.0)
Alkaline Phosphatase: 52 U/L (ref 38–126)
Anion gap: 6 (ref 5–15)
BUN: 25 mg/dL — ABNORMAL HIGH (ref 8–23)
CO2: 26 mmol/L (ref 22–32)
Calcium: 8.7 mg/dL — ABNORMAL LOW (ref 8.9–10.3)
Chloride: 108 mmol/L (ref 98–111)
Creatinine: 1.13 mg/dL (ref 0.61–1.24)
GFR, Estimated: >60 mL/min (ref >60)
Glucose, Bld: 93 mg/dL (ref 70–99)
Potassium: 4.4 mmol/L (ref 3.5–5.1)
Sodium: 140 mmol/L (ref 135–145)
Total Bilirubin: 0.5 mg/dL (ref 0.3–1.2)
Total Protein: 5.5 g/dL — ABNORMAL LOW (ref 6.5–8.1)

## 2022-04-14 LAB — SAMPLE TO BLOOD BANK

## 2022-04-14 LAB — CBC AND DIFFERENTIAL
HCT: 33 — AB (ref 41–53)
Hemoglobin: 10.6 — AB (ref 13.5–17.5)
Platelets: 242 10*3/uL (ref 150–400)
WBC: 49.9

## 2022-04-14 LAB — TSH: TSH: 2.417 u[IU]/mL (ref 0.350–4.500)

## 2022-04-16 LAB — T4: T4, Total: 8.7 ug/dL (ref 4.5–12.0)

## 2022-04-17 ENCOUNTER — Other Ambulatory Visit: Payer: Self-pay

## 2022-04-19 ENCOUNTER — Other Ambulatory Visit: Payer: Self-pay

## 2022-05-04 ENCOUNTER — Encounter: Payer: Self-pay | Admitting: Oncology

## 2022-05-10 NOTE — Progress Notes (Signed)
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  His dermatologist at 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  He received 8 cycles with improvement in his skin lesions.  Unfortunately, he has had progressive worsening fatigue and debility and so we have stopped his infusions.     CLL (chronic lymphocytic leukemia) ([place]) We held off on treatment of his CLL due to multiple medical problems.  His skin cancer is now being treated with cemiplimab and he is tolerating this well.  He has worsening anemia, now requiring transfusion.  This is likely due to his CLL, as he received IV iron recently. He started acalabrutinib 100 mg twice daily for his CLL on July 21st. He has had an increase in his white blood cell count since starting acalabrutinib.  An intial increase in the lymphocytosis is common with acalabrutinib.  His white blood cell count has decreased somewhat from 222,000 to 212,000.  He has persistent anemia without definite nutritional deficiency, so this was felt to be due to his CLL.  He then reported black stool.  He was started on pantoprazole 40 mg daily.  He had worsening diarrhea and stool studies revealed enteropathogenic E. Coli, which was treated with ciprofloxacin.   He had persistent melana and stool Hemoccults x3 were positive, so alcalabrutinib was discontinued on September 15th.  His white blood cell count decreased to 109,900 and his hemoglobin improved to 9.2 on September 21. His WBC came down to 15,000. Since stopping  treatment, his white count is rising again to 49,000, from 17000 in late October and now 57000. His ANC has improved.   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  He last received IV iron replacement in June.  He required transfusion in August when his hemoglobin dropped to 8 and he was symptomatic. His hemoglobin has been better since December. It has decreased from 10.6 to 10.1 today.   Plan: We will keep him off the infusions and the acalabrutinib, and simply monitor him and offer supportive care. His white count last visit was 49.9 but today was 57.7.  I will see him back in 1 month with CBC and CMP.  The patient understand and his daughter the plans discussed today and is in agreement with them.  They know to contact our office if he develops concerns prior to his next appointment.   Fernando Kaplan, MD  [place] [address] [place] [address] Dept: [phone] Dept Fax: [phone]   No orders of the defined types were placed in this encounter.     CHIEF COMPLAINT:  CC:  Squamous cell skin cancers  Current Treatment: Cemiplimab  HISTORY OF PRESENT ILLNESS:   Oncology History  CLL (chronic lymphocytic leukemia) ([place])  05/25/2015 Initial Diagnosis   CLL (chronic lymphocytic leukemia) ([place])   02/04/2021 Cancer Staging   Staging form: Chronic Lymphocytic Leukemia /  Small Lymphocytic Lymphoma, AJCC 8th Edition - Clinical stage from 02/04/2021: Modified Rai Stage III (Modified Rai risk: High, Binet: Stage A, Lymphocytosis: Present, Adenopathy: Absent, Organomegaly: Absent, Anemia: Present, Thrombocytopenia: Absent) - Signed by Fernando Kaplan, MD on 02/08/2021 Stage prefix: Recurrence Absolute lymphocyte count (ALC) (cells/uL): 100 Hemoglobin (Hgb) (g/dL): 11.4 Platelet count (x10E9/L of  blood): 184   Cancer of the skin, basal cell  05/06/2021 Initial Diagnosis   Cancer of the skin, basal cell   08/30/2021 - 12/31/2021 Chemotherapy   Patient is on Treatment Plan : [place] q21d     08/30/2021 -  Chemotherapy   Patient is on Treatment Plan : ADVANCED CUTANEOUS SQUAMOUS CELL CARCINOMA Cemiplimab q21d         INTERVAL HISTORY:  Fernando Young is here today for repeat clinical assessment after stopping acalabrutinib for his CLL, and cemiplimab for his skin cancers months ago. He states he has been doing well but he does note that he feels weak and tired. His white count last visit was 49.9 but today was 57.7. Hemoglobin last time was 10.6, and now is 10.1, with a normal platelet count. His ANC is 7500, but 86% of the [facility] are lymphocytes. CMP is fairly unremarkable.  His daughter notes that his body is having trouble healing. He's been on multiple antibiotics for the boil underneath his leg but it has not improved. He has a skin tear of his left upper arm which has been extremely slow to heal and he has home health nurses coming our regularly. They have used Tegaderm but still no improvements. Overall the skin cancers of his face are doing better, but his nose still looks terrible. They will be seeing the dermatologist next month. His appetite is good. His weight has been stable at 150. His daughter is with him as always. The skin of his face looks fairly good except for scaling and reaction of his nose. He continues pantoprazole daily. He denies fevers or chills. He denies pain.    REVIEW OF SYSTEMS:  Review of Systems  Constitutional:  Positive for fatigue. Negative for appetite change, chills, fever and unexpected weight change.  HENT:   Negative for lump/mass, mouth sores and sore throat.   Respiratory:  Negative for cough and shortness of breath.   Cardiovascular:  Negative for chest pain and leg swelling.  Gastrointestinal:  Negative for abdominal pain, constipation,  diarrhea, nausea and vomiting.  Genitourinary:  Negative for difficulty urinating, dysuria, frequency and hematuria.   Musculoskeletal:  Negative for arthralgias, back pain and myalgias.  Skin:  Negative for itching, rash and wound.  Neurological:  Negative for dizziness, extremity weakness, headaches, light-headedness and numbness.  Hematological:  Negative for adenopathy.  Psychiatric/Behavioral:  Negative for depression and sleep disturbance. The patient is not nervous/anxious.      VITALS:  Blood pressure 107/69, pulse 89, temperature 97.6 F (36.4 C), temperature source Oral, resp. rate 18, height '5\' 10"'$  (1.778 m), weight 150 lb 3.2 oz (68.1 kg), SpO2 100 %.  Wt Readings from Last 3 Encounters:  05/12/22 150 lb 3.2 oz (68.1 kg)  04/14/22 145 lb 12.8 oz (66.1 kg)  03/03/22 150 lb 3.2 oz (68.1 kg)    Body mass index is 21.55 kg/m.  Performance status (ECOG): 2 - Symptomatic, <50% confined to bed  PHYSICAL EXAM:  Physical Exam Vitals and nursing note reviewed.  Constitutional:      General: He is not in acute distress.    Appearance: Normal appearance. He  is normal weight.  HENT:     Head: Normocephalic and atraumatic.     Mouth/Throat:     Mouth: Mucous membranes are moist.     Pharynx: Oropharynx is clear. No oropharyngeal exudate or posterior oropharyngeal erythema.  Eyes:     General: No scleral icterus.    Extraocular Movements: Extraocular movements intact.     Conjunctiva/sclera: Conjunctivae normal.     Pupils: Pupils are equal, round, and reactive to light.  Cardiovascular:     Rate and Rhythm: Normal rate and regular rhythm.     Heart sounds: Normal heart sounds. No murmur heard.    No friction rub. No gallop.  Pulmonary:     Effort: Pulmonary effort is normal.     Breath sounds: Normal breath sounds. No wheezing, rhonchi or rales.  Abdominal:     General: Bowel sounds are normal. There is no distension.     Palpations: Abdomen is soft. There is no  hepatomegaly, splenomegaly or mass.     Tenderness: There is no abdominal tenderness.  Musculoskeletal:        General: Normal range of motion.     Cervical back: Normal range of motion and neck supple. No tenderness.     Right lower leg: No edema.     Left lower leg: No edema.  Lymphadenopathy:     Cervical: No cervical adenopathy.     Upper Body:     Right upper body: No supraclavicular or axillary adenopathy.     Left upper body: No supraclavicular or axillary adenopathy.  Skin:    General: Skin is warm and dry.     Coloration: Skin is not jaundiced.     Findings: Lesion present. No rash.     Comments: The lesions of his right face are doing quite well.  There is a single raised lesion of the left cheek.  He has persistent lesions of the nose with overlying eschar.  Neurological:     Mental Status: He is alert and oriented to person, place, and time.     Cranial Nerves: No cranial nerve deficit.  Psychiatric:        Mood and Affect: Mood normal.        Behavior: Behavior normal.        Thought Content: Thought content normal.     LABS:      Latest Ref Rng & Units 05/12/2022   12:00 AM 04/14/2022    4:20 PM 03/03/2022   12:00 AM  CBC  WBC  57.7     49.9     17.4      Hemoglobin 13.5 - 17.5 10.1     10.6     8.7      Hematocrit 41 - 53 33     33     27      Platelets 150 - 400 K/uL 267     242     290         This result is from an external source.      Latest Ref Rng & Units 05/12/2022   12:00 AM 04/14/2022    3:28 PM 02/23/2022    5:13 AM  CMP  Glucose 70 - 99 mg/dL  93  84   BUN 4 - '21 23     25  18   '$ Creatinine 0.6 - 1.3 1.0     1.13  0.75   Sodium 137 - 147 138     140  136   Potassium 3.5 - 5.1 mEq/L 4.5     4.4  4.8   Chloride 99 - 108 106     108  107   CO2 13 - '22 25     26  25   '$ Calcium 8.7 - 10.7 8.4     8.7  7.6   Total Protein 6.5 - 8.1 g/dL  5.5  4.6   Total Bilirubin 0.3 - 1.2 mg/dL  0.5  0.3   Alkaline Phos 25 - 125 56     52  46   AST 14 - 40  '23     19  19   '$ ALT 10 - 40 U/L '18     18  23      '$ This result is from an external source.     No results found for: "CEA1", "CEA" / No results found for: "CEA1", "CEA" No results found for: "PSA1" No results found for: "BJS283" No results found for: "CAN125"  No results found for: "TOTALPROTELP", "ALBUMINELP", "A1GS", "A2GS", "BETS", "BETA2SER", "GAMS", "MSPIKE", "SPEI" Lab Results  Component Value Date   TIBC 267 12/31/2021   TIBC 291 09/15/2021   TIBC 341 06/04/2021   FERRITIN 248 12/31/2021   FERRITIN 48 09/15/2021   FERRITIN 53 06/04/2021   IRONPCTSAT 22 12/31/2021   IRONPCTSAT 13 (L) 09/15/2021   IRONPCTSAT 11 (L) 06/04/2021   Lab Results  Component Value Date   LDH 166 08/03/2020    STUDIES:  No results found.    HISTORY:   Past Medical History:  Diagnosis Date   B12 deficiency    Cancer of the skin, 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   CHF (congestive heart failure) ([facility])    Diabetes mellitus without complication ([place])    History of prostate cancer    Hyperlipidemia    Hypertension    Hypoglycemia 05/06/2021   Iron deficiency anemia 02/20/2020   Leucocytosis    Leukemia, lymphocytic, chronic ([facility])     Past Surgical History:  Procedure Laterality Date   PROSTATE BIOPSY      Family History  Problem Relation Age of Onset   Ovarian cancer Mother 86   Cancer Sister 60    Social History:  reports that he has quit smoking. He has never used smokeless tobacco. He reports that he does not currently use alcohol. He reports that he does not use drugs.The patient is accompanied by his daughter today.  Allergies: No Known Allergies  Current Medications: Current Outpatient Medications  Medication Sig Dispense Refill   acetaminophen (TYLENOL) 500 MG tablet Take 1,000 mg by mouth every 6 (six) hours as needed for mild pain, moderate pain or fever.     amiodarone (PACERONE) 200 MG tablet Take 200 mg by mouth daily.     apixaban (ELIQUIS) 5 MG TABS tablet Take 1 tablet by  mouth 2 (two) times daily.     Ascorbic Acid (VITAMIN C) 500 MG CAPS Take 500 mg by mouth daily.     bismuth subsalicylate (PEPTO BISMOL) 262 MG/15ML suspension Take 30 mLs by mouth every 6 (six) hours as needed. For constipation     cholecalciferol (VITAMIN D3) 25 MCG (1000 UNIT) tablet Take 1,000 Units by mouth daily.     furosemide (LASIX) 40 MG tablet Take 0.5 tablets (20 mg total) by mouth daily. 30 tablet 0   ketoconazole (NIZORAL) 2 % shampoo Apply topically 3 (three) times a week.     levothyroxine (SYNTHROID) 75 MCG tablet Take 75 mcg by  mouth daily before breakfast.     metFORMIN (GLUCOPHAGE-XR) 500 MG 24 hr tablet Take 500 mg by mouth 2 (two) times daily.     midodrine (PROAMATINE) 5 MG tablet Take 1 tablet (5 mg total) by mouth 3 (three) times daily with meals.     Omega-3 Fatty Acids (FISH OIL) 1000 MG CAPS Take 1,000 mg by mouth daily.     ondansetron (ZOFRAN) 4 MG tablet Take 1 tablet (4 mg total) by mouth every 6 (six) hours as needed for nausea. 20 tablet 0   pantoprazole (PROTONIX) 40 MG tablet TAKE 1 TABLET BY MOUTH EVERY DAY 90 tablet 1   polyethylene glycol (MIRALAX / GLYCOLAX) 17 g packet Take 17 g by mouth daily as needed for mild constipation. 14 each 0   rOPINIRole (REQUIP) 0.5 MG tablet Take 0.5-1 mg by mouth See admin instructions. Taking one tablet at bedtime, if no relief take an additional tablet     rosuvastatin (CRESTOR) 20 MG tablet Take 10 mg by mouth daily.     vitamin B-12 (CYANOCOBALAMIN) 500 MCG tablet Take 500 mcg by mouth daily.     No current facility-administered medications for this visit.       I,Gabriella Ballesteros,acting as a scribe for Fernando Kaplan, MD.,have documented all relevant documentation on the behalf of Fernando Kaplan, MD,as directed by  Fernando Kaplan, MD while in the presence of Fernando Kaplan, MD.

## 2022-05-12 ENCOUNTER — Telehealth: Payer: Self-pay | Admitting: Oncology

## 2022-05-12 ENCOUNTER — Inpatient Hospital Stay: Payer: Medicare Other

## 2022-05-12 ENCOUNTER — Encounter: Payer: Self-pay | Admitting: Oncology

## 2022-05-12 ENCOUNTER — Inpatient Hospital Stay: Payer: Medicare Other | Attending: Oncology | Admitting: Oncology

## 2022-05-12 ENCOUNTER — Other Ambulatory Visit: Payer: Self-pay | Admitting: Oncology

## 2022-05-12 VITALS — BP 107/69 | HR 89 | Temp 97.6°F | Resp 18 | Ht 70.0 in | Wt 150.2 lb

## 2022-05-12 DIAGNOSIS — C9110 Chronic lymphocytic leukemia of B-cell type not having achieved remission: Secondary | ICD-10-CM

## 2022-05-12 DIAGNOSIS — C4491 Basal cell carcinoma of skin, unspecified: Secondary | ICD-10-CM

## 2022-05-12 DIAGNOSIS — D500 Iron deficiency anemia secondary to blood loss (chronic): Secondary | ICD-10-CM

## 2022-05-12 DIAGNOSIS — C4440 Unspecified malignant neoplasm of skin of scalp and neck: Secondary | ICD-10-CM

## 2022-05-12 LAB — CBC AND DIFFERENTIAL
HCT: 33 — AB (ref 41–53)
Hemoglobin: 10.1 — AB (ref 13.5–17.5)
Neutrophils Absolute: 6.35
Platelets: 267 10*3/uL (ref 150–400)
WBC: 57.7

## 2022-05-12 LAB — HEPATIC FUNCTION PANEL
ALT: 18 U/L (ref 10–40)
AST: 23 (ref 14–40)
Alkaline Phosphatase: 56 (ref 25–125)
Bilirubin, Total: 0.4

## 2022-05-12 LAB — BASIC METABOLIC PANEL
BUN: 23 — AB (ref 4–21)
CO2: 25 — AB (ref 13–22)
Chloride: 106 (ref 99–108)
Creatinine: 1 (ref 0.6–1.3)
Glucose: 145
Potassium: 4.5 mEq/L (ref 3.5–5.1)
Sodium: 138 (ref 137–147)

## 2022-05-12 LAB — COMPREHENSIVE METABOLIC PANEL
Albumin: 3.3 — AB (ref 3.5–5.0)
Calcium: 8.4 — AB (ref 8.7–10.7)

## 2022-05-12 LAB — CBC: RBC: 3.59 — AB (ref 3.87–5.11)

## 2022-05-12 NOTE — Progress Notes (Signed)
CRITICAL VALUE STICKER  CRITICAL VALUE:   WBC 57.7  RECEIVER (on-site recipient of call):  Fenix Rorke D. RN  DATE & TIME NOTIFIED:   05/12/22 @ 1558  MESSENGER (representative from lab):  Miranda RH lalb  MD NOTIFIED:   Dr. Hinton Rao  TIME OF NOTIFICATION:  1600  RESPONSE:  Patient scheduled to see Dr. Hinton Rao

## 2022-05-12 NOTE — Telephone Encounter (Signed)
Patient has been scheduled for follow-up visit per 05/12/22 LOS.  Pt given an appt calendar with date and time.  

## 2022-05-13 ENCOUNTER — Other Ambulatory Visit: Payer: Self-pay

## 2022-05-15 ENCOUNTER — Encounter: Payer: Self-pay | Admitting: Hematology and Oncology

## 2022-05-15 ENCOUNTER — Encounter: Payer: Self-pay | Admitting: Oncology

## 2022-05-15 NOTE — Progress Notes (Signed)
[facility]  [address] 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  He received 8 cycles with improvement in his skin lesions.  Unfortunately, he has had progressive worsening fatigue and debility and so we have stopped his infusions.     CLL (chronic lymphocytic leukemia) ([place]) We held off on treatment of his CLL due to multiple medical problems.  His skin cancer is now being treated with cemiplimab and he is tolerating this well.  He has worsening anemia, now requiring transfusion.  This is likely due to his CLL, as he received IV iron recently. He started acalabrutinib 100 mg twice daily for his CLL on July 21st. He has had an increase in his white blood cell count since starting acalabrutinib.  An intial increase in the lymphocytosis is common with acalabrutinib.  His white blood cell count has decreased somewhat from 222,000 to 212,000.  He has persistent anemia without definite nutritional deficiency, so this was felt to be due to his CLL.  He then reported black stool.  He was started on pantoprazole 40 mg daily.  He had worsening diarrhea and stool studies revealed enteropathogenic E. Coli, which was treated with ciprofloxacin.   He had persistent melena and stool Hemoccults x3 were positive, so alcalabrutinib was discontinued on September 15th.  His white blood cell count decreased to 109,900 and his hemoglobin improved to 9.2 on September 21. His WBC's came down to 15,000 last month,  then 17.4 with an [place] of 870, and now 49.9 with 84.5% lymphocytes, with hemoglobin up to 10.6.   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  He last received IV iron replacement in June.  He required transfusion in August when his hemoglobin dropped to 8 and he was symptomatic. His hemoglobin was 8.7 last time, but is improved at 10.6 today.    Plan: We will keep him off the infusions and the acalabrutinib and simply monitor him and offer supportive care. I will see him back in 4 weeks with CBC and CMP, and we will have them run it here so we can see results the same day..  The patient understand and his daughter the plans discussed today and is in agreement with them.  They know to contact our office if he develops concerns prior to his next appointment.     Fernando Kaplan, MD  [facility] [address] [place] [address] Dept: [phone] Dept Fax: [phone]         Orders Placed This Encounter  Procedures   CBC with Differential ([facility] Only)      Standing Status:   Future      Standing Expiration Date:   05/27/2023   CMP ([facility] only)      Standing Status:   Future  Standing Expiration Date:   05/27/2023   T4      Standing Status:   Future      Standing Expiration Date:   05/27/2023   TSH      Standing Status:   Future      Standing Expiration Date:   05/27/2023        CHIEF COMPLAINT:  CC:  Squamous cell skin cancers, CLL   Current Treatment: Surveillance   HISTORY OF PRESENT ILLNESS:       Oncology History  CLL (chronic lymphocytic leukemia) ([employer])  05/25/2015 Initial Diagnosis    CLL (chronic lymphocytic leukemia) ([facility])    02/04/2021 Cancer Staging    Staging form: Chronic Lymphocytic Leukemia / Small Lymphocytic Lymphoma, AJCC 8th Edition - Clinical stage from  02/04/2021: Modified Rai Stage III (Modified Rai risk: High, Binet: Stage A, Lymphocytosis: Present, Adenopathy: Absent, Organomegaly: Absent, Anemia: Present, Thrombocytopenia: Absent) - Signed by Fernando Kaplan, MD on 02/08/2021 Stage prefix: Recurrence Absolute lymphocyte count (ALC) (cells/uL): 100 Hemoglobin (Hgb) (g/dL): 11.4 Platelet count (x10E9/L of blood): 184    Cancer of the skin, basal cell  05/06/2021 Initial Diagnosis    Cancer of the skin, basal cell    08/30/2021 - 12/31/2021 Chemotherapy    Patient is on Treatment Plan : [place] q21d     08/30/2021 -  Chemotherapy    Patient is on Treatment Plan : ADVANCED CUTANEOUS SQUAMOUS CELL CARCINOMA Cemiplimab q21d           INTERVAL HISTORY:  Fernando Young is here today for repeat clinical assessment after stopping acalabrutinib and cemiplimab. He was hospitalized, then in rehab, and back home now. He had cataract surgery this week. He states he still is feeling tired and weak. His white count went up 49.9 today from 17.5, hemoglobin is 10.6, otherwise the rest is normal. We will continue to monitor him and discuss further treatment within the next couple visits.  He denies fevers or chills. He denies pain. He denies persistent dark stool. His appetite is good. His daughter is with him as always. The skin of his face looks fairly good except for scaling and reaction of his nose.   REVIEW OF SYSTEMS:  Review of Systems  Constitutional:  Positive for fatigue. Negative for appetite change, chills, fever and unexpected weight change.  HENT:   Negative for lump/mass, mouth sores and sore throat.   Respiratory:  Negative for cough and shortness of breath.   Cardiovascular:  Negative for chest pain and leg swelling.  Gastrointestinal:  Negative for abdominal pain, constipation, diarrhea, nausea and vomiting.  Genitourinary:  Negative for difficulty urinating, dysuria, frequency and hematuria.   Musculoskeletal:  Negative for  arthralgias, back pain and myalgias.  Skin:  Negative for itching, rash and wound.  Neurological:  Negative for dizziness, extremity weakness, headaches, light-headedness and numbness.  Hematological:  Negative for adenopathy.  Psychiatric/Behavioral:  Negative for depression and sleep disturbance. The patient is not nervous/anxious.       VITALS:  Blood pressure (!) 105/58, pulse (!) 57, temperature 97.6 F (36.4 C), temperature source Oral, resp. rate 20, height '5\' 10"'$  (1.778 m), weight 145 lb 12.8 oz (66.1 kg), SpO2 100 %.     Wt Readings from Last 3 Encounters:  05/12/22 150 lb 3.2 oz (68.1 kg)  04/14/22 145 lb 12.8 oz (66.1 kg)  03/03/22 150 lb 3.2 oz (68.1 kg)    Body mass index is 20.92 kg/m.   Performance status (ECOG): 2 -  Symptomatic, <50% confined to bed   PHYSICAL EXAM:  Physical Exam Vitals and nursing note reviewed.  Constitutional:      General: He is not in acute distress.    Appearance: Normal appearance. He is normal weight.  HENT:     Head: Normocephalic and atraumatic.     Mouth/Throat:     Mouth: Mucous membranes are moist.     Pharynx: Oropharynx is clear. No oropharyngeal exudate or posterior oropharyngeal erythema.  Eyes:     General: No scleral icterus.    Extraocular Movements: Extraocular movements intact.     Conjunctiva/sclera: Conjunctivae normal.     Pupils: Pupils are equal, round, and reactive to light.  Cardiovascular:     Rate and Rhythm: Normal rate and regular rhythm.     Heart sounds: Normal heart sounds. No murmur heard.    No friction rub. No gallop.  Pulmonary:     Effort: Pulmonary effort is normal.     Breath sounds: Normal breath sounds. No wheezing, rhonchi or rales.  Abdominal:     General: Bowel sounds are normal. There is no distension.     Palpations: Abdomen is soft. There is no hepatomegaly, splenomegaly or mass.     Tenderness: There is no abdominal tenderness.  Musculoskeletal:        General: Normal range of  motion.     Cervical back: Normal range of motion and neck supple. No tenderness.     Right lower leg: Edema present.     Left lower leg: Edema present.     Comments: 1+ edema of his legs. Skin lesions on his legs too  Lymphadenopathy:     Cervical: No cervical adenopathy.     Upper Body:     Right upper body: No supraclavicular or axillary adenopathy.     Left upper body: No supraclavicular or axillary adenopathy.  Skin:    General: Skin is warm and dry.     Coloration: Skin is not jaundiced.     Findings: Lesion present. No rash.     Comments: Lesions on the ear and nose and on the hair line.  Neurological:     Mental Status: He is alert and oriented to person, place, and time.     Cranial Nerves: No cranial nerve deficit.  Psychiatric:        Mood and Affect: Mood normal.        Behavior: Behavior normal.        Thought Content: Thought content normal.        LABS:        Latest Ref Rng & Units 05/12/2022   12:00 AM 04/14/2022    4:20 PM 03/03/2022   12:00 AM  CBC  WBC   57.7     49.9     17.4      Hemoglobin 13.5 - 17.5 10.1     10.6     8.7      Hematocrit 41 - 53 33     33     27      Platelets 150 - 400 K/uL 267     242     290         This result is from an external source.        Latest Ref Rng & Units 05/12/2022   12:00 AM 04/14/2022    3:28 PM 02/23/2022    5:13 AM  CMP  Glucose 70 - 99 mg/dL   93  84   BUN 4 - '21 23     25  18   '$ Creatinine 0.6 - 1.3 1.0     1.13  0.75   Sodium 137 - 147 138     140  136   Potassium 3.5 - 5.1 mEq/L 4.5     4.4  4.8   Chloride 99 - 108 106     108  107   CO2 13 - '22 25     26  25   '$ Calcium 8.7 - 10.7 8.4     8.7  7.6   Total Protein 6.5 - 8.1 g/dL   5.5  4.6   Total Bilirubin 0.3 - 1.2 mg/dL   0.5  0.3   Alkaline Phos 25 - 125 56     52  46   AST 14 - 40 '23     19  19   '$ ALT 10 - 40 U/L '18     18  23      '$ This result is from an external source.        Recent Labs  No results found for: "CEA1", "CEA"   /  Last  Labs  No results found for: "CEA1", "CEA"   Recent Labs  No results found for: "PSA1"   Recent Labs  No results found for: "FGH829"   Recent Labs  No results found for: "CAN125"    Recent Labs  No results found for: "TOTALPROTELP", "ALBUMINELP", "A1GS", "A2GS", "BETS", "BETA2SER", "GAMS", "MSPIKE", "SPEI"   Recent Labs       Lab Results  Component Value Date    TIBC 267 12/31/2021    TIBC 291 09/15/2021    TIBC 341 06/04/2021    FERRITIN 248 12/31/2021    FERRITIN 48 09/15/2021    FERRITIN 53 06/04/2021    IRONPCTSAT 22 12/31/2021    IRONPCTSAT 13 (L) 09/15/2021    IRONPCTSAT 11 (L) 06/04/2021      Recent Labs       Lab Results  Component Value Date    LDH 166 08/03/2020        STUDIES:  Imaging Results  No results found.        HISTORY:        Past Medical History:  Diagnosis Date   B12 deficiency     Cancer of the skin, 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   CHF (congestive heart failure) ([facility])     Diabetes mellitus without complication ([facility])     History of prostate cancer     Hyperlipidemia     Hypertension     Hypoglycemia 05/06/2021   Iron deficiency anemia 02/20/2020   Leucocytosis     Leukemia, lymphocytic, chronic ([facility])             Past Surgical History:  Procedure Laterality Date   PROSTATE BIOPSY               Family History  Problem Relation Age of Onset   Ovarian cancer Mother 25   Cancer Sister 46      Social History:  reports that he has quit smoking. He has never used smokeless tobacco. He reports that he does not currently use alcohol. He reports that he does not use drugs.The patient is accompanied by his daughter today.   Allergies: No Known Allergies   Current Medications:       Current Outpatient Medications  Medication Sig Dispense Refill   acetaminophen (TYLENOL) 500 MG tablet Take 1,000 mg  by mouth every 6 (six) hours as needed for mild pain, moderate pain or fever.       amiodarone (PACERONE) 200 MG tablet Take 200 mg by mouth  daily.       apixaban (ELIQUIS) 5 MG TABS tablet Take 1 tablet by mouth 2 (two) times daily.       Ascorbic Acid (VITAMIN C) 500 MG CAPS Take 500 mg by mouth daily.       bismuth subsalicylate (PEPTO BISMOL) 262 MG/15ML suspension Take 30 mLs by mouth every 6 (six) hours as needed. For constipation       cholecalciferol (VITAMIN D3) 25 MCG (1000 UNIT) tablet Take 1,000 Units by mouth daily.       furosemide (LASIX) 40 MG tablet Take 0.5 tablets (20 mg total) by mouth daily. 30 tablet 0   ketoconazole (NIZORAL) 2 % shampoo Apply topically 3 (three) times a week.       levothyroxine (SYNTHROID) 75 MCG tablet Take 75 mcg by mouth daily before breakfast.       metFORMIN (GLUCOPHAGE-XR) 500 MG 24 hr tablet Take 500 mg by mouth 2 (two) times daily.       midodrine (PROAMATINE) 5 MG tablet Take 1 tablet (5 mg total) by mouth 3 (three) times daily with meals.       Omega-3 Fatty Acids (FISH OIL) 1000 MG CAPS Take 1,000 mg by mouth daily.       ondansetron (ZOFRAN) 4 MG tablet Take 1 tablet (4 mg total) by mouth every 6 (six) hours as needed for nausea. 20 tablet 0   pantoprazole (PROTONIX) 40 MG tablet TAKE 1 TABLET BY MOUTH EVERY DAY 90 tablet 1   polyethylene glycol (MIRALAX / GLYCOLAX) 17 g packet Take 17 g by mouth daily as needed for mild constipation. 14 each 0   rOPINIRole (REQUIP) 0.5 MG tablet Take 0.5-1 mg by mouth See admin instructions. Taking one tablet at bedtime, if no relief take an additional tablet       rosuvastatin (CRESTOR) 20 MG tablet Take 10 mg by mouth daily.       vitamin B-12 (CYANOCOBALAMIN) 500 MCG tablet Take 500 mcg by mouth daily.        No current facility-administered medications for this visit.              I,Gabriella Ballesteros,acting as a scribe for Fernando Kaplan, MD.,have documented all relevant documentation on the behalf of Fernando Kaplan, MD,as directed by  Fernando Kaplan, MD while in the presence of Fernando Kaplan, MD.

## 2022-06-03 ENCOUNTER — Encounter: Payer: Self-pay | Admitting: Hematology and Oncology

## 2022-06-03 ENCOUNTER — Encounter: Payer: Self-pay | Admitting: Oncology

## 2022-06-06 NOTE — Progress Notes (Signed)
[facility]  [address] 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  He received 8 cycles with improvement in his skin lesions.  Unfortunately, he has had progressive worsening fatigue and debility and so we have stopped his infusions.  The lesions of his skin fluctuate but overall are improved.   CLL (chronic lymphocytic leukemia) ([place]) We held off on treatment of his CLL due to multiple medical problems.  His skin cancer is now being treated with cemiplimab and he is tolerating this well.  He has worsening anemia, now requiring transfusion.  This is likely due to his CLL, as he received IV iron recently. He started acalabrutinib 100 mg twice daily for his CLL on July 21st. He has had an increase in his white blood cell count since starting acalabrutinib.  An intial increase in the lymphocytosis is common with acalabrutinib.  His white blood cell count has decreased somewhat from 222,000 to 212,000.  He has persistent anemia without definite nutritional deficiency, so this was felt to be due to his CLL.  He then reported black stool.  He was started on pantoprazole 40 mg daily.  He had worsening diarrhea and stool studies revealed enteropathogenic E. Coli, which was treated with ciprofloxacin.   He had persistent melana and stool Hemoccults x3 were positive, so alcalabrutinib was discontinued on September 15th.  His white blood cell count decreased to 109,900 and his hemoglobin improved to 9.2 on  September 21. His WBC came down to 15,000 in November, 2023. Since stopping treatment, his white count is rising again to 49,000 and now 57,000 in January, 2024 and 58,000 in February, so relatively stable now. His ANC has improved and his ALC is stable.   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 He was transfused in August when his hemoglobin dropped to 8 and he was very symptomatic. He last received IV iron replacement in June.  He required transfusion in August when his hemoglobin dropped to 8 and he was symptomatic. His hemoglobin has been better since December. It has decreased to 10.1 in January and now is up to 10.4 today.   Plan:  We will keep him off treatment for his CLL and skin cancer, and monitor regularly.  The lesion on the right side of his cheek has healed but he still has a significant lesion of the nose. Overall his face looks better. He has 1+ edema in both lower extremities and he continues to take his Lasix 49m daily. He is taking and tolerating amiodarone. I will see him back in 1 month with CBC, CMP, and a port flush. The patient and his daughter understand the plans discussed today and are in agreement with them.  They know to contact our office if he develops concerns prior to his next appointment.  I provided 20 minutes of face-to-face time during this this encounter and > 50% was spent counseling as documented under my assessment and plan.   CChinita Pester  Hinton Rao, [facility] [address] [place] [address] Dept: [phone] Dept Fax: [phone]   No orders of the defined types were placed in this encounter.    CHIEF COMPLAINT:  CC:  Squamous cell skin cancers  Current Treatment: Surveillance  HISTORY OF PRESENT ILLNESS:   Oncology History  CLL (chronic lymphocytic leukemia) ([place])  05/25/2015 Initial Diagnosis   CLL (chronic lymphocytic leukemia) ([place])    02/04/2021 Cancer Staging   Staging form: Chronic Lymphocytic Leukemia / Small Lymphocytic Lymphoma, AJCC 8th Edition - Clinical stage from 02/04/2021: Modified Rai Stage III (Modified Rai risk: High, Binet: Stage A, Lymphocytosis: Present, Adenopathy: Absent, Organomegaly: Absent, Anemia: Present, Thrombocytopenia: Absent) - Signed by Derwood Kaplan, MD on 02/08/2021 Stage prefix: Recurrence Absolute lymphocyte count (ALC) (cells/uL): 100 Hemoglobin (Hgb) (g/dL): 11.4 Platelet count (x10E9/L of blood): 184   Cancer of the skin, basal cell  05/06/2021 Initial Diagnosis   Cancer of the skin, basal cell   08/30/2021 - 12/31/2021 Chemotherapy   Patient is on Treatment Plan : [place] q21d     08/30/2021 - 01/21/2022 Chemotherapy   Patient is on Treatment Plan : [place] q21d         INTERVAL HISTORY:  Cage is here today for repeat clinical assessment after stopping acalabrutinib for his CLL and cemiplimab for his skin cancers a few months ago. Patient states that he feels ok and only has complaints of generalized weakness. The lesion on the right side of his cheek has healed but he still has a significant lesion of the nose. Overall his face looks better. He has 1+ edema in both lower extremities and he continues to take his Lasix 2m daily. I advised him and his daughter to speak with his cardiologist about the edema. He is taking and tolerating amiodarone. WBC is 58.7 as compared to 57.7 one month ago, 85% of the cells are lymphocytes. His ALC is at 49,000, platelets are normal, and his hemoglobin has come up from 10.1 to 10.4. His CMP is normal other than his BUN at 28 compared to 25 last month, total protein has come up from 5.5 in December, 2023 to 5.7 currently. I will see him back in 1 month with CBC, CMP, and a port flush.  He denies signs of infection such as sore throat, sinus drainage, cough, or urinary symptoms.  He denies  fevers or recurrent chills. He denies pain. He denies nausea, vomiting, chest pain, dyspnea or cough. His weight has been stable. He is accompanied by his daughter at today's visit as usual.   REVIEW OF SYSTEMS:  Review of Systems  Constitutional:  Positive for fatigue. Negative for appetite change, chills, diaphoresis, fever and unexpected weight change.  HENT:  Negative.  Negative for hearing loss, lump/mass, mouth sores, nosebleeds, sore throat, tinnitus, trouble swallowing and voice change.   Eyes: Negative.  Negative for eye problems and icterus.  Respiratory: Negative.  Negative for chest tightness, cough, hemoptysis, shortness of breath and wheezing.   Cardiovascular: Negative.  Negative for chest pain, leg swelling and palpitations.  Gastrointestinal: Negative.  Negative for abdominal distention, abdominal pain, blood in stool, constipation, diarrhea, nausea, rectal pain and vomiting.  Endocrine: Negative.  Negative for hot flashes.  Genitourinary: Negative.  Negative for bladder incontinence, difficulty urinating, dyspareunia, dysuria, frequency, hematuria, nocturia, pelvic pain and penile discharge.   Musculoskeletal: Negative.  Negative for arthralgias, back pain, flank  pain, gait problem, myalgias, neck pain and neck stiffness.  Skin: Negative.  Negative for itching, rash and wound.  Neurological: Negative.  Negative for dizziness, extremity weakness, gait problem, headaches, light-headedness, numbness, seizures and speech difficulty.       Generalized weakness.  Hematological: Negative.  Negative for adenopathy. Does not bruise/bleed easily.  Psychiatric/Behavioral: Negative.  Negative for confusion, decreased concentration, depression, sleep disturbance and suicidal ideas. The patient is not nervous/anxious.      VITALS:  Blood pressure (!) 101/56, pulse 71, temperature 98.3 F (36.8 C), temperature source Oral, resp. rate 18, height 5' 10"$  (1.778 m), weight 150 lb 4.8 oz (68.2  kg), SpO2 98 %.  Wt Readings from Last 3 Encounters:  06/08/22 150 lb 4.8 oz (68.2 kg)  05/12/22 150 lb 3.2 oz (68.1 kg)  04/14/22 145 lb 12.8 oz (66.1 kg)    Body mass index is 21.57 kg/m.  Performance status (ECOG): 2 - Symptomatic, <50% confined to bed  PHYSICAL EXAM:  Physical Exam Vitals and nursing note reviewed. Exam conducted with a chaperone present.  Constitutional:      General: He is not in acute distress.    Appearance: Normal appearance. He is normal weight. He is not ill-appearing, toxic-appearing or diaphoretic.  HENT:     Head: Normocephalic and atraumatic.     Right Ear: Tympanic membrane, ear canal and external ear normal. There is no impacted cerumen.     Left Ear: Tympanic membrane, ear canal and external ear normal. There is no impacted cerumen.     Nose: Nose normal. No congestion or rhinorrhea.     Mouth/Throat:     Mouth: Mucous membranes are moist.     Pharynx: Oropharynx is clear. No oropharyngeal exudate or posterior oropharyngeal erythema.  Eyes:     General: No scleral icterus.       Right eye: No discharge.        Left eye: No discharge.     Extraocular Movements: Extraocular movements intact.     Conjunctiva/sclera: Conjunctivae normal.     Pupils: Pupils are equal, round, and reactive to light.  Neck:     Vascular: No carotid bruit.  Cardiovascular:     Rate and Rhythm: Normal rate and regular rhythm.     Pulses: Normal pulses.     Heart sounds: Normal heart sounds. No murmur heard.    No friction rub. No gallop.  Pulmonary:     Effort: Pulmonary effort is normal. No respiratory distress.     Breath sounds: Normal breath sounds. No stridor. No wheezing, rhonchi or rales.  Chest:     Chest wall: No tenderness.  Abdominal:     General: Bowel sounds are normal. There is no distension.     Palpations: Abdomen is soft. There is no hepatomegaly, splenomegaly or mass.     Tenderness: There is no abdominal tenderness. There is no right CVA  tenderness, left CVA tenderness, guarding or rebound.     Hernia: No hernia is present.  Musculoskeletal:        General: No swelling, tenderness, deformity or signs of injury. Normal range of motion.     Cervical back: Normal range of motion and neck supple. No rigidity or tenderness.     Right lower leg: 1+ Edema present.     Left lower leg: 1+ Edema present.     Comments: Induration of the skin.   Lymphadenopathy:     Cervical: No cervical adenopathy.     Upper  Body:     Right upper body: No supraclavicular or axillary adenopathy.     Left upper body: No supraclavicular or axillary adenopathy.  Skin:    General: Skin is warm and dry.     Coloration: Skin is not jaundiced or pale.     Findings: Lesion present. No bruising, erythema or rash.     Comments: The lesions of his right face are doing quite well.  There is a single raised lesion of the left cheek.  He has persistent lesions of the nose with overlying eschar.  Neurological:     General: No focal deficit present.     Mental Status: He is alert and oriented to person, place, and time. Mental status is at baseline.     Cranial Nerves: No cranial nerve deficit.     Sensory: No sensory deficit.     Motor: No weakness.     Coordination: Coordination normal.     Gait: Gait normal.     Deep Tendon Reflexes: Reflexes normal.  Psychiatric:        Mood and Affect: Mood normal.        Behavior: Behavior normal.        Thought Content: Thought content normal.        Judgment: Judgment normal.     LABS:      Latest Ref Rng & Units 06/08/2022   12:00 AM 05/12/2022   12:00 AM 04/14/2022    4:20 PM  CBC  WBC  58.7     57.7     49.9      Hemoglobin 13.5 - 17.5 10.4     10.1     10.6      Hematocrit 41 - 53 33     33     33      Platelets 150 - 400 K/uL 207     267     242         This result is from an external source.      Latest Ref Rng & Units 06/08/2022   12:00 AM 05/12/2022   12:00 AM 04/14/2022    3:28 PM  CMP  Glucose  70 - 99 mg/dL   93   BUN 4 - 21 28     23     25   $ Creatinine 0.6 - 1.3 1.0     1.0     1.13   Sodium 137 - 147 138     138     140   Potassium 3.5 - 5.1 mEq/L 4.3     4.5     4.4   Chloride 99 - 108 105     106     108   CO2 13 - 22 26     25     26   $ Calcium 8.7 - 10.7 8.4     8.4     8.7   Total Protein 6.5 - 8.1 g/dL   5.5   Total Bilirubin 0.3 - 1.2 mg/dL   0.5   Alkaline Phos 25 - 125 62     56     52   AST 14 - 40 23     23     19   $ ALT 10 - 40 U/L 20     18     18      $ This result is from an external source.     No results found for: "CEA1", "CEA" /  No results found for: "CEA1", "CEA" No results found for: "PSA1" No results found for: "[id number]" No results found for: "CAN125"  No results found for: "TOTALPROTELP", "ALBUMINELP", "A1GS", "A2GS", "BETS", "BETA2SER", "GAMS", "MSPIKE", "SPEI" Lab Results  Component Value Date   TIBC 267 12/31/2021   TIBC 291 09/15/2021   TIBC 341 06/04/2021   FERRITIN 248 12/31/2021   FERRITIN 48 09/15/2021   FERRITIN 53 06/04/2021   IRONPCTSAT 22 12/31/2021   IRONPCTSAT 13 (L) 09/15/2021   IRONPCTSAT 11 (L) 06/04/2021   Lab Results  Component Value Date   LDH 166 08/03/2020    STUDIES:  No results found.    HISTORY:   Past Medical History:  Diagnosis Date   B12 deficiency    Cancer of the skin, 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   CHF (congestive heart failure) ([facility])    Diabetes mellitus without complication ([place])    History of prostate cancer    Hyperlipidemia    Hypertension    Hypoglycemia 05/06/2021   Iron deficiency anemia 02/20/2020   Leucocytosis    Leukemia, lymphocytic, chronic ([facility])     Past Surgical History:  Procedure Laterality Date   PROSTATE BIOPSY      Family History  Problem Relation Age of Onset   Ovarian cancer Mother 83   Cancer Sister 39    Social History:  reports that he has quit smoking. He has never used smokeless tobacco. He reports that he does not currently use alcohol. He reports that he does not  use drugs.The patient is accompanied by his daughter today.  Allergies: No Known Allergies  Current Medications: Current Outpatient Medications  Medication Sig Dispense Refill   acetaminophen (TYLENOL) 500 MG tablet Take 1,000 mg by mouth every 6 (six) hours as needed for mild pain, moderate pain or fever.     amiodarone (PACERONE) 200 MG tablet Take 200 mg by mouth daily.     apixaban (ELIQUIS) 5 MG TABS tablet Take 1 tablet by mouth 2 (two) times daily.     Ascorbic Acid (VITAMIN C) 500 MG CAPS Take 500 mg by mouth daily.     bismuth subsalicylate (PEPTO BISMOL) 262 MG/15ML suspension Take 30 mLs by mouth every 6 (six) hours as needed. For constipation     cholecalciferol (VITAMIN D3) 25 MCG (1000 UNIT) tablet Take 1,000 Units by mouth daily.     furosemide (LASIX) 40 MG tablet Take 0.5 tablets (20 mg total) by mouth daily. 30 tablet 0   ketoconazole (NIZORAL) 2 % shampoo Apply topically 3 (three) times a week.     levothyroxine (SYNTHROID) 75 MCG tablet Take 75 mcg by mouth daily before breakfast.     metFORMIN (GLUCOPHAGE-XR) 500 MG 24 hr tablet Take 500 mg by mouth 2 (two) times daily.     midodrine (PROAMATINE) 5 MG tablet Take 1 tablet (5 mg total) by mouth 3 (three) times daily with meals.     Omega-3 Fatty Acids (FISH OIL) 1000 MG CAPS Take 1,000 mg by mouth daily.     ondansetron (ZOFRAN) 4 MG tablet Take 1 tablet (4 mg total) by mouth every 6 (six) hours as needed for nausea. 20 tablet 0   pantoprazole (PROTONIX) 40 MG tablet TAKE 1 TABLET BY MOUTH EVERY DAY 90 tablet 1   polyethylene glycol (MIRALAX / GLYCOLAX) 17 g packet Take 17 g by mouth daily as needed for mild constipation. 14 each 0   rOPINIRole (REQUIP) 0.5 MG tablet Take 0.5-1 mg by mouth See admin instructions. Taking  one tablet at bedtime, if no relief take an additional tablet     rosuvastatin (CRESTOR) 20 MG tablet Take 10 mg by mouth daily.     vitamin B-12 (CYANOCOBALAMIN) 500 MCG tablet Take 500 mcg by mouth daily.      Current Facility-Administered Medications  Medication Dose Route Frequency Provider Last Rate Last Admin   sodium chloride flush (NS) 0.9 % injection 10 mL  10 mL Intracatheter PRN Mosher, Vida Roller A, PA-C   10 mL at 06/08/22 1545     I,Jasmine M Lassiter,acting as a scribe for Derwood Kaplan, MD.,have documented all relevant documentation on the behalf of Derwood Kaplan, MD,as directed by  Derwood Kaplan, MD while in the presence of Derwood Kaplan, MD.

## 2022-06-08 ENCOUNTER — Inpatient Hospital Stay: Payer: Medicare Other | Attending: Oncology

## 2022-06-08 ENCOUNTER — Telehealth: Payer: Self-pay | Admitting: Oncology

## 2022-06-08 ENCOUNTER — Encounter: Payer: Self-pay | Admitting: Oncology

## 2022-06-08 ENCOUNTER — Other Ambulatory Visit: Payer: Self-pay | Admitting: Oncology

## 2022-06-08 ENCOUNTER — Inpatient Hospital Stay (INDEPENDENT_AMBULATORY_CARE_PROVIDER_SITE_OTHER): Payer: Medicare Other | Admitting: Oncology

## 2022-06-08 VITALS — BP 101/56 | HR 71 | Temp 98.3°F | Resp 18 | Ht 70.0 in | Wt 150.3 lb

## 2022-06-08 DIAGNOSIS — C44310 Basal cell carcinoma of skin of unspecified parts of face: Secondary | ICD-10-CM | POA: Diagnosis not present

## 2022-06-08 DIAGNOSIS — C4491 Basal cell carcinoma of skin, unspecified: Secondary | ICD-10-CM | POA: Diagnosis present

## 2022-06-08 DIAGNOSIS — Z452 Encounter for adjustment and management of vascular access device: Secondary | ICD-10-CM | POA: Diagnosis present

## 2022-06-08 DIAGNOSIS — C4440 Unspecified malignant neoplasm of skin of scalp and neck: Secondary | ICD-10-CM

## 2022-06-08 DIAGNOSIS — C9110 Chronic lymphocytic leukemia of B-cell type not having achieved remission: Secondary | ICD-10-CM | POA: Insufficient documentation

## 2022-06-08 DIAGNOSIS — D509 Iron deficiency anemia, unspecified: Secondary | ICD-10-CM

## 2022-06-08 LAB — CBC AND DIFFERENTIAL
HCT: 33 — AB (ref 41–53)
Hemoglobin: 10.4 — AB (ref 13.5–17.5)
Neutrophils Absolute: 9.39
Platelets: 207 10*3/uL (ref 150–400)
WBC: 58.7

## 2022-06-08 LAB — HEPATIC FUNCTION PANEL
ALT: 20 U/L (ref 10–40)
AST: 23 (ref 14–40)
Alkaline Phosphatase: 62 (ref 25–125)
Bilirubin, Total: 0.2

## 2022-06-08 LAB — BASIC METABOLIC PANEL
BUN: 28 — AB (ref 4–21)
CO2: 26 — AB (ref 13–22)
Chloride: 105 (ref 99–108)
Creatinine: 1 (ref 0.6–1.3)
Glucose: 101
Potassium: 4.3 mEq/L (ref 3.5–5.1)
Sodium: 138 (ref 137–147)

## 2022-06-08 LAB — CBC: RBC: 3.68 — AB (ref 3.87–5.11)

## 2022-06-08 LAB — COMPREHENSIVE METABOLIC PANEL
Albumin: 3.4 — AB (ref 3.5–5.0)
Calcium: 8.4 — AB (ref 8.7–10.7)

## 2022-06-08 MED ORDER — HEPARIN SOD (PORK) LOCK FLUSH 100 UNIT/ML IV SOLN
500.0000 [IU] | Freq: Once | INTRAVENOUS | Status: AC | PRN
  Administered 2022-06-08: 500 [IU]

## 2022-06-08 MED ORDER — SODIUM CHLORIDE 0.9% FLUSH
10.0000 mL | INTRAVENOUS | Status: DC | PRN
  Administered 2022-06-08: 10 mL

## 2022-06-08 NOTE — Progress Notes (Signed)
CRITICAL VALUE STICKER  CRITICAL VALUE:  WBC 58.7  RECEIVER (on-site recipient of call):  Georgette Shell RN  [place] NOTIFIED:    06/08/2022 @ 1602  MESSENGER (representative from lab):  [facility]  MD NOTIFIED:  Dr. Hinton Rao   TIME OF NOTIFICATION:   1605  RESPONSE:  Dr. Hinton Rao scheduled to see patient.

## 2022-06-08 NOTE — Telephone Encounter (Signed)
06/08/22 Next appt scheduled and confirmed with patient

## 2022-06-19 ENCOUNTER — Encounter: Payer: Self-pay | Admitting: Hematology and Oncology

## 2022-07-07 ENCOUNTER — Inpatient Hospital Stay: Payer: Medicare Other

## 2022-07-07 ENCOUNTER — Other Ambulatory Visit: Payer: Self-pay | Admitting: Oncology

## 2022-07-07 ENCOUNTER — Inpatient Hospital Stay: Payer: Medicare Other | Attending: Oncology | Admitting: Oncology

## 2022-07-07 VITALS — BP 98/58 | HR 65 | Temp 97.7°F | Resp 16 | Ht 70.0 in | Wt 151.8 lb

## 2022-07-07 DIAGNOSIS — D500 Iron deficiency anemia secondary to blood loss (chronic): Secondary | ICD-10-CM | POA: Diagnosis not present

## 2022-07-07 DIAGNOSIS — Z79620 Long term (current) use of immunosuppressive biologic: Secondary | ICD-10-CM | POA: Diagnosis not present

## 2022-07-07 DIAGNOSIS — C9110 Chronic lymphocytic leukemia of B-cell type not having achieved remission: Secondary | ICD-10-CM | POA: Insufficient documentation

## 2022-07-07 DIAGNOSIS — Z452 Encounter for adjustment and management of vascular access device: Secondary | ICD-10-CM | POA: Diagnosis not present

## 2022-07-07 DIAGNOSIS — D509 Iron deficiency anemia, unspecified: Secondary | ICD-10-CM | POA: Diagnosis not present

## 2022-07-07 DIAGNOSIS — C44310 Basal cell carcinoma of skin of unspecified parts of face: Secondary | ICD-10-CM

## 2022-07-07 DIAGNOSIS — C4491 Basal cell carcinoma of skin, unspecified: Secondary | ICD-10-CM

## 2022-07-07 DIAGNOSIS — E039 Hypothyroidism, unspecified: Secondary | ICD-10-CM

## 2022-07-07 DIAGNOSIS — Z5112 Encounter for antineoplastic immunotherapy: Secondary | ICD-10-CM | POA: Diagnosis present

## 2022-07-07 LAB — CBC AND DIFFERENTIAL
HCT: 32 — AB (ref 41–53)
Hemoglobin: 10 — AB (ref 13.5–17.5)
Neutrophils Absolute: 9.53
Platelets: 207 10*3/uL (ref 150–400)
WBC: 68.1

## 2022-07-07 LAB — HEPATIC FUNCTION PANEL
ALT: 23 U/L (ref 10–40)
AST: 28 (ref 14–40)
Alkaline Phosphatase: 54 (ref 25–125)
Bilirubin, Total: 0.2

## 2022-07-07 LAB — BASIC METABOLIC PANEL
BUN: 25 — AB (ref 4–21)
CO2: 26 — AB (ref 13–22)
Chloride: 106 (ref 99–108)
Creatinine: 1 (ref 0.6–1.3)
Glucose: 154
Potassium: 4.4 mEq/L (ref 3.5–5.1)
Sodium: 138 (ref 137–147)

## 2022-07-07 LAB — COMPREHENSIVE METABOLIC PANEL
Albumin: 3.2 — AB (ref 3.5–5.0)
Calcium: 8.4 — AB (ref 8.7–10.7)

## 2022-07-07 LAB — CBC: RBC: 3.51 — AB (ref 3.87–5.11)

## 2022-07-07 MED ORDER — HEPARIN SOD (PORK) LOCK FLUSH 100 UNIT/ML IV SOLN
500.0000 [IU] | Freq: Once | INTRAVENOUS | Status: AC | PRN
  Administered 2022-07-07: 500 [IU]

## 2022-07-07 MED ORDER — SODIUM CHLORIDE 0.9% FLUSH
10.0000 mL | INTRAVENOUS | Status: DC | PRN
  Administered 2022-07-07: 10 mL

## 2022-07-07 NOTE — Progress Notes (Signed)
ON PATHWAY REGIMEN - Melanoma and Other Skin Cancers  No Change  Continue With Treatment as Ordered.  Original Decision Date/Time: 08/19/2021 18:05     A cycle is every 21 days:     Cemiplimab-rwlc   **Always confirm dose/schedule in your pharmacy ordering system**  Patient Characteristics: Cutaneous Squamous Cell Carcinoma, Locally Advanced - Unresectable, Systemic Therapy Indicated Disease Classification: Cutaneous Squamous Cell Carcinoma Therapeutic Status: Locally Advanced - Unresectable Click here if multiple primary tumors are present.: false Intent of Therapy: Non-Curative / Palliative Intent, Discussed with Patient

## 2022-07-07 NOTE — Progress Notes (Signed)
CRITICAL VALUE STICKER  CRITICAL VALUE:  WBC 68.1  RECEIVER (on-site recipient of call):  Georgette Shell RN  DATE & TIME NOTIFIED: 07/07/2022 @ 1600  MESSENGER (representative from lab):  [facility]  MD NOTIFIED:  Dr. Hinton Rao  TIME OF NOTIFICATION: [id number]  RESPONSE:  Scheduled to see patient.

## 2022-07-07 NOTE — Progress Notes (Signed)
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  He received 8 cycles with improvement in his skin lesions.  Unfortunately, he has had progressive worsening fatigue and debility and so we have stopped his infusions.  The lesions of his skin fluctuate but overall are ia lttle worse.  We will resume the cemiplimab next week.   CLL (chronic lymphocytic leukemia) ([place]) We held off on treatment of his CLL due to multiple medical problems.  His skin cancer is now being treated with cemiplimab and he is tolerating this well.  He has worsening anemia, now requiring transfusion.  This is likely due to his CLL, as he received IV iron recently. He started acalabrutinib 100 mg twice daily for his CLL on July 21st. He has had an increase in his white blood cell count since starting acalabrutinib.  An intial increase in the lymphocytosis is common with acalabrutinib.  His white blood cell count has decreased somewhat from 222,000 to 212,000.  He has persistent anemia without definite nutritional deficiency, so this was felt to be due to his CLL.  He then reported black stool.  He was started on pantoprazole 40 mg daily.  He had worsening diarrhea and stool studies revealed enteropathogenic E. Coli, which was treated with ciprofloxacin.   He had persistent melana and stool Hemoccults x3 were positive, so alcalabrutinib was discontinued on September 15th.  His white blood cell count decreased to  109,900 and his hemoglobin improved to 9.2 on September 21. His WBC came down to 15,000 in November, 2023. Since stopping treatment, his white count is rising again to 49,000 and now 57,000 in January, 2024 and 58,000 in February, 68,000 in March, and so slowly progressing. His ANC has improved and his ALC is stable.   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 He was transfused in August when his hemoglobin dropped to 8 and he was very symptomatic. He last received IV iron replacement in June.  He required transfusion in August when his hemoglobin dropped to 8 and he was symptomatic. His hemoglobin has been better since December. It was 10.4 in February and now 10.0 today.  Hypotension He does complain of feeling weak and his blood pressure is only 98/58.  He is in a wheelchair today as usual.   Plan:  We will keep him off treatment for his CLL, and monitor his blood counts regularly.  The lesion on the right side of his cheek has healed but he still has a severe lesion of the nose.  After discussion with the patient and his daughter, we will resume cemiplimab next week and monitor his skin.  He has 1+ edema in both lower extremities and he continues to take his Lasix 20mg  daily and amiodarone. I will see him back in 1 month with CBC, CMP, and a port flush. The patient and his daughter understand the plans discussed today and are in agreement with them.  They  know to contact our office if he develops concerns prior to his next appointment.  I provided 20 minutes of face-to-face time during this this encounter and > 50% was spent counseling as documented under my assessment and plan.   Derwood Kaplan, MD  [facility] [address] [place] [address] Dept: [phone] Dept Fax: [phone]   No orders of the defined types were placed in this encounter.    CHIEF COMPLAINT:  CC:  Squamous cell  skin cancers  Current Treatment: Surveillance  HISTORY OF PRESENT ILLNESS:   Oncology History  CLL (chronic lymphocytic leukemia)  05/25/2015 Initial Diagnosis   CLL (chronic lymphocytic leukemia) ([place])   02/04/2021 Cancer Staging   Staging form: Chronic Lymphocytic Leukemia / Small Lymphocytic Lymphoma, AJCC 8th Edition - Clinical stage from 02/04/2021: Modified Rai Stage III (Modified Rai risk: High, Binet: Stage A, Lymphocytosis: Present, Adenopathy: Absent, Organomegaly: Absent, Anemia: Present, Thrombocytopenia: Absent) - Signed by Derwood Kaplan, MD on 02/08/2021 Stage prefix: Recurrence Absolute lymphocyte count (ALC) (cells/uL): 100 Hemoglobin (Hgb) (g/dL): 11.4 Platelet count (x10E9/L of blood): 184   Cancer of the skin, basal cell  05/06/2021 Initial Diagnosis   Cancer of the skin, basal cell   08/30/2021 - 12/31/2021 Chemotherapy   Patient is on Treatment Plan : [place] q21d     08/30/2021 - 01/21/2022 Chemotherapy   Patient is on Treatment Plan : [place] q21d     07/15/2022 -  Chemotherapy   Patient is on Treatment Plan : ADVANCED CUTANEOUS SQUAMOUS CELL CARCINOMA Cemiplimab q21d     BCC (basal cell carcinoma), face  12/28/2016 Initial Diagnosis   BCC (basal cell carcinoma), face   07/15/2022 -  Chemotherapy   Patient is on Treatment Plan : ADVANCED CUTANEOUS SQUAMOUS CELL CARCINOMA Cemiplimab q21d         INTERVAL HISTORY:  Fernando Young is here today for repeat clinical assessment after stopping acalabrutinib for his CLL a few months ago. Patient states that he feels ok and only has complaints of generalized weakness. The lesion on the right side of his cheek has healed but he still has a severe lesion of the nose.  He has 1+ edema in both lower extremities and he continues to take his Lasix 20mg  daily. I advised him and his daughter to speak with his cardiologist about the edema. He is taking and tolerating  amiodarone. WBC iwas 58.7 one month ago, and now is 68.7 with 85% of the cells as lymphocytes. His ALC is at 53,000, platelets are normal, and his hemoglobin is stable at 10.0 his CMP is normal other than his BUN at 28 compared to 25 last month, total protein remains low at 5.4 and I have encouraged protein in his diet.  I will see him back in 1 month with CBC, CMP, and a port flush.  He denies signs of infection such as sore throat, sinus drainage, cough, or urinary symptoms.  He denies fevers or recurrent chills. He denies pain. He denies nausea, vomiting, chest pain, dyspnea or cough. His weight has been stable. He is accompanied by his daughter at today's visit as usual.   REVIEW OF SYSTEMS:  Review of Systems  Constitutional:  Positive for fatigue. Negative for appetite change, chills, diaphoresis, fever and unexpected weight change.  HENT:  Negative.  Negative for hearing loss, lump/mass, mouth sores, nosebleeds, sore throat, tinnitus, trouble swallowing and voice change.  Eyes: Negative.  Negative for eye problems and icterus.  Respiratory: Negative.  Negative for chest tightness, cough, hemoptysis, shortness of breath and wheezing.   Cardiovascular: Negative.  Negative for chest pain, leg swelling and palpitations.  Gastrointestinal: Negative.  Negative for abdominal distention, abdominal pain, blood in stool, constipation, diarrhea, nausea, rectal pain and vomiting.  Endocrine: Negative.  Negative for hot flashes.  Genitourinary: Negative.  Negative for bladder incontinence, difficulty urinating, dyspareunia, dysuria, frequency, hematuria, nocturia, pelvic pain and penile discharge.   Musculoskeletal: Negative.  Negative for arthralgias, back pain, flank pain, gait problem, myalgias, neck pain and neck stiffness.  Skin: Negative.  Negative for itching, rash and wound.  Neurological: Negative.  Negative for dizziness, extremity weakness, gait problem, headaches, light-headedness, numbness,  seizures and speech difficulty.       Generalized weakness.  Hematological: Negative.  Negative for adenopathy. Does not bruise/bleed easily.  Psychiatric/Behavioral: Negative.  Negative for confusion, decreased concentration, depression, sleep disturbance and suicidal ideas. The patient is not nervous/anxious.      VITALS:  Blood pressure (!) 98/58, pulse 65, temperature 97.7 F (36.5 C), temperature source Oral, resp. rate 16, height 5\' 10"  (1.778 m), weight 151 lb 12.8 oz (68.9 kg), SpO2 100 %.  Wt Readings from Last 3 Encounters:  08/02/22 154 lb 12.8 oz (70.2 kg)  07/15/22 162 lb (73.5 kg)  07/07/22 151 lb 12.8 oz (68.9 kg)    Body mass index is 21.78 kg/m.  Performance status (ECOG): 2 - Symptomatic, <50% confined to bed  PHYSICAL EXAM:  Physical Exam Vitals and nursing note reviewed. Exam conducted with a chaperone present.  Constitutional:      General: He is not in acute distress.    Appearance: Normal appearance. He is normal weight. He is not ill-appearing, toxic-appearing or diaphoretic.  HENT:     Head: Normocephalic and atraumatic.     Right Ear: Tympanic membrane, ear canal and external ear normal. There is no impacted cerumen.     Left Ear: Tympanic membrane, ear canal and external ear normal. There is no impacted cerumen.     Nose: Nose normal. No congestion or rhinorrhea.     Mouth/Throat:     Mouth: Mucous membranes are moist.     Pharynx: Oropharynx is clear. No oropharyngeal exudate or posterior oropharyngeal erythema.  Eyes:     General: No scleral icterus.       Right eye: No discharge.        Left eye: No discharge.     Extraocular Movements: Extraocular movements intact.     Conjunctiva/sclera: Conjunctivae normal.     Pupils: Pupils are equal, round, and reactive to light.  Neck:     Vascular: No carotid bruit.  Cardiovascular:     Rate and Rhythm: Normal rate and regular rhythm.     Pulses: Normal pulses.     Heart sounds: Normal heart sounds.  No murmur heard.    No friction rub. No gallop.  Pulmonary:     Effort: Pulmonary effort is normal. No respiratory distress.     Breath sounds: Normal breath sounds. No stridor. No wheezing, rhonchi or rales.  Chest:     Chest wall: No tenderness.  Abdominal:     General: Bowel sounds are normal. There is no distension.     Palpations: Abdomen is soft. There is no hepatomegaly, splenomegaly or mass.     Tenderness: There is no abdominal tenderness. There is no right CVA tenderness, left CVA tenderness, guarding or  rebound.     Hernia: No hernia is present.  Musculoskeletal:        General: No swelling, tenderness, deformity or signs of injury. Normal range of motion.     Cervical back: Normal range of motion and neck supple. No rigidity or tenderness.     Right lower leg: 1+ Edema present.     Left lower leg: 1+ Edema present.     Comments: Induration of the skin.   Lymphadenopathy:     Cervical: No cervical adenopathy.     Upper Body:     Right upper body: No supraclavicular or axillary adenopathy.     Left upper body: No supraclavicular or axillary adenopathy.  Skin:    General: Skin is warm and dry.     Coloration: Skin is not jaundiced or pale.     Findings: Lesion present. No bruising, erythema or rash.     Comments: The lesions of his right face are doing quite well.  There is a single raised lesion of the left cheek.  He has persistent lesions of the nose with overlying eschar.  Neurological:     General: No focal deficit present.     Mental Status: He is alert and oriented to person, place, and time. Mental status is at baseline.     Cranial Nerves: No cranial nerve deficit.     Sensory: No sensory deficit.     Motor: No weakness.     Coordination: Coordination normal.     Gait: Gait normal.     Deep Tendon Reflexes: Reflexes normal.  Psychiatric:        Mood and Affect: Mood normal.        Behavior: Behavior normal.        Thought Content: Thought content normal.         Judgment: Judgment normal.     LABS:      Latest Ref Rng & Units 07/07/2022   12:00 AM 06/08/2022   12:00 AM 05/12/2022   12:00 AM  CBC  WBC  68.1     58.7     57.7      Hemoglobin 13.5 - 17.5 10.0     10.4     10.1      Hematocrit 41 - 53 32     33     33      Platelets 150 - 400 K/uL 207     207     267         This result is from an external source.      Latest Ref Rng & Units 07/07/2022   12:00 AM 06/08/2022   12:00 AM 05/12/2022   12:00 AM  CMP  BUN 4 - 21 25     28     23       Creatinine 0.6 - 1.3 1.0     1.0     1.0      Sodium 137 - 147 138     138     138      Potassium 3.5 - 5.1 mEq/L 4.4     4.3     4.5      Chloride 99 - 108 106     105     106      CO2 13 - 22 26     26     25       Calcium 8.7 - 10.7 8.4     8.4  8.4      Alkaline Phos 25 - 125 54     62     56      AST 14 - 40 28     23     23       ALT 10 - 40 U/L 23     20     18          This result is from an external source.     No results found for: "CEA1", "CEA" / No results found for: "CEA1", "CEA" No results found for: "PSA1" No results found for: "[id number]" No results found for: "CAN125"  No results found for: "TOTALPROTELP", "ALBUMINELP", "A1GS", "A2GS", "BETS", "BETA2SER", "GAMS", "MSPIKE", "SPEI" Lab Results  Component Value Date   TIBC 267 12/31/2021   TIBC 291 09/15/2021   TIBC 341 06/04/2021   FERRITIN 248 12/31/2021   FERRITIN 48 09/15/2021   FERRITIN 53 06/04/2021   IRONPCTSAT 22 12/31/2021   IRONPCTSAT 13 (L) 09/15/2021   IRONPCTSAT 11 (L) 06/04/2021   Lab Results  Component Value Date   LDH 166 08/03/2020    STUDIES:  No results found.    HISTORY:   Past Medical History:  Diagnosis Date   B12 deficiency    Cancer of the skin, 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   CHF (congestive heart failure)    Diabetes mellitus without complication    History of prostate cancer    Hyperlipidemia    Hypertension    Hypoglycemia 05/06/2021   Iron deficiency anemia 02/20/2020   Leucocytosis     Leukemia, lymphocytic, chronic     Past Surgical History:  Procedure Laterality Date   PROSTATE BIOPSY      Family History  Problem Relation Age of Onset   Ovarian cancer Mother 62   Cancer Sister 70    Social History:  reports that he has quit smoking. He has never used smokeless tobacco. He reports that he does not currently use alcohol. He reports that he does not use drugs.The patient is accompanied by his daughter today.  Allergies: No Known Allergies  Current Medications: Current Outpatient Medications  Medication Sig Dispense Refill   acetaminophen (TYLENOL) 500 MG tablet Take 1,000 mg by mouth every 6 (six) hours as needed for mild pain, moderate pain or fever.     amiodarone (PACERONE) 200 MG tablet Take 200 mg by mouth daily.     apixaban (ELIQUIS) 5 MG TABS tablet Take 1 tablet by mouth 2 (two) times daily.     Ascorbic Acid (VITAMIN C) 500 MG CAPS Take 500 mg by mouth daily.     bismuth subsalicylate (PEPTO BISMOL) 262 MG/15ML suspension Take 30 mLs by mouth every 6 (six) hours as needed. For constipation     cholecalciferol (VITAMIN D3) 25 MCG (1000 UNIT) tablet Take 1,000 Units by mouth daily.     furosemide (LASIX) 40 MG tablet Take 0.5 tablets (20 mg total) by mouth daily. 30 tablet 0   ketoconazole (NIZORAL) 2 % shampoo Apply topically 3 (three) times a week.     levothyroxine (SYNTHROID) 75 MCG tablet Take 75 mcg by mouth daily before breakfast.     metFORMIN (GLUCOPHAGE-XR) 500 MG 24 hr tablet Take 500 mg by mouth 2 (two) times daily.     Omega-3 Fatty Acids (FISH OIL) 1000 MG CAPS Take 1,000 mg by mouth daily.     ondansetron (ZOFRAN) 4 MG tablet Take 1 tablet (4 mg total) by mouth every 6 (six) hours as  needed for nausea. 20 tablet 0   pantoprazole (PROTONIX) 40 MG tablet TAKE 1 TABLET BY MOUTH EVERY DAY 90 tablet 1   polyethylene glycol (MIRALAX / GLYCOLAX) 17 g packet Take 17 g by mouth daily as needed for mild constipation. 14 each 0   rOPINIRole (REQUIP)  0.5 MG tablet Take 0.5-1 mg by mouth See admin instructions. Taking one tablet at bedtime, if no relief take an additional tablet     rosuvastatin (CRESTOR) 20 MG tablet Take 10 mg by mouth daily.     vitamin B-12 (CYANOCOBALAMIN) 500 MCG tablet Take 500 mcg by mouth daily.     midodrine (PROAMATINE) 5 MG tablet Take 1 tablet (5 mg total) by mouth 3 (three) times daily with meals. 90 tablet 3   ofloxacin (OCUFLOX) 0.3 % ophthalmic solution 1 drop 4 (four) times daily.     Current Facility-Administered Medications  Medication Dose Route Frequency Provider Last Rate Last Admin   sodium chloride flush (NS) 0.9 % injection 10 mL  10 mL Intracatheter PRN Mosher, Kelli A, PA-C   10 mL at 07/07/22 1545   Facility-Administered Medications Ordered in Other Visits  Medication Dose Route Frequency Provider Last Rate Last Admin   sodium chloride flush (NS) 0.9 % injection 10 mL  10 mL Intracatheter PRN Rosanne Sack A, PA-C   10 mL at 08/02/22 1532     I,Jasmine M Lassiter,acting as a scribe for Derwood Kaplan, MD.,have documented all relevant documentation on the behalf of Derwood Kaplan, MD,as directed by  Derwood Kaplan, MD while in the presence of Derwood Kaplan, MD.

## 2022-07-08 ENCOUNTER — Other Ambulatory Visit: Payer: Self-pay

## 2022-07-08 ENCOUNTER — Telehealth: Payer: Self-pay

## 2022-07-08 DIAGNOSIS — E039 Hypothyroidism, unspecified: Secondary | ICD-10-CM

## 2022-07-08 DIAGNOSIS — Z5112 Encounter for antineoplastic immunotherapy: Secondary | ICD-10-CM | POA: Diagnosis not present

## 2022-07-08 LAB — TSH: TSH: 1.748 u[IU]/mL (ref 0.350–4.500)

## 2022-07-08 NOTE — Telephone Encounter (Signed)
Attempted to return daughter call regarding question about infusion. No answer and no VM.

## 2022-07-10 LAB — T4: T4, Total: 8 ug/dL (ref 4.5–12.0)

## 2022-07-15 ENCOUNTER — Inpatient Hospital Stay: Payer: Medicare Other

## 2022-07-15 VITALS — BP 97/59 | HR 69 | Temp 98.1°F | Resp 18 | Ht 70.0 in | Wt 162.0 lb

## 2022-07-15 DIAGNOSIS — C4491 Basal cell carcinoma of skin, unspecified: Secondary | ICD-10-CM

## 2022-07-15 DIAGNOSIS — Z5112 Encounter for antineoplastic immunotherapy: Secondary | ICD-10-CM | POA: Diagnosis not present

## 2022-07-15 DIAGNOSIS — C44310 Basal cell carcinoma of skin of unspecified parts of face: Secondary | ICD-10-CM

## 2022-07-15 MED ORDER — SODIUM CHLORIDE 0.9 % IV SOLN
350.0000 mg | Freq: Once | INTRAVENOUS | Status: AC
  Administered 2022-07-15: 350 mg via INTRAVENOUS
  Filled 2022-07-15: qty 7

## 2022-07-15 MED ORDER — SODIUM CHLORIDE 0.9% FLUSH
10.0000 mL | INTRAVENOUS | Status: DC | PRN
  Administered 2022-07-15: 10 mL

## 2022-07-15 MED ORDER — SODIUM CHLORIDE 0.9 % IV SOLN: Freq: Once | INTRAVENOUS | Status: AC

## 2022-07-15 MED ORDER — HEPARIN SOD (PORK) LOCK FLUSH 100 UNIT/ML IV SOLN
500.0000 [IU] | Freq: Once | INTRAVENOUS | Status: AC | PRN
  Administered 2022-07-15: 500 [IU]

## 2022-07-15 NOTE — Patient Instructions (Signed)
[facility]  Discharge Instructions: Thank you for choosing [facility] to provide your oncology and hematology care.  If you have a lab appointment with the [facility], please go directly to the [facility] and check in at the registration area.   Wear comfortable clothing and clothing appropriate for easy access to any Portacath or PICC line.   We strive to give you quality time with your provider. You may need to reschedule your appointment if you arrive late (15 or more minutes).  Arriving late affects you and other patients whose appointments are after yours.  Also, if you miss three or more appointments without notifying the office, you may be dismissed from the clinic at the provider's discretion.      For prescription refill requests, have your pharmacy contact our office and allow 72 hours for refills to be completed.    Today you received the following chemotherapy and/or immunotherapy agents libtayo   To help prevent nausea and vomiting after your treatment, we encourage you to take your nausea medication as directed.  BELOW ARE SYMPTOMS THAT SHOULD BE REPORTED IMMEDIATELY: *FEVER GREATER THAN 100.4 F (38 C) OR HIGHER *CHILLS OR SWEATING *NAUSEA AND VOMITING THAT IS NOT CONTROLLED WITH YOUR NAUSEA MEDICATION *UNUSUAL SHORTNESS OF BREATH *UNUSUAL BRUISING OR BLEEDING *URINARY PROBLEMS (pain or burning when urinating, or frequent urination) *BOWEL PROBLEMS (unusual diarrhea, constipation, pain near the anus) TENDERNESS IN MOUTH AND THROAT WITH OR WITHOUT PRESENCE OF ULCERS (sore throat, sores in mouth, or a toothache) UNUSUAL RASH, SWELLING OR PAIN  UNUSUAL VAGINAL DISCHARGE OR ITCHING   Items with * indicate a potential emergency and should be followed up as soon as possible or go to the Emergency Department if any problems should occur.  Please show the CHEMOTHERAPY ALERT CARD or IMMUNOTHERAPY ALERT CARD at check-in to the Emergency  Department and triage nurse.  Should you have questions after your visit or need to cancel or reschedule your appointment, please contact [facility]  Dept: [phone]  and follow the prompts.  Office hours are 8:00 a.m. to 4:30 p.m. Monday - Friday. Please note that voicemails left after 4:00 p.m. may not be returned until the following business day.  We are closed weekends and major holidays. You have access to a nurse at all times for urgent questions. Please call the main number to the clinic Dept: [phone] and follow the prompts.  For any non-urgent questions, you may also contact your provider using MyChart. We now offer e-Visits for anyone 31 and older to request care online for non-urgent symptoms. For details visit [email or web address].   Also download the MyChart app! Go to the app store, search "MyChart", open the app, select [facility], and log in with your MyChart username and password.

## 2022-07-18 ENCOUNTER — Encounter: Payer: Self-pay | Admitting: Oncology

## 2022-07-18 ENCOUNTER — Telehealth: Payer: Self-pay

## 2022-07-18 NOTE — Telephone Encounter (Signed)
I spoke with pt to see how he was doing since the Libtayo infusion last Friday. Pt replied, "Well don't nothing hurt. I'm just tired". Pt denies N/V, SOB, chest pain, chills and fever. He remains able to complete his ADL's independently. I encouraged pt to drink at least 2L of fluid each day. His urine should be pretty clear in color. 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 Pt reminded to call us if he develops temp of 100.4 or higher, day or night. He verbalized understanding.

## 2022-08-02 ENCOUNTER — Encounter: Payer: Self-pay | Admitting: Oncology

## 2022-08-02 ENCOUNTER — Other Ambulatory Visit: Payer: Self-pay

## 2022-08-02 ENCOUNTER — Encounter: Payer: Self-pay | Admitting: Hematology and Oncology

## 2022-08-02 ENCOUNTER — Other Ambulatory Visit: Payer: Self-pay | Admitting: Oncology

## 2022-08-02 ENCOUNTER — Inpatient Hospital Stay: Payer: Medicare Other | Attending: Oncology | Admitting: Oncology

## 2022-08-02 ENCOUNTER — Inpatient Hospital Stay: Payer: Medicare Other

## 2022-08-02 VITALS — BP 95/57 | HR 64 | Temp 98.2°F | Resp 18 | Ht 70.0 in | Wt 154.8 lb

## 2022-08-02 DIAGNOSIS — C9110 Chronic lymphocytic leukemia of B-cell type not having achieved remission: Secondary | ICD-10-CM | POA: Diagnosis not present

## 2022-08-02 DIAGNOSIS — C44310 Basal cell carcinoma of skin of unspecified parts of face: Secondary | ICD-10-CM

## 2022-08-02 DIAGNOSIS — C4491 Basal cell carcinoma of skin, unspecified: Secondary | ICD-10-CM | POA: Insufficient documentation

## 2022-08-02 DIAGNOSIS — C4440 Unspecified malignant neoplasm of skin of scalp and neck: Secondary | ICD-10-CM

## 2022-08-02 DIAGNOSIS — Z452 Encounter for adjustment and management of vascular access device: Secondary | ICD-10-CM | POA: Insufficient documentation

## 2022-08-02 DIAGNOSIS — D509 Iron deficiency anemia, unspecified: Secondary | ICD-10-CM

## 2022-08-02 DIAGNOSIS — Z5112 Encounter for antineoplastic immunotherapy: Secondary | ICD-10-CM | POA: Diagnosis not present

## 2022-08-02 DIAGNOSIS — Z79620 Long term (current) use of immunosuppressive biologic: Secondary | ICD-10-CM | POA: Diagnosis not present

## 2022-08-02 DIAGNOSIS — I959 Hypotension, unspecified: Secondary | ICD-10-CM | POA: Insufficient documentation

## 2022-08-02 LAB — HEPATIC FUNCTION PANEL
ALT: 26 U/L (ref 10–40)
AST: 30 (ref 14–40)
Alkaline Phosphatase: 51 (ref 25–125)
Bilirubin, Total: 0.3

## 2022-08-02 LAB — CBC AND DIFFERENTIAL
HCT: 30 — AB (ref 41–53)
Hemoglobin: 9.4 — AB (ref 13.5–17.5)
Neutrophils Absolute: 3.52
Platelets: 223 10*3/uL (ref 150–400)
WBC: 70.3

## 2022-08-02 LAB — BASIC METABOLIC PANEL
BUN: 22 — AB (ref 4–21)
CO2: 25 — AB (ref 13–22)
Chloride: 106 (ref 99–108)
Creatinine: 0.9 (ref 0.6–1.3)
Glucose: 153
Potassium: 4.1 mEq/L (ref 3.5–5.1)
Sodium: 137 (ref 137–147)

## 2022-08-02 LAB — CBC: RBC: 3.4 — AB (ref 3.87–5.11)

## 2022-08-02 LAB — COMPREHENSIVE METABOLIC PANEL
Albumin: 3 — AB (ref 3.5–5.0)
Calcium: 8.3 — AB (ref 8.7–10.7)

## 2022-08-02 MED ORDER — MIDODRINE HCL 5 MG PO TABS
5.0000 mg | ORAL_TABLET | Freq: Three times a day (TID) | ORAL | 3 refills | Status: DC
Start: 2022-08-02 — End: 2022-08-03

## 2022-08-02 MED ORDER — HEPARIN SOD (PORK) LOCK FLUSH 100 UNIT/ML IV SOLN
500.0000 [IU] | Freq: Once | INTRAVENOUS | Status: AC | PRN
  Administered 2022-08-02: 500 [IU]

## 2022-08-02 MED ORDER — SODIUM CHLORIDE 0.9% FLUSH
10.0000 mL | INTRAVENOUS | Status: DC | PRN
  Administered 2022-08-02: 10 mL

## 2022-08-02 NOTE — Progress Notes (Signed)
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  He received 8 cycles with improvement in his skin lesions.  Unfortunately, he has had progressive worsening fatigue and debility and so we have stopped his infusions.  The lesions of his skin fluctuate but overall are improved.   CLL (chronic lymphocytic leukemia) (HCC) We held off on treatment of his CLL due to multiple medical problems.  His skin cancer is now being treated with cemiplimab and he is tolerating this well.  He has worsening anemia, now requiring transfusion.  This is likely due to his CLL, as he received IV iron recently. He started acalabrutinib 100 mg twice daily for his CLL on July 21st. He has had an increase in his white blood cell count since starting acalabrutinib.  An intial increase in the lymphocytosis is common with acalabrutinib.  His white blood cell count has decreased somewhat from 222,000 to 212,000.  He has persistent anemia without definite nutritional deficiency, so this was felt to be due to his CLL.  He then reported black stool.  He was started on pantoprazole 40 mg daily.  He had worsening diarrhea and stool studies revealed enteropathogenic E. Coli, which was treated with ciprofloxacin.   He had persistent melana and stool Hemoccults x3 were positive, so alcalabrutinib was discontinued on September 15th.  His white blood cell count decreased to 109,900 and his hemoglobin improved to 9.2 on  September 21. His WBC came down to 15,000 in November, 2023. Since stopping treatment, his white count is rising again to 49,000, then 57,000 in January, 2024, 58,000 in February, and 68,000 in March. His ANC has improved and his ALC was relatively stable at 61,000.   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 He was transfused in August of 2023, when his hemoglobin dropped to 8 and he was very symptomatic. He last received IV iron replacement in June.  He required transfusion in August when his hemoglobin dropped to 8 and he was symptomatic. His hemoglobin has been better since December. It was up to 10.4 in February and now down to 9.4.   Plan:  He will receive his next Libtayo on 08/05/2022. I advised him to resume Proamatine 5mg  TID. He had been placed on this when he was discharged from the hospital and nursing home, but apparently had not been continuing it. Patient had his port flushed at today's visit. I will see him back in 3 weeks with CBC and CMP. The patient and his daughter understand the plans discussed today and are in agreement with them.  They know to contact our office if he develops concerns prior to his next appointment.  I provided 20 minutes of face-to-face time during this this encounter and > 50% was spent counseling as documented under my assessment and plan.   Dellia Beckwith, MD  [facility] [address] [place] [address] Dept: [phone] Dept  Fax: [phone]   No orders of the defined types were placed in this encounter.    CHIEF COMPLAINT:  CC:  Squamous cell skin cancers  Current Treatment: Surveillance  HISTORY OF PRESENT ILLNESS:   Oncology History  CLL (chronic lymphocytic leukemia)  05/25/2015 Initial Diagnosis   CLL (chronic lymphocytic leukemia) (HCC)   02/04/2021 Cancer Staging   Staging form: Chronic Lymphocytic Leukemia / Small Lymphocytic  Lymphoma, AJCC 8th Edition - Clinical stage from 02/04/2021: Modified Rai Stage III (Modified Rai risk: High, Binet: Stage A, Lymphocytosis: Present, Adenopathy: Absent, Organomegaly: Absent, Anemia: Present, Thrombocytopenia: Absent) - Signed by Dellia Beckwith, MD on 02/08/2021 Stage prefix: Recurrence Absolute lymphocyte count (ALC) (cells/uL): 100 Hemoglobin (Hgb) (g/dL): 82.9 Platelet count (x10E9/L of blood): 184   Cancer of the skin, basal cell  05/06/2021 Initial Diagnosis   Cancer of the skin, basal cell   08/30/2021 - 12/31/2021 Chemotherapy   Patient is on Treatment Plan : SQUAMOUS SKIN Cemiplimab q21d     08/30/2021 - 01/21/2022 Chemotherapy   Patient is on Treatment Plan : ADVANCED CUTANEOUS SQUAMOUS CELL CARCINOMA Cemiplimab q21d     07/15/2022 -  Chemotherapy   Patient is on Treatment Plan : ADVANCED CUTANEOUS SQUAMOUS CELL CARCINOMA Cemiplimab q21d     BCC (basal cell carcinoma), face  12/28/2016 Initial Diagnosis   BCC (basal cell carcinoma), face   07/15/2022 -  Chemotherapy   Patient is on Treatment Plan : ADVANCED CUTANEOUS SQUAMOUS CELL CARCINOMA Cemiplimab q21d         INTERVAL HISTORY:  Akim is here today for repeat clinical assessment after stopping acalabrutinib for his CLL for his skin cancers a few months ago. He was started back [place] last month and he has facial skin lesions which are coming and going. Patient states that he is fine but weak and has no complaints of pain. His WBC went from 68.7 to 70.3 with an ANC of 7000, ALC of 61,000 and hemoglobin from 10.0 to 9.4. His day 1 cycle 2 is scheduled on 08/05/2022. His BP at today's visit is at 95/57 and at his last visit on 07/15/2022 his BP was 97/59. I advised him to resume Proamatine 5mg  TID. He had been placed on this when he was discharged from the hospital and nursing home, but apparently had not been continuing it.  I will see him back in 3 weeks with CBC and CMP.  He denies signs of infection such as  sore throat, sinus drainage, cough, or urinary symptoms.  He denies fevers or recurrent chills. He denies pain. He denies nausea, vomiting, chest pain, dyspnea or cough. His appetite is good and his weight has increased 3 pounds over last month .He is accompanied by his daughter at today's visit.   REVIEW OF SYSTEMS:  Review of Systems  Constitutional:  Positive for fatigue. Negative for appetite change, chills, diaphoresis, fever and unexpected weight change.  HENT:  Negative.  Negative for hearing loss, lump/mass, mouth sores, nosebleeds, sore throat, tinnitus, trouble swallowing and voice change.   Eyes: Negative.  Negative for eye problems and icterus.  Respiratory: Negative.  Negative for chest tightness, cough, hemoptysis, shortness of breath and wheezing.   Cardiovascular: Negative.  Negative for chest pain, leg swelling and palpitations.  Gastrointestinal: Negative.  Negative for abdominal distention, abdominal pain, blood in stool, constipation, diarrhea, nausea, rectal pain and vomiting.  Endocrine: Negative.  Negative for hot flashes.  Genitourinary: Negative.  Negative for bladder incontinence, difficulty urinating, dyspareunia, dysuria, frequency, hematuria,  nocturia, pelvic pain and penile discharge.   Musculoskeletal:  Negative for arthralgias, back pain, flank pain, gait problem, myalgias, neck pain and neck stiffness.  Skin: Negative.  Negative for itching, rash and wound.       keratotic skin lesions w/ erythema  Neurological:  Positive for extremity weakness. Negative for dizziness, gait problem, headaches, light-headedness, numbness, seizures and speech difficulty.       Generalized weakness.  Hematological: Negative.  Negative for adenopathy. Does not bruise/bleed easily.  Psychiatric/Behavioral: Negative.  Negative for confusion, decreased concentration, depression, sleep disturbance and suicidal ideas. The patient is not nervous/anxious.      VITALS:  Blood pressure (!)  95/57, pulse 64, temperature 98.2 F (36.8 C), temperature source Oral, resp. rate 18, height  (1.778 m), weight 154 lb 12.8 oz (70.2 kg), SpO2 100 %.  Wt Readings from Last 3 Encounters:  08/02/22 154 lb 12.8 oz (70.2 kg)  07/15/22 162 lb (73.5 kg)  07/07/22 151 lb 12.8 oz (68.9 kg)    Body mass index is 22.21 kg/m.  Performance status (ECOG): 2 - Symptomatic, <50% confined to bed  PHYSICAL EXAM:  Physical Exam Vitals and nursing note reviewed. Exam conducted with a chaperone present.  Constitutional:      General: He is not in acute distress.    Appearance: Normal appearance. He is normal weight. He is not ill-appearing, toxic-appearing or diaphoretic.  HENT:     Head: Normocephalic and atraumatic.     Right Ear: Tympanic membrane, ear canal and external ear normal. There is no impacted cerumen.     Left Ear: Tympanic membrane, ear canal and external ear normal. There is no impacted cerumen.     Nose: Nose normal. No congestion or rhinorrhea.     Mouth/Throat:     Mouth: Mucous membranes are moist.     Pharynx: Oropharynx is clear. No oropharyngeal exudate or posterior oropharyngeal erythema.  Eyes:     General: No scleral icterus.       Right eye: No discharge.        Left eye: No discharge.     Extraocular Movements: Extraocular movements intact.     Conjunctiva/sclera: Conjunctivae normal.     Pupils: Pupils are equal, round, and reactive to light.  Neck:     Vascular: No carotid bruit.  Cardiovascular:     Rate and Rhythm: Normal rate and regular rhythm.     Pulses: Normal pulses.     Heart sounds: Normal heart sounds. No murmur heard.    No friction rub. No gallop.  Pulmonary:     Effort: Pulmonary effort is normal. No respiratory distress.     Breath sounds: Normal breath sounds. No stridor. No wheezing, rhonchi or rales.  Chest:     Chest wall: No tenderness.  Abdominal:     General: Bowel sounds are normal. There is no distension.     Palpations: Abdomen  is soft. There is no hepatomegaly, splenomegaly or mass.     Tenderness: There is no abdominal tenderness. There is no right CVA tenderness, left CVA tenderness, guarding or rebound.     Hernia: No hernia is present.  Musculoskeletal:        General: No swelling, tenderness, deformity or signs of injury. Normal range of motion.     Cervical back: Normal range of motion and neck supple. No rigidity or tenderness.     Right lower leg: 1+ Edema present.     Left lower leg: 1+ Edema  present.     Comments: 1-2+ edema bilateral   Lymphadenopathy:     Cervical: No cervical adenopathy.     Upper Body:     Right upper body: No supraclavicular or axillary adenopathy.     Left upper body: No supraclavicular or axillary adenopathy.  Skin:    General: Skin is warm and dry.     Coloration: Skin is not jaundiced or pale.     Findings: Lesion present. No bruising, erythema or rash.     Comments: Multiple areas of erythema of the skin on the scalp, face, dorsum of the left hand, anterior tibia associate with keratotic lesions and probable skin cancers.    Neurological:     General: No focal deficit present.     Mental Status: He is alert and oriented to person, place, and time. Mental status is at baseline.     Cranial Nerves: No cranial nerve deficit.     Sensory: No sensory deficit.     Motor: No weakness.     Coordination: Coordination normal.     Gait: Gait normal.     Deep Tendon Reflexes: Reflexes normal.  Psychiatric:        Mood and Affect: Mood normal.        Behavior: Behavior normal.        Thought Content: Thought content normal.        Judgment: Judgment normal.     LABS:      Latest Ref Rng & Units 08/02/2022   12:00 AM 07/07/2022   12:00 AM 06/08/2022   12:00 AM  CBC  WBC  70.3     68.1     58.7      Hemoglobin 13.5 - 17.5 9.4     10.0     10.4      Hematocrit 41 - 53 30     32     33      Platelets 150 - 400 K/uL 223     207     207         This result is from an external  source.      Latest Ref Rng & Units 08/02/2022   12:00 AM 07/07/2022   12:00 AM 06/08/2022   12:00 AM  CMP  BUN 4 - Creatinine 0.6 - 1.3 0.9     1.0     1.0      Sodium 137 - 147 137     138     138      Potassium 3.5 - 5.1 mEq/L 4.1     4.4     4.3      Chloride 99 - 108 106     106     105      CO2 13 - Calcium 8.7 - 10.7 8.3     8.4     8.4      Alkaline Phos 25 - 125 51     54     62      AST 14 - 40 ALT 10 - 40 U/L 26     23     20  This result is from an external source.   Component Ref Range & Units 3 wk ago (07/08/22) 3 mo ago (04/14/22) 5 mo ago (03/03/22) 5 mo ago (02/10/22) 6 mo ago (01/20/22) 7 mo ago (12/30/21) 7 mo ago (12/08/21)  TSH 0.350 - 4.500 uIU/mL 1.748 2.417 CM 2.615 CM 1.543 CM 1.544 CM 1.677 CM 2.074 CM     Component Ref Range & Units 3 wk ago (07/08/22) 3 mo ago (04/14/22) 5 mo ago (03/03/22) 5 mo ago (02/10/22) 7 mo ago (12/30/21) 7 mo ago (12/08/21) 8 mo ago (11/17/21)  T4, Total 4.5 - 12.0 ug/dL 8.0 8.7 CM 8.1 CM 8.3 CM 9.4 CM 9.4 CM 10.1 CM       No results found for: "CEA1", "CEA" / No results found for: "CEA1", "CEA" No results found for: "PSA1" No results found for: "WUJ811" No results found for: "CAN125"  No results found for: "TOTALPROTELP", "ALBUMINELP", "A1GS", "A2GS", "BETS", "BETA2SER", "GAMS", "MSPIKE", "SPEI" Lab Results  Component Value Date   TIBC 267 12/31/2021   TIBC 291 09/15/2021   TIBC 341 06/04/2021   FERRITIN 248 12/31/2021   FERRITIN 48 09/15/2021   FERRITIN 53 06/04/2021   IRONPCTSAT 22 12/31/2021   IRONPCTSAT 13 (L) 09/15/2021   IRONPCTSAT 11 (L) 06/04/2021   Lab Results  Component Value Date   LDH 166 08/03/2020    STUDIES:  No results found.    HISTORY:   Past Medical History:  Diagnosis Date   B12 deficiency    Cancer of the skin, 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   CHF (congestive heart failure)    Diabetes mellitus without  complication    History of prostate cancer    Hyperlipidemia    Hypertension    Hypoglycemia 05/06/2021   Iron deficiency anemia 02/20/2020   Leucocytosis    Leukemia, lymphocytic, chronic     Past Surgical History:  Procedure Laterality Date   PROSTATE BIOPSY      Family History  Problem Relation Age of Onset   Ovarian cancer Mother 3   Cancer Sister 75    Social History:  reports that he has quit smoking. He has never used smokeless tobacco. He reports that he does not currently use alcohol. He reports that he does not use drugs.The patient is accompanied by his daughter today.  Allergies: No Known Allergies  Current Medications: Current Outpatient Medications  Medication Sig Dispense Refill   acetaminophen (TYLENOL) 500 MG tablet Take 1,000 mg by mouth every 6 (six) hours as needed for mild pain, moderate pain or fever.     amiodarone (PACERONE) 200 MG tablet Take 200 mg by mouth daily.     apixaban (ELIQUIS) 5 MG TABS tablet Take 1 tablet by mouth 2 (two) times daily.     Ascorbic Acid (VITAMIN C) 500 MG CAPS Take 500 mg by mouth daily.     bismuth subsalicylate (PEPTO BISMOL) 262 MG/15ML suspension Take 30 mLs by mouth every 6 (six) hours as needed. For constipation     cholecalciferol (VITAMIN D3) 25 MCG (1000 UNIT) tablet Take 1,000 Units by mouth daily.     furosemide (LASIX) 40 MG tablet Take 0.5 tablets (20 mg total) by mouth daily. 30 tablet 0   ketoconazole (NIZORAL) 2 % shampoo Apply topically 3 (three) times a week.     levothyroxine (SYNTHROID) 75 MCG tablet Take 75 mcg by mouth daily before breakfast.     metFORMIN (GLUCOPHAGE-XR) 500 MG 24 hr tablet Take 500 mg by mouth 2 (two)  times daily.     midodrine (PROAMATINE) 5 MG tablet Take 1 tablet (5 mg total) by mouth 3 (three) times daily with meals. 90 tablet 3   ofloxacin (OCUFLOX) 0.3 % ophthalmic solution 1 drop 4 (four) times daily.     Omega-3 Fatty Acids (FISH OIL) 1000 MG CAPS Take 1,000 mg by mouth daily.      ondansetron (ZOFRAN) 4 MG tablet Take 1 tablet (4 mg total) by mouth every 6 (six) hours as needed for nausea. 20 tablet 0   pantoprazole (PROTONIX) 40 MG tablet TAKE 1 TABLET BY MOUTH EVERY DAY 90 tablet 1   polyethylene glycol (MIRALAX / GLYCOLAX) 17 g packet Take 17 g by mouth daily as needed for mild constipation. 14 each 0   rOPINIRole (REQUIP) 0.5 MG tablet Take 0.5-1 mg by mouth See admin instructions. Taking one tablet at bedtime, if no relief take an additional tablet     rosuvastatin (CRESTOR) 20 MG tablet Take 10 mg by mouth daily.     vitamin B-12 (CYANOCOBALAMIN) 500 MCG tablet Take 500 mcg by mouth daily.     Current Facility-Administered Medications  Medication Dose Route Frequency Provider Last Rate Last Admin   sodium chloride flush (NS) 0.9 % injection 10 mL  10 mL Intracatheter PRN Mosher, Harvin Hazel A, PA-C   10 mL at 08/02/22 1532     I,Jasmine M Lassiter,acting as a scribe for Dellia Beckwith, MD.,have documented all relevant documentation on the behalf of Dellia Beckwith, MD,as directed by  Dellia Beckwith, MD while in the presence of Dellia Beckwith, MD.

## 2022-08-02 NOTE — Progress Notes (Signed)
CRITICAL VALUE STICKER  CRITICAL VALUE:   WBC 70.3  RECEIVER (on-site recipient of call):  Georgette Shell RN   [place] NOTIFIED:   08/02/2022 @ 1552  MESSENGER (representative from lab):  Caryl Pina [facility] [address] lab  MD NOTIFIED:   Dr. Hinton Rao  TIME OF NOTIFICATION:  1555   RESPONSE: Scheduled to see patent shortly.

## 2022-08-03 ENCOUNTER — Other Ambulatory Visit: Payer: Self-pay | Admitting: Oncology

## 2022-08-03 DIAGNOSIS — I959 Hypotension, unspecified: Secondary | ICD-10-CM

## 2022-08-03 MED ORDER — MIDODRINE HCL 5 MG PO TABS
5.0000 mg | ORAL_TABLET | Freq: Three times a day (TID) | ORAL | 3 refills | Status: DC
Start: 2022-08-03 — End: 2022-08-03

## 2022-08-03 MED ORDER — MIDODRINE HCL 5 MG PO TABS
5.0000 mg | ORAL_TABLET | Freq: Three times a day (TID) | ORAL | 3 refills | Status: DC
Start: 2022-08-03 — End: 2022-11-01

## 2022-08-05 ENCOUNTER — Inpatient Hospital Stay: Payer: Medicare Other

## 2022-08-05 VITALS — BP 133/63 | HR 63 | Temp 97.8°F | Resp 18 | Ht 70.0 in

## 2022-08-05 DIAGNOSIS — Z5112 Encounter for antineoplastic immunotherapy: Secondary | ICD-10-CM | POA: Diagnosis not present

## 2022-08-05 DIAGNOSIS — C4491 Basal cell carcinoma of skin, unspecified: Secondary | ICD-10-CM

## 2022-08-05 DIAGNOSIS — C44310 Basal cell carcinoma of skin of unspecified parts of face: Secondary | ICD-10-CM

## 2022-08-05 MED ORDER — SODIUM CHLORIDE 0.9 % IV SOLN
350.0000 mg | Freq: Once | INTRAVENOUS | Status: AC
  Administered 2022-08-05: 350 mg via INTRAVENOUS
  Filled 2022-08-05: qty 7

## 2022-08-05 MED ORDER — SODIUM CHLORIDE 0.9 % IV SOLN: Freq: Once | INTRAVENOUS | Status: AC

## 2022-08-05 NOTE — Patient Instructions (Signed)
Cemiplimab Injection What is this medication? CEMIPLIMAB (se MIP li mab) treats skin cancer and lung cancer. It works by helping your immune system slow or stop the spread of cancer cells. It is a monoclonal antibody. This medicine may be used for other purposes; ask your health care provider or pharmacist if you have questions. COMMON BRAND NAME(S): LIBTAYO What should I tell my care team before I take this medication? They need to know if you have any of these conditions: Allogeneic stem cell transplant (uses someone else's stem cells) Autoimmune diseases, such as Crohn's disease, ulcerative colitis, or lupus Diabetes Nervous system problems, such as myasthenia gravis or Guillain-Barre syndrome Organ transplant Recent or ongoing radiation Thyroid disease An unusual or allergic reaction to cemiplimab, other medications, foods, dyes, or preservatives Pregnant or trying to get pregnant Breast-feeding How should I use this medication? This medication is infused into a vein. It is given by your care team in a hospital or clinic setting. A special MedGuide will be given to you before each treatment. Be sure to read this information carefully each time. Talk to your care team about the use of this medication in children. Special care may be needed. Overdosage: If you think you have taken too much of this medicine contact a poison control center or emergency room at once. NOTE: This medicine is only for you. Do not share this medicine with others. What if I miss a dose? Keep appointments for follow-up doses. It is important not to miss your dose. Call your care team if you are unable to keep an appointment. What may interact with this medication? Interactions have not been studied. This list may not describe all possible interactions. Give your health care provider a list of all the medicines, herbs, non-prescription drugs, or dietary supplements you use. Also tell them if you smoke, drink  alcohol, or use illegal drugs. Some items may interact with your medicine. What should I watch for while using this medication? This medication may make you feel generally unwell. This is not uncommon as chemotherapy can affect healthy cells as well as cancer cells. Report any side effects. Continue your course of treatment even though you feel ill until your care team tells you to stop. You may need blood work done while you are taking this medication. This medication may cause serious skin reactions. They can happen in the weeks to months after starting the medication. Contact your care team right away if you notice fevers or flu-like symptoms with a rash. The rash may be red or purple and then turn into blisters or peeling of the skin. You may also notice a red rash with swelling of the face, lips, or lymph nodes in your neck or under your arms. Tell your care team right away if you have any changes in your vision. This medication may increase blood sugar. The risk may be higher in patients who already have diabetes. Ask your care team what you can do to lower your risk of diabetes while taking this medication. Talk to your care team if you wish to become pregnant or think you might be pregnant. This medication can cause serious birth defects if taken during pregnancy. A negative pregnancy test is required before starting this medication. A reliable form of contraception is recommended while taking this medication and for at least 4 months after stopping it. Do not breast-feed while taking this medication and for at least 4 months after stopping it. What side effects may I notice  from receiving this medication? Side effects that you should report to your care team as soon as possible: Allergic reactions--skin rash, itching, hives, swelling of the face, lips, tongue, or throat Dry cough, shortness of breath or trouble breathing Eye pain, redness, irritation, or discharge with blurry or decreased  vision Heart muscle inflammation--unusual weakness or fatigue, shortness of breath, chest pain, fast or irregular heartbeat, dizziness, swelling of the ankles, feet, or hands Hormone gland problems--headache, sensitivity to light, unusual weakness or fatigue, dizziness, fast or irregular heartbeat, increased sensitivity to cold or heat, excessive sweating, constipation, hair loss, increased thirst or amount of urine, tremors or shaking, irritability Infusion reactions--chest pain, shortness of breath or trouble breathing, feeling faint or lightheaded Kidney injury (glomerulonephritis)--decrease in the amount of urine, red or dark brown urine, foamy or bubbly urine, swelling of the ankles, hands, or feet Liver injury--right upper belly pain, loss of appetite, nausea, light-colored stool, dark yellow or brown urine, yellowing skin or eyes, unusual weakness or fatigue Pain, tingling, or numbness in the hands or feet, muscle weakness, change in vision, confusion or trouble speaking, loss of balance or coordination, trouble walking, seizures Rash, fever, and swollen lymph nodes Redness, blistering, peeling, or loosening of the skin, including inside the mouth Sudden or severe stomach pain, bloody diarrhea, fever, nausea, vomiting Side effects that usually do not require medical attention (report these to your care team if they continue or are bothersome): Bone, joint, or muscle pain Diarrhea Fatigue Loss of appetite Nausea Skin rash This list may not describe all possible side effects. Call your doctor for medical advice about side effects. You may report side effects to FDA at 1-800-FDA-1088. Where should I keep my medication? This medication is given in a hospital or clinic and will not be stored at home. NOTE: This sheet is a summary. It may not cover all possible information. If you have questions about this medicine, talk to your doctor, pharmacist, or health care provider.  2023 Elsevier/Gold  Standard (2021-03-19 00:00:00)

## 2022-08-22 ENCOUNTER — Encounter: Payer: Self-pay | Admitting: Hematology and Oncology

## 2022-08-22 ENCOUNTER — Encounter: Payer: Self-pay | Admitting: Oncology

## 2022-08-23 NOTE — Progress Notes (Signed)
[facility]  [address] 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  He received 8 cycles with improvement in his skin lesions.  Unfortunately, he has had progressive worsening fatigue and debility and so we stopped his infusions but have now resumed them as of February, 2024.  The lesions of his skin fluctuate but overall are improved.   CLL (chronic lymphocytic leukemia) (HCC) We held off on treatment of his CLL due to multiple medical problems.  His skin cancer is now being treated with cemiplimab and he is tolerating this well.  He has worsening anemia, now requiring transfusion.  This is likely due to his CLL, as he received IV iron recently. He started acalabrutinib 100 mg twice daily for his CLL on July 21st. He has had an increase in his white blood cell count since starting acalabrutinib.  An intial increase in the lymphocytosis is common with acalabrutinib.  His white blood cell count has decreased somewhat from 222,000 to 212,000.  He has persistent anemia without definite nutritional deficiency, so this was felt to be due to his CLL.  He then reported black stool.  He was started on pantoprazole 40 mg daily.  He had worsening diarrhea and stool studies revealed enteropathogenic E. Coli, which was treated with ciprofloxacin.   He had persistent melana and stool Hemoccults x3 were positive, so alcalabrutinib was discontinued on September 15th, 2023.  His white blood cell count decreased to  109,900 and his hemoglobin improved to 9.2 on September 21. His WBC came down to 15,000 in November, 2023. Since stopping treatment, his white count is rising again to 49,000, then 57,000 in January, 2024, 58,000 in February, and 68,000 in March. His WBC has leveled off for the last two checkups.    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 He was transfused in August of 2023, when his hemoglobin dropped to 8 and he was very symptomatic. He last received IV iron replacement in June.  He required transfusion in August when his hemoglobin dropped to 8 and he was symptomatic. His hemoglobin has been better since December. It was up to 10.4 in February and went down to 9.4.   Plan:  He stopped acalabrutinib for his CLL in September, 2023. His WBC has steadily increased over the last 6 months but has now leveled out. We resumed the Libtayo about 2 months ago for his many skin cancers and these appear to be stable to improved.  He has scattered keratoses and skin cancers of his face and ears. The right side of the nose has improved but he has a hemorrhagic area on the left side  that is not healing. His left cheek has a deeper area but the rest is stable. His WBC is stable at 68.7, hemoglobin increased from 9.4 to 9.6, and his platelet count is 214,000.  His day 1 cycle 3 of Libtayo is scheduled on 08/26/2022. I will see him back in 3 weeks with CBC and CMP, and  2 days later he will receive Libtayo. Patient had his port flushed at today's visit. I will see him back in 3 weeks with CBC and CMP. The patient and his daughter understand the plans discussed today and are in agreement with them.  They know to contact our office if he develops concerns prior to his next appointment.  I provided 15 minutes of face-to-face time during this this encounter and > 50% was spent counseling as documented under my assessment and plan.   Dellia Beckwith, MD  [facility] [address] [place] [address] Dept: [phone] Dept Fax: [phone]   No orders of the defined types were placed in this encounter.    CHIEF COMPLAINT:  CC:  Squamous cell skin cancers, CLL  Current Treatment: Libtayo  HISTORY OF PRESENT ILLNESS:   Oncology History  CLL (chronic lymphocytic leukemia) (HCC)  05/25/2015 Initial Diagnosis   CLL (chronic lymphocytic leukemia) (HCC)   02/04/2021 Cancer Staging   Staging form: Chronic Lymphocytic Leukemia / Small Lymphocytic Lymphoma, AJCC 8th Edition - Clinical stage from 02/04/2021: Modified Rai Stage III (Modified Rai risk: High, Binet: Stage A, Lymphocytosis: Present, Adenopathy: Absent, Organomegaly: Absent, Anemia: Present, Thrombocytopenia: Absent) - Signed by Dellia Beckwith, MD on 02/08/2021 Stage prefix: Recurrence Absolute lymphocyte count (ALC) (cells/uL): 100 Hemoglobin (Hgb) (g/dL): 13.0 Platelet count (x10E9/L of blood): 184   Cancer of the skin, basal cell  05/06/2021 Initial Diagnosis   Cancer of the skin, basal cell   08/30/2021 - 12/31/2021 Chemotherapy   Patient is on Treatment Plan : SQUAMOUS SKIN Cemiplimab q21d     08/30/2021 - 01/21/2022 Chemotherapy   Patient is on Treatment Plan : ADVANCED CUTANEOUS SQUAMOUS CELL CARCINOMA Cemiplimab q21d     07/15/2022 -  Chemotherapy   Patient is on Treatment Plan : ADVANCED CUTANEOUS SQUAMOUS CELL CARCINOMA Cemiplimab q21d     BCC (basal cell carcinoma), face  12/28/2016 Initial Diagnosis   BCC (basal cell carcinoma), face   07/15/2022 -  Chemotherapy   Patient is on Treatment Plan : ADVANCED CUTANEOUS SQUAMOUS CELL CARCINOMA Cemiplimab q21d        INTERVAL HISTORY:  Fernando Young is here today for repeat clinical assessment after stopping acalabrutinib for his CLL in September, 2023. His WBC has steadily increased over the last 6 months but has now leveled out. We resumed the Libtayo about 2 months ago for his many skin cancers,  primarily squamous cell type but also basal cell carcinomas, and these appear to be stable to improved.  Patient states that he feels ok and has no complaints of pain. He has scattered keratoses and skin cancers of his face and ears. The right side of the nose has improved but he has a hemorrhagic area on the left side  that is not healing. His left cheek has a deeper area but the rest is stable. His WBC is stable at 68.7, hemoglobin increased from 9.4 to 9.6, and his platelet count is 214,000.  His day 1 cycle 3 of Libtayo is scheduled on 08/26/2022. I will see him back in 3 weeks with CBC and CMP, and 2 days later he will receive Libtayo. He denies signs of infection such as sore throat, sinus drainage, cough, or urinary symptoms.  He denies fevers or recurrent chills. He denies pain. He denies nausea, vomiting, chest pain, dyspnea or cough. His appetite is alright and his weight has increased 1 pounds over last 20 days .  He is accompanied by his daughter at today's visit as always.   REVIEW OF SYSTEMS:  Review of Systems  Constitutional:  Positive for fatigue. Negative for appetite change, chills, diaphoresis, fever and unexpected weight change.  HENT:  Negative.  Negative for hearing loss, lump/mass, mouth sores, nosebleeds, sore throat, tinnitus, trouble swallowing and voice change.   Eyes: Negative.  Negative for eye problems and icterus.  Respiratory: Negative.  Negative for chest tightness, cough, hemoptysis, shortness of breath and wheezing.   Cardiovascular: Negative.  Negative for chest pain, leg swelling and palpitations.  Gastrointestinal: Negative.  Negative for abdominal distention, abdominal pain, blood in stool, constipation, diarrhea, nausea, rectal pain and vomiting.  Endocrine: Negative.  Negative for hot flashes.  Genitourinary: Negative.  Negative for bladder incontinence, difficulty urinating, dyspareunia, dysuria, frequency, hematuria, nocturia, pelvic pain and penile discharge.    Musculoskeletal:  Negative for arthralgias, back pain, flank pain, gait problem, myalgias, neck pain and neck stiffness.  Skin: Negative.  Negative for itching, rash and wound.       keratotic skin lesions w/ erythema  Neurological:  Positive for extremity weakness. Negative for dizziness, gait problem, headaches, light-headedness, numbness, seizures and speech difficulty.       Generalized weakness.  Hematological: Negative.  Negative for adenopathy. Does not bruise/bleed easily.  Psychiatric/Behavioral: Negative.  Negative for confusion, decreased concentration, depression, sleep disturbance and suicidal ideas. The patient is not nervous/anxious.      VITALS:  Blood pressure (!) 96/58, pulse 98, temperature 98.1 F (36.7 C), temperature source Oral, resp. rate 20, height 5\' 10"  (1.778 m), weight 155 lb 3.2 oz (70.4 kg), SpO2 100 %.  Wt Readings from Last 3 Encounters:  08/26/22 155 lb 0.6 oz (70.3 kg)  08/24/22 155 lb 3.2 oz (70.4 kg)  08/02/22 154 lb 12.8 oz (70.2 kg)    Body mass index is 22.27 kg/m.  Performance status (ECOG): 2 - Symptomatic, <50% confined to bed  PHYSICAL EXAM:  Physical Exam Vitals and nursing note reviewed. Exam conducted with a chaperone present.  Constitutional:      General: He is not in acute distress.    Appearance: Normal appearance. He is normal weight. He is not ill-appearing, toxic-appearing or diaphoretic.  HENT:     Head: Normocephalic and atraumatic.     Comments: scattered keratoses and skin cancers of his face and ears. The right side of the nose has improved but he has a hemorrhagic area on the left side  that is not healing. His left cheek has a deeper area but the rest is stable.  1 scaly area of the right temple.  Verrucous lesion of the pinna of the left ear.    Right Ear: Tympanic membrane, ear canal and external ear normal. There is no impacted cerumen.     Left Ear: Tympanic membrane, ear canal and external ear normal. There is no  impacted cerumen.     Nose: Nose normal. No congestion or rhinorrhea.     Mouth/Throat:     Mouth: Mucous membranes are moist.     Pharynx: Oropharynx is clear. No oropharyngeal exudate or posterior oropharyngeal erythema.  Eyes:     General: No scleral icterus.       Right eye: No discharge.        Left eye: No discharge.     Extraocular Movements: Extraocular movements intact.     Conjunctiva/sclera: Conjunctivae normal.     Pupils: Pupils are equal, round, and reactive to light.  Neck:  Vascular: No carotid bruit.  Cardiovascular:     Rate and Rhythm: Normal rate and regular rhythm.     Pulses: Normal pulses.     Heart sounds: Normal heart sounds. No murmur heard.    No friction rub. No gallop.  Pulmonary:     Effort: Pulmonary effort is normal. No respiratory distress.     Breath sounds: Normal breath sounds. No stridor. No wheezing, rhonchi or rales.  Chest:     Chest wall: No tenderness.  Abdominal:     General: Bowel sounds are normal. There is no distension.     Palpations: Abdomen is soft. There is no hepatomegaly, splenomegaly or mass.     Tenderness: There is no abdominal tenderness. There is no right CVA tenderness, left CVA tenderness, guarding or rebound.     Hernia: No hernia is present.  Musculoskeletal:        General: No swelling, tenderness, deformity or signs of injury. Normal range of motion.     Cervical back: Normal range of motion and neck supple. No rigidity or tenderness.     Right lower leg: 2+ Edema present.     Left lower leg: 2+ Edema present.     Comments: Areas of scaly induration of the interior tibias bilaterally.    Lymphadenopathy:     Cervical: No cervical adenopathy.     Upper Body:     Right upper body: No supraclavicular or axillary adenopathy.     Left upper body: No supraclavicular or axillary adenopathy.  Skin:    General: Skin is warm and dry.     Coloration: Skin is not jaundiced or pale.     Findings: Lesion present. No  bruising, erythema or rash.     Comments: Multiple areas of erythema of the skin on the scalp, face, ears, and dorsum of the left hand, anterior tibia associate with keratotic lesions and probable skin cancers.    Neurological:     General: No focal deficit present.     Mental Status: He is alert and oriented to person, place, and time. Mental status is at baseline.     Cranial Nerves: No cranial nerve deficit.     Sensory: No sensory deficit.     Motor: No weakness.     Coordination: Coordination normal.     Gait: Gait normal.     Deep Tendon Reflexes: Reflexes normal.  Psychiatric:        Mood and Affect: Mood normal.        Behavior: Behavior normal.        Thought Content: Thought content normal.        Judgment: Judgment normal.     LABS:      Latest Ref Rng & Units 08/24/2022   12:00 AM 08/02/2022   12:00 AM 07/07/2022   12:00 AM  CBC  WBC  68.7     70.3     68.1      Hemoglobin 13.5 - 17.5 9.6     9.4     10.0      Hematocrit 41 - 53 30     30     32      Platelets 150 - 400 K/uL 214     223     207         This result is from an external source.      Latest Ref Rng & Units 08/24/2022   12:00 AM 08/02/2022   12:00 AM 07/07/2022  12:00 AM  CMP  BUN 4 - 21 24     22     25       Creatinine 0.6 - 1.3 1.0     0.9     1.0      Sodium 137 - 147 139     137     138      Potassium 3.5 - 5.1 mEq/L 4.0     4.1     4.4      Chloride 99 - 108 106     106     106      CO2 13 - 22 26     25     26       Calcium 8.7 - 10.7 8.2     8.3     8.4      Alkaline Phos 25 - 125 65     51     54      AST 14 - 40 27     30     28       ALT 10 - 40 U/L 22     26     23          This result is from an external source.   Component Ref Range & Units 3 wk ago (07/08/22) 3 mo ago (04/14/22) 5 mo ago (03/03/22) 5 mo ago (02/10/22) 6 mo ago (01/20/22) 7 mo ago (12/30/21) 7 mo ago (12/08/21)  TSH 0.350 - 4.500 uIU/mL 1.748 2.417 CM 2.615 CM 1.543 CM 1.544 CM 1.677 CM 2.074 CM     Component Ref  Range & Units 3 wk ago (07/08/22) 3 mo ago (04/14/22) 5 mo ago (03/03/22) 5 mo ago (02/10/22) 7 mo ago (12/30/21) 7 mo ago (12/08/21) 8 mo ago (11/17/21)  T4, Total 4.5 - 12.0 ug/dL 8.0 8.7 CM 8.1 CM 8.3 CM 9.4 CM 9.4 CM 10.1 CM       No results found for: "CEA1", "CEA" / No results found for: "CEA1", "CEA" No results found for: "PSA1" No results found for: "ZOX096" No results found for: "CAN125"  No results found for: "TOTALPROTELP", "ALBUMINELP", "A1GS", "A2GS", "BETS", "BETA2SER", "GAMS", "MSPIKE", "SPEI" Lab Results  Component Value Date   TIBC 267 12/31/2021   TIBC 291 09/15/2021   TIBC 341 06/04/2021   FERRITIN 248 12/31/2021   FERRITIN 48 09/15/2021   FERRITIN 53 06/04/2021   IRONPCTSAT 22 12/31/2021   IRONPCTSAT 13 (L) 09/15/2021   IRONPCTSAT 11 (L) 06/04/2021   Lab Results  Component Value Date   LDH 166 08/03/2020    STUDIES:  No results found.    HISTORY:   Past Medical History:  Diagnosis Date   B12 deficiency    Cancer of the skin, 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   CHF (congestive heart failure) (HCC)    Diabetes mellitus without complication (HCC)    History of prostate cancer    Hyperlipidemia    Hypertension    Hypoglycemia 05/06/2021   Iron deficiency anemia 02/20/2020   Leucocytosis    Leukemia, lymphocytic, chronic (HCC)     Past Surgical History:  Procedure Laterality Date   PROSTATE BIOPSY      Family History  Problem Relation Age of Onset   Ovarian cancer Mother 75   Cancer Sister 6    Social History:  reports that he has quit smoking. He has never used smokeless tobacco. He reports that he does not currently use alcohol. He reports that he does  not use drugs.The patient is accompanied by his daughter today.  Allergies: No Known Allergies  Current Medications: Current Outpatient Medications  Medication Sig Dispense Refill   acetaminophen (TYLENOL) 500 MG tablet Take 1,000 mg by mouth every 6 (six) hours as needed for mild pain, moderate  pain or fever.     amiodarone (PACERONE) 200 MG tablet Take 200 mg by mouth daily.     apixaban (ELIQUIS) 5 MG TABS tablet Take 1 tablet by mouth 2 (two) times daily.     Ascorbic Acid (VITAMIN C) 500 MG CAPS Take 500 mg by mouth daily.     bismuth subsalicylate (PEPTO BISMOL) 262 MG/15ML suspension Take 30 mLs by mouth every 6 (six) hours as needed. For constipation     cholecalciferol (VITAMIN D3) 25 MCG (1000 UNIT) tablet Take 1,000 Units by mouth daily.     furosemide (LASIX) 40 MG tablet Take 0.5 tablets (20 mg total) by mouth daily. 30 tablet 0   ketoconazole (NIZORAL) 2 % shampoo Apply topically 3 (three) times a week.     levothyroxine (SYNTHROID) 75 MCG tablet Take 75 mcg by mouth daily before breakfast.     metFORMIN (GLUCOPHAGE-XR) 500 MG 24 hr tablet Take 500 mg by mouth 2 (two) times daily.     midodrine (PROAMATINE) 5 MG tablet Take 1 tablet (5 mg total) by mouth 3 (three) times daily with meals. 90 tablet 3   ofloxacin (OCUFLOX) 0.3 % ophthalmic solution 1 drop 4 (four) times daily.     Omega-3 Fatty Acids (FISH OIL) 1000 MG CAPS Take 1,000 mg by mouth daily.     ondansetron (ZOFRAN) 4 MG tablet Take 1 tablet (4 mg total) by mouth every 6 (six) hours as needed for nausea. 20 tablet 0   pantoprazole (PROTONIX) 40 MG tablet TAKE 1 TABLET BY MOUTH EVERY DAY 90 tablet 1   polyethylene glycol (MIRALAX / GLYCOLAX) 17 g packet Take 17 g by mouth daily as needed for mild constipation. 14 each 0   rOPINIRole (REQUIP) 0.5 MG tablet Take 0.5-1 mg by mouth See admin instructions. Taking one tablet at bedtime, if no relief take an additional tablet     rosuvastatin (CRESTOR) 20 MG tablet Take 10 mg by mouth daily.     vitamin B-12 (CYANOCOBALAMIN) 500 MCG tablet Take 500 mcg by mouth daily.     Current Facility-Administered Medications  Medication Dose Route Frequency Provider Last Rate Last Admin   sodium chloride flush (NS) 0.9 % injection 10 mL  10 mL Intracatheter PRN Mosher, Harvin Hazel A,  PA-C   10 mL at 08/24/22 1530     I,Jasmine M Lassiter,acting as a scribe for Dellia Beckwith, MD.,have documented all relevant documentation on the behalf of Dellia Beckwith, MD,as directed by  Dellia Beckwith, MD while in the presence of Dellia Beckwith, MD.

## 2022-08-24 ENCOUNTER — Telehealth: Payer: Self-pay

## 2022-08-24 ENCOUNTER — Inpatient Hospital Stay: Payer: Medicare Other

## 2022-08-24 ENCOUNTER — Inpatient Hospital Stay (INDEPENDENT_AMBULATORY_CARE_PROVIDER_SITE_OTHER): Payer: Medicare Other | Admitting: Oncology

## 2022-08-24 VITALS — BP 96/58 | HR 98 | Temp 98.1°F | Resp 20 | Ht 70.0 in | Wt 155.2 lb

## 2022-08-24 DIAGNOSIS — C9110 Chronic lymphocytic leukemia of B-cell type not having achieved remission: Secondary | ICD-10-CM | POA: Diagnosis not present

## 2022-08-24 DIAGNOSIS — C4491 Basal cell carcinoma of skin, unspecified: Secondary | ICD-10-CM

## 2022-08-24 DIAGNOSIS — C44310 Basal cell carcinoma of skin of unspecified parts of face: Secondary | ICD-10-CM

## 2022-08-24 DIAGNOSIS — C4440 Unspecified malignant neoplasm of skin of scalp and neck: Secondary | ICD-10-CM | POA: Diagnosis not present

## 2022-08-24 DIAGNOSIS — D509 Iron deficiency anemia, unspecified: Secondary | ICD-10-CM

## 2022-08-24 DIAGNOSIS — Z5112 Encounter for antineoplastic immunotherapy: Secondary | ICD-10-CM | POA: Diagnosis not present

## 2022-08-24 LAB — HEPATIC FUNCTION PANEL
ALT: 22 U/L (ref 10–40)
AST: 27 (ref 14–40)
Alkaline Phosphatase: 65 (ref 25–125)
Bilirubin, Total: 0.3

## 2022-08-24 LAB — BASIC METABOLIC PANEL
BUN: 24 — AB (ref 4–21)
CO2: 26 — AB (ref 13–22)
Chloride: 106 (ref 99–108)
Creatinine: 1 (ref 0.6–1.3)
EGFR: 60
Glucose: 148
Potassium: 4 mEq/L (ref 3.5–5.1)
Sodium: 139 (ref 137–147)

## 2022-08-24 LAB — COMPREHENSIVE METABOLIC PANEL
Albumin: 3.3 — AB (ref 3.5–5.0)
Calcium: 8.2 — AB (ref 8.7–10.7)

## 2022-08-24 LAB — CBC: RBC: 3.41 — AB (ref 3.87–5.11)

## 2022-08-24 LAB — TSH: TSH: 1.955 u[IU]/mL (ref 0.350–4.500)

## 2022-08-24 LAB — CBC AND DIFFERENTIAL
HCT: 30 — AB (ref 41–53)
Hemoglobin: 9.6 — AB (ref 13.5–17.5)
Neutrophils Absolute: 8.24
Platelets: 214 10*3/uL (ref 150–400)
WBC: 68.7

## 2022-08-24 MED ORDER — SODIUM CHLORIDE 0.9% FLUSH
10.0000 mL | INTRAVENOUS | Status: DC | PRN
  Administered 2022-08-24: 10 mL

## 2022-08-24 MED ORDER — HEPARIN SOD (PORK) LOCK FLUSH 100 UNIT/ML IV SOLN
500.0000 [IU] | Freq: Once | INTRAVENOUS | Status: AC | PRN
  Administered 2022-08-24: 500 [IU]

## 2022-08-24 NOTE — Telephone Encounter (Signed)
CRITICAL VALUE STICKER  CRITICAL VALUE: WBC  68.7  RECEIVER (on-site recipient of call): Rendi Mapel,RN  DATE & TIME NOTIFIED: 08/24/22 @ 1547  MESSENGER (representative from lab): Durward Mallard  MD NOTIFIED: Gilman Buttner  TIME OF NOTIFICATION: 1600  RESPONSE: No new orders.

## 2022-08-26 ENCOUNTER — Inpatient Hospital Stay: Payer: Medicare Other

## 2022-08-26 VITALS — BP 106/58 | HR 70 | Temp 98.6°F | Resp 18 | Ht 70.0 in | Wt 155.0 lb

## 2022-08-26 DIAGNOSIS — C4491 Basal cell carcinoma of skin, unspecified: Secondary | ICD-10-CM

## 2022-08-26 DIAGNOSIS — C44310 Basal cell carcinoma of skin of unspecified parts of face: Secondary | ICD-10-CM

## 2022-08-26 DIAGNOSIS — Z5112 Encounter for antineoplastic immunotherapy: Secondary | ICD-10-CM | POA: Diagnosis not present

## 2022-08-26 LAB — T4: T4, Total: 9.2 ug/dL (ref 4.5–12.0)

## 2022-08-26 MED ORDER — SODIUM CHLORIDE 0.9 % IV SOLN: Freq: Once | INTRAVENOUS | Status: AC

## 2022-08-26 MED ORDER — SODIUM CHLORIDE 0.9 % IV SOLN
350.0000 mg | Freq: Once | INTRAVENOUS | Status: AC
  Administered 2022-08-26: 350 mg via INTRAVENOUS
  Filled 2022-08-26: qty 7

## 2022-08-26 NOTE — Patient Instructions (Signed)
Midodrine Tablets What is this medication? MIDODRINE (MI doe dreen) reduces the symptoms of low blood pressure caused by a changing position or standing. It works by narrowing your blood vessels, which helps increase your blood pressure. This reduces episodes of dizziness, lightheadedness, or fainting. This medicine may be used for other purposes; ask your health care provider or pharmacist if you have questions. COMMON BRAND NAME(S): Orvaten, ProAmatine What should I tell my care team before I take this medication? They need to know if you have any of the following conditions: Difficulty passing urine Heart disease High blood pressure Kidney disease Overactive thyroid Pheochromocytoma An unusual or allergic reaction to midodrine, other medications, foods, dyes, or preservatives Pregnant or trying to get pregnant Breastfeeding How should I use this medication? Take this medication by mouth with water. Take it as directed on the prescription label at the same time every day. Do not take it after the evening meal, within 3 to 4 hours of bedtime, or while lying down. Keep taking it unless your care team tells you to stop. Talk to your care team about the use of this medication in children. Special care may be needed. Overdosage: If you think you have taken too much of this medicine contact a poison control center or emergency room at once. NOTE: This medicine is only for you. Do not share this medicine with others. What if I miss a dose? If you miss a dose, take it as soon as you can. If it is almost time for your next dose, take only that dose. Do not take double or extra doses. What may interact with this medication? Do not take this medication with any of the following: MAOIs, such as Marplan, Nardil, and Parnate Ergot alkaloids Medications for colds and breathing difficulties or weight loss Procarbazine This medication may also interact with the  following: Cimetidine Digoxin Flecainide Fludrocortisone Medications called alpha blockers, such as doxazosin, prazosin, and terazosin Metformin Procainamide Quinidine Ranitidine Triamterene This list may not describe all possible interactions. Give your health care provider a list of all the medicines, herbs, non-prescription drugs, or dietary supplements you use. Also tell them if you smoke, drink alcohol, or use illegal drugs. Some items may interact with your medicine. What should I watch for while using this medication? Visit your care team for regular checks on your progress. Tell your care team if your symptoms do not start to get better or if they get worse. This medication may affect your coordination, reaction time, or judgment. Do not drive or operate machinery until you know how this medication affects you. Sit up or stand slowly to reduce the risk of dizzy or fainting spells. Drinking alcohol with this medication can increase the risk of these side effects. Your mouth may get dry. Chewing sugarless gum or sucking hard candy, and drinking plenty of water may help. Contact your care team if the problem does not go away or is severe. Do not treat yourself for coughs, colds, or pain while you are using this medication without asking your care team for advice. Some medications may increase your blood pressure. What side effects may I notice from receiving this medication? Side effects that you should report to your care team as soon as possible: Allergic reactions--skin rash, itching, hives, swelling of the face, lips, tongue, or throat Increase in blood pressure Slow heartbeat--dizziness, feeling faint or lightheaded, confusion, trouble breathing, unusual weakness or fatigue Trouble passing urine Side effects that usually do not require medical attention (  report to your care team if they continue or are bothersome): Burning or tingling sensation in hands or feet Chills Increased  need to urinate This list may not describe all possible side effects. Call your doctor for medical advice about side effects. You may report side effects to FDA at 1-800-FDA-1088. Where should I keep my medication? Keep out of the reach of children and pets. Store between 15 and 30 degrees C (59 and 86 degrees F). Get rid of any unused medication after the expiration date. To get rid of medications that are no longer needed or have expired: Take the medication to a medication take-back program. Check with your pharmacy or law enforcement to find a location. If you cannot return the medication, check the label or package insert to see if the medication should be thrown out in the garbage or flushed down the toilet. If you are not sure, ask your care team. If it is safe to put it in the trash, empty the medication out of the container. Mix the medication with cat litter, dirt, coffee grounds, or other unwanted substance. Seal the mixture in a bag or container. Put it in the trash. NOTE: This sheet is a summary. It may not cover all possible information. If you have questions about this medicine, talk to your doctor, pharmacist, or health care provider.  2023 Elsevier/Gold Standard (2021-05-28 00:00:00)

## 2022-08-27 ENCOUNTER — Other Ambulatory Visit: Payer: Self-pay

## 2022-09-06 ENCOUNTER — Encounter: Payer: Self-pay | Admitting: Oncology

## 2022-09-07 ENCOUNTER — Encounter: Payer: Self-pay | Admitting: Oncology

## 2022-09-07 ENCOUNTER — Other Ambulatory Visit: Payer: Self-pay | Admitting: Oncology

## 2022-09-07 ENCOUNTER — Encounter: Payer: Self-pay | Admitting: Hematology and Oncology

## 2022-09-07 DIAGNOSIS — D500 Iron deficiency anemia secondary to blood loss (chronic): Secondary | ICD-10-CM

## 2022-09-07 DIAGNOSIS — C44310 Basal cell carcinoma of skin of unspecified parts of face: Secondary | ICD-10-CM

## 2022-09-07 DIAGNOSIS — C4491 Basal cell carcinoma of skin, unspecified: Secondary | ICD-10-CM

## 2022-09-13 NOTE — Progress Notes (Signed)
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  He received 8 cycles with improvement in his skin lesions.  Unfortunately, he has had progressive worsening fatigue and debility and so we stopped his infusions but have now resumed them as of February, 2024.  The lesions of his skin fluctuate but overall are improved.   CLL (chronic lymphocytic leukemia) (HCC) We held off on treatment of his CLL due to multiple medical problems.  His skin cancer is now being treated with cemiplimab and he is tolerating this well.  He has worsening anemia, now requiring transfusion.  This is likely due to his CLL, as he received IV iron recently. He started acalabrutinib 100 mg twice daily for his CLL on July 21st. He has had an increase in his white blood cell count since starting acalabrutinib.  An intial increase in the lymphocytosis is common with acalabrutinib.  His white blood cell count has decreased somewhat from 222,000 to 212,000.  He has persistent anemia without definite nutritional deficiency, so this was felt to be due to his CLL.  He then reported black stool.  He was started on pantoprazole 40 mg daily.  He had worsening diarrhea and stool studies revealed enteropathogenic E. Coli, which was treated with ciprofloxacin.   He had persistent melana and stool Hemoccults x3 were positive, so alcalabrutinib was discontinued on September 15th, 2023.  His white blood cell count decreased to  109,900 and his hemoglobin improved to 9.2 on September 21. His WBC came down to 15,000 in November, 2023. Since stopping treatment, his white count is rising again to 49,000, then 57,000 in January, 2024, 58,000 in February, and 68,000 in March. His WBC has leveled off for the last two checkups.    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 He was transfused in August of 2023, when his hemoglobin dropped to 8 and he was very symptomatic. He last received IV iron replacement in June.  He required transfusion in August when his hemoglobin dropped to 8 and he was symptomatic. His hemoglobin has been better since December. It was up to 10.4 in February and went down to 9.4 it has now been 9.6 for the last two checks.    Plan:  He stopped acalabrutinib for his CLL in September, 2023 and he is on observation only for his leukemia. He has an appointment with his dermatologist on 09/20/2022. His daughter informed me that he has been scheduled for an echocardiogram and there has been discussion about a cardiac catheterization. His daughter agrees with a echocardiogram but is hesitant of the cardiac catheterization although he wants it. I agree with his daughter and explained to him that he is a high risk. His day 1 cycle 4 of Libtayo is scheduled on 09/16/2022. His WBC is 79.2, up from 69 with 89.6% lymphocytes, hemoglobin 9.6, and platelet count is 260,000 today. His CMP is normal today. He will see Harvin Hazel on June 5th with labs. The patient and  his daughter understand the plans discussed today and are in agreement with them.  They know to contact our office if he develops concerns prior to his next appointment.  I provided 20 minutes of face-to-face time during this this encounter and > 50% was spent counseling as documented under my assessment and plan.   Dellia Beckwith, MD  [facility] AT [facility] [address] [place]  [address] Dept: [phone] Dept Fax: [phone]   No orders of the defined types were placed in this encounter.    CHIEF COMPLAINT:  CC:  Squamous cell skin cancers, CLL  Current Treatment: Libtayo  HISTORY OF PRESENT ILLNESS:   Oncology History  CLL (chronic lymphocytic leukemia) (HCC)  05/25/2015 Initial Diagnosis   CLL (chronic lymphocytic leukemia) (HCC)   02/04/2021 Cancer Staging   Staging form: Chronic Lymphocytic Leukemia / Small Lymphocytic Lymphoma, AJCC 8th Edition - Clinical stage from 02/04/2021: Modified Rai Stage III (Modified Rai risk: High, Binet: Stage A, Lymphocytosis: Present, Adenopathy: Absent, Organomegaly: Absent, Anemia: Present, Thrombocytopenia: Absent) - Signed by Dellia Beckwith, MD on 02/08/2021 Stage prefix: Recurrence Absolute lymphocyte count (ALC) (cells/uL): 100 Hemoglobin (Hgb) (g/dL): 13.0 Platelet count (x10E9/L of blood): 184   Cancer of the skin, basal cell  05/06/2021 Initial Diagnosis   Cancer of the skin, basal cell   08/30/2021 - 12/31/2021 Chemotherapy   Patient is on Treatment Plan : SQUAMOUS SKIN Cemiplimab q21d     08/30/2021 - 01/21/2022 Chemotherapy   Patient is on Treatment Plan : ADVANCED CUTANEOUS SQUAMOUS CELL CARCINOMA Cemiplimab q21d     07/15/2022 -  Chemotherapy   Patient is on Treatment Plan : ADVANCED CUTANEOUS SQUAMOUS CELL CARCINOMA Cemiplimab q21d     BCC (basal cell carcinoma), face  12/28/2016 Initial Diagnosis   BCC (basal cell carcinoma), face   07/15/2022 -  Chemotherapy   Patient is on Treatment Plan : ADVANCED CUTANEOUS SQUAMOUS CELL CARCINOMA Cemiplimab q21d        INTERVAL HISTORY:  Fernando Young is here today for repeat clinical assessment after stopping acalabrutinib for his CLL in September, 2023. Patient states that he feels weak and can't do much walking but has no complaints of pain. He has an appointment with his dermatologist on 09/20/2022. His daughter informed me that he has been scheduled for an  echocardiogram and there has been discussion about a cardiac catheterization. His daughter agrees with a echocardiogram but is hesitant of the cardiac catheterization although he wants it. I agree with his daughter and explained to him that he is a high risk. His day 1 cycle 4 of Libtayo is scheduled on 09/16/2022. His WBC is 79.2, up from 69 with 89.6% lymphocytes, hemoglobin 9.6, and platelet count is 260,000 today. His CMP is normal today. He will see Harvin Hazel on June 5th with labs. He denies signs of infection such as sore throat, sinus drainage, cough, or urinary symptoms.  He denies fevers or recurrent chills. He denies pain. He denies nausea, vomiting, chest pain, dyspnea or cough. His appetite is good and his weight has decreased 2 pounds over last 3 weeks . He is accompanied by his daughter at today's visit as always.   REVIEW OF SYSTEMS:  Review of Systems  Constitutional:  Positive for fatigue. Negative for appetite change, chills, diaphoresis, fever and unexpected weight change.  HENT:  Negative.  Negative for hearing loss, lump/mass, mouth sores, nosebleeds, sore throat, tinnitus, trouble swallowing and voice change.   Eyes: Negative.  Negative for eye problems and icterus.  Respiratory: Negative.  Negative for chest tightness, cough, hemoptysis, shortness of breath and wheezing.   Cardiovascular: Negative.  Negative for chest pain, leg swelling and palpitations.  Gastrointestinal: Negative.  Negative for abdominal distention, abdominal pain, blood in stool, constipation, diarrhea, nausea, rectal pain and vomiting.  Endocrine: Negative.  Negative for hot flashes.  Genitourinary: Negative.  Negative for bladder incontinence, difficulty urinating, dyspareunia, dysuria, frequency, hematuria, nocturia, pelvic pain and penile discharge.   Musculoskeletal:  Negative for arthralgias, back pain, flank pain, gait problem, myalgias, neck pain and neck stiffness.  Skin: Negative.  Negative for itching,  rash and wound.       keratotic skin lesions w/ erythema  Neurological:  Positive for extremity weakness. Negative for dizziness, gait problem, headaches, light-headedness, numbness, seizures and speech difficulty.       Generalized weakness.  Hematological: Negative.  Negative for adenopathy. Does not bruise/bleed easily.  Psychiatric/Behavioral: Negative.  Negative for confusion, decreased concentration, depression, sleep disturbance and suicidal ideas. The patient is not nervous/anxious.      VITALS:  Blood pressure 117/69, pulse 61, temperature 98.1 F (36.7 C), temperature source Oral, resp. rate 18, height 5\' 10"  (1.778 m), weight 153 lb 11.2 oz (69.7 kg), SpO2 100 %.  Wt Readings from Last 3 Encounters:  09/22/22 154 lb 3.5 oz (70 kg)  09/16/22 154 lb (69.9 kg)  09/14/22 153 lb 11.2 oz (69.7 kg)    Body mass index is 22.05 kg/m.  Performance status (ECOG): 2 - Symptomatic, <50% confined to bed  PHYSICAL EXAM:  Physical Exam Vitals and nursing note reviewed. Exam conducted with a chaperone present.  Constitutional:      General: He is not in acute distress.    Appearance: Normal appearance. He is normal weight. He is not ill-appearing, toxic-appearing or diaphoretic.  HENT:     Head: Normocephalic and atraumatic.     Comments: scattered keratoses and skin cancers of his face and ears. The right side of the nose has improved and the hemorrhagic area on the left nose is improved. Both cheeks have scattered lesions in the maxillary area.       Right Ear: Tympanic membrane, ear canal and external ear normal. There is no impacted cerumen.     Left Ear: Tympanic membrane, ear canal and external ear normal. There is no impacted cerumen.     Ears:     Comments: Small area of Keratosis on the right pinna and a large skin cancer on the pinna of the left ear.      Nose: Nose normal. No congestion or rhinorrhea.     Mouth/Throat:     Mouth: Mucous membranes are moist.     Pharynx:  Oropharynx is clear. No oropharyngeal exudate or posterior oropharyngeal erythema.  Eyes:     General: No scleral icterus.       Right eye: No discharge.        Left eye: No discharge.     Extraocular Movements: Extraocular movements intact.     Conjunctiva/sclera: Conjunctivae normal.     Pupils: Pupils are equal, round, and reactive to light.  Neck:     Vascular: No carotid bruit.  Cardiovascular:     Rate and Rhythm: Normal rate and regular rhythm.     Pulses: Normal pulses.     Heart sounds: Murmur heard.     Systolic murmur is present with a grade of 2/6.     No friction rub. No  gallop.  Pulmonary:     Effort: Pulmonary effort is normal. No respiratory distress.     Breath sounds: Normal breath sounds. No stridor. No wheezing, rhonchi or rales.  Chest:     Chest wall: No tenderness.  Abdominal:     General: Bowel sounds are normal. There is no distension.     Palpations: Abdomen is soft. There is no hepatomegaly, splenomegaly or mass.     Tenderness: There is no abdominal tenderness. There is no right CVA tenderness, left CVA tenderness, guarding or rebound.     Hernia: No hernia is present.  Musculoskeletal:        General: No swelling, tenderness, deformity or signs of injury. Normal range of motion.     Cervical back: Normal range of motion and neck supple. No rigidity or tenderness.     Right lower leg: 2+ Edema present.     Left lower leg: 2+ Edema present.     Comments: 1-2+ edema with some reaction to the anterior tibia.  Scattered skin cancers    Lymphadenopathy:     Cervical: No cervical adenopathy.     Upper Body:     Right upper body: No supraclavicular or axillary adenopathy.     Left upper body: No supraclavicular or axillary adenopathy.  Skin:    General: Skin is warm and dry.     Coloration: Skin is not jaundiced or pale.     Findings: Lesion present. No bruising, erythema or rash.     Comments: Multiple areas of erythema of the skin on the scalp, face,  ears, and dorsum of the left hand, anterior tibia associate with keratotic lesions and probable skin cancers.    Neurological:     General: No focal deficit present.     Mental Status: He is alert and oriented to person, place, and time. Mental status is at baseline.     Cranial Nerves: No cranial nerve deficit.     Sensory: No sensory deficit.     Motor: No weakness.     Coordination: Coordination normal.     Gait: Gait normal.     Deep Tendon Reflexes: Reflexes normal.  Psychiatric:        Mood and Affect: Mood normal.        Behavior: Behavior normal.        Thought Content: Thought content normal.        Judgment: Judgment normal.     LABS:      Latest Ref Rng & Units 09/14/2022   12:00 AM 08/24/2022   12:00 AM 08/02/2022   12:00 AM  CBC  WBC  79.2     68.7     70.3      Hemoglobin 13.5 - 17.5 9.6     9.6     9.4      Hematocrit 41 - 53 31     30     30       Platelets 150 - 400 K/uL 260     214     223         This result is from an external source.      Latest Ref Rng & Units 09/14/2022   12:00 AM 08/24/2022   12:00 AM 08/02/2022   12:00 AM  CMP  BUN 4 - 21 20     24     22       Creatinine 0.6 - 1.3 1.0     1.0  0.9      Sodium 137 - 147 139     139     137      Potassium 3.5 - 5.1 mEq/L 4.2     4.0     4.1      Chloride 99 - 108 108     106     106      CO2 13 - 22 27     26     25       Calcium 8.7 - 10.7 8.4     8.2     8.3      Alkaline Phos 25 - 125 65     65     51      AST 14 - 40 23     27     30       ALT 10 - 40 U/L 20     22     26          This result is from an external source.            Component Ref Range & Units 2 wk ago (08/24/22) 2 mo ago (07/08/22) 5 mo ago (04/14/22) 6 mo ago (03/03/22) 7 mo ago (02/10/22) 7 mo ago (01/20/22) 8 mo ago (12/30/21)  TSH 0.350 - 4.500 uIU/mL 1.955 1.748 CM 2.417 CM 2.615 CM 1.543 CM 1.544 CM 1.677 CM     Component Ref Range & Units 2 wk ago 2 mo ago 5 mo ago 6 mo ago 7 mo ago 8 mo ago 9 mo ago  T4,  Total 4.5 - 12.0 ug/dL 9.2 8.0 CM 8.7 CM 8.1 CM 8.3 CM 9.4 CM 9.4 CM     No results found for: "CEA1", "CEA" / No results found for: "CEA1", "CEA" No results found for: "PSA1" No results found for: "ZOX096" No results found for: "CAN125"  No results found for: "TOTALPROTELP", "ALBUMINELP", "A1GS", "A2GS", "BETS", "BETA2SER", "GAMS", "MSPIKE", "SPEI" Lab Results  Component Value Date   TIBC 301 09/14/2022   TIBC 267 12/31/2021   TIBC 291 09/15/2021   FERRITIN 59 09/14/2022   FERRITIN 248 12/31/2021   FERRITIN 48 09/15/2021   IRONPCTSAT 13 (L) 09/14/2022   IRONPCTSAT 22 12/31/2021   IRONPCTSAT 13 (L) 09/15/2021   Lab Results  Component Value Date   LDH 166 08/03/2020    STUDIES:  No results found.    HISTORY:   Past Medical History:  Diagnosis Date   B12 deficiency    Cancer of the skin, 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   CHF (congestive heart failure) (HCC)    Diabetes mellitus without complication (HCC)    History of prostate cancer    Hyperlipidemia    Hypertension    Hypoglycemia 05/06/2021   Iron deficiency anemia 02/20/2020   Leucocytosis    Leukemia, lymphocytic, chronic (HCC)     Past Surgical History:  Procedure Laterality Date   PROSTATE BIOPSY      Family History  Problem Relation Age of Onset   Ovarian cancer Mother 4   Cancer Sister 47    Social History:  reports that he has quit smoking. He has never used smokeless tobacco. He reports that he does not currently use alcohol. He reports that he does not use drugs.The patient is accompanied by his daughter today.  Allergies: No Known Allergies  Current Medications: Current Outpatient Medications  Medication Sig Dispense Refill   acetaminophen (TYLENOL) 500 MG tablet Take 1,000 mg by mouth every  6 (six) hours as needed for mild pain, moderate pain or fever.     amiodarone (PACERONE) 200 MG tablet Take 200 mg by mouth daily.     apixaban (ELIQUIS) 5 MG TABS tablet Take 1 tablet by mouth 2 (two) times  daily.     Ascorbic Acid (VITAMIN C) 500 MG CAPS Take 500 mg by mouth daily.     bismuth subsalicylate (PEPTO BISMOL) 262 MG/15ML suspension Take 30 mLs by mouth every 6 (six) hours as needed. For constipation     cholecalciferol (VITAMIN D3) 25 MCG (1000 UNIT) tablet Take 1,000 Units by mouth daily.     furosemide (LASIX) 40 MG tablet Take 0.5 tablets (20 mg total) by mouth daily. 30 tablet 0   ketoconazole (NIZORAL) 2 % shampoo Apply topically 3 (three) times a week.     levothyroxine (SYNTHROID) 75 MCG tablet Take 75 mcg by mouth daily before breakfast.     metFORMIN (GLUCOPHAGE-XR) 500 MG 24 hr tablet Take 500 mg by mouth 2 (two) times daily.     midodrine (PROAMATINE) 5 MG tablet Take 1 tablet (5 mg total) by mouth 3 (three) times daily with meals. 90 tablet 3   ofloxacin (OCUFLOX) 0.3 % ophthalmic solution 1 drop 4 (four) times daily.     Omega-3 Fatty Acids (FISH OIL) 1000 MG CAPS Take 1,000 mg by mouth daily.     ondansetron (ZOFRAN) 4 MG tablet Take 1 tablet (4 mg total) by mouth every 6 (six) hours as needed for nausea. 20 tablet 0   pantoprazole (PROTONIX) 40 MG tablet TAKE 1 TABLET BY MOUTH EVERY DAY 90 tablet 1   polyethylene glycol (MIRALAX / GLYCOLAX) 17 g packet Take 17 g by mouth daily as needed for mild constipation. 14 each 0   rOPINIRole (REQUIP) 0.5 MG tablet Take 0.5-1 mg by mouth See admin instructions. Taking one tablet at bedtime, if no relief take an additional tablet     rosuvastatin (CRESTOR) 20 MG tablet Take 10 mg by mouth daily.     vitamin B-12 (CYANOCOBALAMIN) 500 MCG tablet Take 500 mcg by mouth daily.     Current Facility-Administered Medications  Medication Dose Route Frequency Provider Last Rate Last Admin   sodium chloride flush (NS) 0.9 % injection 10 mL  10 mL Intracatheter PRN Mosher, Harvin Hazel A, PA-C   10 mL at 09/14/22 1554     I,Jasmine M Lassiter,acting as a scribe for Dellia Beckwith, MD.,have documented all relevant documentation on the behalf  of Dellia Beckwith, MD,as directed by  Dellia Beckwith, MD while in the presence of Dellia Beckwith, MD.

## 2022-09-14 ENCOUNTER — Inpatient Hospital Stay (INDEPENDENT_AMBULATORY_CARE_PROVIDER_SITE_OTHER): Payer: Medicare Other | Admitting: Oncology

## 2022-09-14 ENCOUNTER — Inpatient Hospital Stay: Payer: Medicare Other | Attending: Oncology

## 2022-09-14 ENCOUNTER — Encounter: Payer: Self-pay | Admitting: Oncology

## 2022-09-14 VITALS — BP 117/69 | HR 61 | Temp 98.1°F | Resp 18 | Ht 70.0 in | Wt 153.7 lb

## 2022-09-14 DIAGNOSIS — C44310 Basal cell carcinoma of skin of unspecified parts of face: Secondary | ICD-10-CM | POA: Diagnosis not present

## 2022-09-14 DIAGNOSIS — C9110 Chronic lymphocytic leukemia of B-cell type not having achieved remission: Secondary | ICD-10-CM | POA: Diagnosis not present

## 2022-09-14 DIAGNOSIS — Z452 Encounter for adjustment and management of vascular access device: Secondary | ICD-10-CM | POA: Insufficient documentation

## 2022-09-14 DIAGNOSIS — Z5112 Encounter for antineoplastic immunotherapy: Secondary | ICD-10-CM | POA: Insufficient documentation

## 2022-09-14 DIAGNOSIS — D509 Iron deficiency anemia, unspecified: Secondary | ICD-10-CM

## 2022-09-14 DIAGNOSIS — D500 Iron deficiency anemia secondary to blood loss (chronic): Secondary | ICD-10-CM

## 2022-09-14 DIAGNOSIS — C4491 Basal cell carcinoma of skin, unspecified: Secondary | ICD-10-CM | POA: Diagnosis not present

## 2022-09-14 DIAGNOSIS — C4440 Unspecified malignant neoplasm of skin of scalp and neck: Secondary | ICD-10-CM | POA: Diagnosis not present

## 2022-09-14 LAB — CBC AND DIFFERENTIAL
HCT: 31 — AB (ref 41–53)
Hemoglobin: 9.6 — AB (ref 13.5–17.5)
Neutrophils Absolute: 6.34
Platelets: 260 10*3/uL (ref 150–400)
WBC: 79.2

## 2022-09-14 LAB — BASIC METABOLIC PANEL
BUN: 20 (ref 4–21)
CO2: 27 — AB (ref 13–22)
Chloride: 108 (ref 99–108)
Creatinine: 1 (ref 0.6–1.3)
EGFR: 60
Glucose: 125
Potassium: 4.2 mEq/L (ref 3.5–5.1)
Sodium: 139 (ref 137–147)

## 2022-09-14 LAB — COMPREHENSIVE METABOLIC PANEL
Albumin: 3.3 — AB (ref 3.5–5.0)
Calcium: 8.4 — AB (ref 8.7–10.7)

## 2022-09-14 LAB — HEPATIC FUNCTION PANEL
ALT: 20 U/L (ref 10–40)
AST: 23 (ref 14–40)
Alkaline Phosphatase: 65 (ref 25–125)
Bilirubin, Total: 0.2

## 2022-09-14 LAB — IRON AND TIBC
Iron: 40 ug/dL — ABNORMAL LOW (ref 45–182)
Saturation Ratios: 13 % — ABNORMAL LOW (ref 17.9–39.5)
TIBC: 301 ug/dL (ref 250–450)
UIBC: 261 ug/dL

## 2022-09-14 LAB — FOLATE: Folate: 29 ng/mL (ref >5.9)

## 2022-09-14 LAB — VITAMIN B12: Vitamin B-12: 547 pg/mL (ref 180–914)

## 2022-09-14 LAB — FERRITIN: Ferritin: 59 ng/mL (ref 24–336)

## 2022-09-14 LAB — CBC: RBC: 3.46 — AB (ref 3.87–5.11)

## 2022-09-14 MED ORDER — SODIUM CHLORIDE 0.9% FLUSH
10.0000 mL | INTRAVENOUS | Status: DC | PRN
  Administered 2022-09-14: 10 mL

## 2022-09-14 MED ORDER — HEPARIN SOD (PORK) LOCK FLUSH 100 UNIT/ML IV SOLN
500.0000 [IU] | Freq: Once | INTRAVENOUS | Status: AC | PRN
  Administered 2022-09-14: 500 [IU]

## 2022-09-14 NOTE — Progress Notes (Signed)
CRITICAL VALUE STICKER  CRITICAL VALUE:  WBC 79.2  RECEIVER (on-site recipient of call):  Dyane Dustman RN  DATE & TIME NOTIFIED:   09/14/2022 @ 1619  MESSENGER (representative from lab):  Morrie Sheldon [facility]  MD NOTIFIED:   Dr. Gilman Buttner  TIME OF NOTIFICATION:  1630  RESPONSE:  Patient scheduled to see patient today.

## 2022-09-15 ENCOUNTER — Other Ambulatory Visit: Payer: Self-pay

## 2022-09-15 DIAGNOSIS — C44310 Basal cell carcinoma of skin of unspecified parts of face: Secondary | ICD-10-CM

## 2022-09-15 DIAGNOSIS — C4491 Basal cell carcinoma of skin, unspecified: Secondary | ICD-10-CM

## 2022-09-16 ENCOUNTER — Telehealth: Payer: Medicare Other

## 2022-09-16 ENCOUNTER — Encounter: Payer: Self-pay | Admitting: Hematology and Oncology

## 2022-09-16 ENCOUNTER — Inpatient Hospital Stay: Payer: Medicare Other

## 2022-09-16 VITALS — BP 124/63 | HR 64 | Temp 97.8°F | Resp 12 | Ht 70.0 in | Wt 154.0 lb

## 2022-09-16 DIAGNOSIS — Z5112 Encounter for antineoplastic immunotherapy: Secondary | ICD-10-CM | POA: Diagnosis not present

## 2022-09-16 DIAGNOSIS — C4491 Basal cell carcinoma of skin, unspecified: Secondary | ICD-10-CM

## 2022-09-16 DIAGNOSIS — C44310 Basal cell carcinoma of skin of unspecified parts of face: Secondary | ICD-10-CM

## 2022-09-16 LAB — SOLUBLE TRANSFERRIN RECEPTOR: Transferrin Receptor: 18.4 nmol/L (ref 12.2–27.3)

## 2022-09-16 MED ORDER — SODIUM CHLORIDE 0.9 % IV SOLN: Freq: Once | INTRAVENOUS | Status: AC

## 2022-09-16 MED ORDER — HEPARIN SOD (PORK) LOCK FLUSH 100 UNIT/ML IV SOLN
500.0000 [IU] | Freq: Once | INTRAVENOUS | Status: AC | PRN
  Administered 2022-09-16: 500 [IU]

## 2022-09-16 MED ORDER — SODIUM CHLORIDE 0.9% FLUSH
10.0000 mL | INTRAVENOUS | Status: DC | PRN
  Administered 2022-09-16: 10 mL

## 2022-09-16 MED ORDER — SODIUM CHLORIDE 0.9 % IV SOLN
350.0000 mg | Freq: Once | INTRAVENOUS | Status: AC
  Administered 2022-09-16: 350 mg via INTRAVENOUS
  Filled 2022-09-16: qty 7

## 2022-09-16 NOTE — Telephone Encounter (Signed)
-----   Message from Dellia Beckwith, MD sent at 09/16/2022  8:54 AM EDT ----- Regarding: call Tell him/daughter that B12 & folate good but iron is low again.  I rec. we repeat IV iron

## 2022-09-16 NOTE — Patient Instructions (Signed)
Cemiplimab Injection What is this medication? CEMIPLIMAB (se MIP li mab) treats skin cancer and lung cancer. It works by helping your immune system slow or stop the spread of cancer cells. It is a monoclonal antibody. This medicine may be used for other purposes; ask your health care provider or pharmacist if you have questions. COMMON BRAND NAME(S): LIBTAYO What should I tell my care team before I take this medication? They need to know if you have any of these conditions: Allogeneic stem cell transplant (uses someone else's stem cells) Autoimmune diseases, such as Crohn's disease, ulcerative colitis, or lupus Diabetes Nervous system problems, such as myasthenia gravis or Guillain-Barre syndrome Organ transplant Recent or ongoing radiation Thyroid disease An unusual or allergic reaction to cemiplimab, other medications, foods, dyes, or preservatives Pregnant or trying to get pregnant Breast-feeding How should I use this medication? This medication is infused into a vein. It is given by your care team in a hospital or clinic setting. A special MedGuide will be given to you before each treatment. Be sure to read this information carefully each time. Talk to your care team about the use of this medication in children. Special care may be needed. Overdosage: If you think you have taken too much of this medicine contact a poison control center or emergency room at once. NOTE: This medicine is only for you. Do not share this medicine with others. What if I miss a dose? Keep appointments for follow-up doses. It is important not to miss your dose. Call your care team if you are unable to keep an appointment. What may interact with this medication? Interactions have not been studied. This list may not describe all possible interactions. Give your health care provider a list of all the medicines, herbs, non-prescription drugs, or dietary supplements you use. Also tell them if you smoke, drink  alcohol, or use illegal drugs. Some items may interact with your medicine. What should I watch for while using this medication? This medication may make you feel generally unwell. This is not uncommon as chemotherapy can affect healthy cells as well as cancer cells. Report any side effects. Continue your course of treatment even though you feel ill until your care team tells you to stop. You may need blood work done while you are taking this medication. This medication may cause serious skin reactions. They can happen in the weeks to months after starting the medication. Contact your care team right away if you notice fevers or flu-like symptoms with a rash. The rash may be red or purple and then turn into blisters or peeling of the skin. You may also notice a red rash with swelling of the face, lips, or lymph nodes in your neck or under your arms. Tell your care team right away if you have any changes in your vision. This medication may increase blood sugar. The risk may be higher in patients who already have diabetes. Ask your care team what you can do to lower your risk of diabetes while taking this medication. Talk to your care team if you wish to become pregnant or think you might be pregnant. This medication can cause serious birth defects if taken during pregnancy. A negative pregnancy test is required before starting this medication. A reliable form of contraception is recommended while taking this medication and for at least 4 months after stopping it. Do not breast-feed while taking this medication and for at least 4 months after stopping it. What side effects may I notice   from receiving this medication? Side effects that you should report to your care team as soon as possible: Allergic reactions--skin rash, itching, hives, swelling of the face, lips, tongue, or throat Dry cough, shortness of breath or trouble breathing Eye pain, redness, irritation, or discharge with blurry or decreased  vision Heart muscle inflammation--unusual weakness or fatigue, shortness of breath, chest pain, fast or irregular heartbeat, dizziness, swelling of the ankles, feet, or hands Hormone gland problems--headache, sensitivity to light, unusual weakness or fatigue, dizziness, fast or irregular heartbeat, increased sensitivity to cold or heat, excessive sweating, constipation, hair loss, increased thirst or amount of urine, tremors or shaking, irritability Infusion reactions--chest pain, shortness of breath or trouble breathing, feeling faint or lightheaded Kidney injury (glomerulonephritis)--decrease in the amount of urine, red or dark brown urine, foamy or bubbly urine, swelling of the ankles, hands, or feet Liver injury--right upper belly pain, loss of appetite, nausea, light-colored stool, dark yellow or brown urine, yellowing skin or eyes, unusual weakness or fatigue Pain, tingling, or numbness in the hands or feet, muscle weakness, change in vision, confusion or trouble speaking, loss of balance or coordination, trouble walking, seizures Rash, fever, and swollen lymph nodes Redness, blistering, peeling, or loosening of the skin, including inside the mouth Sudden or severe stomach pain, bloody diarrhea, fever, nausea, vomiting Side effects that usually do not require medical attention (report these to your care team if they continue or are bothersome): Bone, joint, or muscle pain Diarrhea Fatigue Loss of appetite Nausea Skin rash This list may not describe all possible side effects. Call your doctor for medical advice about side effects. You may report side effects to FDA at 1-800-FDA-1088. Where should I keep my medication? This medication is given in a hospital or clinic and will not be stored at home. NOTE: This sheet is a summary. It may not cover all possible information. If you have questions about this medicine, talk to your doctor, pharmacist, or health care provider.  2023 Elsevier/Gold  Standard (2021-03-19 00:00:00)  

## 2022-09-16 NOTE — Telephone Encounter (Signed)
Patients daughter aware of iron studies. Daughter in agreement with IV iron/

## 2022-09-19 ENCOUNTER — Encounter: Payer: Self-pay | Admitting: Hematology and Oncology

## 2022-09-19 ENCOUNTER — Encounter: Payer: Self-pay | Admitting: Oncology

## 2022-09-21 ENCOUNTER — Encounter: Payer: Self-pay | Admitting: Hematology and Oncology

## 2022-09-21 MED FILL — Ferumoxytol Inj 510 MG/17ML (30 MG/ML) (Elemental Fe): INTRAVENOUS | Qty: 17 | Status: AC

## 2022-09-22 ENCOUNTER — Inpatient Hospital Stay: Payer: Medicare Other

## 2022-09-22 VITALS — BP 109/73 | HR 69 | Temp 98.1°F | Resp 18 | Ht 70.0 in | Wt 154.2 lb

## 2022-09-22 DIAGNOSIS — Z5112 Encounter for antineoplastic immunotherapy: Secondary | ICD-10-CM | POA: Diagnosis not present

## 2022-09-22 DIAGNOSIS — D509 Iron deficiency anemia, unspecified: Secondary | ICD-10-CM

## 2022-09-22 MED ORDER — SODIUM CHLORIDE 0.9 % IV SOLN: Freq: Once | INTRAVENOUS | Status: AC

## 2022-09-22 MED ORDER — HEPARIN SOD (PORK) LOCK FLUSH 100 UNIT/ML IV SOLN
500.0000 [IU] | Freq: Once | INTRAVENOUS | Status: AC | PRN
  Administered 2022-09-22: 500 [IU]

## 2022-09-22 MED ORDER — SODIUM CHLORIDE 0.9 % IV SOLN
510.0000 mg | Freq: Once | INTRAVENOUS | Status: AC
  Administered 2022-09-22: 510 mg via INTRAVENOUS
  Filled 2022-09-22: qty 510

## 2022-09-22 MED ORDER — SODIUM CHLORIDE 0.9% FLUSH
10.0000 mL | Freq: Once | INTRAVENOUS | Status: AC | PRN
  Administered 2022-09-22: 10 mL

## 2022-09-22 NOTE — Patient Instructions (Signed)

## 2022-09-23 ENCOUNTER — Telehealth: Payer: Self-pay

## 2022-09-23 NOTE — Telephone Encounter (Signed)
-----   Message from Dellia Beckwith, MD sent at 09/23/2022  1:17 PM EDT ----- Regarding: RE: call Can we call and check on how he's doing? ----- Message ----- From: Domenic Schwab, [facility] Sent: 09/22/2022   5:04 PM EDT To: Dellia Beckwith, MD Subject: RE: call                                       I received a call from his daughter this afternoon.  He received his 1st dose of feraheme today and apparently went home and started feeling bad.  She couldn't tell me exactly what was wrong.  I told her that I didn't feel like it was a reaction to the feraheme because he has received it 6 times before.  I told her that the fluid that we have to give with the feraheme could have aggravated his CHF maybe.  Vitals were stable today while he was here.  I told her if he develops SOB, swelling or rash, that he should be taken to ED.   ----- Message ----- From: Dellia Beckwith, MD Sent: 09/16/2022   8:55 AM EDT To: Domenic Schwab, RPH; Jeannette Corpus, LPN; # Subject: call                                           Tell him/daughter that B12 & folate good but iron is low again.  I rec. we repeat IV iron

## 2022-09-23 NOTE — Telephone Encounter (Signed)
Daughter states he was doing better. Unsure of what happened yesterday.

## 2022-09-27 ENCOUNTER — Encounter: Payer: Self-pay | Admitting: Oncology

## 2022-09-27 ENCOUNTER — Encounter: Payer: Self-pay | Admitting: Hematology and Oncology

## 2022-09-28 ENCOUNTER — Encounter: Payer: Self-pay | Admitting: Oncology

## 2022-09-28 ENCOUNTER — Encounter: Payer: Self-pay | Admitting: Hematology and Oncology

## 2022-09-28 MED FILL — Ferumoxytol Inj 510 MG/17ML (30 MG/ML) (Elemental Fe): INTRAVENOUS | Qty: 17 | Status: AC

## 2022-09-29 ENCOUNTER — Encounter: Payer: Self-pay | Admitting: Oncology

## 2022-09-29 ENCOUNTER — Inpatient Hospital Stay: Payer: Medicare Other

## 2022-09-29 VITALS — BP 120/70 | HR 70 | Temp 98.6°F | Resp 18

## 2022-09-29 DIAGNOSIS — Z5112 Encounter for antineoplastic immunotherapy: Secondary | ICD-10-CM | POA: Diagnosis not present

## 2022-09-29 DIAGNOSIS — D509 Iron deficiency anemia, unspecified: Secondary | ICD-10-CM

## 2022-09-29 DIAGNOSIS — C44310 Basal cell carcinoma of skin of unspecified parts of face: Secondary | ICD-10-CM

## 2022-09-29 DIAGNOSIS — C4491 Basal cell carcinoma of skin, unspecified: Secondary | ICD-10-CM

## 2022-09-29 MED ORDER — HEPARIN SOD (PORK) LOCK FLUSH 100 UNIT/ML IV SOLN
500.0000 [IU] | Freq: Once | INTRAVENOUS | Status: AC | PRN
  Administered 2022-09-29: 500 [IU]

## 2022-09-29 MED ORDER — SODIUM CHLORIDE 0.9 % IV SOLN: Freq: Once | INTRAVENOUS | Status: AC

## 2022-09-29 MED ORDER — SODIUM CHLORIDE 0.9 % IV SOLN
510.0000 mg | Freq: Once | INTRAVENOUS | Status: AC
  Administered 2022-09-29: 510 mg via INTRAVENOUS
  Filled 2022-09-29: qty 510

## 2022-09-29 MED ORDER — SODIUM CHLORIDE 0.9 % IV SOLN: Freq: Once | INTRAVENOUS | Status: DC

## 2022-09-29 MED ORDER — SODIUM CHLORIDE 0.9 % IV SOLN: 350.0000 mg | Freq: Once | INTRAVENOUS | Status: DC

## 2022-09-29 MED ORDER — SODIUM CHLORIDE 0.9% FLUSH
10.0000 mL | Freq: Once | INTRAVENOUS | Status: AC | PRN
  Administered 2022-09-29: 10 mL

## 2022-09-29 MED ORDER — ALTEPLASE 2 MG IJ SOLR: 2.0000 mg | Freq: Once | INTRAMUSCULAR | Status: DC | PRN

## 2022-09-29 NOTE — Patient Instructions (Signed)

## 2022-10-05 ENCOUNTER — Encounter: Payer: Self-pay | Admitting: Hematology and Oncology

## 2022-10-05 ENCOUNTER — Inpatient Hospital Stay: Payer: Medicare Other | Attending: Oncology | Admitting: Hematology and Oncology

## 2022-10-05 ENCOUNTER — Inpatient Hospital Stay: Payer: Medicare Other

## 2022-10-05 VITALS — BP 103/63 | HR 59 | Temp 98.5°F | Resp 18 | Ht 70.0 in | Wt 152.0 lb

## 2022-10-05 DIAGNOSIS — D630 Anemia in neoplastic disease: Secondary | ICD-10-CM | POA: Insufficient documentation

## 2022-10-05 DIAGNOSIS — I509 Heart failure, unspecified: Secondary | ICD-10-CM | POA: Insufficient documentation

## 2022-10-05 DIAGNOSIS — C4491 Basal cell carcinoma of skin, unspecified: Secondary | ICD-10-CM

## 2022-10-05 DIAGNOSIS — Z79620 Long term (current) use of immunosuppressive biologic: Secondary | ICD-10-CM | POA: Diagnosis not present

## 2022-10-05 DIAGNOSIS — C9110 Chronic lymphocytic leukemia of B-cell type not having achieved remission: Secondary | ICD-10-CM | POA: Insufficient documentation

## 2022-10-05 DIAGNOSIS — E611 Iron deficiency: Secondary | ICD-10-CM | POA: Insufficient documentation

## 2022-10-05 DIAGNOSIS — D509 Iron deficiency anemia, unspecified: Secondary | ICD-10-CM

## 2022-10-05 DIAGNOSIS — C44310 Basal cell carcinoma of skin of unspecified parts of face: Secondary | ICD-10-CM

## 2022-10-05 DIAGNOSIS — C4440 Unspecified malignant neoplasm of skin of scalp and neck: Secondary | ICD-10-CM

## 2022-10-05 DIAGNOSIS — Z5112 Encounter for antineoplastic immunotherapy: Secondary | ICD-10-CM | POA: Insufficient documentation

## 2022-10-05 DIAGNOSIS — E119 Type 2 diabetes mellitus without complications: Secondary | ICD-10-CM | POA: Diagnosis not present

## 2022-10-05 DIAGNOSIS — Z8546 Personal history of malignant neoplasm of prostate: Secondary | ICD-10-CM | POA: Diagnosis not present

## 2022-10-05 DIAGNOSIS — Z452 Encounter for adjustment and management of vascular access device: Secondary | ICD-10-CM | POA: Diagnosis not present

## 2022-10-05 HISTORY — DX: Anemia in neoplastic disease: D63.0

## 2022-10-05 LAB — CMP (CANCER CENTER ONLY)
ALT: 20 U/L (ref 0–44)
AST: 21 U/L (ref 15–41)
Albumin: 3 g/dL — ABNORMAL LOW (ref 3.5–5.0)
Alkaline Phosphatase: 48 U/L (ref 38–126)
Anion gap: 9 (ref 5–15)
BUN: 21 mg/dL (ref 8–23)
CO2: 23 mmol/L (ref 22–32)
Calcium: 8.2 mg/dL — ABNORMAL LOW (ref 8.9–10.3)
Chloride: 107 mmol/L (ref 98–111)
Creatinine: 0.93 mg/dL (ref 0.61–1.24)
GFR, Estimated: >60 mL/min (ref >60)
Glucose, Bld: 109 mg/dL — ABNORMAL HIGH (ref 70–99)
Potassium: 3.6 mmol/L (ref 3.5–5.1)
Sodium: 139 mmol/L (ref 135–145)
Total Bilirubin: 0.4 mg/dL (ref 0.3–1.2)
Total Protein: 5.6 g/dL — ABNORMAL LOW (ref 6.5–8.1)

## 2022-10-05 LAB — CBC AND DIFFERENTIAL
HCT: 31 — AB (ref 41–53)
Hemoglobin: 9.5 — AB (ref 13.5–17.5)
MCV: 92 (ref 80–94)
Neutrophils Absolute: 5.18
Platelets: 208 10*3/uL (ref 150–400)
WBC: 86.3

## 2022-10-05 LAB — CBC: RBC: 3.4 — AB (ref 3.87–5.11)

## 2022-10-05 LAB — CBC W DIFFERENTIAL (~~LOC~~ CC SCANNED REPORT)

## 2022-10-05 MED ORDER — HEPARIN SOD (PORK) LOCK FLUSH 100 UNIT/ML IV SOLN
500.0000 [IU] | Freq: Once | INTRAVENOUS | Status: AC | PRN
  Administered 2022-10-05: 500 [IU]

## 2022-10-05 MED ORDER — SODIUM CHLORIDE 0.9% FLUSH
10.0000 mL | INTRAVENOUS | Status: DC | PRN
  Administered 2022-10-05: 10 mL

## 2022-10-05 NOTE — Assessment & Plan Note (Addendum)
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  Repeat vitamin levels in May did not reveal any nutritional deficiency.  His hemoglobin is stable.

## 2022-10-05 NOTE — Progress Notes (Signed)
[facility]  [address] 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  He received 8 cycles with improvement in his skin lesions.  Unfortunately, he has had progressive worsening fatigue and debility, so cemiplimab was held infusions in 6 months and resumed.  He has a new small lesion of the right face, but overall, the lesions are stable to improved.  He will proceed with a 5th cycle of cemiplimab this week.  We will plan to see him back in 3 weeks with a CBC and comprehensive metabolic panel prior to his sixth cycle.  CLL (chronic lymphocytic leukemia) (HCC) He had a good response to acalabrutinib, but this was discontinued due to to GI bleeding.  He has had slow progression of his CLL since that time.  He has associated anemia causing him fatigue and weakness, but is otherwise doing fairly well.  We plan to continue observation at this time.  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  Repeat vitamin levels in May did not reveal any nutritional deficiency.  His hemoglobin is stable.    The patient understands the plans discussed today and is in agreement with them.  He knows to contact our office if he develops concerns prior to his next appointment.   I provided 30 minutes of face-to-face time during this encounter and > 50% was spent counseling as documented under my assessment and plan.    Adah Perl, PA-C  [facility] AT [facility] [address] [place] [address] Dept: [phone] Dept Fax: [phone]   Orders Placed This Encounter  Procedures   CBC and differential    This external order was created through the Results Console.   CBC    This external order was created through the Results Console.      CHIEF COMPLAINT:  CC: Squamous cell skin cancer  Current Treatment:  Cemiplimab every 3 weeks  HISTORY OF PRESENT ILLNESS:  [facility] [place] is a 82 y.o. 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  The CLL was confirmed by flow cytometry in July 2017.  He has had slow progression of his lymphocytosis over time, with mild anemia, but had remained on observation as he was largely asymptomatic and has multiple medical comorbidities including diabetes, congestive heart failure, B12 deficiency, cardiac arrhythmias, and history of prostate cancer. The white blood cell count has steadily increased over the years.   In October 2021, he was found to have iron deficiency despite oral iron supplementation. B12 and folate levels were normal.  He received IV iron in form of Feraheme. Oral iron was discontinued. Oral B12 was continued. His hemoglobin improved but did not normalize with the IV iron.  He received IV iron again in July 2022 when his hemoglobin was down to 9.8 with evidence of recurrent deficiency.  The CLL had been fairly stable the white blood cell count  has been running between 100,000 and 116,000 and he remained relatively asymptomatic. He was diagnosed with atrial fibrillation in January 2023.  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 His dermatology specialist at [facility] treated him with Erivedge oral chemotherapy.  He did not have much response, so was placed on cemiplimab in May 2023 with a fairly good response.  Cemiplimab was held from October  23 until March due to his failing health.  His skin lesions have fluctuated, but overall seem improved.  Due to progressive chronic lymphocytic leukemia with his white count up to 195,000, he was placed on acalabrutinib in July 2023. He initially had an increase in his white blood cell count up to 222,000 after starting acalabrutinib, which is a common finding with acalabrutinib.  He has persistent anemia without definite nutritional deficiency, so this was felt to be due to his CLL.  He then reported black stool.  He was started on pantoprazole 40 mg daily.  He had worsening diarrhea and stool studies revealed enteropathogenic E. Coli, which was treated with ciprofloxacin.   He had persistent melana and stool Hemoccults x3 were positive, so alcalabrutinib was discontinued in September.  His white blood cell count had decreased to 109,900 and his hemoglobin improved to 9.2 on September 21. His WBC came down to 15,000 in November 2023, but then started to steadily increase again.  His white count was 49,000 in December, then 57,000 in January 2024, then 58,000 in February, then 68,000 in March. The white count was stable in April, between 68.000 and 70,000, but increased to 79,000 in May. His hemoglobin was up to 10.6 in December, but has been slowly decreasing.    Oncology History  CLL (chronic lymphocytic leukemia) (HCC)  05/25/2015 Initial Diagnosis   CLL (chronic lymphocytic leukemia) (HCC)   02/04/2021 Cancer Staging   Staging form: Chronic Lymphocytic Leukemia / Small Lymphocytic Lymphoma, AJCC 8th Edition - Clinical stage from 02/04/2021: Modified Rai Stage III (Modified Rai risk: High, Binet: Stage A, Lymphocytosis: Present, Adenopathy: Absent, Organomegaly: Absent, Anemia: Present, Thrombocytopenia: Absent) - Signed by Dellia Beckwith, MD on 02/08/2021 Stage prefix: Recurrence Absolute lymphocyte count (ALC) (cells/uL): 100 Hemoglobin (Hgb) (g/dL): 16.1 Platelet count (x10E9/L of blood):  184   Cancer of the skin, basal cell  05/06/2021 Initial Diagnosis   Cancer of the skin, basal cell   08/30/2021 - 12/31/2021 Chemotherapy   Patient is on Treatment Plan : SQUAMOUS SKIN Cemiplimab q21d     08/30/2021 - 01/21/2022 Chemotherapy   Patient is on Treatment Plan : ADVANCED CUTANEOUS SQUAMOUS CELL CARCINOMA Cemiplimab q21d     07/15/2022 -  Chemotherapy   Patient is on Treatment Plan : ADVANCED CUTANEOUS SQUAMOUS CELL CARCINOMA Cemiplimab q21d     BCC (basal cell carcinoma), face  12/28/2016 Initial Diagnosis   BCC (basal cell carcinoma), face   07/15/2022 -  Chemotherapy   Patient is on Treatment Plan : ADVANCED CUTANEOUS SQUAMOUS CELL CARCINOMA Cemiplimab q21d         INTERVAL HISTORY:  Jorma is here today for repeat clinical assessment prior to a 5th cycle of cemiplimab.  He continues to report severe fatigue and weakness.  He is unable to do most of his normal activities.  He reports night sweats, but denies fevers or chills.  He reports intermittent diarrhea, with dark stool, but he is using Pepto-Bismol.  He denies hematochezia.  He denies pain. His appetite is good. His weight has  decreased 2 pounds over last 3 weeks .  REVIEW OF SYSTEMS:  Review of Systems  Constitutional:  Positive for fatigue. Negative for appetite change, chills, fever and unexpected weight change.  HENT:   Negative for lump/mass, mouth sores and sore throat.   Eyes:  Positive for eye problems.  Respiratory:  Negative for cough and shortness of breath.   Cardiovascular:  Negative for chest pain and leg swelling.  Gastrointestinal:  Positive for diarrhea (Intermittent). Negative for abdominal pain, constipation, nausea and vomiting.  Genitourinary:  Negative for difficulty urinating, dysuria, frequency and hematuria.   Musculoskeletal:  Negative for arthralgias, back pain and myalgias.  Skin:  Negative for itching, rash and wound.  Neurological:  Negative for dizziness, extremity weakness, headaches,  light-headedness and numbness.  Hematological:  Negative for adenopathy. Does not bruise/bleed easily.  Psychiatric/Behavioral:  Negative for depression and sleep disturbance. The patient is not nervous/anxious.      VITALS:  Blood pressure 103/63, pulse (!) 59, temperature 98.5 F (36.9 C), temperature source Oral, resp. rate 18, height 5\' 10"  (1.778 m), weight 152 lb (68.9 kg), SpO2 99 %.  Wt Readings from Last 3 Encounters:  10/05/22 152 lb (68.9 kg)  09/22/22 154 lb 3.5 oz (70 kg)  09/16/22 154 lb (69.9 kg)    Body mass index is 21.81 kg/m.  Performance status (ECOG): 3 - Symptomatic, >50% confined to bed  PHYSICAL EXAM:  Physical Exam Vitals and nursing note reviewed.  Constitutional:      General: He is not in acute distress.    Appearance: Normal appearance. He is normal weight. He is ill-appearing (Chronically ill-appearing elderly gentleman in a wheelchair).  HENT:     Head: Normocephalic and atraumatic.     Mouth/Throat:     Mouth: Mucous membranes are moist.     Pharynx: Oropharynx is clear. No oropharyngeal exudate or posterior oropharyngeal erythema.  Eyes:     General: No scleral icterus.    Extraocular Movements: Extraocular movements intact.     Conjunctiva/sclera: Conjunctivae normal.     Pupils: Pupils are equal, round, and reactive to light.  Cardiovascular:     Rate and Rhythm: Normal rate and regular rhythm.     Heart sounds: Normal heart sounds. No murmur heard.    No friction rub. No gallop.  Pulmonary:     Effort: Pulmonary effort is normal.     Breath sounds: Normal breath sounds. No wheezing, rhonchi or rales.  Abdominal:     General: Bowel sounds are normal. There is no distension.     Palpations: Abdomen is soft. There is no hepatomegaly, splenomegaly or mass.     Tenderness: There is no abdominal tenderness.  Musculoskeletal:        General: Normal range of motion.     Cervical back: Normal range of motion and neck supple. No tenderness.      Right lower leg: No edema.     Left lower leg: No edema.  Lymphadenopathy:     Cervical: No cervical adenopathy.     Upper Body:     Right upper body: No supraclavicular or axillary adenopathy.     Left upper body: No supraclavicular or axillary adenopathy.     Lower Body: No right inguinal adenopathy. No left inguinal adenopathy.  Skin:    General: Skin is warm and dry.     Coloration: Skin is not jaundiced.     Findings: No rash.     Comments: Scattered keratoses and skin cancers  of his face and ears.The right side of the nose has improved.  The hemorrhagic area on the left nose is improved. Both cheeks have scattered lesions, with a new 1 cm lesion in the right cheek.   Multiple areas of erythema of the skin on the scalp, dorsum of the left hand, and anterior tibia associate with keratotic lesions and probable skin cancers.   Neurological:     Mental Status: He is alert and oriented to person, place, and time.     Cranial Nerves: No cranial nerve deficit.  Psychiatric:        Mood and Affect: Mood normal.        Behavior: Behavior normal.        Thought Content: Thought content normal.     LABS:      Latest Ref Rng & Units 10/05/2022   12:00 AM 09/14/2022   12:00 AM 08/24/2022   12:00 AM  CBC  WBC  86.3     79.2     68.7      Hemoglobin 13.5 - 17.5 9.5     9.6     9.6      Hematocrit 41 - 53 31     31     30       Platelets 150 - 400 K/uL 208     260     214         This result is from an external source.      Latest Ref Rng & Units 09/14/2022   12:00 AM 08/24/2022   12:00 AM 08/02/2022   12:00 AM  CMP  BUN 4 - 21 20     24     22       Creatinine 0.6 - 1.3 1.0     1.0     0.9      Sodium 137 - 147 139     139     137      Potassium 3.5 - 5.1 mEq/L 4.2     4.0     4.1      Chloride 99 - 108 108     106     106      CO2 13 - 22 27     26     25       Calcium 8.7 - 10.7 8.4     8.2     8.3      Alkaline Phos 25 - 125 65     65     51      AST 14 - 40 23     27     30        ALT 10 - 40 U/L 20     22     26          This result is from an external source.     No results found for: "CEA1", "CEA" / No results found for: "CEA1", "CEA" No results found for: "PSA1" No results found for: "RUE454" No results found for: "CAN125"  No results found for: "TOTALPROTELP", "ALBUMINELP", "A1GS", "A2GS", "BETS", "BETA2SER", "GAMS", "MSPIKE", "SPEI" Lab Results  Component Value Date   TIBC 301 09/14/2022   TIBC 267 12/31/2021   TIBC 291 09/15/2021   FERRITIN 59 09/14/2022   FERRITIN 248 12/31/2021   FERRITIN 48 09/15/2021   IRONPCTSAT 13 (L) 09/14/2022   IRONPCTSAT 22 12/31/2021   IRONPCTSAT 13 (L) 09/15/2021   Lab Results  Component Value  Date   LDH 166 08/03/2020    STUDIES:  No results found.    HISTORY:   Past Medical History:  Diagnosis Date   Anemia in neoplastic disease 10/05/2022   B12 deficiency    Cancer of the skin, 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   CHF (congestive heart failure) (HCC)    Diabetes mellitus without complication (HCC)    History of prostate cancer    Hyperlipidemia    Hypertension    Hypoglycemia 05/06/2021   Iron deficiency anemia 02/20/2020   Leucocytosis    Leukemia, lymphocytic, chronic (HCC)     Past Surgical History:  Procedure Laterality Date   PROSTATE BIOPSY      Family History  Problem Relation Age of Onset   Ovarian cancer Mother 69   Cancer Sister 67    Social History:  reports that he has quit smoking. He has never used smokeless tobacco. He reports that he does not currently use alcohol. He reports that he does not use drugs.The patient is accompanied by his daughter today.  Allergies: No Known Allergies  Current Medications: Current Outpatient Medications  Medication Sig Dispense Refill   cefdinir (OMNICEF) 300 MG capsule Take 300 mg by mouth 2 (two) times daily.     doxycycline (VIBRA-TABS) 100 MG tablet Take 100 mg by mouth daily.     mupirocin ointment (BACTROBAN) 2 % Apply topically daily.      acetaminophen (TYLENOL) 500 MG tablet Take 1,000 mg by mouth every 6 (six) hours as needed for mild pain, moderate pain or fever.     amiodarone (PACERONE) 200 MG tablet Take 200 mg by mouth daily.     apixaban (ELIQUIS) 5 MG TABS tablet Take 1 tablet by mouth 2 (two) times daily.     Ascorbic Acid (VITAMIN C) 500 MG CAPS Take 500 mg by mouth daily.     bismuth subsalicylate (PEPTO BISMOL) 262 MG/15ML suspension Take 30 mLs by mouth every 6 (six) hours as needed. For constipation     cholecalciferol (VITAMIN D3) 25 MCG (1000 UNIT) tablet Take 1,000 Units by mouth daily.     furosemide (LASIX) 40 MG tablet Take 0.5 tablets (20 mg total) by mouth daily. 30 tablet 0   ketoconazole (NIZORAL) 2 % shampoo Apply topically 3 (three) times a week. (Patient not taking: Reported on 10/05/2022)     levothyroxine (SYNTHROID) 75 MCG tablet Take 75 mcg by mouth daily before breakfast.     metFORMIN (GLUCOPHAGE-XR) 500 MG 24 hr tablet Take 500 mg by mouth 2 (two) times daily.     midodrine (PROAMATINE) 5 MG tablet Take 1 tablet (5 mg total) by mouth 3 (three) times daily with meals. 90 tablet 3   ofloxacin (OCUFLOX) 0.3 % ophthalmic solution 1 drop 4 (four) times daily.     Omega-3 Fatty Acids (FISH OIL) 1000 MG CAPS Take 1,000 mg by mouth daily.     ondansetron (ZOFRAN) 4 MG tablet Take 1 tablet (4 mg total) by mouth every 6 (six) hours as needed for nausea. 20 tablet 0   pantoprazole (PROTONIX) 40 MG tablet TAKE 1 TABLET BY MOUTH EVERY DAY (Patient not taking: Reported on 10/05/2022) 90 tablet 1   polyethylene glycol (MIRALAX / GLYCOLAX) 17 g packet Take 17 g by mouth daily as needed for mild constipation. 14 each 0   rOPINIRole (REQUIP) 0.5 MG tablet Take 0.5-1 mg by mouth See admin instructions. Taking one tablet at bedtime, if no relief take an additional tablet  rosuvastatin (CRESTOR) 20 MG tablet Take 10 mg by mouth daily.     vitamin B-12 (CYANOCOBALAMIN) 500 MCG tablet Take 500 mcg by mouth daily.      Current Facility-Administered Medications  Medication Dose Route Frequency Provider Last Rate Last Admin   sodium chloride flush (NS) 0.9 % injection 10 mL  10 mL Intracatheter PRN Cassey Bacigalupo A, PA-C   10 mL at 10/05/22 1500

## 2022-10-05 NOTE — Progress Notes (Signed)
CRITICAL VALUE STICKER  CRITICAL VALUE:  WBC 86.3  RECEIVER (on-site recipient of call):  Dyane Dustman RN  DATE & TIME NOTIFIED:   10/05/2022 @ 1526  MESSENGER (representative from lab):  [facility]  MD NOTIFIED:   Belva Crome, PA   TIME OF NOTIFICATION:  1527  RESPONSE:  Patient scheduled to she [facility] today.

## 2022-10-05 NOTE — Assessment & Plan Note (Signed)
Multiple skin cancers, especially covering his face, for which he had been on Erivedge therapy.  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  He received 8 cycles with improvement in his skin lesions.  Unfortunately, he has had progressive worsening fatigue and debility, so cemiplimab was held infusions in 6 months and resumed.  He has a new small lesion of the right face, but overall, the lesions are stable to improved.  He will proceed with a 5th cycle of cemiplimab this week.  We will plan to see him back in 3 weeks with a CBC and comprehensive metabolic panel prior to his sixth cycle.

## 2022-10-05 NOTE — Assessment & Plan Note (Addendum)
He had a good response to acalabrutinib, but this was discontinued due to to GI bleeding.  He has had slow progression of his CLL since that time.  He has associated anemia causing him fatigue and weakness, but is otherwise doing fairly well.  We plan to continue observation at this time.

## 2022-10-06 ENCOUNTER — Encounter: Payer: Self-pay | Admitting: Hematology and Oncology

## 2022-10-06 ENCOUNTER — Encounter: Payer: Self-pay | Admitting: Oncology

## 2022-10-07 ENCOUNTER — Inpatient Hospital Stay: Payer: Medicare Other

## 2022-10-07 VITALS — BP 135/79 | HR 67 | Temp 97.6°F | Resp 12

## 2022-10-07 DIAGNOSIS — Z5112 Encounter for antineoplastic immunotherapy: Secondary | ICD-10-CM | POA: Diagnosis not present

## 2022-10-07 DIAGNOSIS — C4491 Basal cell carcinoma of skin, unspecified: Secondary | ICD-10-CM

## 2022-10-07 DIAGNOSIS — C44310 Basal cell carcinoma of skin of unspecified parts of face: Secondary | ICD-10-CM

## 2022-10-07 MED ORDER — SODIUM CHLORIDE 0.9% FLUSH
10.0000 mL | INTRAVENOUS | Status: DC | PRN
  Administered 2022-10-07: 10 mL

## 2022-10-07 MED ORDER — HEPARIN SOD (PORK) LOCK FLUSH 100 UNIT/ML IV SOLN
500.0000 [IU] | Freq: Once | INTRAVENOUS | Status: AC | PRN
  Administered 2022-10-07: 500 [IU]

## 2022-10-07 MED ORDER — SODIUM CHLORIDE 0.9 % IV SOLN: Freq: Once | INTRAVENOUS | Status: AC

## 2022-10-07 MED ORDER — SODIUM CHLORIDE 0.9 % IV SOLN
350.0000 mg | Freq: Once | INTRAVENOUS | Status: AC
  Administered 2022-10-07: 350 mg via INTRAVENOUS
  Filled 2022-10-07: qty 7

## 2022-10-25 NOTE — Progress Notes (Signed)
[facility]  [address] 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  He received 8 cycles with improvement in his skin lesions.  Unfortunately, he has had progressive worsening fatigue and debility and so we stopped his infusions but have now resumed them as of February, 2024.  The lesions of his skin fluctuate but overall are improved.   CLL (chronic lymphocytic leukemia) (HCC) We held off on treatment of his CLL due to multiple medical problems.  His skin cancer is now being treated with cemiplimab and he is tolerating this well.  He has worsening anemia, now requiring transfusion.  This is likely due to his CLL, as he received IV iron recently. He started acalabrutinib 100 mg twice daily for his CLL on July 21st. He has had an increase in his white blood cell count since starting acalabrutinib.  An intial increase in the lymphocytosis is common with acalabrutinib.  His white blood cell count has decreased somewhat from 222,000 to 212,000.  He has persistent anemia without definite nutritional deficiency, so this was felt to be due to his CLL.  He then reported black stool.  He was started on pantoprazole 40 mg daily.  He had worsening diarrhea and stool studies revealed enteropathogenic E. Coli, which was treated with ciprofloxacin.   He had persistent melana and stool Hemoccults x3 were positive, so alcalabrutinib was discontinued on September 15th, 2023.  His white blood cell count decreased to 109,900  and his hemoglobin improved to 9.2 on September 21. His WBC came down to 15,000 in November, 2023. Since stopping treatment, his white count is rising again to 49,000, then 57,000 in January, 2024, 58,000 in February, and 68,000 in March. His WBC then leveled off for the last two checkups but is increased to 89,000 by June, 2024.     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 He was transfused in August of 2023, when his hemoglobin dropped to 8 and he was very symptomatic. He last received IV iron replacement in June.  He required transfusion in August when his hemoglobin dropped to 8 and he was symptomatic. His hemoglobin has been better since December. It was up to 10.4 in February and went down to 9.4 it has now been 9.6 for the last two checks, up to 9.9 today.    Plan:  He stopped acalabrutinib for his CLL in September, 2023 and he is on observation only for his leukemia. He was recently placed on Questran powder once a day for his diarrhea. His skin on his face has improved greatly other than the hemorrhagic area on his left nose. His daughter informed me of a billing issue based we will look into that for them. His day 1 cycle 6 of Libtayo is scheduled for 10/28/2022. His WBC is elevated at 89.1 from 86.3, hemoglobin 9.9, and platelet count 194,000 as of today. His CMP is normal other than a elevated BUN of 26 and mildly low protein, stable to improved. I will see  him back in 3 weeks with CBC and CMP. The patient and his daughter understand the plans discussed today and are in agreement with them.  They know to contact our office if he develops concerns prior to his next appointment.  I provided 23 minutes of face-to-face time during this this encounter and > 50% was spent counseling as documented under my assessment and plan.   Dellia Beckwith, MD  [facility] [address] [place] [address] Dept:  [phone] Dept Fax: [phone]   No orders of the defined types were placed in this encounter.    CHIEF COMPLAINT:  CC:  Squamous cell skin cancers, CLL  Current Treatment: Libtayo  HISTORY OF PRESENT ILLNESS:   Oncology History  CLL (chronic lymphocytic leukemia) (HCC)  05/25/2015 Initial Diagnosis   CLL (chronic lymphocytic leukemia) (HCC)   02/04/2021 Cancer Staging   Staging form: Chronic Lymphocytic Leukemia / Small Lymphocytic Lymphoma, AJCC 8th Edition - Clinical stage from 02/04/2021: Modified Rai Stage III (Modified Rai risk: High, Binet: Stage A, Lymphocytosis: Present, Adenopathy: Absent, Organomegaly: Absent, Anemia: Present, Thrombocytopenia: Absent) - Signed by Dellia Beckwith, MD on 02/08/2021 Stage prefix: Recurrence Absolute lymphocyte count (ALC) (cells/uL): 100 Hemoglobin (Hgb) (g/dL): 13.0 Platelet count (x10E9/L of blood): 184   Cancer of the skin, basal cell  05/06/2021 Initial Diagnosis   Cancer of the skin, basal cell   08/30/2021 - 12/31/2021 Chemotherapy   Patient is on Treatment Plan : SQUAMOUS SKIN Cemiplimab q21d     08/30/2021 - 01/21/2022 Chemotherapy   Patient is on Treatment Plan : ADVANCED CUTANEOUS SQUAMOUS CELL CARCINOMA Cemiplimab q21d     07/15/2022 -  Chemotherapy   Patient is on Treatment Plan : ADVANCED CUTANEOUS SQUAMOUS CELL CARCINOMA Cemiplimab q21d     BCC (basal cell carcinoma), face  12/28/2016 Initial Diagnosis   BCC (basal cell carcinoma), face   07/15/2022 -  Chemotherapy   Patient is on Treatment Plan : ADVANCED CUTANEOUS SQUAMOUS CELL CARCINOMA Cemiplimab q21d        INTERVAL HISTORY:  Yeremy is here today for repeat clinical assessment after stopping acalabrutinib for his CLL in September, 2023. His CLL has just been very slowly progressive. Patient states that he feels well and complains of fatigue and IBS with loose bowels. He was recently placed on Questran powder once a day for his diarrhea. His skin on his face has  improved greatly other than the hemorrhagic area on his left nose. She informed me of a billing issue of his treatment and I will look into that for them. His day 1 cycle 6 of Libtayo is scheduled for 10/28/2022. His WBC is elevated at 89.1 from 86.3, hemoglobin 9.9, and platelet count 194,000 as of today. His CMP is normal other than a elevated BUN of 26 and mildly low protein, stable to improved. I will see him back in 3 weeks with CBC and CMP prior to his next Libtayo. He denies signs of infection such as sore throat, sinus drainage, cough, or urinary symptoms.  He denies fevers or recurrent chills. He denies pain. He denies nausea, vomiting, chest pain, dyspnea or cough. His appetite is good and his weight has decreased 3 pounds over last month . He is accompanied by his daughter at today's visit as always.   REVIEW OF SYSTEMS:  Review of Systems  Constitutional:  Positive for fatigue. Negative for appetite change, chills, diaphoresis, fever and unexpected weight change.  HENT:  Negative.  Negative for hearing loss, lump/mass, mouth sores, nosebleeds, sore throat, tinnitus, trouble swallowing and voice change.   Eyes: Negative.  Negative for eye problems and icterus.  Respiratory: Negative.  Negative for chest tightness, cough, hemoptysis, shortness of breath and wheezing.   Cardiovascular: Negative.  Negative for chest pain, leg swelling and palpitations.  Gastrointestinal: Negative.  Negative for abdominal distention, abdominal pain, blood in stool, constipation, diarrhea, nausea, rectal pain and vomiting.  Endocrine: Negative.  Negative for hot flashes.  Genitourinary: Negative.  Negative for bladder incontinence, difficulty urinating, dyspareunia, dysuria, frequency, hematuria, nocturia, pelvic pain and penile discharge.   Musculoskeletal:  Negative for arthralgias, back pain, flank pain, gait problem, myalgias, neck pain and neck stiffness.  Skin: Negative.  Negative for itching, rash and  wound.       keratotic skin lesions w/ erythema  Neurological:  Positive for extremity weakness. Negative for dizziness, gait problem, headaches, light-headedness, numbness, seizures and speech difficulty.       Generalized weakness.  Hematological: Negative.  Negative for adenopathy. Does not bruise/bleed easily.  Psychiatric/Behavioral: Negative.  Negative for confusion, decreased concentration, depression, sleep disturbance and suicidal ideas. The patient is not nervous/anxious.      VITALS:  Blood pressure (!) 117/56, pulse 62, temperature 98.6 F (37 C), temperature source Oral, resp. rate 18, height 5\' 10"  (1.778 m), weight 151 lb 12.8 oz (68.9 kg), SpO2 96 %.  Wt Readings from Last 3 Encounters:  10/28/22 151 lb (68.5 kg)  10/26/22 151 lb 12.8 oz (68.9 kg)  10/05/22 152 lb (68.9 kg)    Body mass index is 21.78 kg/m.  Performance status (ECOG): 2 - Symptomatic, <50% confined to bed  PHYSICAL EXAM:  Physical Exam Vitals and nursing note reviewed. Exam conducted with a chaperone present.  Constitutional:      General: He is not in acute distress.    Appearance: Normal appearance. He is normal weight. He is not ill-appearing, toxic-appearing or diaphoretic.  HENT:     Head: Normocephalic and atraumatic.     Comments: Raised hemorraghic area on the left side of the nose Scaly areas on the left maxillary area Otherwise face is improved.  Keratotic areas on the ears but improved.       Right Ear: Tympanic membrane, ear canal and external ear normal. There is no impacted cerumen.     Left Ear: Tympanic membrane, ear canal and external ear normal. There is no impacted cerumen.     Nose: Nose normal. No congestion or rhinorrhea.     Mouth/Throat:     Mouth: Mucous membranes are moist.     Pharynx: Oropharynx is clear. No oropharyngeal exudate or posterior oropharyngeal erythema.  Eyes:     General: No scleral icterus.       Right eye: No discharge.        Left eye: No  discharge.     Extraocular Movements: Extraocular movements intact.     Conjunctiva/sclera: Conjunctivae normal.     Pupils: Pupils are equal, round, and reactive to light.  Neck:     Vascular: No carotid bruit.  Cardiovascular:     Rate and Rhythm: Normal rate and regular rhythm.     Pulses: Normal pulses.     Heart sounds: Murmur heard.     Systolic murmur is present with a grade of 2/6.     No friction rub. No gallop.  Pulmonary:     Effort: Pulmonary effort is normal. No respiratory distress.  Breath sounds: Normal breath sounds. No stridor. No wheezing, rhonchi or rales.  Chest:     Chest wall: No tenderness.  Abdominal:     General: Bowel sounds are normal. There is no distension.     Palpations: Abdomen is soft. There is no hepatomegaly, splenomegaly or mass.     Tenderness: There is no abdominal tenderness. There is no right CVA tenderness, left CVA tenderness, guarding or rebound.     Hernia: No hernia is present.  Musculoskeletal:        General: No swelling, tenderness, deformity or signs of injury. Normal range of motion.     Cervical back: Normal range of motion and neck supple. No rigidity or tenderness.     Right lower leg: 1+ Edema present.     Left lower leg: 1+ Edema present.     Comments: 1+ edema with some reaction to the anterior tibia.  Scattered skin cancers    Lymphadenopathy:     Cervical: No cervical adenopathy.     Upper Body:     Right upper body: No supraclavicular or axillary adenopathy.     Left upper body: No supraclavicular or axillary adenopathy.  Skin:    General: Skin is warm and dry.     Coloration: Skin is not jaundiced or pale.     Findings: Lesion present. No bruising, erythema or rash.     Comments: Multiple areas of erythema of the skin on the scalp, face, ears, and dorsum of the left hand, anterior tibia associate with keratotic lesions and probable skin cancers.  Overall the face is significantly improved except for 1 hemorrhagic  raised area on the left side of the nose.   Neurological:     General: No focal deficit present.     Mental Status: He is alert and oriented to person, place, and time. Mental status is at baseline.     Cranial Nerves: No cranial nerve deficit.     Sensory: No sensory deficit.     Motor: No weakness.     Coordination: Coordination normal.     Gait: Gait normal.     Deep Tendon Reflexes: Reflexes normal.  Psychiatric:        Mood and Affect: Mood normal.        Behavior: Behavior normal.        Thought Content: Thought content normal.        Judgment: Judgment normal.     LABS:      Latest Ref Rng & Units 10/26/2022   12:00 AM 10/05/2022   12:00 AM 09/14/2022   12:00 AM  CBC  WBC  89.1     86.3     79.2      Hemoglobin 13.5 - 17.5 9.9     9.5     9.6      Hematocrit 41 - 53 32     31     31      Platelets 150 - 400 K/uL 194     208     260         This result is from an external source.      Latest Ref Rng & Units 10/26/2022   12:00 AM 10/05/2022    2:46 PM 09/14/2022   12:00 AM  CMP  Glucose 70 - 99 mg/dL  284    BUN 4 - 21 26     21  20       Creatinine 0.6 - 1.3 0.9  0.93  1.0      Sodium 137 - 147 139     139  139      Potassium 3.5 - 5.1 mEq/L 4.1     3.6  4.2      Chloride 99 - 108 106     107  108      CO2 13 - 22 23     23  27       Calcium 8.7 - 10.7 8.4     8.2  8.4      Total Protein 6.5 - 8.1 g/dL  5.6    Total Bilirubin 0.3 - 1.2 mg/dL  0.4    Alkaline Phos 25 - 125 53     48  65      AST 14 - 40 28     21  23       ALT 10 - 40 U/L 18     20  20          This result is from an external source.   Component Ref Range & Units 09/14/2022 9 mo ago  Folate >5.9 ng/mL 29.0 VC 15.8 CM     Component Ref Range & Units 09/14/2022 9 mo ago  Transferrin Receptor 12.2 - 27.3 nmol/L 18.4 11.3 Low  CM   Component Ref Range & Units 09/14/2022 9 mo ago  Vitamin B-12 180 - 914 pg/mL 547 493 CM              Component Ref Range & Units 2 wk ago (08/24/22) 2 mo  ago (07/08/22) 5 mo ago (04/14/22) 6 mo ago (03/03/22) 7 mo ago (02/10/22) 7 mo ago (01/20/22) 8 mo ago (12/30/21)  TSH 0.350 - 4.500 uIU/mL 1.955 1.748 CM 2.417 CM 2.615 CM 1.543 CM 1.544 CM 1.677 CM     Component Ref Range & Units 2 wk ago 2 mo ago 5 mo ago 6 mo ago 7 mo ago 8 mo ago 9 mo ago  T4, Total 4.5 - 12.0 ug/dL 9.2 8.0 CM 8.7 CM 8.1 CM 8.3 CM 9.4 CM 9.4 CM   No results found for: "CEA1", "CEA" / No results found for: "CEA1", "CEA" No results found for: "PSA1" No results found for: "ZOX096" No results found for: "CAN125"  No results found for: "TOTALPROTELP", "ALBUMINELP", "A1GS", "A2GS", "BETS", "BETA2SER", "GAMS", "MSPIKE", "SPEI" Lab Results  Component Value Date   TIBC 301 09/14/2022   TIBC 267 12/31/2021   TIBC 291 09/15/2021   FERRITIN 59 09/14/2022   FERRITIN 248 12/31/2021   FERRITIN 48 09/15/2021   IRONPCTSAT 13 (L) 09/14/2022   IRONPCTSAT 22 12/31/2021   IRONPCTSAT 13 (L) 09/15/2021   Lab Results  Component Value Date   LDH 166 08/03/2020    STUDIES:  No results found.    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 He has never used smokeless tobacco. He reports that he does not currently use alcohol. He reports that he does not use drugs.The patient is accompanied by his daughter today.  Allergies: No Known Allergies  Current  Medications: Current Outpatient Medications  Medication Sig Dispense Refill   acetaminophen (TYLENOL) 500 MG tablet Take 1,000 mg by mouth every 6 (six) hours as needed for mild pain, moderate pain or fever.     amiodarone (PACERONE) 200 MG tablet Take 200 mg by mouth daily.     apixaban (ELIQUIS) 5 MG TABS tablet Take 1 tablet by mouth 2 (two) times daily.     Ascorbic Acid (VITAMIN C) 500 MG CAPS Take 500 mg by mouth daily.     benazepril (LOTENSIN) 20 MG tablet Take 20 mg by mouth daily.     bismuth subsalicylate (PEPTO BISMOL) 262 MG/15ML suspension Take 30 mLs by mouth every 6 (six) hours as needed. For constipation     cefdinir (OMNICEF) 300 MG capsule Take 300 mg by mouth 2 (two) times daily.     cholecalciferol (VITAMIN D3) 25 MCG (1000 UNIT) tablet Take 1,000 Units by mouth daily.     cholestyramine (QUESTRAN) 4 g packet Take 1 packet by mouth 3 (three) times daily.     doxycycline (VIBRA-TABS) 100 MG tablet Take 100 mg by mouth daily.     furosemide (LASIX) 40 MG tablet Take 0.5 tablets (20 mg total) by mouth daily. 30 tablet 0   ketoconazole (NIZORAL) 2 % shampoo Apply topically 3 (three) times a week. (Patient not taking: Reported on 10/05/2022)     levothyroxine (SYNTHROID) 75 MCG tablet Take 75 mcg by mouth daily before breakfast.     metFORMIN (GLUCOPHAGE-XR) 500 MG 24 hr tablet Take 500 mg by mouth 2 (two) times daily.     midodrine (PROAMATINE) 5 MG tablet TAKE 1 TABLET (5 MG TOTAL) BY MOUTH 3 (THREE) TIMES DAILY WITH MEALS. 270 tablet 1   mupirocin ointment (BACTROBAN) 2 % Apply topically daily.     ofloxacin (OCUFLOX) 0.3 % ophthalmic solution 1 drop 4 (four) times daily.     Omega-3 Fatty Acids (FISH OIL) 1000 MG CAPS Take 1,000 mg by mouth daily.     ondansetron (ZOFRAN) 4 MG tablet Take 1 tablet (4 mg total) by mouth every 6 (six) hours as needed for nausea. 20 tablet 0   pantoprazole (PROTONIX) 40 MG tablet TAKE 1 TABLET BY MOUTH EVERY DAY (Patient not taking: Reported on  10/05/2022) 90 tablet 1   polyethylene glycol (MIRALAX / GLYCOLAX) 17 g packet Take 17 g by mouth daily as needed for mild constipation. 14 each 0   rOPINIRole (REQUIP) 0.5 MG tablet Take 0.5-1 mg by mouth See admin instructions. Taking one tablet at bedtime, if no relief take an additional tablet     rosuvastatin (CRESTOR) 20 MG tablet Take 10 mg by mouth daily.     vitamin B-12 (CYANOCOBALAMIN) 500 MCG tablet Take 500 mcg by mouth daily.     Current Facility-Administered Medications  Medication Dose Route Frequency Provider Last Rate Last Admin   sodium chloride flush (NS) 0.9 % injection 10 mL  10 mL Intracatheter PRN Mosher, Harvin Hazel A, PA-C   10 mL at 10/26/22 1403     I,Jasmine M Lassiter,acting as a scribe for Dellia Beckwith, MD.,have documented all relevant documentation on the behalf of Dellia Beckwith, MD,as directed by  Dellia Beckwith, MD while in the presence of Dellia Beckwith, MD.

## 2022-10-26 ENCOUNTER — Inpatient Hospital Stay (INDEPENDENT_AMBULATORY_CARE_PROVIDER_SITE_OTHER): Payer: Medicare Other | Admitting: Oncology

## 2022-10-26 ENCOUNTER — Inpatient Hospital Stay: Payer: Medicare Other

## 2022-10-26 VITALS — BP 117/56 | HR 62 | Temp 98.6°F | Resp 18 | Ht 70.0 in | Wt 151.8 lb

## 2022-10-26 DIAGNOSIS — Z5112 Encounter for antineoplastic immunotherapy: Secondary | ICD-10-CM | POA: Diagnosis not present

## 2022-10-26 DIAGNOSIS — C44310 Basal cell carcinoma of skin of unspecified parts of face: Secondary | ICD-10-CM | POA: Diagnosis not present

## 2022-10-26 DIAGNOSIS — C9110 Chronic lymphocytic leukemia of B-cell type not having achieved remission: Secondary | ICD-10-CM

## 2022-10-26 DIAGNOSIS — C4440 Unspecified malignant neoplasm of skin of scalp and neck: Secondary | ICD-10-CM | POA: Diagnosis not present

## 2022-10-26 DIAGNOSIS — C4491 Basal cell carcinoma of skin, unspecified: Secondary | ICD-10-CM

## 2022-10-26 DIAGNOSIS — D509 Iron deficiency anemia, unspecified: Secondary | ICD-10-CM

## 2022-10-26 LAB — BASIC METABOLIC PANEL
BUN: 26 — AB (ref 4–21)
CO2: 23 — AB (ref 13–22)
Chloride: 106 (ref 99–108)
Creatinine: 0.9 (ref 0.6–1.3)
EGFR: 60
Glucose: 126
Potassium: 4.1 mEq/L (ref 3.5–5.1)
Sodium: 139 (ref 137–147)

## 2022-10-26 LAB — CBC AND DIFFERENTIAL
HCT: 32 — AB (ref 41–53)
Hemoglobin: 9.9 — AB (ref 13.5–17.5)
Neutrophils Absolute: 8.02
Platelets: 194 10*3/uL (ref 150–400)
WBC: 89.1

## 2022-10-26 LAB — COMPREHENSIVE METABOLIC PANEL
Albumin: 3.2 — AB (ref 3.5–5.0)
Calcium: 8.4 — AB (ref 8.7–10.7)

## 2022-10-26 LAB — HEPATIC FUNCTION PANEL
ALT: 18 U/L (ref 10–40)
AST: 28 (ref 14–40)
Alkaline Phosphatase: 53 (ref 25–125)
Bilirubin, Total: 0.5

## 2022-10-26 LAB — TSH: TSH: 1.888 u[IU]/mL (ref 0.350–4.500)

## 2022-10-26 LAB — CBC: RBC: 3.47 — AB (ref 3.87–5.11)

## 2022-10-26 MED ORDER — SODIUM CHLORIDE 0.9% FLUSH
10.0000 mL | INTRAVENOUS | Status: DC | PRN
  Administered 2022-10-26: 10 mL

## 2022-10-26 MED ORDER — HEPARIN SOD (PORK) LOCK FLUSH 100 UNIT/ML IV SOLN
500.0000 [IU] | Freq: Once | INTRAVENOUS | Status: AC | PRN
  Administered 2022-10-26: 500 [IU]

## 2022-10-26 NOTE — Progress Notes (Signed)
CRITICAL VALUE STICKER  CRITICAL VALUE:  WBC 89.1  RECEIVER (on-site recipient of call):  Dyane Dustman RN  DATE & TIME NOTIFIED:   10/26/2022 @ 1623  MESSENGER (representative from lab):  [facility]  MD NOTIFIED:  Dr. Gilman Buttner   TIME OF NOTIFICATION:  1627  RESPONSE:  In seeing patient now.

## 2022-10-27 NOTE — Progress Notes (Signed)
Patient was asking about co-pay assistance, he had a grant through [facility] which was closed due to patient meeting his out of pocket last year and no bills to submit. I called and spoke with [employer] and asked that they reinstate patients grant, he has $4600 dollars left. They will have a decision in 7 to 10 days, I will call back to find out what they decide.

## 2022-10-28 ENCOUNTER — Inpatient Hospital Stay: Payer: Medicare Other

## 2022-10-28 VITALS — BP 114/55 | HR 64 | Temp 98.4°F | Resp 18 | Ht 70.0 in | Wt 151.0 lb

## 2022-10-28 DIAGNOSIS — C44310 Basal cell carcinoma of skin of unspecified parts of face: Secondary | ICD-10-CM

## 2022-10-28 DIAGNOSIS — Z5112 Encounter for antineoplastic immunotherapy: Secondary | ICD-10-CM | POA: Diagnosis not present

## 2022-10-28 DIAGNOSIS — C4491 Basal cell carcinoma of skin, unspecified: Secondary | ICD-10-CM

## 2022-10-28 LAB — T4: T4, Total: 7.6 ug/dL (ref 4.5–12.0)

## 2022-10-28 MED ORDER — SODIUM CHLORIDE 0.9 % IV SOLN
350.0000 mg | Freq: Once | INTRAVENOUS | Status: AC
  Administered 2022-10-28: 350 mg via INTRAVENOUS
  Filled 2022-10-28: qty 7

## 2022-10-28 MED ORDER — SODIUM CHLORIDE 0.9% FLUSH
10.0000 mL | INTRAVENOUS | Status: DC | PRN
  Administered 2022-10-28: 10 mL

## 2022-10-28 MED ORDER — HEPARIN SOD (PORK) LOCK FLUSH 100 UNIT/ML IV SOLN
500.0000 [IU] | Freq: Once | INTRAVENOUS | Status: AC | PRN
  Administered 2022-10-28: 500 [IU]

## 2022-10-28 MED ORDER — SODIUM CHLORIDE 0.9 % IV SOLN: Freq: Once | INTRAVENOUS | Status: AC

## 2022-10-28 NOTE — Patient Instructions (Signed)
Cemiplimab Injection What is this medication? CEMIPLIMAB (se MIP li mab) treats skin cancer and lung cancer. It works by helping your immune system slow or stop the spread of cancer cells. It is a monoclonal antibody. This medicine may be used for other purposes; ask your health care provider or pharmacist if you have questions. COMMON BRAND NAME(S): LIBTAYO What should I tell my care team before I take this medication? They need to know if you have any of these conditions: Allogeneic stem cell transplant (uses someone else's stem cells) Autoimmune diseases, such as Crohn disease, ulcerative colitis, lupus History of chest radiation Nervous system problems, such as Guillain-Barre syndrome or myasthenia gravis Organ transplant An unusual or allergic reaction to cemiplimab, other medications, foods, dyes, or preservatives Pregnant or trying to get pregnant Breastfeeding How should I use this medication? This medication is infused into a vein. It is given by your care team in a hospital or clinic setting. A special MedGuide will be given to you before each treatment. Be sure to read this information carefully each time. Talk to your care team about the use of this medication in children. Special care may be needed. Overdosage: If you think you have taken too much of this medicine contact a poison control center or emergency room at once. NOTE: This medicine is only for you. Do not share this medicine with others. What if I miss a dose? Keep appointments for follow-up doses. It is important not to miss your dose. Call your care team if you are unable to keep an appointment. What may interact with this medication? Interactions have not been studied. This list may not describe all possible interactions. Give your health care provider a list of all the medicines, herbs, non-prescription drugs, or dietary supplements you use. Also tell them if you smoke, drink alcohol, or use illegal drugs. Some  items may interact with your medicine. What should I watch for while using this medication? Visit your care team for regular checks on your progress. It may be some time before you see the benefit from this medication. You may need blood work done while taking this medication. This medication may cause serious skin reactions. They can happen weeks to months after starting the medication. Contact your care team right away if you notice fevers or flu-like symptoms with a rash. The rash may be red or purple and then turn into blisters or peeling of the skin. You may also notice a red rash with swelling of the face, lips, or lymph nodes in your neck or under your arms. Tell your care team right away if you have any change in your eyesight. Talk to your care team if you may be pregnant. You will need a negative pregnancy test before starting this medication. Contraception is recommended while taking this medication and for 4 months after the last dose. Your care team can help you find the option that works for you. Do not breastfeed while taking this medication and for at least 4 months after the last dose. What side effects may I notice from receiving this medication? Side effects that you should report to your care team as soon as possible: Allergic reactions--skin rash, itching, hives, swelling of the face, lips, tongue, or throat Dry cough, shortness of breath or trouble breathing Eye pain, redness, irritation, or discharge with blurry or decreased vision Heart muscle inflammation--unusual weakness or fatigue, shortness of breath, chest pain, fast or irregular heartbeat, dizziness, swelling of the ankles, feet, or hands  Hormone gland problems--headache, sensitivity to light, unusual weakness or fatigue, dizziness, fast or irregular heartbeat, increased sensitivity to cold or heat, excessive sweating, constipation, hair loss, increased thirst or amount of urine, tremors or shaking, irritability Infusion  reactions--chest pain, shortness of breath or trouble breathing, feeling faint or lightheaded Kidney injury (glomerulonephritis)--decrease in the amount of urine, red or dark brown urine, foamy or bubbly urine, swelling of the ankles, hands, or feet Liver injury--right upper belly pain, loss of appetite, nausea, light-colored stool, dark yellow or brown urine, yellowing skin or eyes, unusual weakness or fatigue Pain, tingling, or numbness in the hands or feet, muscle weakness, change in vision, confusion or trouble speaking, loss of balance or coordination, trouble walking, seizures Rash, fever, and swollen lymph nodes Redness, blistering, peeling, or loosening of the skin, including inside the mouth Sudden or severe stomach pain, bloody diarrhea, fever, nausea, vomiting Side effects that usually do not require medical attention (report these to your care team if they continue or are bothersome): Bone, joint, or muscle pain Diarrhea Fatigue Loss of appetite Nausea Skin rash This list may not describe all possible side effects. Call your doctor for medical advice about side effects. You may report side effects to FDA at 1-800-FDA-1088. Where should I keep my medication? This medication is given in a hospital or clinic and will not be stored at home. NOTE: This sheet is a summary. It may not cover all possible information. If you have questions about this medicine, talk to your doctor, pharmacist, or health care provider.  2024 [employer] (2022-06-06 00:00:00)

## 2022-10-29 ENCOUNTER — Other Ambulatory Visit: Payer: Self-pay

## 2022-10-30 ENCOUNTER — Other Ambulatory Visit: Payer: Self-pay | Admitting: Oncology

## 2022-10-30 DIAGNOSIS — I959 Hypotension, unspecified: Secondary | ICD-10-CM

## 2022-11-06 ENCOUNTER — Encounter: Payer: Self-pay | Admitting: Oncology

## 2022-11-06 ENCOUNTER — Encounter: Payer: Self-pay | Admitting: Hematology and Oncology

## 2022-11-08 NOTE — Progress Notes (Signed)
Patients grant through [facility] was reinstated.

## 2022-11-09 ENCOUNTER — Encounter: Payer: Self-pay | Admitting: Oncology

## 2022-11-15 NOTE — Progress Notes (Signed)
[facility]  [address] 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  He received 8 cycles with improvement in his skin lesions.  Unfortunately, he has had progressive worsening fatigue and debility and so we stopped his infusions in September but have now resumed them as of February, 2024.  The lesions of his skin fluctuate but overall are improved.   CLL (chronic lymphocytic leukemia) (HCC) We held off on treatment of his CLL due to multiple medical problems.  He had worsening anemia, requiring occasional transfusion.  This is likely due to his CLL, as he received IV iron his iron deficiency. He started acalabrutinib 100 mg twice daily for his CLL on July 21st, 2023. He has had an increase in his white blood cell count since starting acalabrutinib.  An intial increase in the lymphocytosis is common with acalabrutinib.  His white blood cell count has decreased somewhat from 222,000 to 212,000.  He has persistent anemia without definite nutritional deficiency, so this was felt to be due to his CLL.  He then reported black stool.  He was started on pantoprazole 40 mg daily.  He had worsening diarrhea and stool studies revealed enteropathogenic E. Coli, which was treated with ciprofloxacin.   He had persistent melena and stool Hemoccults x3 were positive, so alcalabrutinib was discontinued on September 15th, 2023.  His white blood cell count decreased to 109,900 and his hemoglobin improved to 9.2 on September 21. His WBC came down  to 15,000 by November, 2023. Since stopping treatment, his white count is rising again to 49,000, then 57,000 in January, 2024, 58,000 in February, and 68,000 in March. His WBC then leveled off for the last two checkups but is increased to 89,000 in June, 2024 but down to 77,700 in July, 2024.     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 He was transfused in August of 2023, when his hemoglobin dropped to 8 and he was very symptomatic. He last received IV iron replacement in June.  He required transfusion in August when his hemoglobin dropped to 8 and he was symptomatic. His hemoglobin has been better since December. It was up to 10.4 in February and went down to 9.4 it has now been 9.6 for the last two checks, up to 9.9 today.    Plan:  He stopped acalabrutinib for his CLL in September, 2023 and he is on observation only for his leukemia. He has more lesions on his face which look a little worse than before. His day 1 cycle 7 of Libtayo is scheduled for 11/18/2022. We will continue with his present treatment plan. He went to the ER last week for diarrhea and UTI and he will be referred to a gastroenterologist for evaluation of the chronic diarrhea. His hemoglobin is 10.1, WBC is 77.7, down from 89 last time and platelet count is 187,000 today. His CMP is fair, with a potassium of 3.3 today.  His blood sugar is 80 and I advised him to watch out for signs of  hypoglycemia. He will increase potassium in his diet. I will see him  back in 3 weeks with CBC and CMP, and he will have his next dose of Libtayo later that week. The patient and his daughter understand the plans discussed today and are in agreement with them.  They know to contact our office if he develops concerns prior to his next appointment.  I provided 27 minutes of face-to-face time during this this encounter and > 50% was spent counseling as documented under my assessment and plan.   Dellia Beckwith, MD  [facility] AT [facility] [address] [place] [address] Dept: [phone] Dept Fax: [phone]   No orders of the defined types were placed in this encounter.    CHIEF COMPLAINT:  CC:  Squamous cell skin cancers, CLL  Current Treatment: Libtayo  HISTORY OF PRESENT ILLNESS:   Oncology History  CLL (chronic lymphocytic leukemia) (HCC)  05/25/2015 Initial Diagnosis   CLL (chronic lymphocytic leukemia) (HCC)   02/04/2021 Cancer Staging   Staging form: Chronic Lymphocytic Leukemia / Small Lymphocytic Lymphoma, AJCC 8th Edition - Clinical stage from 02/04/2021: Modified Rai Stage III (Modified Rai risk: High, Binet: Stage A, Lymphocytosis: Present, Adenopathy: Absent, Organomegaly: Absent, Anemia: Present, Thrombocytopenia: Absent) - Signed by Dellia Beckwith, MD on 02/08/2021 Stage prefix: Recurrence Absolute lymphocyte count (ALC) (cells/uL): 100 Hemoglobin (Hgb) (g/dL): 13.0 Platelet count (x10E9/L of blood): 184   Cancer of the skin, basal cell  05/06/2021 Initial Diagnosis   Cancer of the skin, basal cell   08/30/2021 - 12/31/2021 Chemotherapy   Patient is on Treatment Plan : SQUAMOUS SKIN Cemiplimab q21d     08/30/2021 - 01/21/2022 Chemotherapy   Patient is on Treatment Plan : ADVANCED CUTANEOUS SQUAMOUS CELL CARCINOMA Cemiplimab q21d     07/15/2022 -  Chemotherapy   Patient is on Treatment Plan : ADVANCED CUTANEOUS SQUAMOUS CELL CARCINOMA Cemiplimab q21d     BCC (basal cell carcinoma), face  12/28/2016 Initial Diagnosis   BCC (basal cell carcinoma), face   07/15/2022 -  Chemotherapy   Patient is on Treatment Plan : ADVANCED CUTANEOUS SQUAMOUS CELL CARCINOMA Cemiplimab q21d        INTERVAL HISTORY:  Kelijah is here today for repeat clinical assessment after stopping acalabrutinib for his CLL in September, 2023. His CLL has just been very slowly progressive. Patient states that he feels fine and complains of chronic fatigue  and diarrhea. He has more lesions on his face which look a little worse than before. His day 1 cycle 7 of Libtayo is scheduled for 11/18/2022. We will continue with his present treatment plan. He went to the ER last week for diarrhea and UTI and he will be referred to a gastroenterologist for evaluation of the chronic diarrhea. His hemoglobin is 10.1, WBC is 77.7, down from 89 last time and platelet count is 187,000 today. His CMP is fair, with a potassium of 3.3 today.  His blood sugar is 80 and I advised him to watch out for signs of hypoglycemia. He will increase potassium in his diet. I will see him  back in 3 weeks with CBC and CMP, and he will have his next dose of Libtayo later that week He  denies signs of infections such as sore throat, sinus drainage, cough or urinary symptoms. He  denies fever or recurrent chills. He  also denies nausea, vomiting, chest pain dyspnea or cough. His  appetite is good and His  weight has decreased 1 pounds over last 3 weeks  He is accompanied by his daughter at today's visit as always.    REVIEW OF SYSTEMS:  Review of Systems  Constitutional:  Positive for fatigue. Negative for appetite change, chills, diaphoresis, fever and unexpected weight change.  HENT:  Negative.  Negative for hearing loss, lump/mass, mouth sores, nosebleeds, sore throat, tinnitus, trouble swallowing and voice change.   Eyes: Negative.  Negative for eye problems and icterus.  Respiratory: Negative.  Negative for chest tightness, cough, hemoptysis, shortness of breath and wheezing.   Cardiovascular: Negative.  Negative for chest pain, leg swelling and palpitations.  Gastrointestinal:  Positive for diarrhea. Negative for abdominal distention, abdominal pain, blood in stool, constipation, nausea, rectal pain and vomiting.  Endocrine: Negative.  Negative for hot flashes.  Genitourinary: Negative.  Negative for bladder incontinence, difficulty urinating, dyspareunia, dysuria, frequency, hematuria,  nocturia, pelvic pain and penile discharge.   Musculoskeletal:  Negative for arthralgias, back pain, flank pain, gait problem, myalgias, neck pain and neck stiffness.  Skin: Negative.  Negative for itching, rash and wound.       keratotic skin lesions w/ erythema  Neurological:  Positive for extremity weakness. Negative for dizziness, gait problem, headaches, light-headedness, numbness, seizures and speech difficulty.       Generalized weakness.  Hematological: Negative.  Negative for adenopathy. Does not bruise/bleed easily.  Psychiatric/Behavioral: Negative.  Negative for confusion, decreased concentration, depression, sleep disturbance and suicidal ideas. The patient is not nervous/anxious.      VITALS:  Blood pressure 117/66, pulse (!) 56, temperature 97.7 F (36.5 C), temperature source Oral, resp. rate 18, height 5\' 10"  (1.778 m), weight 150 lb 9.6 oz (68.3 kg), SpO2 99%.  Wt Readings from Last 3 Encounters:  11/16/22 150 lb 9.6 oz (68.3 kg)  10/28/22 151 lb (68.5 kg)  10/26/22 151 lb 12.8 oz (68.9 kg)    Body mass index is 21.61 kg/m.  Performance status (ECOG): 2 - Symptomatic, <50% confined to bed  PHYSICAL EXAM:  Physical Exam Vitals and nursing note reviewed. Exam conducted with a chaperone present.  Constitutional:      General: He is not in acute distress.    Appearance: Normal appearance. He is normal weight. He is not ill-appearing, toxic-appearing or diaphoretic.  HENT:     Head: Normocephalic and atraumatic.     Comments: Raised hemorraghic area on the left side of the nose, and a smaller are on the right. Scaly areas on the left maxillary area Otherwise face is improved.  Keratotic areas on the ears but improved.       Right Ear: Tympanic membrane, ear canal and external ear normal. There is no impacted cerumen.     Left Ear: Tympanic membrane, ear canal and external ear normal. There is no impacted cerumen.     Nose: Nose normal. No congestion or rhinorrhea.      Mouth/Throat:     Mouth: Mucous membranes are moist.     Pharynx: Oropharynx is clear. No oropharyngeal exudate or posterior oropharyngeal erythema.  Eyes:     General: No scleral icterus.       Right eye: No discharge.        Left eye: No discharge.     Extraocular Movements: Extraocular movements intact.     Conjunctiva/sclera: Conjunctivae normal.     Pupils: Pupils are equal, round, and reactive to light.  Neck:     Vascular: No carotid bruit.  Cardiovascular:     Rate  and Rhythm: Normal rate and regular rhythm.     Pulses: Normal pulses.     Heart sounds: Murmur heard.     Systolic murmur is present with a grade of 2/6.     No friction rub. No gallop.  Pulmonary:     Effort: Pulmonary effort is normal. No respiratory distress.     Breath sounds: Normal breath sounds. No stridor. No wheezing, rhonchi or rales.  Chest:     Chest wall: No tenderness.  Abdominal:     General: Bowel sounds are normal. There is no distension.     Palpations: Abdomen is soft. There is no hepatomegaly, splenomegaly or mass.     Tenderness: There is no abdominal tenderness. There is no right CVA tenderness, left CVA tenderness, guarding or rebound.     Hernia: No hernia is present.  Musculoskeletal:        General: No swelling, tenderness, deformity or signs of injury. Normal range of motion.     Cervical back: Normal range of motion and neck supple. No rigidity or tenderness.     Right lower leg: 1+ Edema present.     Left lower leg: 1+ Edema present.     Comments: 1+ edema with some reaction to the anterior tibia.  Scattered skin cancers    Lymphadenopathy:     Cervical: No cervical adenopathy.     Upper Body:     Right upper body: No supraclavicular or axillary adenopathy.     Left upper body: No supraclavicular or axillary adenopathy.  Skin:    General: Skin is warm and dry.     Coloration: Skin is not jaundiced or pale.     Findings: Lesion present. No bruising, erythema or rash.      Comments: Multiple areas of erythema of the skin on the scalp, face, ears, and dorsum of the left hand, anterior tibia associate with keratotic lesions and probable skin cancers.  Overall the face is significantly improved except for 1 hemorrhagic raised area on the left side of the nose.      Neurological:     General: No focal deficit present.     Mental Status: He is alert and oriented to person, place, and time. Mental status is at baseline.     Cranial Nerves: No cranial nerve deficit.     Sensory: No sensory deficit.     Motor: No weakness.     Coordination: Coordination normal.     Gait: Gait normal.     Deep Tendon Reflexes: Reflexes normal.  Psychiatric:        Mood and Affect: Mood normal.        Behavior: Behavior normal.        Thought Content: Thought content normal.        Judgment: Judgment normal.     LABS:      Latest Ref Rng & Units 11/16/2022   12:00 AM 10/26/2022   12:00 AM 10/05/2022   12:00 AM  CBC  WBC  77.7     89.1     86.3      Hemoglobin 13.5 - 17.5 10.1     9.9     9.5      Hematocrit 41 - 53 32     32     31      Platelets 150 - 400 K/uL 187     194     208         This result is  from an external source.      Latest Ref Rng & Units 11/16/2022   12:00 AM 10/26/2022   12:00 AM 10/05/2022    2:46 PM  CMP  Glucose 70 - 99 mg/dL   161   BUN 4 - 21 24     26     21    Creatinine 0.6 - 1.3 0.8     0.9     0.93   Sodium 137 - 147 140     139     139   Potassium 3.5 - 5.1 mEq/L 3.3     4.1     3.6   Chloride 99 - 108 110     106     107   CO2 13 - 22 27     23     23    Calcium 8.7 - 10.7 8.8     8.4     8.2   Total Protein 6.5 - 8.1 g/dL   5.6   Total Bilirubin 0.3 - 1.2 mg/dL   0.4   Alkaline Phos 25 - 125 61     53     48   AST 14 - 40 29     28     21    ALT 10 - 40 U/L 20     18     20       This result is from an external source.   Component Ref Range & Units 09/14/2022 9 mo ago  Folate >5.9 ng/mL 29.0 VC 15.8 CM     Component Ref  Range & Units 09/14/2022 9 mo ago  Transferrin Receptor 12.2 - 27.3 nmol/L 18.4 11.3 Low  CM   Component Ref Range & Units 09/14/2022 9 mo ago  Vitamin B-12 180 - 914 pg/mL 547 493 CM              Component Ref Range & Units 2 wk ago (10/26/22) 2 mo ago (08/24/22) 4 mo ago (07/08/22) 7 mo ago (04/14/22) 8 mo ago (03/03/22) 9 mo ago (02/10/22) 9 mo ago (01/20/22)  TSH 0.350 - 4.500 uIU/mL 1.888 1.955 CM 1.748 CM 2.417 CM 2.615 CM 1.543 CM 1.544 CM                Component Ref Range & Units 10/26/2022 2 mo ago 4 mo ago 7 mo ago 8 mo ago 9 mo ago 10 mo ago  T4, Total 4.5 - 12.0 ug/dL 7.6 9.2 CM 8.0 CM 8.7 CM 8.1 CM 8.3 CM 9.4 CM     No results found for: "CEA1", "CEA" / No results found for: "CEA1", "CEA" No results found for: "PSA1" No results found for: "WRU045" No results found for: "CAN125"  No results found for: "TOTALPROTELP", "ALBUMINELP", "A1GS", "A2GS", "BETS", "BETA2SER", "GAMS", "MSPIKE", "SPEI" Lab Results  Component Value Date   TIBC 301 09/14/2022   TIBC 267 12/31/2021   TIBC 291 09/15/2021   FERRITIN 59 09/14/2022   FERRITIN 248 12/31/2021   FERRITIN 48 09/15/2021   IRONPCTSAT 13 (L) 09/14/2022   IRONPCTSAT 22 12/31/2021   IRONPCTSAT 13 (L) 09/15/2021   Lab Results  Component Value Date   LDH 166 08/03/2020    STUDIES:  No results found.    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 He has never used smokeless tobacco. He reports that he does not currently use alcohol. He reports that he does not use drugs.The patient is accompanied by his daughter today.  Allergies: No Known Allergies  Current Medications: Current Outpatient Medications  Medication Sig Dispense Refill   acetaminophen (TYLENOL) 500 MG tablet Take 1,000 mg by mouth every 6 (six) hours as needed for mild pain, moderate pain or fever.     amiodarone (PACERONE) 200 MG tablet Take 200 mg by mouth daily.     apixaban (ELIQUIS) 5 MG TABS tablet Take 1 tablet by mouth 2 (two) times daily.     Ascorbic Acid (VITAMIN C) 500 MG CAPS Take 500 mg by mouth daily.     benazepril (LOTENSIN) 20 MG tablet Take 20 mg by mouth daily.     bismuth subsalicylate (PEPTO BISMOL) 262 MG/15ML suspension Take 30 mLs by mouth every 6 (six) hours as needed. For constipation     cefdinir (OMNICEF) 300 MG capsule Take 300 mg by mouth 2 (two) times daily.     cholecalciferol (VITAMIN D3) 25 MCG (1000 UNIT) tablet Take 1,000 Units by mouth daily.     cholestyramine (QUESTRAN) 4 g packet Take 1 packet by mouth 3 (three) times daily.     doxycycline (VIBRA-TABS) 100 MG tablet Take 100 mg by mouth daily.     furosemide (LASIX) 40 MG tablet Take 0.5 tablets (20 mg total) by mouth daily. 30 tablet 0   ketoconazole (NIZORAL) 2 % shampoo Apply topically 3 (three) times a week. (Patient not taking: Reported on 10/05/2022)     levothyroxine (SYNTHROID) 75 MCG tablet Take 75 mcg by mouth daily before breakfast.     metFORMIN (GLUCOPHAGE-XR) 500 MG 24 hr tablet Take 500 mg by mouth 2 (two) times daily.     midodrine (PROAMATINE) 5 MG tablet TAKE 1 TABLET (5 MG TOTAL) BY MOUTH 3 (THREE) TIMES DAILY WITH MEALS. 270 tablet 1   mupirocin ointment (BACTROBAN) 2 % Apply topically daily.     ofloxacin (OCUFLOX) 0.3 % ophthalmic solution 1 drop 4 (four) times daily.     Omega-3 Fatty Acids (FISH OIL)  1000 MG CAPS Take 1,000 mg by mouth daily.     ondansetron (ZOFRAN) 4 MG tablet Take 1 tablet (4 mg total) by mouth every 6 (six) hours as needed for nausea. 20 tablet 0   pantoprazole (PROTONIX) 40 MG tablet TAKE 1 TABLET BY MOUTH EVERY DAY (Patient not taking: Reported on 10/05/2022) 90 tablet 1   polyethylene glycol (MIRALAX / GLYCOLAX) 17 g packet Take 17 g by mouth daily as needed for mild constipation. 14 each 0   rOPINIRole (REQUIP) 0.5 MG tablet Take 0.5-1 mg by mouth See admin instructions. Taking one tablet at bedtime, if no relief take an additional tablet     rosuvastatin (CRESTOR) 20 MG tablet Take 10 mg by mouth daily.     vitamin B-12 (CYANOCOBALAMIN) 500 MCG tablet Take  500 mcg by mouth daily.     Current Facility-Administered Medications  Medication Dose Route Frequency Provider Last Rate Last Admin   sodium chloride flush (NS) 0.9 % injection 10 mL  10 mL Intracatheter PRN Mosher, Harvin Hazel A, PA-C   10 mL at 11/16/22 1609      I,Oluwatobi Asade,acting as a scribe for Dellia Beckwith, MD.,have documented all relevant documentation on the behalf of Dellia Beckwith, MD,as directed by  Dellia Beckwith, MD while in the presence of Dellia Beckwith, MD.

## 2022-11-16 ENCOUNTER — Encounter: Payer: Self-pay | Admitting: Oncology

## 2022-11-16 ENCOUNTER — Inpatient Hospital Stay: Payer: Medicare Other | Attending: Oncology | Admitting: Oncology

## 2022-11-16 ENCOUNTER — Inpatient Hospital Stay: Payer: Medicare Other

## 2022-11-16 VITALS — BP 117/66 | HR 56 | Temp 97.7°F | Resp 18 | Ht 70.0 in | Wt 150.6 lb

## 2022-11-16 DIAGNOSIS — C44310 Basal cell carcinoma of skin of unspecified parts of face: Secondary | ICD-10-CM

## 2022-11-16 DIAGNOSIS — Z5112 Encounter for antineoplastic immunotherapy: Secondary | ICD-10-CM | POA: Insufficient documentation

## 2022-11-16 DIAGNOSIS — Z79620 Long term (current) use of immunosuppressive biologic: Secondary | ICD-10-CM | POA: Diagnosis not present

## 2022-11-16 DIAGNOSIS — D509 Iron deficiency anemia, unspecified: Secondary | ICD-10-CM

## 2022-11-16 DIAGNOSIS — C4491 Basal cell carcinoma of skin, unspecified: Secondary | ICD-10-CM | POA: Diagnosis present

## 2022-11-16 DIAGNOSIS — Z452 Encounter for adjustment and management of vascular access device: Secondary | ICD-10-CM | POA: Diagnosis not present

## 2022-11-16 DIAGNOSIS — C9110 Chronic lymphocytic leukemia of B-cell type not having achieved remission: Secondary | ICD-10-CM | POA: Diagnosis present

## 2022-11-16 DIAGNOSIS — C4440 Unspecified malignant neoplasm of skin of scalp and neck: Secondary | ICD-10-CM

## 2022-11-16 LAB — CBC AND DIFFERENTIAL
HCT: 32 — AB (ref 41–53)
Hemoglobin: 10.1 — AB (ref 13.5–17.5)
Platelets: 187 10*3/uL (ref 150–400)
WBC: 77.7

## 2022-11-16 LAB — CBC W DIFFERENTIAL (~~LOC~~ CC SCANNED REPORT)

## 2022-11-16 LAB — BASIC METABOLIC PANEL
BUN: 24 — AB (ref 4–21)
CO2: 27 — AB (ref 13–22)
Chloride: 110 — AB (ref 99–108)
Creatinine: 0.8 (ref 0.6–1.3)
Glucose: 80
Potassium: 3.3 mEq/L — AB (ref 3.5–5.1)
Sodium: 140 (ref 137–147)

## 2022-11-16 LAB — HEPATIC FUNCTION PANEL
ALT: 20 U/L (ref 10–40)
AST: 29 (ref 14–40)
Alkaline Phosphatase: 61 (ref 25–125)
Bilirubin, Total: 0.4

## 2022-11-16 LAB — COMPREHENSIVE METABOLIC PANEL (ASHBORO CC SCANNED REPORT)

## 2022-11-16 LAB — TSH: TSH: 1.474 u[IU]/mL (ref 0.350–4.500)

## 2022-11-16 LAB — CBC: RBC: 3.49 — AB (ref 3.87–5.11)

## 2022-11-16 LAB — COMPREHENSIVE METABOLIC PANEL
Albumin: 3.3 — AB (ref 3.5–5.0)
Calcium: 8.8 (ref 8.7–10.7)

## 2022-11-16 MED ORDER — SODIUM CHLORIDE 0.9% FLUSH
10.0000 mL | INTRAVENOUS | Status: DC | PRN
  Administered 2022-11-16: 10 mL

## 2022-11-16 MED ORDER — HEPARIN SOD (PORK) LOCK FLUSH 100 UNIT/ML IV SOLN
500.0000 [IU] | Freq: Once | INTRAVENOUS | Status: AC | PRN
  Administered 2022-11-16: 500 [IU]

## 2022-11-16 NOTE — Progress Notes (Signed)
CRITICAL VALUE STICKER  CRITICAL VALUE:  WBC 77.7  RECEIVER (on-site recipient of call):  Dyane Dustman RN   DATE & TIME NOTIFIED:   11/16/2022 @ 1626  MESSENGER (representative from lab):  [facility]anda [facility] lab  MD NOTIFIED:   Dr. Gilman Buttner  TIME OF NOTIFICATION:  1630  RESPONSE: Scheduled to see patient.

## 2022-11-17 LAB — T4: T4, Total: 8.2 ug/dL (ref 4.5–12.0)

## 2022-11-18 ENCOUNTER — Inpatient Hospital Stay: Payer: Medicare Other

## 2022-11-18 VITALS — BP 109/61 | HR 61 | Temp 98.0°F | Resp 18

## 2022-11-18 DIAGNOSIS — C4491 Basal cell carcinoma of skin, unspecified: Secondary | ICD-10-CM

## 2022-11-18 DIAGNOSIS — C44310 Basal cell carcinoma of skin of unspecified parts of face: Secondary | ICD-10-CM

## 2022-11-18 DIAGNOSIS — Z5112 Encounter for antineoplastic immunotherapy: Secondary | ICD-10-CM | POA: Diagnosis not present

## 2022-11-18 MED ORDER — HEPARIN SOD (PORK) LOCK FLUSH 100 UNIT/ML IV SOLN
500.0000 [IU] | Freq: Once | INTRAVENOUS | Status: AC | PRN
  Administered 2022-11-18: 500 [IU]

## 2022-11-18 MED ORDER — SODIUM CHLORIDE 0.9 % IV SOLN: Freq: Once | INTRAVENOUS | Status: AC

## 2022-11-18 MED ORDER — SODIUM CHLORIDE 0.9 % IV SOLN
350.0000 mg | Freq: Once | INTRAVENOUS | Status: AC
  Administered 2022-11-18: 350 mg via INTRAVENOUS
  Filled 2022-11-18: qty 7

## 2022-11-18 MED ORDER — SODIUM CHLORIDE 0.9% FLUSH
10.0000 mL | INTRAVENOUS | Status: DC | PRN
  Administered 2022-11-18: 10 mL

## 2022-11-18 NOTE — Patient Instructions (Signed)
Cemiplimab Injection What is this medication? CEMIPLIMAB (se MIP li mab) treats skin cancer and lung cancer. It works by helping your immune system slow or stop the spread of cancer cells. It is a monoclonal antibody. This medicine may be used for other purposes; ask your health care provider or pharmacist if you have questions. COMMON BRAND NAME(S): LIBTAYO What should I tell my care team before I take this medication? They need to know if you have any of these conditions: Allogeneic stem cell transplant (uses someone else's stem cells) Autoimmune diseases, such as Crohn disease, ulcerative colitis, lupus History of chest radiation Nervous system problems, such as Guillain-Barre syndrome or myasthenia gravis Organ transplant An unusual or allergic reaction to cemiplimab, other medications, foods, dyes, or preservatives Pregnant or trying to get pregnant Breastfeeding How should I use this medication? This medication is infused into a vein. It is given by your care team in a hospital or clinic setting. A special MedGuide will be given to you before each treatment. Be sure to read this information carefully each time. Talk to your care team about the use of this medication in children. Special care may be needed. Overdosage: If you think you have taken too much of this medicine contact a poison control center or emergency room at once. NOTE: This medicine is only for you. Do not share this medicine with others. What if I miss a dose? Keep appointments for follow-up doses. It is important not to miss your dose. Call your care team if you are unable to keep an appointment. What may interact with this medication? Interactions have not been studied. This list may not describe all possible interactions. Give your health care provider a list of all the medicines, herbs, non-prescription drugs, or dietary supplements you use. Also tell them if you smoke, drink alcohol, or use illegal drugs. Some  items may interact with your medicine. What should I watch for while using this medication? Visit your care team for regular checks on your progress. It may be some time before you see the benefit from this medication. You may need blood work done while taking this medication. This medication may cause serious skin reactions. They can happen weeks to months after starting the medication. Contact your care team right away if you notice fevers or flu-like symptoms with a rash. The rash may be red or purple and then turn into blisters or peeling of the skin. You may also notice a red rash with swelling of the face, lips, or lymph nodes in your neck or under your arms. Tell your care team right away if you have any change in your eyesight. Talk to your care team if you may be pregnant. You will need a negative pregnancy test before starting this medication. Contraception is recommended while taking this medication and for 4 months after the last dose. Your care team can help you find the option that works for you. Do not breastfeed while taking this medication and for at least 4 months after the last dose. What side effects may I notice from receiving this medication? Side effects that you should report to your care team as soon as possible: Allergic reactions--skin rash, itching, hives, swelling of the face, lips, tongue, or throat Dry cough, shortness of breath or trouble breathing Eye pain, redness, irritation, or discharge with blurry or decreased vision Heart muscle inflammation--unusual weakness or fatigue, shortness of breath, chest pain, fast or irregular heartbeat, dizziness, swelling of the ankles, feet, or hands  Hormone gland problems--headache, sensitivity to light, unusual weakness or fatigue, dizziness, fast or irregular heartbeat, increased sensitivity to cold or heat, excessive sweating, constipation, hair loss, increased thirst or amount of urine, tremors or shaking, irritability Infusion  reactions--chest pain, shortness of breath or trouble breathing, feeling faint or lightheaded Kidney injury (glomerulonephritis)--decrease in the amount of urine, red or dark brown urine, foamy or bubbly urine, swelling of the ankles, hands, or feet Liver injury--right upper belly pain, loss of appetite, nausea, light-colored stool, dark yellow or brown urine, yellowing skin or eyes, unusual weakness or fatigue Pain, tingling, or numbness in the hands or feet, muscle weakness, change in vision, confusion or trouble speaking, loss of balance or coordination, trouble walking, seizures Rash, fever, and swollen lymph nodes Redness, blistering, peeling, or loosening of the skin, including inside the mouth Sudden or severe stomach pain, bloody diarrhea, fever, nausea, vomiting Side effects that usually do not require medical attention (report these to your care team if they continue or are bothersome): Bone, joint, or muscle pain Diarrhea Fatigue Loss of appetite Nausea Skin rash This list may not describe all possible side effects. Call your doctor for medical advice about side effects. You may report side effects to FDA at 1-800-FDA-1088. Where should I keep my medication? This medication is given in a hospital or clinic and will not be stored at home. NOTE: This sheet is a summary. It may not cover all possible information. If you have questions about this medicine, talk to your doctor, pharmacist, or health care provider.  2024 [employer] (2022-06-06 00:00:00)

## 2022-11-29 ENCOUNTER — Encounter: Payer: Self-pay | Admitting: Oncology

## 2022-11-30 ENCOUNTER — Encounter: Payer: Self-pay | Admitting: Oncology

## 2022-11-30 ENCOUNTER — Encounter: Payer: Self-pay | Admitting: Hematology and Oncology

## 2022-12-06 NOTE — Progress Notes (Signed)
[facility]  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  He received 8 cycles with improvement in his skin lesions.  Unfortunately, he has had progressive worsening fatigue and debility and so we stopped his infusions in September but have now resumed them as of February, 2024.  The lesions of his skin fluctuate but seem worse today.   CLL (chronic lymphocytic leukemia) (HCC) We held off on treatment of his CLL due to multiple medical problems.  He had worsening anemia, requiring occasional transfusion.  This is likely due to his CLL, as he received IV iron his iron deficiency. He started acalabrutinib 100 mg twice daily for his CLL on July 21st, 2023. He has had an increase in his white blood cell count since starting acalabrutinib.  An intial increase in the lymphocytosis is common with acalabrutinib.  His white blood cell count has decreased somewhat from 222,000 to 212,000.  He has persistent anemia without definite nutritional deficiency, so this was felt to be due to his CLL.  He then reported black stool.  He was started on pantoprazole 40 mg daily.  He had worsening diarrhea and stool studies revealed enteropathogenic E. Coli, which was treated with ciprofloxacin.   He had persistent melena and stool Hemoccults x3 were positive, so alcalabrutinib was discontinued on September 15th, 2023.  His white blood cell count decreased to 109,900 and his hemoglobin improved to 9.2 on September 21. His WBC came down to  15,000 by November, 2023. Since stopping treatment, his white count is rising again to 49,000, then 57,000 in January, 2024, 58,000 in February, and 68,000 in March. His WBC then leveled off for the last two checkups but is increased to 89,000 in June, 2024 but down to 77,700 in July, 2024 and remains stable at 78,000 today.     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 He was transfused in August of 2023, when his hemoglobin dropped to 8 and he was very symptomatic. He last received IV iron replacement in June.  He required transfusion in August when his hemoglobin dropped to 8 and he was symptomatic. His hemoglobin has been better since December. It was up to 10.4 in February and went down to 9.4 it has now been 9.6 and he has slowly improved by a small amount with every visit.    Plan:  The lesions on his face have gotten worse. He has an appointment with the dermatologist in 2 weeks. 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 Marland Kitchen His daughter asked if his skin cancer will get worse if he stops the treatment completely and I told her that I am not sure. I do not think the infusions are causing any harm to him and last month he had definite improvement.  I recommend that he continue with his infusions for now, and he agreed to this, but is discouraged by his many disabilities. His day 1 cycle 8 of Libtayo is scheduled for  12/09/2022. His hemoglobin is 10.3, WBC is 78,000 and platelet count is 191,000 today. His CMP is pending.  He is worried about his bills as the payment from the grant has not been made, and the staff is working on this.  I will see him  back in 3 weeks with CBC and CMP.  The patient and his daughter understand the plans discussed today and are in agreement with them.  They know to contact our office if he develops concerns prior to his next appointment.   I provided 20 minutes of face-to-face time during this this encounter  and > 50% was spent counseling as documented under my assessment and plan.   Dellia Beckwith, MD  [facility] [address] [place] [address] Dept: [phone] Dept Fax: [phone]   No orders of the defined types were placed in this encounter.    CHIEF COMPLAINT:  CC:  Squamous cell skin cancers, CLL  Current Treatment: Libtayo  HISTORY OF PRESENT ILLNESS:   Oncology History  CLL (chronic lymphocytic leukemia) (HCC)  05/25/2015 Initial Diagnosis   CLL (chronic lymphocytic leukemia) (HCC)   02/04/2021 Cancer Staging   Staging form: Chronic Lymphocytic Leukemia / Small Lymphocytic Lymphoma, AJCC 8th Edition - Clinical stage from 02/04/2021: Modified Rai Stage III (Modified Rai risk: High, Binet: Stage A, Lymphocytosis: Present, Adenopathy: Absent, Organomegaly: Absent, Anemia: Present, Thrombocytopenia: Absent) - Signed by Dellia Beckwith, MD on 02/08/2021 Stage prefix: Recurrence Absolute lymphocyte count (ALC) (cells/uL): 100 Hemoglobin (Hgb) (g/dL): 13.2 Platelet count (x10E9/L of blood): 184   Cancer of the skin, basal cell  05/06/2021 Initial Diagnosis   Cancer of the skin, basal cell   08/30/2021 - 12/31/2021 Chemotherapy   Patient is on Treatment Plan : SQUAMOUS SKIN Cemiplimab q21d     08/30/2021 - 01/21/2022 Chemotherapy   Patient is on Treatment Plan : ADVANCED CUTANEOUS SQUAMOUS CELL CARCINOMA Cemiplimab q21d     07/15/2022 -  Chemotherapy   Patient is on Treatment Plan : ADVANCED CUTANEOUS SQUAMOUS CELL CARCINOMA Cemiplimab q21d     BCC (basal cell carcinoma), face  12/28/2016 Initial Diagnosis   BCC (basal cell carcinoma), face   07/15/2022 -  Chemotherapy   Patient is on Treatment Plan : ADVANCED CUTANEOUS SQUAMOUS CELL CARCINOMA Cemiplimab q21d        INTERVAL HISTORY:  Fernando Young is here today for repeat clinical assessment after stopping acalabrutinib for his CLL in September, 2023. His  CLL has just been very slowly progressive. He also has Squamous cell skin cancers. Patient states that he feels tired most of the time. The lesions on his face have gotten worse. He has an appointment wit the dermatologist in 2 weeks. 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 Marland Kitchen His daughter asked if his skin cancer will worsen if he stops the treatment completely and I told her that I am not sure. I do not think the infusions are causing any harm to him and I recommend that he should continue with his infusions for now, and he agreed to this, but is discouraged. His day 1 cycle 8 of Libtayo is scheduled for 12/09/2022. His hemoglobin is 10.3, WBC is 78,000 and platelet count is 191,000 today. His CMP is pending.  He is worried about his bills as the payment from the grant has not been made, and the staff is working on that.  I will see him  back in 3 weeks with CBC and CMP. He  denies signs of infections such as sore throat, sinus drainage, cough or urinary symptoms. He  denies fever or recurrent chills. He  also deny nausea, vomiting, chest pain, or dyspnea. His  appetite is alright and His  weight has increased 1 pounds over last 3 weeks  He is accompanied by his daughter at today's visit as always.     REVIEW OF SYSTEMS:  Review of Systems  Constitutional:  Positive for fatigue. Negative for appetite change, chills, diaphoresis, fever and unexpected weight change.  HENT:  Negative.  Negative for hearing loss, lump/mass, mouth sores, nosebleeds, sore throat, tinnitus, trouble swallowing and voice change.   Eyes: Negative.  Negative for eye problems and icterus.  Respiratory: Negative.  Negative for chest tightness, cough, hemoptysis, shortness of breath and wheezing.   Cardiovascular: Negative.  Negative for chest pain, leg swelling and palpitations.  Gastrointestinal:  Positive for diarrhea. Negative for abdominal distention, abdominal pain, blood in stool,  constipation, nausea, rectal pain and vomiting.  Endocrine: Negative.  Negative for hot flashes.  Genitourinary: Negative.  Negative for bladder incontinence, difficulty urinating, dyspareunia, dysuria, frequency, hematuria, nocturia, pelvic pain and penile discharge.   Musculoskeletal:  Negative for arthralgias, back pain, flank pain, gait problem, myalgias, neck pain and neck stiffness.  Skin: Negative.  Negative for itching, rash and wound.       keratotic skin lesions w/ erythema which are worsening this month  Neurological:  Positive for extremity weakness. Negative for dizziness, gait problem, headaches, light-headedness, numbness, seizures and speech difficulty.       Generalized weakness.  Hematological: Negative.  Negative for adenopathy. Does not bruise/bleed easily.  Psychiatric/Behavioral: Negative.  Negative for confusion, decreased concentration, depression, sleep disturbance and suicidal ideas. The patient is not nervous/anxious.      VITALS:  Blood pressure (!) 86/54, pulse 62, temperature 98.4 F (36.9 C), temperature source Oral, resp. rate 18, height 5\' 10"  (1.778 m), weight 151 lb 1.6 oz (68.5 kg), SpO2 98%.  Wt Readings from Last 3 Encounters:  12/07/22 151 lb 1.6 oz (68.5 kg)  11/16/22 150 lb 9.6 oz (68.3 kg)  10/28/22 151 lb (68.5 kg)    Body mass index is 21.68 kg/m.  Performance status (ECOG): 2 - Symptomatic, <50% confined to bed  PHYSICAL EXAM:  Physical Exam Vitals and nursing note reviewed. Exam conducted with a chaperone present.  Constitutional:      General: He is not in acute distress.    Appearance: Normal appearance. He is normal weight. He is not ill-appearing, toxic-appearing or diaphoretic.  HENT:     Head: Normocephalic and atraumatic.     Comments: Raised hard scaly area on the left side of the nose, and a smaller area on the right. Scaly areas on the left maxillary area Keratotic areas on the ears but improved.     Right Ear: Tympanic  membrane, ear canal and external ear normal. There is no impacted cerumen.     Left Ear: Tympanic membrane, ear canal and external ear normal. There is no impacted cerumen.     Nose: Nose normal. No congestion or rhinorrhea.     Mouth/Throat:     Mouth: Mucous membranes are moist.     Pharynx: Oropharynx is clear. No oropharyngeal exudate or posterior oropharyngeal erythema.  Eyes:     General: No scleral icterus.       Right eye: No discharge.        Left  eye: No discharge.     Extraocular Movements: Extraocular movements intact.     Conjunctiva/sclera: Conjunctivae normal.     Pupils: Pupils are equal, round, and reactive to light.  Neck:     Vascular: No carotid bruit.  Cardiovascular:     Rate and Rhythm: Normal rate and regular rhythm.     Pulses: Normal pulses.     Heart sounds: Murmur heard.     Systolic murmur is present with a grade of 2/6.     No friction rub. No gallop.  Pulmonary:     Effort: Pulmonary effort is normal. No respiratory distress.     Breath sounds: Normal breath sounds. No stridor. No wheezing, rhonchi or rales.  Chest:     Chest wall: No tenderness.  Abdominal:     General: Bowel sounds are normal. There is no distension.     Palpations: Abdomen is soft. There is no hepatomegaly, splenomegaly or mass.     Tenderness: There is no abdominal tenderness. There is no right CVA tenderness, left CVA tenderness, guarding or rebound.     Hernia: No hernia is present.  Musculoskeletal:        General: No swelling, tenderness, deformity or signs of injury. Normal range of motion.     Cervical back: Normal range of motion and neck supple. No rigidity or tenderness.     Right lower leg: 1+ Edema present.     Left lower leg: 1+ Edema present.     Comments: 1+ edema with some reaction to the anterior tibia.  Scattered skin cancers  Evidence of venous stasis.  Lymphadenopathy:     Cervical: No cervical adenopathy.     Upper Body:     Right upper body: No  supraclavicular or axillary adenopathy.     Left upper body: No supraclavicular or axillary adenopathy.  Skin:    General: Skin is warm and dry.     Coloration: Skin is not jaundiced or pale.     Findings: Lesion present. No bruising, erythema or rash.     Comments: He has several hemorrhagic raised lesions on both sides of his nose. Multiple skin cancers consistent with squamous  cell carcinoma of his cheek, nose, and neck. He has a mole on the upper pinna of the left ear.  He has some lesions on the dorsum of his left hand   Neurological:     General: No focal deficit present.     Mental Status: He is alert and oriented to person, place, and time. Mental status is at baseline.     Cranial Nerves: No cranial nerve deficit.     Sensory: No sensory deficit.     Motor: No weakness.     Coordination: Coordination normal.     Gait: Gait normal.     Deep Tendon Reflexes: Reflexes normal.  Psychiatric:        Mood and Affect: Mood normal.        Behavior: Behavior normal.        Thought Content: Thought content normal.        Judgment: Judgment normal.     LABS:      Latest Ref Rng & Units 11/16/2022   12:00 AM 10/26/2022   12:00 AM 10/05/2022   12:00 AM  CBC  WBC  77.7     89.1     86.3      Hemoglobin 13.5 - 17.5 10.1     9.9     9.5  Hematocrit 41 - 53 32     32     31      Platelets 150 - 400 K/uL 187     194     208         This result is from an external source.      Latest Ref Rng & Units 11/16/2022   12:00 AM 10/26/2022   12:00 AM 10/05/2022    2:46 PM  CMP  Glucose 70 - 99 mg/dL   010   BUN 4 - 21 24     26     21    Creatinine 0.6 - 1.3 0.8     0.9     0.93   Sodium 137 - 147 140     139     139   Potassium 3.5 - 5.1 mEq/L 3.3     4.1     3.6   Chloride 99 - 108 110     106     107   CO2 13 - 22 27     23     23    Calcium 8.7 - 10.7 8.8     8.4     8.2   Total Protein 6.5 - 8.1 g/dL   5.6   Total Bilirubin 0.3 - 1.2 mg/dL   0.4   Alkaline Phos 25 - 125 61      53     48   AST 14 - 40 29     28     21    ALT 10 - 40 U/L 20     18     20       This result is from an external source.   Component Ref Range & Units 09/14/2022 9 mo ago  Folate >5.9 ng/mL 29.0 VC 15.8 CM     Component Ref Range & Units 09/14/2022 9 mo ago  Transferrin Receptor 12.2 - 27.3 nmol/L 18.4 11.3 Low  CM   Component Ref Range & Units 09/14/2022 9 mo ago  Vitamin B-12 180 - 914 pg/mL 547 493 CM              Component Ref Range & Units 2 wk ago (11/16/22) 1 mo ago (10/26/22) 3 mo ago (08/24/22) 5 mo ago (07/08/22) 7 mo ago (04/14/22) 9 mo ago (03/03/22) 9 mo ago (02/10/22)  TSH 0.350 - 4.500 uIU/mL 1.474 1.888 CM 1.955 CM 1.748 CM 2.417 CM 2.615 CM 1.543 CM                  Component Ref Range & Units 2 wk ago (11/16/22) 1 mo ago (10/26/22) 3 mo ago (08/24/22) 5 mo ago (07/08/22) 7 mo ago (04/14/22) 9 mo ago (03/03/22) 9 mo ago (02/10/22)  T4, Total 4.5 - 12.0 ug/dL 8.2 7.6 CM 9.2 CM 8.0 CM 8.7 CM 8.1 CM 8.3 CM       No results found for: "CEA1", "CEA" / No results found for: "CEA1", "CEA" No results found for: "PSA1" No results found for: "UVO536" No results found for: "CAN125"  No results found for: "TOTALPROTELP", "ALBUMINELP", "A1GS", "A2GS", "BETS", "BETA2SER", "GAMS", "MSPIKE", "SPEI" Lab Results  Component Value Date   TIBC 301 09/14/2022   TIBC 267 12/31/2021   TIBC 291 09/15/2021   FERRITIN 59 09/14/2022   FERRITIN 248 12/31/2021   FERRITIN 48 09/15/2021   IRONPCTSAT 13 (L) 09/14/2022   IRONPCTSAT 22 12/31/2021   IRONPCTSAT 13 (L) 09/15/2021   Lab  Results  Component Value Date   LDH 166 08/03/2020    STUDIES:  No results found.    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 He has never used smokeless tobacco. He reports that he does not currently use alcohol. He reports that he does not use drugs.The patient is accompanied by his daughter today.  Allergies: No Known Allergies  Current Medications: Current Outpatient Medications  Medication Sig Dispense Refill   diphenoxylate-atropine (LOMOTIL) 2.5-0.025 MG tablet Take by mouth.     acetaminophen (TYLENOL) 500 MG tablet Take 1,000 mg by mouth every 6 (six) hours as needed for mild pain, moderate pain or fever.     amiodarone (PACERONE) 200 MG tablet Take 200 mg by mouth daily.     apixaban (ELIQUIS) 5 MG TABS tablet Take 1 tablet by mouth 2 (two) times daily.     Ascorbic Acid (VITAMIN C) 500 MG CAPS Take 500 mg by mouth daily.     benazepril (LOTENSIN) 20 MG tablet Take 20 mg by mouth daily.     bismuth subsalicylate (PEPTO BISMOL) 262 MG/15ML suspension Take 30 mLs by mouth every 6 (six) hours as needed. For constipation     cefdinir (OMNICEF) 300 MG capsule Take 300 mg by mouth 2 (two) times daily.     cholecalciferol (VITAMIN D3) 25 MCG (1000 UNIT) tablet Take 1,000 Units by mouth daily.     cholestyramine (QUESTRAN) 4 g packet Take 1 packet by mouth 3 (three) times daily.     doxycycline (VIBRA-TABS) 100 MG tablet Take 100 mg by mouth daily.     furosemide (LASIX) 40 MG tablet Take 0.5 tablets (20 mg total) by mouth daily. 30 tablet 0   ketoconazole (NIZORAL) 2 % shampoo Apply topically 3 (three) times a week. (Patient not taking: Reported on 10/05/2022)     levothyroxine (SYNTHROID) 75 MCG tablet Take 75 mcg by mouth daily before breakfast.      metFORMIN (GLUCOPHAGE-XR) 500 MG 24 hr tablet Take 500 mg by mouth 2 (two) times daily.     midodrine (PROAMATINE) 5 MG tablet TAKE 1 TABLET (5 MG TOTAL) BY MOUTH 3 (THREE) TIMES DAILY WITH MEALS. 270 tablet 1   mupirocin ointment (BACTROBAN) 2 % Apply topically daily.     ofloxacin (OCUFLOX) 0.3 % ophthalmic solution 1 drop 4 (four) times daily.     Omega-3 Fatty Acids (FISH OIL) 1000 MG CAPS Take 1,000 mg by mouth daily.     ondansetron (ZOFRAN) 4 MG tablet Take 1 tablet (4 mg total) by mouth every 6 (six) hours as needed for nausea. 20 tablet 0   pantoprazole (PROTONIX) 40 MG tablet TAKE 1 TABLET BY MOUTH EVERY DAY (Patient not taking: Reported on 10/05/2022) 90 tablet 1   polyethylene glycol (  MIRALAX / GLYCOLAX) 17 g packet Take 17 g by mouth daily as needed for mild constipation. 14 each 0   rOPINIRole (REQUIP) 0.5 MG tablet Take 0.5-1 mg by mouth See admin instructions. Taking one tablet at bedtime, if no relief take an additional tablet     rosuvastatin (CRESTOR) 20 MG tablet Take 10 mg by mouth daily.     vitamin B-12 (CYANOCOBALAMIN) 500 MCG tablet Take 500 mcg by mouth daily.     Current Facility-Administered Medications  Medication Dose Route Frequency Provider Last Rate Last Admin   sodium chloride flush (NS) 0.9 % injection 10 mL  10 mL Intracatheter PRN Mosher, Harvin Hazel A, PA-C   10 mL at 12/07/22 1525      I,Oluwatobi Asade,acting as a scribe for Dellia Beckwith, MD.,have documented all relevant documentation on the behalf of Dellia Beckwith, MD,as directed by  Dellia Beckwith, MD while in the presence of Dellia Beckwith, MD.

## 2022-12-07 ENCOUNTER — Encounter: Payer: Self-pay | Admitting: Oncology

## 2022-12-07 ENCOUNTER — Other Ambulatory Visit: Payer: Self-pay | Admitting: Oncology

## 2022-12-07 ENCOUNTER — Inpatient Hospital Stay (INDEPENDENT_AMBULATORY_CARE_PROVIDER_SITE_OTHER): Payer: Medicare Other | Admitting: Oncology

## 2022-12-07 ENCOUNTER — Inpatient Hospital Stay: Payer: Medicare Other | Attending: Oncology

## 2022-12-07 VITALS — BP 86/54 | HR 62 | Temp 98.4°F | Resp 18 | Ht 70.0 in | Wt 151.1 lb

## 2022-12-07 DIAGNOSIS — C44310 Basal cell carcinoma of skin of unspecified parts of face: Secondary | ICD-10-CM

## 2022-12-07 DIAGNOSIS — Z452 Encounter for adjustment and management of vascular access device: Secondary | ICD-10-CM | POA: Diagnosis not present

## 2022-12-07 DIAGNOSIS — K922 Gastrointestinal hemorrhage, unspecified: Secondary | ICD-10-CM | POA: Insufficient documentation

## 2022-12-07 DIAGNOSIS — C4440 Unspecified malignant neoplasm of skin of scalp and neck: Secondary | ICD-10-CM

## 2022-12-07 DIAGNOSIS — C4491 Basal cell carcinoma of skin, unspecified: Secondary | ICD-10-CM | POA: Insufficient documentation

## 2022-12-07 DIAGNOSIS — Z5112 Encounter for antineoplastic immunotherapy: Secondary | ICD-10-CM | POA: Diagnosis not present

## 2022-12-07 DIAGNOSIS — C9110 Chronic lymphocytic leukemia of B-cell type not having achieved remission: Secondary | ICD-10-CM

## 2022-12-07 DIAGNOSIS — Z79620 Long term (current) use of immunosuppressive biologic: Secondary | ICD-10-CM | POA: Insufficient documentation

## 2022-12-07 DIAGNOSIS — D509 Iron deficiency anemia, unspecified: Secondary | ICD-10-CM

## 2022-12-07 DIAGNOSIS — D500 Iron deficiency anemia secondary to blood loss (chronic): Secondary | ICD-10-CM | POA: Diagnosis not present

## 2022-12-07 DIAGNOSIS — K5900 Constipation, unspecified: Secondary | ICD-10-CM | POA: Insufficient documentation

## 2022-12-07 LAB — CBC AND DIFFERENTIAL
HCT: 33 — AB (ref 41–53)
Hemoglobin: 10.3 — AB (ref 13.5–17.5)
Neutrophils Absolute: 4.68
Platelets: 191 10*3/uL (ref 150–400)
WBC: 78

## 2022-12-07 LAB — BASIC METABOLIC PANEL
BUN: 22 — AB (ref 4–21)
CO2: 21 (ref 13–22)
Chloride: 104 (ref 99–108)
Creatinine: 0.9 (ref 0.6–1.3)
Glucose: 140
Potassium: 4.2 mEq/L (ref 3.5–5.1)
Sodium: 138 (ref 137–147)

## 2022-12-07 LAB — HEPATIC FUNCTION PANEL
ALT: 16 U/L (ref 10–40)
AST: 23 (ref 14–40)
Alkaline Phosphatase: 57 (ref 25–125)
Bilirubin, Total: 0.6

## 2022-12-07 LAB — CBC: RBC: 3.54 — AB (ref 3.87–5.11)

## 2022-12-07 LAB — TSH: TSH: 1.957 u[IU]/mL (ref 0.350–4.500)

## 2022-12-07 LAB — COMPREHENSIVE METABOLIC PANEL
Albumin: 3.3 — AB (ref 3.5–5.0)
Calcium: 8.5 — AB (ref 8.7–10.7)

## 2022-12-07 LAB — CBC W DIFFERENTIAL (~~LOC~~ CC SCANNED REPORT)

## 2022-12-07 MED ORDER — SODIUM CHLORIDE 0.9% FLUSH
10.0000 mL | INTRAVENOUS | Status: DC | PRN
  Administered 2022-12-07: 10 mL

## 2022-12-07 MED ORDER — HEPARIN SOD (PORK) LOCK FLUSH 100 UNIT/ML IV SOLN
500.0000 [IU] | Freq: Once | INTRAVENOUS | Status: AC | PRN
  Administered 2022-12-07: 500 [IU]

## 2022-12-07 NOTE — Progress Notes (Signed)
CRITICAL VALUE STICKER  CRITICAL VALUE:  WBC 78  RECEIVER (on-site recipient of call):  Dyane Dustman RN  DATE & TIME NOTIFIED:   12/07/2022 @ 1559  MESSENGER (representative from lab):  Angelique Blonder [facility] lab  MD NOTIFIED:   Dr. Gilman Buttner  TIME OF NOTIFICATION:  1607  RESPONSE:   Scheduled to see patient.

## 2022-12-08 ENCOUNTER — Other Ambulatory Visit: Payer: Self-pay | Admitting: Oncology

## 2022-12-08 ENCOUNTER — Encounter: Payer: Self-pay | Admitting: Hematology and Oncology

## 2022-12-08 ENCOUNTER — Encounter: Payer: Self-pay | Admitting: Oncology

## 2022-12-08 DIAGNOSIS — C44310 Basal cell carcinoma of skin of unspecified parts of face: Secondary | ICD-10-CM

## 2022-12-08 DIAGNOSIS — C4491 Basal cell carcinoma of skin, unspecified: Secondary | ICD-10-CM

## 2022-12-09 ENCOUNTER — Inpatient Hospital Stay: Payer: Medicare Other

## 2022-12-09 ENCOUNTER — Other Ambulatory Visit: Payer: Self-pay

## 2022-12-09 VITALS — BP 134/66 | HR 59 | Temp 98.1°F | Resp 18 | Ht 70.0 in | Wt 151.0 lb

## 2022-12-09 DIAGNOSIS — C4491 Basal cell carcinoma of skin, unspecified: Secondary | ICD-10-CM

## 2022-12-09 DIAGNOSIS — Z5112 Encounter for antineoplastic immunotherapy: Secondary | ICD-10-CM | POA: Diagnosis not present

## 2022-12-09 DIAGNOSIS — C44310 Basal cell carcinoma of skin of unspecified parts of face: Secondary | ICD-10-CM

## 2022-12-09 MED ORDER — HEPARIN SOD (PORK) LOCK FLUSH 100 UNIT/ML IV SOLN
500.0000 [IU] | Freq: Once | INTRAVENOUS | Status: AC | PRN
  Administered 2022-12-09: 500 [IU]

## 2022-12-09 MED ORDER — SODIUM CHLORIDE 0.9 % IV SOLN
350.0000 mg | Freq: Once | INTRAVENOUS | Status: AC
  Administered 2022-12-09: 350 mg via INTRAVENOUS
  Filled 2022-12-09: qty 7

## 2022-12-09 MED ORDER — SODIUM CHLORIDE 0.9% FLUSH
10.0000 mL | INTRAVENOUS | Status: DC | PRN
  Administered 2022-12-09: 10 mL

## 2022-12-09 MED ORDER — SODIUM CHLORIDE 0.9 % IV SOLN: Freq: Once | INTRAVENOUS | Status: AC

## 2022-12-09 NOTE — Patient Instructions (Signed)
Cemiplimab Injection What is this medication? CEMIPLIMAB (se MIP li mab) treats skin cancer and lung cancer. It works by helping your immune system slow or stop the spread of cancer cells. It is a monoclonal antibody. This medicine may be used for other purposes; ask your health care provider or pharmacist if you have questions. COMMON BRAND NAME(S): LIBTAYO What should I tell my care team before I take this medication? They need to know if you have any of these conditions: Allogeneic stem cell transplant (uses someone else's stem cells) Autoimmune diseases, such as Crohn disease, ulcerative colitis, lupus History of chest radiation Nervous system problems, such as Guillain-Barre syndrome or myasthenia gravis Organ transplant An unusual or allergic reaction to cemiplimab, other medications, foods, dyes, or preservatives Pregnant or trying to get pregnant Breastfeeding How should I use this medication? This medication is infused into a vein. It is given by your care team in a hospital or clinic setting. A special MedGuide will be given to you before each treatment. Be sure to read this information carefully each time. Talk to your care team about the use of this medication in children. Special care may be needed. Overdosage: If you think you have taken too much of this medicine contact a poison control center or emergency room at once. NOTE: This medicine is only for you. Do not share this medicine with others. What if I miss a dose? Keep appointments for follow-up doses. It is important not to miss your dose. Call your care team if you are unable to keep an appointment. What may interact with this medication? Interactions have not been studied. This list may not describe all possible interactions. Give your health care provider a list of all the medicines, herbs, non-prescription drugs, or dietary supplements you use. Also tell them if you smoke, drink alcohol, or use illegal drugs. Some  items may interact with your medicine. What should I watch for while using this medication? Visit your care team for regular checks on your progress. It may be some time before you see the benefit from this medication. You may need blood work done while taking this medication. This medication may cause serious skin reactions. They can happen weeks to months after starting the medication. Contact your care team right away if you notice fevers or flu-like symptoms with a rash. The rash may be red or purple and then turn into blisters or peeling of the skin. You may also notice a red rash with swelling of the face, lips, or lymph nodes in your neck or under your arms. Tell your care team right away if you have any change in your eyesight. Talk to your care team if you may be pregnant. You will need a negative pregnancy test before starting this medication. Contraception is recommended while taking this medication and for 4 months after the last dose. Your care team can help you find the option that works for you. Do not breastfeed while taking this medication and for at least 4 months after the last dose. What side effects may I notice from receiving this medication? Side effects that you should report to your care team as soon as possible: Allergic reactions--skin rash, itching, hives, swelling of the face, lips, tongue, or throat Dry cough, shortness of breath or trouble breathing Eye pain, redness, irritation, or discharge with blurry or decreased vision Heart muscle inflammation--unusual weakness or fatigue, shortness of breath, chest pain, fast or irregular heartbeat, dizziness, swelling of the ankles, feet, or hands  Hormone gland problems--headache, sensitivity to light, unusual weakness or fatigue, dizziness, fast or irregular heartbeat, increased sensitivity to cold or heat, excessive sweating, constipation, hair loss, increased thirst or amount of urine, tremors or shaking, irritability Infusion  reactions--chest pain, shortness of breath or trouble breathing, feeling faint or lightheaded Kidney injury (glomerulonephritis)--decrease in the amount of urine, red or dark brown urine, foamy or bubbly urine, swelling of the ankles, hands, or feet Liver injury--right upper belly pain, loss of appetite, nausea, light-colored stool, dark yellow or brown urine, yellowing skin or eyes, unusual weakness or fatigue Pain, tingling, or numbness in the hands or feet, muscle weakness, change in vision, confusion or trouble speaking, loss of balance or coordination, trouble walking, seizures Rash, fever, and swollen lymph nodes Redness, blistering, peeling, or loosening of the skin, including inside the mouth Sudden or severe stomach pain, bloody diarrhea, fever, nausea, vomiting Side effects that usually do not require medical attention (report these to your care team if they continue or are bothersome): Bone, joint, or muscle pain Diarrhea Fatigue Loss of appetite Nausea Skin rash This list may not describe all possible side effects. Call your doctor for medical advice about side effects. You may report side effects to FDA at 1-800-FDA-1088. Where should I keep my medication? This medication is given in a hospital or clinic and will not be stored at home. NOTE: This sheet is a summary. It may not cover all possible information. If you have questions about this medicine, talk to your doctor, pharmacist, or health care provider.  2024 [employer] (2022-06-06 00:00:00)

## 2022-12-28 ENCOUNTER — Encounter: Payer: Self-pay | Admitting: Oncology

## 2022-12-28 ENCOUNTER — Inpatient Hospital Stay: Payer: Medicare Other

## 2022-12-28 ENCOUNTER — Other Ambulatory Visit: Payer: Self-pay | Admitting: Oncology

## 2022-12-28 ENCOUNTER — Inpatient Hospital Stay (INDEPENDENT_AMBULATORY_CARE_PROVIDER_SITE_OTHER): Payer: Medicare Other | Admitting: Oncology

## 2022-12-28 VITALS — BP 106/59 | HR 61 | Temp 97.9°F | Resp 18 | Ht 70.0 in | Wt 150.2 lb

## 2022-12-28 DIAGNOSIS — D509 Iron deficiency anemia, unspecified: Secondary | ICD-10-CM | POA: Diagnosis not present

## 2022-12-28 DIAGNOSIS — C4491 Basal cell carcinoma of skin, unspecified: Secondary | ICD-10-CM

## 2022-12-28 DIAGNOSIS — Z5112 Encounter for antineoplastic immunotherapy: Secondary | ICD-10-CM | POA: Diagnosis not present

## 2022-12-28 DIAGNOSIS — C44310 Basal cell carcinoma of skin of unspecified parts of face: Secondary | ICD-10-CM | POA: Diagnosis not present

## 2022-12-28 DIAGNOSIS — L03211 Cellulitis of face: Secondary | ICD-10-CM

## 2022-12-28 LAB — HEPATIC FUNCTION PANEL
ALT: 15 U/L (ref 10–40)
AST: 24 (ref 14–40)
Alkaline Phosphatase: 59 (ref 25–125)
Bilirubin, Total: 0.5

## 2022-12-28 LAB — BASIC METABOLIC PANEL
BUN: 23 — AB (ref 4–21)
CO2: 25 — AB (ref 13–22)
Chloride: 107 (ref 99–108)
Creatinine: 0.8 (ref 0.6–1.3)
Glucose: 87
Potassium: 4.3 meq/L (ref 3.5–5.1)
Sodium: 138 (ref 137–147)

## 2022-12-28 LAB — TSH: TSH: 3.051 u[IU]/mL (ref 0.350–4.500)

## 2022-12-28 LAB — CBC AND DIFFERENTIAL
HCT: 32 — AB (ref 41–53)
Hemoglobin: 9.7 — AB (ref 13.5–17.5)
Neutrophils Absolute: 16.42
Platelets: 228 10*3/uL (ref 150–400)
WBC: 96.6

## 2022-12-28 LAB — COMPREHENSIVE METABOLIC PANEL
Albumin: 3.2 — AB (ref 3.5–5.0)
Calcium: 8.3 — AB (ref 8.7–10.7)

## 2022-12-28 LAB — CBC: RBC: 3.4 — AB (ref 3.87–5.11)

## 2022-12-28 LAB — CBC W DIFFERENTIAL (~~LOC~~ CC SCANNED REPORT)

## 2022-12-28 LAB — COMPREHENSIVE METABOLIC PANEL (ASHBORO CC SCANNED REPORT)

## 2022-12-28 MED ORDER — CEPHALEXIN 500 MG PO CAPS: 500.0000 mg | ORAL_CAPSULE | Freq: Three times a day (TID) | ORAL | 0 refills | Status: AC

## 2022-12-28 MED ORDER — HEPARIN SOD (PORK) LOCK FLUSH 100 UNIT/ML IV SOLN
500.0000 [IU] | Freq: Once | INTRAVENOUS | Status: AC | PRN
  Administered 2022-12-28: 500 [IU]

## 2022-12-28 MED ORDER — SODIUM CHLORIDE 0.9% FLUSH
10.0000 mL | INTRAVENOUS | Status: DC | PRN
  Administered 2022-12-28: 10 mL

## 2022-12-28 NOTE — Progress Notes (Signed)
CRITICAL VALUE STICKER  CRITICAL VALUE:  WBC 96.6  RECEIVER (on-site recipient of call):  Dyane Dustman RN  DATE & TIME NOTIFIED:   12/28/2022 @ 1610  MESSENGER (representative from lab):  Cindy @ [facility]  MD NOTIFIED:   Dr. Gilman Buttner  TIME OF NOTIFICATION:  1617  RESPONSE:  schedule to see patient.

## 2022-12-29 LAB — T4: T4, Total: 8.1 ug/dL (ref 4.5–12.0)

## 2022-12-30 ENCOUNTER — Encounter: Payer: Self-pay | Admitting: Oncology

## 2022-12-30 ENCOUNTER — Inpatient Hospital Stay: Payer: Medicare Other

## 2022-12-30 VITALS — BP 106/63 | HR 64 | Temp 98.1°F | Resp 18 | Ht 70.0 in | Wt 150.0 lb

## 2022-12-30 DIAGNOSIS — Z5112 Encounter for antineoplastic immunotherapy: Secondary | ICD-10-CM | POA: Diagnosis not present

## 2022-12-30 DIAGNOSIS — C4491 Basal cell carcinoma of skin, unspecified: Secondary | ICD-10-CM

## 2022-12-30 DIAGNOSIS — C44310 Basal cell carcinoma of skin of unspecified parts of face: Secondary | ICD-10-CM

## 2022-12-30 MED ORDER — SODIUM CHLORIDE 0.9% FLUSH
10.0000 mL | INTRAVENOUS | Status: DC | PRN
  Administered 2022-12-30: 10 mL

## 2022-12-30 MED ORDER — SODIUM CHLORIDE 0.9 % IV SOLN: Freq: Once | INTRAVENOUS | Status: AC

## 2022-12-30 MED ORDER — SODIUM CHLORIDE 0.9 % IV SOLN
350.0000 mg | Freq: Once | INTRAVENOUS | Status: AC
  Administered 2022-12-30: 350 mg via INTRAVENOUS
  Filled 2022-12-30: qty 7

## 2022-12-30 MED ORDER — HEPARIN SOD (PORK) LOCK FLUSH 100 UNIT/ML IV SOLN
500.0000 [IU] | Freq: Once | INTRAVENOUS | Status: AC | PRN
  Administered 2022-12-30: 500 [IU]

## 2022-12-30 NOTE — Patient Instructions (Signed)
Dehydration, Adult Dehydration is a condition in which there is not enough water or other fluids in the body. This happens when a person loses more fluids than they take in. Important organs cannot work right without the right amount of fluids. Any loss of fluids from the body can cause dehydration. Dehydration can be mild, worse, or very bad. It should be treated right away to keep it from getting very bad. What are the causes? Conditions that cause loss of water in the body. They include: Watery poop (diarrhea). Vomiting. Sweating a lot. Fever. Infection. Peeing (urinating) a lot. Not drinking enough fluids. Certain medicines, such as medicines that take extra fluid out of the body (diuretics). Lack of safe drinking water. Not being able to get enough water and food. What increases the risk? Having a long-term (chronic) illness that has not been treated the right way, such as: Diabetes. Heart disease. Kidney disease. Being 32 years of age or older. Having a disability. Living in a place that is high above the ground or sea (high in altitude). The thinner, drier air causes more fluid loss. Doing exercises that put stress on your body for a long time. Being active when in hot places. What are the signs or symptoms? Symptoms of dehydration depend on how bad it is. Mild or worse dehydration Thirst. Dry lips or dry mouth. Feeling dizzy or light-headed. Muscle cramps. Passing little pee or dark pee. Pee may be the color of tea. Headache. Very bad dehydration Changes in skin. Skin may: Be cold to the touch (clammy). Be blotchy or pale. Not go back to normal right after you pinch it and let it go. Little or no tears, pee, or sweat. Fast breathing. Low blood pressure. Weak pulse. Pulse that is more than 100 beats a minute when you are sitting still. Other changes, such as: Feeling very thirsty. Eyes that look hollow (sunken). Cold hands and feet. Being confused. Being very  tired (lethargic) or having trouble waking from sleep. Losing weight. Loss of consciousness. How is this treated? Treatment for this condition depends on how bad your dehydration is. Treatment should start right away. Do not wait until your condition gets very bad. Very bad dehydration is an emergency. You will need to go to a hospital. Mild or worse dehydration can be treated at home. You may be asked to: Drink more fluids. Drink an oral rehydration solution (ORS). This drink gives you the right amount of fluids, salts, and minerals (electrolytes). Very bad dehydration can be treated: With fluids through an IV tube. By correcting low levels of electrolytes in the body. By treating the problem that caused your dehydration. Follow these instructions at home: Oral rehydration solution If told by your doctor, drink an ORS: Make an ORS. Use instructions on the package. Start by drinking small amounts, about  cup (120 mL) every 5-10 minutes. Slowly drink more until you have had the amount that your doctor said to have.  Eating and drinking  Drink enough clear fluid to keep your pee pale yellow. If you were told to drink an ORS, finish the ORS first. Then, start slowly drinking other clear fluids. Drink fluids such as: Water. Do not drink only water. Doing that can make the salt (sodium) level in your body get too low. Water from ice chips you suck on. Fruit juice that you have added water to (diluted). Low-calorie sports drinks. Eat foods that have the right amounts of salts and minerals, such as bananas, oranges, potatoes,  tomatoes, or spinach. Do not drink alcohol. Avoid drinks that have caffeine or sugar. These include:: High-calorie sports drinks. Fruit juice that you did not add water to. Soda. Coffee or energy drinks. Avoid foods that are greasy or have a lot of fat or sugar. General instructions Take over-the-counter and prescription medicines only as told by your doctor. Do  not take sodium tablets. Doing that can make the salt level in your body get too high. Return to your normal activities as told by your doctor. Ask your doctor what activities are safe for you. Keep all follow-up visits. Your doctor may check and change your treatment. Contact a doctor if: You have pain in your belly (abdomen) and the pain: Gets worse. Stays in one place. You have a rash. You have a stiff neck. You get angry or annoyed more easily than normal. You are more tired or have a harder time waking than normal. You feel weak or dizzy. You feel very thirsty. Get help right away if: You have any symptoms of very bad dehydration. You vomit every time you eat or drink. Your vomiting gets worse, does not go away, or you vomit blood or green stuff. You are getting treatment, but symptoms are getting worse. You have a fever. You have a very bad headache. You have: Diarrhea that gets worse or does not go away. Blood in your poop (stool). This may cause poop to look black and tarry. No pee in 6-8 hours. Only a small amount of pee in 6-8 hours, and the pee is very dark. You have trouble breathing. These symptoms may be an emergency. Get help right away. Call 911. Do not wait to see if the symptoms will go away. Do not drive yourself to the hospital. This information is not intended to replace advice given to you by your health care provider. Make sure you discuss any questions you have with your health care provider. Document Revised: 11/15/2021 Document Reviewed: 11/15/2021 Else[employer] Patient Education  2024 [employer] Cemiplimab Injection What is this medication? CEMIPLIMAB (se MIP li mab) treats skin cancer and lung cancer. It works by helping your immune system slow or stop the spread of cancer cells. It is a monoclonal antibody. This medicine may be used for other purposes; ask your health care provider or pharmacist if you have questions. COMMON BRAND NAME(S): LIBTAYO What  should I tell my care team before I take this medication? They need to know if you have any of these conditions: Allogeneic stem cell transplant (uses someone else's stem cells) Autoimmune diseases, such as Crohn disease, ulcerative colitis, lupus History of chest radiation Nervous system problems, such as Guillain-Barre syndrome or myasthenia gravis Organ transplant An unusual or allergic reaction to cemiplimab, other medications, foods, dyes, or preservatives Pregnant or trying to get pregnant Breastfeeding How should I use this medication? This medication is infused into a vein. It is given by your care team in a hospital or clinic setting. A special MedGuide will be given to you before each treatment. Be sure to read this information carefully each time. Talk to your care team about the use of this medication in children. Special care may be needed. Overdosage: If you think you have taken too much of this medicine contact a poison control center or emergency room at once. NOTE: This medicine is only for you. Do not share this medicine with others. What if I miss a dose? Keep appointments for follow-up doses. It is important not to miss your  dose. Call your care team if you are unable to keep an appointment. What may interact with this medication? Interactions have not been studied. This list may not describe all possible interactions. Give your health care provider a list of all the medicines, herbs, non-prescription drugs, or dietary supplements you use. Also tell them if you smoke, drink alcohol, or use illegal drugs. Some items may interact with your medicine. What should I watch for while using this medication? Visit your care team for regular checks on your progress. It may be some time before you see the benefit from this medication. You may need blood work done while taking this medication. This medication may cause serious skin reactions. They can happen weeks to months after  starting the medication. Contact your care team right away if you notice fevers or flu-like symptoms with a rash. The rash may be red or purple and then turn into blisters or peeling of the skin. You may also notice a red rash with swelling of the face, lips, or lymph nodes in your neck or under your arms. Tell your care team right away if you have any change in your eyesight. Talk to your care team if you may be pregnant. You will need a negative pregnancy test before starting this medication. Contraception is recommended while taking this medication and for 4 months after the last dose. Your care team can help you find the option that works for you. Do not breastfeed while taking this medication and for at least 4 months after the last dose. What side effects may I notice from receiving this medication? Side effects that you should report to your care team as soon as possible: Allergic reactions--skin rash, itching, hives, swelling of the face, lips, tongue, or throat Dry cough, shortness of breath or trouble breathing Eye pain, redness, irritation, or discharge with blurry or decreased vision Heart muscle inflammation--unusual weakness or fatigue, shortness of breath, chest pain, fast or irregular heartbeat, dizziness, swelling of the ankles, feet, or hands Hormone gland problems--headache, sensitivity to light, unusual weakness or fatigue, dizziness, fast or irregular heartbeat, increased sensitivity to cold or heat, excessive sweating, constipation, hair loss, increased thirst or amount of urine, tremors or shaking, irritability Infusion reactions--chest pain, shortness of breath or trouble breathing, feeling faint or lightheaded Kidney injury (glomerulonephritis)--decrease in the amount of urine, red or dark brown urine, foamy or bubbly urine, swelling of the ankles, hands, or feet Liver injury--right upper belly pain, loss of appetite, nausea, light-colored stool, dark yellow or brown urine,  yellowing skin or eyes, unusual weakness or fatigue Pain, tingling, or numbness in the hands or feet, muscle weakness, change in vision, confusion or trouble speaking, loss of balance or coordination, trouble walking, seizures Rash, fever, and swollen lymph nodes Redness, blistering, peeling, or loosening of the skin, including inside the mouth Sudden or severe stomach pain, bloody diarrhea, fever, nausea, vomiting Side effects that usually do not require medical attention (report these to your care team if they continue or are bothersome): Bone, joint, or muscle pain Diarrhea Fatigue Loss of appetite Nausea Skin rash This list may not describe all possible side effects. Call your doctor for medical advice about side effects. You may report side effects to FDA at 1-800-FDA-1088. Where should I keep my medication? This medication is given in a hospital or clinic and will not be stored at home. NOTE: This sheet is a summary. It may not cover all possible information. If you have questions about this medicine,  talk to your doctor, pharmacist, or health care provider.  2024 [employer]vier/Gold Standard (2022-06-06 00:00:00)

## 2023-01-18 ENCOUNTER — Encounter: Payer: Self-pay | Admitting: Hematology and Oncology

## 2023-01-18 ENCOUNTER — Encounter: Payer: Self-pay | Admitting: Oncology

## 2023-01-18 ENCOUNTER — Inpatient Hospital Stay: Payer: Medicare Other | Attending: Oncology | Admitting: Hematology and Oncology

## 2023-01-18 ENCOUNTER — Inpatient Hospital Stay: Payer: Medicare Other

## 2023-01-18 VITALS — BP 98/61 | HR 65 | Temp 97.5°F | Resp 20 | Ht 70.0 in | Wt 149.0 lb

## 2023-01-18 DIAGNOSIS — Z7984 Long term (current) use of oral hypoglycemic drugs: Secondary | ICD-10-CM | POA: Diagnosis not present

## 2023-01-18 DIAGNOSIS — D509 Iron deficiency anemia, unspecified: Secondary | ICD-10-CM | POA: Diagnosis not present

## 2023-01-18 DIAGNOSIS — Z5112 Encounter for antineoplastic immunotherapy: Secondary | ICD-10-CM | POA: Diagnosis present

## 2023-01-18 DIAGNOSIS — C4491 Basal cell carcinoma of skin, unspecified: Secondary | ICD-10-CM

## 2023-01-18 DIAGNOSIS — C44310 Basal cell carcinoma of skin of unspecified parts of face: Secondary | ICD-10-CM | POA: Diagnosis not present

## 2023-01-18 DIAGNOSIS — Z79899 Other long term (current) drug therapy: Secondary | ICD-10-CM | POA: Diagnosis not present

## 2023-01-18 DIAGNOSIS — R197 Diarrhea, unspecified: Secondary | ICD-10-CM | POA: Insufficient documentation

## 2023-01-18 DIAGNOSIS — C4440 Unspecified malignant neoplasm of skin of scalp and neck: Secondary | ICD-10-CM | POA: Diagnosis not present

## 2023-01-18 DIAGNOSIS — C9110 Chronic lymphocytic leukemia of B-cell type not having achieved remission: Secondary | ICD-10-CM | POA: Diagnosis present

## 2023-01-18 DIAGNOSIS — Z87891 Personal history of nicotine dependence: Secondary | ICD-10-CM | POA: Diagnosis not present

## 2023-01-18 DIAGNOSIS — Z8546 Personal history of malignant neoplasm of prostate: Secondary | ICD-10-CM | POA: Insufficient documentation

## 2023-01-18 LAB — COMPREHENSIVE METABOLIC PANEL
Albumin: 2.8 — AB (ref 3.5–5.0)
Calcium: 8.2 — AB (ref 8.7–10.7)

## 2023-01-18 LAB — HEPATIC FUNCTION PANEL
ALT: 15 U/L (ref 10–40)
AST: 22 (ref 14–40)
Alkaline Phosphatase: 59 (ref 25–125)
Bilirubin, Total: 0.3

## 2023-01-18 LAB — BASIC METABOLIC PANEL
BUN: 23 — AB (ref 4–21)
CO2: 24 — AB (ref 13–22)
Chloride: 110 — AB (ref 99–108)
Creatinine: 0.9 (ref 0.6–1.3)
EGFR: 60
Glucose: 140
Potassium: 3.7 meq/L (ref 3.5–5.1)
Sodium: 138 (ref 137–147)

## 2023-01-18 LAB — CBC AND DIFFERENTIAL
HCT: 31 — AB (ref 41–53)
Hemoglobin: 9.4 — AB (ref 13.5–17.5)
Neutrophils Absolute: 5.59
Platelets: 219 10*3/uL (ref 150–400)
WBC: 111.7

## 2023-01-18 LAB — CBC: RBC: 3.29 — AB (ref 3.87–5.11)

## 2023-01-18 LAB — COMPREHENSIVE METABOLIC PANEL (ASHBORO CC SCANNED REPORT)

## 2023-01-18 LAB — CBC W DIFFERENTIAL (~~LOC~~ CC SCANNED REPORT)

## 2023-01-18 LAB — TSH: TSH: 3.505 u[IU]/mL (ref 0.350–4.500)

## 2023-01-18 MED ORDER — SODIUM CHLORIDE 0.9% FLUSH
10.0000 mL | INTRAVENOUS | Status: DC | PRN
  Administered 2023-01-18: 10 mL

## 2023-01-18 MED ORDER — HEPARIN SOD (PORK) LOCK FLUSH 100 UNIT/ML IV SOLN
500.0000 [IU] | Freq: Once | INTRAVENOUS | Status: AC | PRN
  Administered 2023-01-18: 500 [IU]

## 2023-01-18 NOTE — Progress Notes (Signed)
[facility]  [address] 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  He received 8 cycles with improvement in his skin lesions.  Unfortunately, he has had progressive worsening fatigue and debility and so we stopped his infusions in September but have now resumed them as of February, 2024.  The lesions of his skin fluctuate but seem worse today.  Facial cellulitis This involves his nose and left face with diffuse erythema after having extensive resection of the skin cancer of his left nose.  I will place him on Keflex 500 mg tid for 10 days.   CLL (chronic lymphocytic leukemia) (HCC) We held off on treatment of his CLL due to multiple medical problems.  He had worsening anemia, requiring occasional transfusion.  This is likely due to his CLL, as he received IV iron his iron deficiency. He started acalabrutinib 100 mg twice daily for his CLL on July 21st, 2023. He has had an increase in his white blood cell count since starting acalabrutinib.  An intial increase in the lymphocytosis is common with acalabrutinib.  His white blood cell count has decreased somewhat from 222,000 to 212,000.  He has persistent anemia without definite nutritional deficiency, so this was felt to be due to his CLL.  He then reported black stool.  He was started on pantoprazole 40 mg daily.  He had worsening diarrhea and stool studies revealed enteropathogenic E. Coli, which was treated with ciprofloxacin.   He had persistent melena and stool Hemoccults x3  were positive, so alcalabrutinib was discontinued on September 15th, 2023.  His white blood cell count decreased to 109,900 and his hemoglobin improved to 9.2 on September 21. His WBC came down to 15,000 by November, 2023. Since stopping treatment, his white count is rising again to 49,000, then 57,000 in January, 2024, 58,000 in February, and 68,000 in March. His WBC then leveled off for the last two checkups but is increased to 89,000 in June, 2024 but down to 77,700 in July, 2024 and stable at 78,000 in August.  I think the current increase to 96,000 is related to his facial cellulitis and we will see if it responds to antibiotics.   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 He was transfused in August of 2023, when his hemoglobin dropped to 8 and he was very symptomatic. He last received IV iron replacement in June.  He required transfusion in August when his hemoglobin dropped to 8 and he was symptomatic. His hemoglobin has been better since December. It was up to 10.4 in February and went down to 9.4 it has now been 9.6 and he has slowly improved by a small amount with every visit. It has decreased slightly this time.   Plan:  The lesions of his left nose have gotten worse, so the dermatologist did some aggressive resection last week. He now has some facial cellulitis of the nose and left face so I will treat him with Keflex 500 mg tid for 10 days. 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  I do not think the infusions are causing any harm to him and in June he had definite improvement.  I recommend that he continue with his infusions for now, and he agreed to this, but is discouraged by his many disabilities. His day 1 cycle 9 of Libtayo is scheduled for 12/30/2022. He is worried about his bills as the payment from the grant has not been made, and the staff is working on this.  I will see him  back in 3 weeks with CBC and CMP.  The patient  and his daughter understand the plans discussed today and are in agreement with them.  They know to contact our office if he develops concerns prior to his next appointment.   I provided 20 minutes of face-to-face time during this this encounter and > 50% was spent counseling as documented under my assessment and plan.   Dellia Beckwith, MD  [facility] [address] [place] [address] Dept: [phone] Dept Fax: [phone]   No orders of the defined types were placed in this encounter.    CHIEF COMPLAINT:  CC:  Squamous cell skin cancers, CLL  Current Treatment: Libtayo  HISTORY OF PRESENT ILLNESS:   Oncology History  CLL (chronic lymphocytic leukemia) (HCC)  05/25/2015 Initial Diagnosis   CLL (chronic lymphocytic leukemia) (HCC)   02/04/2021 Cancer Staging   Staging form: Chronic Lymphocytic Leukemia / Small Lymphocytic Lymphoma, AJCC 8th Edition - Clinical stage from 02/04/2021: Modified Rai Stage III (Modified Rai risk: High, Binet: Stage A, Lymphocytosis: Present, Adenopathy: Absent, Organomegaly: Absent, Anemia: Present, Thrombocytopenia: Absent) - Signed by Dellia Beckwith, MD on 02/08/2021 Stage prefix: Recurrence Absolute lymphocyte count (ALC) (cells/uL): 100 Hemoglobin (Hgb) (g/dL): 51.8 Platelet count (x10E9/L of blood): 184   Cancer of the skin, basal cell  05/06/2021 Initial Diagnosis   Cancer of the skin, basal cell   08/30/2021 - 12/31/2021 Chemotherapy   Patient is on Treatment Plan : SQUAMOUS SKIN Cemiplimab q21d     08/30/2021 - 01/21/2022 Chemotherapy   Patient is on Treatment Plan : ADVANCED CUTANEOUS SQUAMOUS CELL CARCINOMA Cemiplimab q21d     07/15/2022 -  Chemotherapy   Patient is on Treatment Plan : ADVANCED CUTANEOUS SQUAMOUS CELL CARCINOMA Cemiplimab q21d     BCC (basal cell carcinoma), face  12/28/2016 Initial Diagnosis   BCC (basal cell carcinoma), face   07/15/2022 -   Chemotherapy   Patient is on Treatment Plan : ADVANCED CUTANEOUS SQUAMOUS CELL CARCINOMA Cemiplimab q21d        INTERVAL HISTORY:  Fernando Young is here today for repeat clinical assessment after stopping acalabrutinib for his CLL in September, 2023. His CLL has just been very slowly progressive. He also has Squamous cell skin cancers. Patient states that he feels tired most of the time. The lesions on his face have gotten worse. He had an appointment with the dermatologist last week and he did some resection of the extensive cancer of his left nose. He now has increased erythema of the left face and nose and I feel this is consistent with facial cellulitis.  His day 1 cycle 9 of Libtayo is scheduled for 12/30/2022. His hemoglobin is a little lower at 9.7, WBC is much higher at 96,600, but some of this increase is neutrophils, and platelet count is 228,000 today. His CMP is stable.  He is worried about his bills as the payment  from the grant has not been made, and the staff is working on that. I will place him on Keflex 500 mg tid for 10 days.  I will see him  back in 3 weeks with CBC and CMP. He  denies signs of infections such as sore throat, sinus drainage, cough or urinary symptoms. He  denies fever or recurrent chills. He  also deny nausea, vomiting, chest pain, or dyspnea. His  appetite is alright and His  weight has increased 1 pounds over last 3 weeks  He is accompanied by his daughter at today's visit as always.     REVIEW OF SYSTEMS:  Review of Systems  Constitutional:  Positive for fatigue. Negative for appetite change, chills, diaphoresis, fever and unexpected weight change.  HENT:  Negative.  Negative for hearing loss, lump/mass, mouth sores, nosebleeds, sore throat, tinnitus, trouble swallowing and voice change.   Eyes: Negative.  Negative for eye problems and icterus.  Respiratory: Negative.  Negative for chest tightness, cough, hemoptysis, shortness of breath and wheezing.   Cardiovascular:  Negative.  Negative for chest pain, leg swelling and palpitations.  Gastrointestinal:  Positive for diarrhea. Negative for abdominal distention, abdominal pain, blood in stool, constipation, nausea, rectal pain and vomiting.  Endocrine: Negative.  Negative for hot flashes.  Genitourinary: Negative.  Negative for bladder incontinence, difficulty urinating, dyspareunia, dysuria, frequency, hematuria, nocturia, pelvic pain and penile discharge.   Musculoskeletal:  Negative for arthralgias, back pain, flank pain, gait problem, myalgias, neck pain and neck stiffness.  Skin: Negative.  Negative for itching, rash and wound.       keratotic skin lesions w/ erythema which are worsening this month  Neurological:  Positive for extremity weakness. Negative for dizziness, gait problem, headaches, light-headedness, numbness, seizures and speech difficulty.       Generalized weakness.  Hematological: Negative.  Negative for adenopathy. Does not bruise/bleed easily.  Psychiatric/Behavioral: Negative.  Negative for confusion, decreased concentration, depression, sleep disturbance and suicidal ideas. The patient is not nervous/anxious.      VITALS:  Blood pressure (!) 106/59, pulse 61, temperature 97.9 F (36.6 C), temperature source Oral, resp. rate 18, height 5\' 10"  (1.778 m), weight 150 lb 3.2 oz (68.1 kg), SpO2 96%.  Wt Readings from Last 3 Encounters:  12/30/22 150 lb (68 kg)  12/28/22 150 lb 3.2 oz (68.1 kg)  12/09/22 151 lb 0.2 oz (68.5 kg)    Body mass index is 21.55 kg/m.  Performance status (ECOG): 2 - Symptomatic, <50% confined to bed  PHYSICAL EXAM:  Physical Exam Vitals and nursing note reviewed. Exam conducted with a chaperone present.  Constitutional:      General: He is not in acute distress.    Appearance: Normal appearance. He is normal weight. He is not ill-appearing, toxic-appearing or diaphoretic.  HENT:     Head: Normocephalic and atraumatic.     Comments: Raised hard scaly area  on the left side of the nose, and a smaller area on the right. Scaly areas on the left maxillary area Keratotic areas on the ears but improved.     Right Ear: Tympanic membrane, ear canal and external ear normal. There is no impacted cerumen.     Left Ear: Tympanic membrane, ear canal and external ear normal. There is no impacted cerumen.     Nose: Nose normal. No congestion or rhinorrhea.     Mouth/Throat:     Mouth: Mucous membranes are moist.     Pharynx: Oropharynx is clear. No oropharyngeal exudate  or posterior oropharyngeal erythema.  Eyes:     General: No scleral icterus.       Right eye: No discharge.        Left eye: No discharge.     Extraocular Movements: Extraocular movements intact.     Conjunctiva/sclera: Conjunctivae normal.     Pupils: Pupils are equal, round, and reactive to light.  Neck:     Vascular: No carotid bruit.  Cardiovascular:     Rate and Rhythm: Normal rate and regular rhythm.     Pulses: Normal pulses.     Heart sounds: Murmur heard.     Systolic murmur is present with a grade of 2/6.     No friction rub. No gallop.  Pulmonary:     Effort: Pulmonary effort is normal. No respiratory distress.     Breath sounds: Normal breath sounds. No stridor. No wheezing, rhonchi or rales.  Chest:     Chest wall: No tenderness.  Abdominal:     General: Bowel sounds are normal. There is no distension.     Palpations: Abdomen is soft. There is no hepatomegaly, splenomegaly or mass.     Tenderness: There is no abdominal tenderness. There is no right CVA tenderness, left CVA tenderness, guarding or rebound.     Hernia: No hernia is present.  Musculoskeletal:        General: No swelling, tenderness, deformity or signs of injury. Normal range of motion.     Cervical back: Normal range of motion and neck supple. No rigidity or tenderness.     Right lower leg: 1+ Edema present.     Left lower leg: 1+ Edema present.     Comments: 1+ edema with some reaction to the anterior  tibia.  Scattered skin cancers  Evidence of venous stasis.  Lymphadenopathy:     Cervical: No cervical adenopathy.     Upper Body:     Right upper body: No supraclavicular or axillary adenopathy.     Left upper body: No supraclavicular or axillary adenopathy.  Skin:    General: Skin is warm and dry.     Coloration: Skin is not jaundiced or pale.     Findings: Lesion present. No bruising, erythema or rash.     Comments: The left side of his nose is raw from recent surgery, and he has diffuse erythema of the nose and left face consistent with facial cellulitis. Multiple skin cancers consistent with squamous  cell carcinoma of his cheek, nose, and neck.  He has a mole on the upper pinna of the left ear.  He has some lesions on the dorsum of his left hand   Neurological:     General: No focal deficit present.     Mental Status: He is alert and oriented to person, place, and time. Mental status is at baseline.     Cranial Nerves: No cranial nerve deficit.     Sensory: No sensory deficit.     Motor: No weakness.     Coordination: Coordination normal.     Gait: Gait normal.     Deep Tendon Reflexes: Reflexes normal.  Psychiatric:        Mood and Affect: Mood normal.        Behavior: Behavior normal.        Thought Content: Thought content normal.        Judgment: Judgment normal.    LABS:      Latest Ref Rng & Units 12/28/2022   12:00 AM  12/07/2022   12:00 AM 11/16/2022   12:00 AM  CBC  WBC  96.6     78.0     77.7      Hemoglobin 13.5 - 17.5 9.7     10.3     10.1      Hematocrit 41 - 53 32     33     32      Platelets 150 - 400 K/uL 228     191     187         This result is from an external source.      Latest Ref Rng & Units 12/28/2022   12:00 AM 12/07/2022   12:00 AM 11/16/2022   12:00 AM  CMP  BUN 4 - 21 23     22     24       Creatinine 0.6 - 1.3 0.8     0.9     0.8      Sodium 137 - 147 138     138     140      Potassium 3.5 - 5.1 mEq/L 4.3     4.2     3.3       Chloride 99 - 108 107     104     110      CO2 13 - 22 25     21     27       Calcium 8.7 - 10.7 8.3     8.5     8.8      Alkaline Phos 25 - 125 59     57     61      AST 14 - 40 24     23     29       ALT 10 - 40 U/L 15     16     20          This result is from an external source.   Component Ref Range & Units 09/14/2022 9 mo ago  Folate >5.9 ng/mL 29.0 VC 15.8 CM     Component Ref Range & Units 09/14/2022 9 mo ago  Transferrin Receptor 12.2 - 27.3 nmol/L 18.4 11.3 Low  CM   Component Ref Range & Units 09/14/2022 9 mo ago  Vitamin B-12 180 - 914 pg/mL 547 493 CM              Component Ref Range & Units 2 wk ago (11/16/22) 1 mo ago (10/26/22) 3 mo ago (08/24/22) 5 mo ago (07/08/22) 7 mo ago (04/14/22) 9 mo ago (03/03/22) 9 mo ago (02/10/22)  TSH 0.350 - 4.500 uIU/mL 1.474 1.888 CM 1.955 CM 1.748 CM 2.417 CM 2.615 CM 1.543 CM                  Component Ref Range & Units 2 wk ago (11/16/22) 1 mo ago (10/26/22) 3 mo ago (08/24/22) 5 mo ago (07/08/22) 7 mo ago (04/14/22) 9 mo ago (03/03/22) 9 mo ago (02/10/22)  T4, Total 4.5 - 12.0 ug/dL 8.2 7.6 CM 9.2 CM 8.0 CM 8.7 CM 8.1 CM 8.3 CM       No results found for: "CEA1", "CEA" / No results found for: "CEA1", "CEA" No results found for: "PSA1" No results found for: "ZHY865" No results found for: "CAN125"  No results found for: "TOTALPROTELP", "ALBUMINELP", "A1GS", "A2GS", "BETS", "BETA2SER", "GAMS", "MSPIKE", "SPEI" Lab Results  Component Value Date  TIBC 301 09/14/2022   TIBC 267 12/31/2021   TIBC 291 09/15/2021   FERRITIN 59 09/14/2022   FERRITIN 248 12/31/2021   FERRITIN 48 09/15/2021   IRONPCTSAT 13 (L) 09/14/2022   IRONPCTSAT 22 12/31/2021   IRONPCTSAT 13 (L) 09/15/2021   Lab Results  Component Value Date   LDH 166 08/03/2020    STUDIES:  No results found.    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 He has never used smokeless tobacco. He reports that he does not currently use alcohol. He reports that he does not use drugs.The patient is accompanied by his daughter today.  Allergies: No Known Allergies  Current Medications: Current Outpatient Medications  Medication Sig Dispense Refill   acetaminophen (TYLENOL) 500 MG tablet Take 1,000 mg by mouth every 6 (six) hours as needed for mild pain, moderate pain or fever.     amiodarone (PACERONE) 200 MG tablet Take 200 mg by mouth daily.     apixaban (ELIQUIS) 5 MG TABS tablet Take 1 tablet by mouth 2 (two) times daily.     Ascorbic Acid (VITAMIN C) 500 MG CAPS Take 500 mg by mouth daily.     benazepril (LOTENSIN) 20 MG tablet Take 20 mg by mouth daily.     bismuth subsalicylate (PEPTO BISMOL) 262 MG/15ML suspension Take 30 mLs by mouth every 6 (six) hours as needed. For constipation     cholecalciferol (VITAMIN D3) 25 MCG (1000 UNIT) tablet Take 1,000 Units by mouth daily.     cholestyramine (QUESTRAN) 4 g packet Take 1 packet by mouth 3 (three) times daily.     diphenoxylate-atropine (LOMOTIL) 2.5-0.025 MG tablet Take by mouth.     doxycycline (VIBRA-TABS) 100 MG tablet Take 100 mg by mouth daily.     furosemide (LASIX) 40 MG tablet Take 0.5 tablets (20 mg total) by mouth daily. 30 tablet 0   ketoconazole (NIZORAL) 2 % shampoo Apply topically 3 (three) times a week. (Patient not taking: Reported on 10/05/2022)      levothyroxine (SYNTHROID) 75 MCG tablet Take 75 mcg by mouth daily before breakfast.     metFORMIN (GLUCOPHAGE-XR) 500 MG 24 hr tablet Take 500 mg by mouth 2 (two) times daily.     midodrine (PROAMATINE) 5 MG tablet TAKE 1 TABLET (5 MG TOTAL) BY MOUTH 3 (THREE) TIMES DAILY WITH MEALS. 270 tablet 1   mupirocin ointment (BACTROBAN) 2 % Apply topically daily.     ofloxacin (OCUFLOX) 0.3 % ophthalmic solution 1 drop 4 (four) times daily.     Omega-3 Fatty Acids (FISH OIL) 1000 MG CAPS Take 1,000 mg by mouth daily.     ondansetron (ZOFRAN) 4 MG tablet Take 1 tablet (4 mg total) by mouth every 6 (six) hours as needed for nausea.  20 tablet 0   pantoprazole (PROTONIX) 40 MG tablet TAKE 1 TABLET BY MOUTH EVERY DAY (Patient not taking: Reported on 10/05/2022) 90 tablet 1   polyethylene glycol (MIRALAX / GLYCOLAX) 17 g packet Take 17 g by mouth daily as needed for mild constipation. 14 each 0   rOPINIRole (REQUIP) 0.5 MG tablet Take 0.5-1 mg by mouth See admin instructions. Taking one tablet at bedtime, if no relief take an additional tablet     rosuvastatin (CRESTOR) 20 MG tablet Take 10 mg by mouth daily.     vitamin B-12 (CYANOCOBALAMIN) 500 MCG tablet Take 500 mcg by mouth daily.     Current Facility-Administered Medications  Medication Dose Route Frequency Provider Last Rate Last Admin   sodium chloride flush (NS) 0.9 % injection 10 mL  10 mL Intracatheter PRN Mosher, Harvin Hazel A, PA-C   10 mL at 12/28/22 1541      I,Oluwatobi Asade,acting as a scribe for Dellia Beckwith, MD.,have documented all relevant documentation on the behalf of Dellia Beckwith, MD,as directed by  Dellia Beckwith, MD while in the presence of Dellia Beckwith, MD.

## 2023-01-18 NOTE — Assessment & Plan Note (Addendum)
Multiple skin cancers, especially covering his face, previously treated with Erivedge therapy.  He was switched to cemiplimab every 3 weeks in May 2023.  He had progressive worsening fatigue and debility, so cemiplimab was held in September 2023 and resumed in February 2024 overall, his lesions appear stable to improved.  Will proceed with cemiplimab this week.  We will plan to see him back in 3 weeks with a CBC and comprehensive metabolic panel prior to his next cycle.

## 2023-01-18 NOTE — Progress Notes (Signed)
[facility]  [address] 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  He was switched to cemiplimab every 3 weeks in May 2023.  He had progressive worsening fatigue and debility, so cemiplimab was held in September 2023 and resumed in February 2024 overall, his lesions appear stable to improved.  Will proceed with cemiplimab this week.  We will plan to see him back in 3 weeks with a CBC and comprehensive metabolic panel prior to his next cycle.  CLL (chronic lymphocytic leukemia) (HCC) He had a good response to acalabrutinib, but this was discontinued due to to GI bleeding. He had previous iron deficiency. He has had continued progression of his CLL since that time.  His white count is over 100,000 at this time.  He has worsening anemia without a significant increase in fatigue.  Platelets remain normal.  I am concerned that he may need treatment for his CLL soon. The patient would like to wait to discuss this with Dr. Gilman Buttner at his next visit in 3 weeks. He will have a CBC, CMP, iron panel, ferritin, B12 and folate at this time.  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 His daughter accompanies him today and states he has 6 watery stools per day. The patient has seen Dr. Jennye Boroughs for evaluation and treatment. Last week he recommended discontinuing metformin. So far there has been no change in the number or consistency of stools. He see Dr. Jennye Boroughs again on October 14.    The patient understands the plans discussed today and is in agreement with them.  He knows to contact our office if he develops concerns prior to his next appointment.   I provided 20 minutes of face-to-face  time during this encounter and > 50% was spent counseling as documented under my assessment and plan.    Adah Perl, PA-C  [facility] AT [facility] [address] [place] [address] Dept: [phone] Dept Fax: [phone]   No orders of the defined types were placed in this encounter.     CHIEF COMPLAINT:  CC: Basal cell skin cancer  Current Treatment: Cemiplimab every 3 weeks  HISTORY OF PRESENT ILLNESS:   Oncology History  CLL (chronic lymphocytic leukemia) (HCC)  05/25/2015 Initial Diagnosis   CLL (chronic lymphocytic leukemia) (HCC)   02/04/2021 Cancer Staging   Staging form: Chronic Lymphocytic Leukemia / Small Lymphocytic Lymphoma, AJCC 8th Edition - Clinical stage from 02/04/2021: Modified Rai Stage III (Modified Rai risk: High, Binet: Stage A, Lymphocytosis: Present, Adenopathy: Absent, Organomegaly: Absent, Anemia: Present, Thrombocytopenia: Absent) - Signed by Dellia Beckwith, MD on 02/08/2021 Stage prefix: Recurrence Absolute lymphocyte count (ALC) (cells/uL): 100 Hemoglobin (Hgb) (g/dL): 10.2 Platelet count (x10E9/L of blood): 184   Cancer of the skin, basal cell  05/06/2021 Initial Diagnosis   Cancer of the skin, basal cell   08/30/2021 - 12/31/2021 Chemotherapy   Patient is on Treatment Plan : SQUAMOUS SKIN Cemiplimab q21d     08/30/2021 - 01/21/2022 Chemotherapy   Patient is on Treatment Plan : ADVANCED CUTANEOUS SQUAMOUS CELL CARCINOMA Cemiplimab q21d     07/15/2022 -  Chemotherapy   Patient is on Treatment Plan : ADVANCED CUTANEOUS SQUAMOUS  CELL CARCINOMA Cemiplimab q21d     BCC (basal cell carcinoma), face  12/28/2016 Initial Diagnosis   BCC (basal cell carcinoma), face   07/15/2022 -  Chemotherapy   Patient is on Treatment Plan : ADVANCED CUTANEOUS SQUAMOUS CELL CARCINOMA Cemiplimab q21d         INTERVAL HISTORY:  Fernando Young is here today for repeat clinical assessment and continues to report  severe diarrhea with 6 watery stools daily. He is seeing Dr. Jennye Boroughs for this. He reports fatigue and leg weakness. He has chronic lower extremity edema. He denies fevers or chills. He denies pain. His appetite is good. His weight has decreased 1 pounds over last 3 weeks . Unfortunately, he is unable to get around easily which affects his quality of life.  REVIEW OF SYSTEMS:  Review of Systems  Constitutional:  Positive for fatigue. Negative for appetite change, chills, fever and unexpected weight change.  HENT:   Negative for lump/mass, mouth sores and sore throat.   Respiratory:  Negative for cough and shortness of breath.   Cardiovascular:  Positive for leg swelling. Negative for chest pain.  Gastrointestinal:  Positive for diarrhea. Negative for abdominal pain, constipation, nausea and vomiting.  Genitourinary:  Negative for difficulty urinating, dysuria, frequency and hematuria.   Musculoskeletal:  Negative for arthralgias, back pain and myalgias.  Skin:  Negative for itching, rash and wound.  Neurological:  Positive for extremity weakness. Negative for dizziness, headaches, light-headedness and numbness.  Hematological:  Negative for adenopathy.  Psychiatric/Behavioral:  Negative for depression and sleep disturbance. The patient is not nervous/anxious.      VITALS:  Blood pressure 98/61, pulse 65, temperature (!) 97.5 F (36.4 C), temperature source Oral, resp. rate 20, height 5\' 10"  (1.778 m), weight 149 lb (67.6 kg), SpO2 96%.  Wt Readings from Last 3 Encounters:  01/20/23 150 lb (68 kg)  01/18/23 149 lb (67.6 kg)  12/30/22 150 lb (68 kg)    Body mass index is 21.38 kg/m.  Performance status (ECOG): 2 - Symptomatic, <50% confined to bed  PHYSICAL EXAM:  Physical Exam Vitals and nursing note reviewed.  Constitutional:      General: He is not in acute distress.    Appearance: He is normal weight. He is ill-appearing (Chronically ill-appearing).     Comments: He is in a  wheelchair  HENT:     Head: Normocephalic and atraumatic.     Mouth/Throat:     Mouth: Mucous membranes are moist.     Pharynx: Oropharynx is clear. No oropharyngeal exudate or posterior oropharyngeal erythema.  Eyes:     General: No scleral icterus.    Extraocular Movements: Extraocular movements intact.     Conjunctiva/sclera: Conjunctivae normal.     Pupils: Pupils are equal, round, and reactive to light.  Cardiovascular:     Rate and Rhythm: Normal rate and regular rhythm.     Heart sounds: Normal heart sounds. No murmur heard.    No friction rub. No gallop.  Pulmonary:     Effort: Pulmonary effort is normal.     Breath sounds: Normal breath sounds. No wheezing, rhonchi or rales.  Abdominal:     General: Bowel sounds are normal. There is no distension.     Palpations: Abdomen is soft. There is no mass.     Tenderness: There is no abdominal tenderness.  Musculoskeletal:        General: Normal range of motion.     Cervical back: Normal range of motion and neck supple.  No tenderness.     Right lower leg: Edema (1-2+) present.     Left lower leg: Edema (1-2+) present.  Lymphadenopathy:     Cervical: No cervical adenopathy.     Upper Body:     Right upper body: No supraclavicular or axillary adenopathy.     Left upper body: No supraclavicular or axillary adenopathy.  Skin:    General: Skin is warm and dry.     Coloration: Skin is not jaundiced.     Findings: No rash.  Neurological:     Mental Status: He is alert and oriented to person, place, and time.     Cranial Nerves: No cranial nerve deficit.  Psychiatric:        Mood and Affect: Mood normal.        Behavior: Behavior normal.        Thought Content: Thought content normal.     LABS:      Latest Ref Rng & Units 01/18/2023   12:00 AM 12/28/2022   12:00 AM 12/07/2022   12:00 AM  CBC  WBC  111.7     96.6     78.0      Hemoglobin 13.5 - 17.5 9.4     9.7     10.3      Hematocrit 41 - 53 31     32     33       Platelets 150 - 400 K/uL 219     228     191         This result is from an external source.      Latest Ref Rng & Units 01/18/2023   12:00 AM 12/28/2022   12:00 AM 12/07/2022   12:00 AM  CMP  BUN 4 - 21 23     23     22       Creatinine 0.6 - 1.3 0.9     0.8     0.9      Sodium 137 - 147 138     138     138      Potassium 3.5 - 5.1 mEq/L 3.7     4.3     4.2      Chloride 99 - 108 110     107     104      CO2 13 - 22 24     25     21       Calcium 8.7 - 10.7 8.2     8.3     8.5      Alkaline Phos 25 - 125 59     59     57      AST 14 - 40 22     24     23       ALT 10 - 40 U/L 15     15     16          This result is from an external source.    Lab Results  Component Value Date   TIBC 301 09/14/2022   TIBC 267 12/31/2021   TIBC 291 09/15/2021   FERRITIN 59 09/14/2022   FERRITIN 248 12/31/2021   FERRITIN 48 09/15/2021   IRONPCTSAT 13 (L) 09/14/2022   IRONPCTSAT 22 12/31/2021   IRONPCTSAT 13 (L) 09/15/2021   Lab Results  Component Value Date   LDH 166 08/03/2020    STUDIES:  No results found.    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 He has never used smokeless tobacco. He reports that he does not currently use alcohol. He reports that he does not use drugs.The patient is accompanied by his daughter today.  Allergies: No Known  Allergies  Current Medications: Current Outpatient Medications  Medication Sig Dispense Refill   acetaminophen (TYLENOL) 500 MG tablet Take 1,000 mg by mouth every 6 (six) hours as needed for mild pain, moderate pain or fever.     amiodarone (PACERONE) 200 MG tablet Take 200 mg by mouth daily.     apixaban (ELIQUIS) 5 MG TABS tablet Take 1 tablet by mouth 2 (two) times daily.     Ascorbic Acid (VITAMIN C) 500 MG CAPS Take 500 mg by mouth daily.     benazepril (LOTENSIN) 20 MG tablet Take 20 mg by mouth daily.     bismuth subsalicylate (PEPTO BISMOL) 262 MG/15ML suspension Take 30 mLs by mouth every 6 (six) hours as needed. For constipation     cholecalciferol (VITAMIN D3) 25 MCG (1000 UNIT) tablet Take 1,000 Units by mouth daily.     cholestyramine (QUESTRAN) 4 g packet Take 1 packet by mouth 3 (three) times daily.     diphenoxylate-atropine (LOMOTIL) 2.5-0.025 MG tablet Take by mouth.     furosemide (LASIX) 40 MG tablet Take 0.5 tablets (20 mg total) by mouth daily. 30 tablet 0   ketoconazole (NIZORAL) 2 % shampoo Apply topically 3 (three) times a week. (Patient not taking: Reported on 10/05/2022)     levothyroxine (SYNTHROID) 75 MCG tablet Take 75 mcg by mouth daily before breakfast.     metFORMIN (GLUCOPHAGE-XR) 500 MG 24 hr tablet Take 500 mg by mouth 2 (two) times daily. (Patient not taking: Reported on 01/18/2023)     midodrine (PROAMATINE) 5 MG tablet TAKE 1 TABLET (5 MG TOTAL) BY MOUTH 3 (THREE) TIMES DAILY WITH MEALS. 270 tablet 1   mupirocin ointment (BACTROBAN) 2 % Apply topically daily. (Patient not taking: Reported on 01/18/2023)     Omega-3 Fatty Acids (FISH OIL) 1000 MG CAPS Take 1,000 mg by mouth daily. (Patient not taking: Reported on 01/18/2023)     ondansetron (ZOFRAN) 4 MG tablet Take 1 tablet (4 mg total) by mouth every 6 (six) hours as needed for nausea. 20 tablet 0   pantoprazole (PROTONIX) 40 MG tablet TAKE 1 TABLET BY MOUTH EVERY DAY (Patient not taking: Reported on  10/05/2022) 90 tablet 1   polyethylene glycol (MIRALAX / GLYCOLAX) 17 g packet Take 17 g by mouth daily as needed for mild constipation. (Patient not taking: Reported on 01/18/2023) 14 each 0   rOPINIRole (REQUIP) 0.5 MG tablet Take 0.5-1 mg by mouth See admin instructions. Taking one tablet at bedtime, if no relief take an  additional tablet     rosuvastatin (CRESTOR) 20 MG tablet Take 10 mg by mouth daily.     vitamin B-12 (CYANOCOBALAMIN) 500 MCG tablet Take 500 mcg by mouth daily. (Patient not taking: Reported on 01/18/2023)     Current Facility-Administered Medications  Medication Dose Route Frequency Provider Last Rate Last Admin   sodium chloride flush (NS) 0.9 % injection 10 mL  10 mL Intracatheter PRN Durant Scibilia A, PA-C   10 mL at 01/18/23 1505

## 2023-01-18 NOTE — Progress Notes (Signed)
CRITICAL VALUE STICKER  CRITICAL VALUE:  WBC 111.7  RECEIVER (on-site recipient of call):  Dyane Dustman RN  DATE & TIME NOTIFIED:   01/18/2023  MESSENGER (representative from lab):  Verdon Cummins [facility] lab  MD NOTIFIED:   Belva Crome PA   TIME OF NOTIFICATION:  1538  RESPONSE:   Patient scheduled to at 1530

## 2023-01-19 LAB — T4: T4, Total: 7.5 ug/dL (ref 4.5–12.0)

## 2023-01-20 ENCOUNTER — Inpatient Hospital Stay: Payer: Medicare Other

## 2023-01-20 VITALS — BP 103/61 | HR 64 | Temp 98.6°F | Resp 12 | Ht 70.0 in | Wt 150.0 lb

## 2023-01-20 DIAGNOSIS — C4491 Basal cell carcinoma of skin, unspecified: Secondary | ICD-10-CM

## 2023-01-20 DIAGNOSIS — C44310 Basal cell carcinoma of skin of unspecified parts of face: Secondary | ICD-10-CM

## 2023-01-20 DIAGNOSIS — Z5112 Encounter for antineoplastic immunotherapy: Secondary | ICD-10-CM | POA: Diagnosis not present

## 2023-01-20 MED ORDER — SODIUM CHLORIDE 0.9 % IV SOLN: Freq: Once | INTRAVENOUS | Status: AC

## 2023-01-20 MED ORDER — HEPARIN SOD (PORK) LOCK FLUSH 100 UNIT/ML IV SOLN
500.0000 [IU] | Freq: Once | INTRAVENOUS | Status: AC | PRN
  Administered 2023-01-20: 500 [IU]

## 2023-01-20 MED ORDER — SODIUM CHLORIDE 0.9% FLUSH
10.0000 mL | INTRAVENOUS | Status: DC | PRN
  Administered 2023-01-20: 10 mL

## 2023-01-20 MED ORDER — SODIUM CHLORIDE 0.9 % IV SOLN
350.0000 mg | Freq: Once | INTRAVENOUS | Status: AC
  Administered 2023-01-20: 350 mg via INTRAVENOUS
  Filled 2023-01-20: qty 7

## 2023-01-20 NOTE — Patient Instructions (Signed)
Cemiplimab Injection What is this medication? CEMIPLIMAB (se MIP li mab) treats skin cancer and lung cancer. It works by helping your immune system slow or stop the spread of cancer cells. It is a monoclonal antibody. This medicine may be used for other purposes; ask your health care provider or pharmacist if you have questions. COMMON BRAND NAME(S): LIBTAYO What should I tell my care team before I take this medication? They need to know if you have any of these conditions: Allogeneic stem cell transplant (uses someone else's stem cells) Autoimmune diseases, such as Crohn disease, ulcerative colitis, lupus History of chest radiation Nervous system problems, such as Guillain-Barre syndrome or myasthenia gravis Organ transplant An unusual or allergic reaction to cemiplimab, other medications, foods, dyes, or preservatives Pregnant or trying to get pregnant Breastfeeding How should I use this medication? This medication is infused into a vein. It is given by your care team in a hospital or clinic setting. A special MedGuide will be given to you before each treatment. Be sure to read this information carefully each time. Talk to your care team about the use of this medication in children. Special care may be needed. Overdosage: If you think you have taken too much of this medicine contact a poison control center or emergency room at once. NOTE: This medicine is only for you. Do not share this medicine with others. What if I miss a dose? Keep appointments for follow-up doses. It is important not to miss your dose. Call your care team if you are unable to keep an appointment. What may interact with this medication? Interactions have not been studied. This list may not describe all possible interactions. Give your health care provider a list of all the medicines, herbs, non-prescription drugs, or dietary supplements you use. Also tell them if you smoke, drink alcohol, or use illegal drugs. Some  items may interact with your medicine. What should I watch for while using this medication? Visit your care team for regular checks on your progress. It may be some time before you see the benefit from this medication. You may need blood work done while taking this medication. This medication may cause serious skin reactions. They can happen weeks to months after starting the medication. Contact your care team right away if you notice fevers or flu-like symptoms with a rash. The rash may be red or purple and then turn into blisters or peeling of the skin. You may also notice a red rash with swelling of the face, lips, or lymph nodes in your neck or under your arms. Tell your care team right away if you have any change in your eyesight. Talk to your care team if you may be pregnant. You will need a negative pregnancy test before starting this medication. Contraception is recommended while taking this medication and for 4 months after the last dose. Your care team can help you find the option that works for you. Do not breastfeed while taking this medication and for at least 4 months after the last dose. What side effects may I notice from receiving this medication? Side effects that you should report to your care team as soon as possible: Allergic reactions--skin rash, itching, hives, swelling of the face, lips, tongue, or throat Dry cough, shortness of breath or trouble breathing Eye pain, redness, irritation, or discharge with blurry or decreased vision Heart muscle inflammation--unusual weakness or fatigue, shortness of breath, chest pain, fast or irregular heartbeat, dizziness, swelling of the ankles, feet, or hands  Hormone gland problems--headache, sensitivity to light, unusual weakness or fatigue, dizziness, fast or irregular heartbeat, increased sensitivity to cold or heat, excessive sweating, constipation, hair loss, increased thirst or amount of urine, tremors or shaking, irritability Infusion  reactions--chest pain, shortness of breath or trouble breathing, feeling faint or lightheaded Kidney injury (glomerulonephritis)--decrease in the amount of urine, red or dark brown urine, foamy or bubbly urine, swelling of the ankles, hands, or feet Liver injury--right upper belly pain, loss of appetite, nausea, light-colored stool, dark yellow or brown urine, yellowing skin or eyes, unusual weakness or fatigue Pain, tingling, or numbness in the hands or feet, muscle weakness, change in vision, confusion or trouble speaking, loss of balance or coordination, trouble walking, seizures Rash, fever, and swollen lymph nodes Redness, blistering, peeling, or loosening of the skin, including inside the mouth Sudden or severe stomach pain, bloody diarrhea, fever, nausea, vomiting Side effects that usually do not require medical attention (report these to your care team if they continue or are bothersome): Bone, joint, or muscle pain Diarrhea Fatigue Loss of appetite Nausea Skin rash This list may not describe all possible side effects. Call your doctor for medical advice about side effects. You may report side effects to FDA at 1-800-FDA-1088. Where should I keep my medication? This medication is given in a hospital or clinic and will not be stored at home. NOTE: This sheet is a summary. It may not cover all possible information. If you have questions about this medicine, talk to your doctor, pharmacist, or health care provider.  2024 [employer] (2022-06-06 00:00:00)

## 2023-01-20 NOTE — Assessment & Plan Note (Addendum)
He had a good response to acalabrutinib, but this was discontinued due to to GI bleeding. He had previous iron deficiency. He has had continued progression of his CLL since that time.  His white count is over 100,000 at this time.  He has worsening anemia without a significant increase in fatigue.  Platelets remain normal.  I am concerned that he may need treatment for his CLL soon. The patient would like to wait to discuss this with Dr. Gilman Buttner at his next visit in 3 weeks. He will have a CBC, CMP, iron panel, ferritin, B12 and folate at this time.

## 2023-01-21 ENCOUNTER — Encounter: Payer: Self-pay | Admitting: Hematology and Oncology

## 2023-01-21 ENCOUNTER — Encounter: Payer: Self-pay | Admitting: Oncology

## 2023-01-21 ENCOUNTER — Other Ambulatory Visit: Payer: Self-pay

## 2023-01-21 DIAGNOSIS — R197 Diarrhea, unspecified: Secondary | ICD-10-CM | POA: Insufficient documentation

## 2023-01-21 HISTORY — DX: Diarrhea, unspecified: R19.7

## 2023-01-21 NOTE — Assessment & Plan Note (Signed)
He has had chronic severe diarrhea possibly due to medication effects. His daughter accompanies him today and states he has 6 watery stools per day. The patient has seen Dr. Jennye Boroughs for evaluation and treatment. Last week he recommended discontinuing metformin. So far there has been no change in the number or consistency of stools. He see Dr. Jennye Boroughs again on October 14.

## 2023-02-03 ENCOUNTER — Inpatient Hospital Stay: Payer: Medicare Other | Attending: Oncology

## 2023-02-03 ENCOUNTER — Inpatient Hospital Stay: Payer: Medicare Other | Admitting: Oncology

## 2023-02-03 ENCOUNTER — Telehealth: Payer: Self-pay

## 2023-02-03 DIAGNOSIS — C44310 Basal cell carcinoma of skin of unspecified parts of face: Secondary | ICD-10-CM

## 2023-02-03 DIAGNOSIS — Z5112 Encounter for antineoplastic immunotherapy: Secondary | ICD-10-CM | POA: Diagnosis present

## 2023-02-03 DIAGNOSIS — C4491 Basal cell carcinoma of skin, unspecified: Secondary | ICD-10-CM | POA: Diagnosis present

## 2023-02-03 DIAGNOSIS — Z452 Encounter for adjustment and management of vascular access device: Secondary | ICD-10-CM | POA: Diagnosis not present

## 2023-02-03 DIAGNOSIS — Z79620 Long term (current) use of immunosuppressive biologic: Secondary | ICD-10-CM | POA: Insufficient documentation

## 2023-02-03 DIAGNOSIS — D509 Iron deficiency anemia, unspecified: Secondary | ICD-10-CM

## 2023-02-03 DIAGNOSIS — C9110 Chronic lymphocytic leukemia of B-cell type not having achieved remission: Secondary | ICD-10-CM

## 2023-02-03 DIAGNOSIS — Z23 Encounter for immunization: Secondary | ICD-10-CM | POA: Insufficient documentation

## 2023-02-03 LAB — COMPREHENSIVE METABOLIC PANEL
Albumin: 3.3 — AB (ref 3.5–5.0)
Calcium: 9.3 (ref 8.7–10.7)

## 2023-02-03 LAB — VITAMIN B12: Vitamin B-12: 379 pg/mL (ref 180–914)

## 2023-02-03 LAB — BASIC METABOLIC PANEL
BUN: 14 (ref 4–21)
CO2: 27 — AB (ref 13–22)
Chloride: 103 (ref 99–108)
Creatinine: 0.8 (ref 0.6–1.3)
Glucose: 163
Potassium: 4 meq/L (ref 3.5–5.1)
Sodium: 134 — AB (ref 137–147)

## 2023-02-03 LAB — HEPATIC FUNCTION PANEL
ALT: 144 U/L — AB (ref 10–40)
AST: 48 — AB (ref 14–40)
Alkaline Phosphatase: 201 — AB (ref 25–125)
Bilirubin, Total: 1

## 2023-02-03 LAB — CBC AND DIFFERENTIAL
HCT: 30 — AB (ref 41–53)
Hemoglobin: 8.9 — AB (ref 13.5–17.5)
Neutrophils Absolute: 11.16
Platelets: 237 10*3/uL (ref 150–400)
WBC: 124

## 2023-02-03 LAB — TSH: TSH: 4.187 u[IU]/mL (ref 0.350–4.500)

## 2023-02-03 LAB — IRON AND TIBC
Iron: 40 ug/dL — ABNORMAL LOW (ref 45–182)
Saturation Ratios: 18 % (ref 17.9–39.5)
TIBC: 227 ug/dL — ABNORMAL LOW (ref 250–450)
UIBC: 187 ug/dL

## 2023-02-03 LAB — FOLATE: Folate: 17.4 ng/mL (ref >5.9)

## 2023-02-03 LAB — FERRITIN: Ferritin: 146 ng/mL (ref 24–336)

## 2023-02-03 LAB — CBC: RBC: 3.13 — AB (ref 3.87–5.11)

## 2023-02-03 MED ORDER — HEPARIN SOD (PORK) LOCK FLUSH 100 UNIT/ML IV SOLN
500.0000 [IU] | Freq: Once | INTRAVENOUS | Status: AC | PRN
  Administered 2023-02-03: 500 [IU]

## 2023-02-03 MED ORDER — SODIUM CHLORIDE 0.9% FLUSH
10.0000 mL | INTRAVENOUS | Status: DC | PRN
  Administered 2023-02-03: 10 mL

## 2023-02-03 NOTE — Telephone Encounter (Signed)
CRITICAL VALUE STICKER  CRITICAL VALUE:  WBC 124.0  RECEIVER (on-site recipient of call): Angelene Rome,RN  DATE & TIME NOTIFIED:  02/03/2023 @ 1549  MESSENGER (representative from lab): Durward Mallard, from [facility] lab  MD NOTIFIED: Dr Gilman Buttner  TIME OF NOTIFICATION: 1600  RESPONSE:  Dr Gilman Buttner in room with pt.

## 2023-02-03 NOTE — Progress Notes (Signed)
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  He received 8 cycles with improvement in his skin lesions.  Unfortunately, he has had progressive worsening fatigue and debility and so we stopped his infusions in September but have now resumed them as of February, 2024.  The lesions of his skin fluctuate but are much better today.   CLL (chronic lymphocytic leukemia) (HCC) We held off on treatment of his CLL due to multiple medical problems.  He had worsening anemia, requiring occasional transfusion.  This is likely due to his CLL, as he received IV iron his iron deficiency. He started acalabrutinib 100 mg twice daily for his CLL on July 21st, 2023. He has had an increase in his white blood cell count since starting acalabrutinib.  An intial increase in the lymphocytosis is common with acalabrutinib.  His white blood cell count has decreased somewhat from 222,000 to 212,000.  He has persistent anemia without definite nutritional deficiency, so this was felt to be due to his CLL.  He then reported black stool.  He was started on pantoprazole 40 mg daily.  He had worsening diarrhea and stool studies revealed enteropathogenic E. Coli, which was treated with ciprofloxacin.   He had persistent melena and stool Hemoccults x3 were positive, so alcalabrutinib was discontinued on September 15th, 2023.  His white blood cell count decreased to 109,900 and his hemoglobin improved to 9.2 on September 21. His WBC came down to  15,000 by November, 2023. Since stopping treatment, his white count is rising again to 49,000, then 57,000 in January, 2024, 58,000 in February, and 68,000 in March. His WBC then leveled off for the last two checkups but is increased to 89,000 in June, 2024 but down to 77,700 in July, 2024 and stable at 78,000 in August.  I think the recent increase to 96,000 was related to his facial cellulitis but it continues to steadily go up.    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 He was transfused in August of 2023, when his hemoglobin dropped to 8 and he was very symptomatic. He last received IV iron replacement in June.  He required transfusion in August when his hemoglobin dropped to 8 and he was symptomatic. His hemoglobin has been better since December. It was up to 10.4 in February and went down to 9.4 it has now been 9.6 and he was improving by a small amount with every visit. It has decreased to 8.9 today, I will repeat iron studies next time.   Plan:  He is going to the wound clinic for several draining leg wounds and has compression wraps on today. His day 1 cycle 11 of Libtayo is scheduled on 02/10/2023. His WBC increased from 111.7 to 124.0, his hemoglobin has dropped from 9.4 to 8.9, and his platelet count Is 237,000 as of today. I informed him that his leukemia is slowly acting up again. He has a mildly low sodium of 124 and total protein of 5.9. He  has a elevated ALT of 144 and alkaline phosphate of 201. I will see him back the first week of November with CBC, CMP, iron studies, and Libtayo infusion. The patient understands the plans discussed today and is in agreement with them.  He knows to contact our office if he develops concerns prior to his next appointment.   I provided 20 minutes of face-to-face time during this encounter and > 50% was spent counseling as documented under my assessment and plan.    Dellia Beckwith, MD  [facility] [address] [place] [address] Dept: [phone] Dept Fax: [phone]   No orders of the defined types were placed in this encounter.     CHIEF COMPLAINT:  CC: Basal cell skin cancer  Current Treatment: Cemiplimab every 3 weeks  HISTORY OF PRESENT ILLNESS:   Oncology History  CLL (chronic lymphocytic leukemia) (HCC)  05/25/2015 Initial Diagnosis   CLL (chronic lymphocytic leukemia) (HCC)   02/04/2021 Cancer Staging   Staging form: Chronic Lymphocytic Leukemia / Small Lymphocytic Lymphoma, AJCC 8th Edition - Clinical stage from 02/04/2021: Modified Rai Stage III (Modified Rai risk: High, Binet: Stage A, Lymphocytosis: Present, Adenopathy: Absent, Organomegaly: Absent, Anemia: Present, Thrombocytopenia: Absent) - Signed by Dellia Beckwith, MD on 02/08/2021 Stage prefix: Recurrence Absolute lymphocyte count (ALC) (cells/uL): 100 Hemoglobin (Hgb) (g/dL): 10.2 Platelet count (x10E9/L of blood): 184   Cancer of the skin, basal cell  05/06/2021 Initial Diagnosis   Cancer of the skin, basal cell   08/30/2021 - 12/31/2021 Chemotherapy   Patient is on Treatment Plan : SQUAMOUS SKIN Cemiplimab q21d     08/30/2021 - 01/21/2022 Chemotherapy   Patient is on Treatment Plan : ADVANCED CUTANEOUS SQUAMOUS CELL CARCINOMA Cemiplimab q21d     07/15/2022 -  Chemotherapy   Patient is on Treatment Plan : ADVANCED CUTANEOUS SQUAMOUS CELL CARCINOMA Cemiplimab q21d     BCC (basal cell carcinoma), face  12/28/2016 Initial Diagnosis   BCC (basal cell carcinoma), face   07/15/2022 -  Chemotherapy   Patient is on Treatment Plan : ADVANCED CUTANEOUS SQUAMOUS CELL CARCINOMA Cemiplimab q21d         INTERVAL HISTORY:  Montrelle is here today for repeat clinical assessment after stopping acalabrutinib for his CLL in September, 2023. His CLL has just been very slowly progressive. He also has Squamous cell skin cancers and basal cell carcinomas, predominantly of  his face. Patient states that he feels ok and he has no complaints of pain. He is going to the wound clinic for several draining leg wounds and has compression wraps on today. His day 1 cycle 11 of Libtayo is scheduled on 02/10/2023. His WBC increased from 111.7 to 124.0, his hemoglobin has dropped from 9.4 to 8.9, and his platelet count Is 237,000 as of today. I informed him that his leukemia is slowly acting up again. He has a mildly low sodium of 124 and total protein of 5.9. He has a elevated ALT of 144 and alkaline phosphate of 201. I will see him back the first week of November with CBC, CMP, iron studies, and Libtayo infusion.   He denies signs of infection such as sore throat, sinus drainage, cough, or urinary symptoms.  He denies fevers or recurrent chills. He denies pain. He denies nausea, vomiting, chest pain, dyspnea or cough. His appetite is good . He weight was not taken today as he is in a wheelchair. He is accompanied by his  daughter at today's visit.   REVIEW OF SYSTEMS:  Review of Systems  Constitutional:  Positive for fatigue. Negative for appetite change, chills, diaphoresis, fever and unexpected weight change.  HENT:  Negative.  Negative for hearing loss, lump/mass, mouth sores, nosebleeds, sore throat, tinnitus, trouble swallowing and voice change.   Eyes: Negative.  Negative for eye problems and icterus.  Respiratory: Negative.  Negative for chest tightness, cough, hemoptysis, shortness of breath and wheezing.   Cardiovascular:  Positive for leg swelling. Negative for chest pain and palpitations.  Gastrointestinal:  Positive for diarrhea. Negative for abdominal distention, abdominal pain, blood in stool, constipation, nausea, rectal pain and vomiting.  Endocrine: Negative.  Negative for hot flashes.  Genitourinary: Negative.  Negative for bladder incontinence, difficulty urinating, dyspareunia, dysuria, frequency, hematuria, nocturia, pelvic pain and penile discharge.    Musculoskeletal:  Negative for arthralgias, back pain, flank pain, gait problem, myalgias, neck pain and neck stiffness.  Skin: Negative.  Negative for itching, rash and wound.  Neurological:  Positive for extremity weakness. Negative for dizziness, gait problem, headaches, light-headedness, numbness, seizures and speech difficulty.  Hematological: Negative.  Negative for adenopathy. Does not bruise/bleed easily.  Psychiatric/Behavioral: Negative.  Negative for confusion, decreased concentration, depression, sleep disturbance and suicidal ideas. The patient is not nervous/anxious.      VITALS:  There were no vitals taken for this visit.  Wt Readings from Last 3 Encounters:  02/10/23 150 lb (68 kg)  01/20/23 150 lb (68 kg)  01/18/23 149 lb (67.6 kg)    There is no height or weight on file to calculate BMI.  Performance status (ECOG): 2 - Symptomatic, <50% confined to bed  PHYSICAL EXAM:  Physical Exam Vitals and nursing note reviewed.  Constitutional:      General: He is not in acute distress.    Appearance: Normal appearance. He is normal weight. He is ill-appearing (Chronically ill-appearing). He is not toxic-appearing or diaphoretic.     Comments: He is in a wheelchair  HENT:     Head: Normocephalic and atraumatic.     Comments: Area of erythema over the left maxillary area but his skin looks much better over all.    Right Ear: Tympanic membrane, ear canal and external ear normal. There is no impacted cerumen.     Left Ear: Tympanic membrane, ear canal and external ear normal. There is no impacted cerumen.     Nose: Nose normal. No congestion or rhinorrhea.     Comments: Raised area next to the left side of his nose, improved    Mouth/Throat:     Mouth: Mucous membranes are moist.     Pharynx: Oropharynx is clear. No oropharyngeal exudate or posterior oropharyngeal erythema.  Eyes:     General: No scleral icterus.       Right eye: No discharge.        Left eye: No discharge.      Extraocular Movements: Extraocular movements intact.     Conjunctiva/sclera: Conjunctivae normal.     Pupils: Pupils are equal, round, and reactive to light.  Neck:     Vascular: No carotid bruit.  Cardiovascular:     Rate and Rhythm: Normal rate and regular rhythm.     Pulses: Normal pulses.     Heart sounds: Normal heart sounds. No murmur heard.    No friction rub. No gallop.  Pulmonary:     Effort: Pulmonary effort is normal. No respiratory distress.     Breath sounds: Normal breath sounds. No  stridor. No wheezing, rhonchi or rales.  Chest:     Chest wall: No tenderness.  Abdominal:     General: Bowel sounds are normal. There is no distension.     Palpations: Abdomen is soft. There is no hepatomegaly, splenomegaly or mass.     Tenderness: There is no abdominal tenderness. There is no right CVA tenderness, left CVA tenderness, guarding or rebound.     Hernia: No hernia is present.  Musculoskeletal:        General: No swelling, tenderness, deformity or signs of injury. Normal range of motion.     Cervical back: Normal range of motion and neck supple. No rigidity or tenderness.     Right lower leg: Edema (1-2+) present.     Left lower leg: Edema (1-2+) present.  Lymphadenopathy:     Cervical: No cervical adenopathy.     Upper Body:     Right upper body: No supraclavicular or axillary adenopathy.     Left upper body: No supraclavicular or axillary adenopathy.     Lower Body: No right inguinal adenopathy. No left inguinal adenopathy.  Skin:    General: Skin is warm and dry.     Coloration: Skin is not jaundiced or pale.     Findings: Wound present. No bruising, erythema, lesion or rash.  Neurological:     General: No focal deficit present.     Mental Status: He is alert and oriented to person, place, and time. Mental status is at baseline.     Cranial Nerves: No cranial nerve deficit.     Sensory: No sensory deficit.     Motor: No weakness.     Coordination: Coordination  normal.     Gait: Gait normal.     Deep Tendon Reflexes: Reflexes normal.  Psychiatric:        Mood and Affect: Mood normal.        Behavior: Behavior normal.        Thought Content: Thought content normal.        Judgment: Judgment normal.     LABS:      Latest Ref Rng & Units 02/03/2023    3:12 PM 01/18/2023   12:00 AM 12/28/2022   12:00 AM  CBC  WBC  124.0     111.7     96.6      Hemoglobin 13.5 - 17.5 8.9     9.4     9.7      Hematocrit 41 - 53 30     31     32      Platelets 150 - 400 K/uL 237     219     228         This result is from an external source.      Latest Ref Rng & Units 02/03/2023    3:12 PM 01/18/2023   12:00 AM 12/28/2022   12:00 AM  CMP  BUN 4 - 21 14     23     23       Creatinine 0.6 - 1.3 0.8     0.9     0.8      Sodium 137 - 147 134     138     138      Potassium 3.5 - 5.1 mEq/L 4.0     3.7     4.3      Chloride 99 - 108 103     110     107  CO2 13 - 22 27     24     25       Calcium 8.7 - 10.7 9.3     8.2     8.3      Alkaline Phos 25 - 125 201     59     59      AST 14 - 40 48     22     24      ALT 10 - 40 U/L 144     15     15         This result is from an external source.   Ref Range & Units 2 wk ago (01/18/23) 1 mo ago (12/28/22) 1 mo ago (12/07/22) 2 mo ago (11/16/22) 3 mo ago (10/26/22) 5 mo ago (08/24/22) 7 mo ago (07/08/22)  T4, Total 4.5 - 12.0 ug/dL 7.5 8.1 CM 8.2 CM 8.2 CM 7.6 CM 9.2 CM 8.0 C   Component Ref Range & Units 2 wk ago (01/18/23) 1 mo ago (12/28/22) 1 mo ago (12/07/22) 2 mo ago (11/16/22) 3 mo ago (10/26/22) 5 mo ago (08/24/22) 7 mo ago (07/08/22)  TSH 0.350 - 4.500 uIU/mL 3.505 3.051 CM 1.957 CM 1.474 CM 1.888 CM 1.955 CM 1.748 CM     Lab Results  Component Value Date   TIBC 227 (L) 02/03/2023   TIBC 301 09/14/2022   TIBC 267 12/31/2021   FERRITIN 146 02/03/2023   FERRITIN 59 09/14/2022   FERRITIN 248 12/31/2021   IRONPCTSAT 18 02/03/2023   IRONPCTSAT 13 (L) 09/14/2022   IRONPCTSAT 22 12/31/2021   Lab  Results  Component Value Date   LDH 166 08/03/2020    STUDIES:  No results found.    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 He has never used smokeless tobacco. He reports that he does not currently use alcohol. He reports that he does not use drugs.The patient is accompanied by his daughter today.  Allergies: No Known Allergies  Current Medications: Current Outpatient Medications  Medication Sig Dispense Refill   acetaminophen (TYLENOL) 500 MG tablet Take 1,000 mg by mouth every 6 (six) hours as needed for mild pain, moderate pain or fever.     amiodarone (PACERONE) 200 MG tablet Take 200 mg by mouth daily.     apixaban (ELIQUIS) 5 MG TABS tablet Take 1 tablet by mouth 2 (two) times daily.     Ascorbic Acid (VITAMIN C) 500 MG CAPS Take 500 mg by mouth daily.     benazepril (LOTENSIN) 20 MG tablet Take 20 mg by mouth daily.     bismuth subsalicylate (PEPTO BISMOL) 262 MG/15ML suspension Take 30 mLs by mouth every 6 (six) hours as needed. For constipation  cefdinir (OMNICEF) 300 MG capsule Take 300 mg by mouth 2 (two) times daily.     cholecalciferol (VITAMIN D3) 25 MCG (1000 UNIT) tablet Take 1,000 Units by mouth daily.     cholestyramine (QUESTRAN) 4 g packet Take 1 packet by mouth 3 (three) times daily.     diphenoxylate-atropine (LOMOTIL)  2.5-0.025 MG tablet Take by mouth.     furosemide (LASIX) 40 MG tablet Take 0.5 tablets (20 mg total) by mouth daily. 30 tablet 0   levothyroxine (SYNTHROID) 75 MCG tablet Take 75 mcg by mouth daily before breakfast.     midodrine (PROAMATINE) 5 MG tablet TAKE 1 TABLET (5 MG TOTAL) BY MOUTH 3 (THREE) TIMES DAILY WITH MEALS. 270 tablet 1   ondansetron (ZOFRAN) 4 MG tablet Take 1 tablet (4 mg total) by mouth every 6 (six) hours as needed for nausea. 20 tablet 0   pantoprazole (PROTONIX) 40 MG tablet TAKE 1 TABLET BY MOUTH EVERY DAY 90 tablet 1   rOPINIRole (REQUIP) 0.5 MG tablet Take 0.5-1 mg by mouth See admin instructions. Taking one tablet at bedtime, if no relief take an additional tablet     rosuvastatin (CRESTOR) 20 MG tablet Take 10 mg by mouth daily.     ketoconazole (NIZORAL) 2 % shampoo Apply topically 3 (three) times a week. (Patient not taking: Reported on 10/05/2022)     metFORMIN (GLUCOPHAGE-XR) 500 MG 24 hr tablet Take 500 mg by mouth 2 (two) times daily. (Patient not taking: Reported on 01/18/2023)     mupirocin ointment (BACTROBAN) 2 % Apply topically daily. (Patient not taking: Reported on 01/18/2023)     Omega-3 Fatty Acids (FISH OIL) 1000 MG CAPS Take 1,000 mg by mouth daily. (Patient not taking: Reported on 01/18/2023)     polyethylene glycol (MIRALAX / GLYCOLAX) 17 g packet Take 17 g by mouth daily as needed for mild constipation. (Patient not taking: Reported on 01/18/2023) 14 each 0   vitamin B-12 (CYANOCOBALAMIN) 500 MCG tablet Take 500 mcg by mouth daily. (Patient not taking: Reported on 01/18/2023)     No current facility-administered medications for this visit.    I,Jasmine M Lassiter,acting as a scribe for Dellia Beckwith, MD.,have documented all relevant documentation on the behalf of Dellia Beckwith, MD,as directed by  Dellia Beckwith, MD while in the presence of Dellia Beckwith, MD.

## 2023-02-04 ENCOUNTER — Other Ambulatory Visit: Payer: Self-pay | Admitting: Pharmacist

## 2023-02-04 ENCOUNTER — Encounter: Payer: Self-pay | Admitting: Oncology

## 2023-02-04 DIAGNOSIS — C44310 Basal cell carcinoma of skin of unspecified parts of face: Secondary | ICD-10-CM

## 2023-02-04 DIAGNOSIS — C4491 Basal cell carcinoma of skin, unspecified: Secondary | ICD-10-CM

## 2023-02-04 LAB — HAPTOGLOBIN: Haptoglobin: 147 mg/dL (ref 38–329)

## 2023-02-04 LAB — T4: T4, Total: 7.4 ug/dL (ref 4.5–12.0)

## 2023-02-06 ENCOUNTER — Encounter: Payer: Self-pay | Admitting: Oncology

## 2023-02-06 ENCOUNTER — Telehealth: Payer: Self-pay | Admitting: Oncology

## 2023-02-06 NOTE — Telephone Encounter (Signed)
02/06/23 Spoke with daughter and confirmed next appts.

## 2023-02-07 ENCOUNTER — Other Ambulatory Visit: Payer: Self-pay

## 2023-02-08 ENCOUNTER — Encounter: Payer: Self-pay | Admitting: Oncology

## 2023-02-09 ENCOUNTER — Encounter: Payer: Self-pay | Admitting: Oncology

## 2023-02-09 ENCOUNTER — Other Ambulatory Visit: Payer: Self-pay

## 2023-02-10 ENCOUNTER — Inpatient Hospital Stay: Payer: Medicare Other

## 2023-02-10 VITALS — BP 113/56 | HR 71 | Temp 98.1°F | Resp 16 | Ht 70.0 in | Wt 150.0 lb

## 2023-02-10 DIAGNOSIS — C44310 Basal cell carcinoma of skin of unspecified parts of face: Secondary | ICD-10-CM

## 2023-02-10 DIAGNOSIS — C4491 Basal cell carcinoma of skin, unspecified: Secondary | ICD-10-CM

## 2023-02-10 DIAGNOSIS — Z5112 Encounter for antineoplastic immunotherapy: Secondary | ICD-10-CM | POA: Diagnosis not present

## 2023-02-10 MED ORDER — SODIUM CHLORIDE 0.9 % IV SOLN: Freq: Once | INTRAVENOUS | Status: AC

## 2023-02-10 MED ORDER — HEPARIN SOD (PORK) LOCK FLUSH 100 UNIT/ML IV SOLN
500.0000 [IU] | Freq: Once | INTRAVENOUS | Status: AC | PRN
  Administered 2023-02-10: 500 [IU]

## 2023-02-10 MED ORDER — INFLUENZA VAC A&B SURF ANT ADJ 0.5 ML IM SUSY
0.5000 mL | PREFILLED_SYRINGE | Freq: Once | INTRAMUSCULAR | Status: AC
  Administered 2023-02-10: 0.5 mL via INTRAMUSCULAR
  Filled 2023-02-10: qty 0.5

## 2023-02-10 MED ORDER — SODIUM CHLORIDE 0.9 % IV SOLN
350.0000 mg | Freq: Once | INTRAVENOUS | Status: AC
  Administered 2023-02-10: 350 mg via INTRAVENOUS
  Filled 2023-02-10: qty 7

## 2023-02-10 MED ORDER — SODIUM CHLORIDE 0.9% FLUSH
10.0000 mL | INTRAVENOUS | Status: DC | PRN
  Administered 2023-02-10: 10 mL

## 2023-02-10 NOTE — Patient Instructions (Signed)
Cemiplimab Injection What is this medication? CEMIPLIMAB (se MIP li mab) treats skin cancer and lung cancer. It works by helping your immune system slow or stop the spread of cancer cells. It is a monoclonal antibody. This medicine may be used for other purposes; ask your health care provider or pharmacist if you have questions. COMMON BRAND NAME(S): LIBTAYO What should I tell my care team before I take this medication? They need to know if you have any of these conditions: Allogeneic stem cell transplant (uses someone else's stem cells) Autoimmune diseases, such as Crohn disease, ulcerative colitis, lupus History of chest radiation Nervous system problems, such as Guillain-Barre syndrome or myasthenia gravis Organ transplant An unusual or allergic reaction to cemiplimab, other medications, foods, dyes, or preservatives Pregnant or trying to get pregnant Breastfeeding How should I use this medication? This medication is infused into a vein. It is given by your care team in a hospital or clinic setting. A special MedGuide will be given to you before each treatment. Be sure to read this information carefully each time. Talk to your care team about the use of this medication in children. Special care may be needed. Overdosage: If you think you have taken too much of this medicine contact a poison control center or emergency room at once. NOTE: This medicine is only for you. Do not share this medicine with others. What if I miss a dose? Keep appointments for follow-up doses. It is important not to miss your dose. Call your care team if you are unable to keep an appointment. What may interact with this medication? Interactions have not been studied. This list may not describe all possible interactions. Give your health care provider a list of all the medicines, herbs, non-prescription drugs, or dietary supplements you use. Also tell them if you smoke, drink alcohol, or use illegal drugs. Some  items may interact with your medicine. What should I watch for while using this medication? Visit your care team for regular checks on your progress. It may be some time before you see the benefit from this medication. You may need blood work done while taking this medication. This medication may cause serious skin reactions. They can happen weeks to months after starting the medication. Contact your care team right away if you notice fevers or flu-like symptoms with a rash. The rash may be red or purple and then turn into blisters or peeling of the skin. You may also notice a red rash with swelling of the face, lips, or lymph nodes in your neck or under your arms. Tell your care team right away if you have any change in your eyesight. Talk to your care team if you may be pregnant. You will need a negative pregnancy test before starting this medication. Contraception is recommended while taking this medication and for 4 months after the last dose. Your care team can help you find the option that works for you. Do not breastfeed while taking this medication and for at least 4 months after the last dose. What side effects may I notice from receiving this medication? Side effects that you should report to your care team as soon as possible: Allergic reactions--skin rash, itching, hives, swelling of the face, lips, tongue, or throat Dry cough, shortness of breath or trouble breathing Eye pain, redness, irritation, or discharge with blurry or decreased vision Heart muscle inflammation--unusual weakness or fatigue, shortness of breath, chest pain, fast or irregular heartbeat, dizziness, swelling of the ankles, feet, or hands  Hormone gland problems--headache, sensitivity to light, unusual weakness or fatigue, dizziness, fast or irregular heartbeat, increased sensitivity to cold or heat, excessive sweating, constipation, hair loss, increased thirst or amount of urine, tremors or shaking, irritability Infusion  reactions--chest pain, shortness of breath or trouble breathing, feeling faint or lightheaded Kidney injury (glomerulonephritis)--decrease in the amount of urine, red or dark brown urine, foamy or bubbly urine, swelling of the ankles, hands, or feet Liver injury--right upper belly pain, loss of appetite, nausea, light-colored stool, dark yellow or brown urine, yellowing skin or eyes, unusual weakness or fatigue Pain, tingling, or numbness in the hands or feet, muscle weakness, change in vision, confusion or trouble speaking, loss of balance or coordination, trouble walking, seizures Rash, fever, and swollen lymph nodes Redness, blistering, peeling, or loosening of the skin, including inside the mouth Sudden or severe stomach pain, bloody diarrhea, fever, nausea, vomiting Side effects that usually do not require medical attention (report these to your care team if they continue or are bothersome): Bone, joint, or muscle pain Diarrhea Fatigue Loss of appetite Nausea Skin rash This list may not describe all possible side effects. Call your doctor for medical advice about side effects. You may report side effects to FDA at 1-800-FDA-1088. Where should I keep my medication? This medication is given in a hospital or clinic and will not be stored at home. NOTE: This sheet is a summary. It may not cover all possible information. If you have questions about this medicine, talk to your doctor, pharmacist, or health care provider.  2024 [employer] (2022-06-06 00:00:00)

## 2023-02-13 ENCOUNTER — Encounter: Payer: Self-pay | Admitting: Oncology

## 2023-02-20 ENCOUNTER — Encounter: Payer: Self-pay | Admitting: Oncology

## 2023-02-20 ENCOUNTER — Encounter: Payer: Self-pay | Admitting: Hematology and Oncology

## 2023-02-22 ENCOUNTER — Encounter: Payer: Self-pay | Admitting: Oncology

## 2023-03-05 ENCOUNTER — Other Ambulatory Visit: Payer: Self-pay

## 2023-03-07 ENCOUNTER — Encounter: Payer: Self-pay | Admitting: Oncology

## 2023-03-07 NOTE — Progress Notes (Deleted)
[facility]  [address] [place],  [place]  [address] [phone]  Clinic Day:  03/07/2023  Referring physician: [name], MD  ASSESSMENT & PLAN:   Assessment & Plan: No problem-specific Assessment & Plan notes found for this encounter.    The patient understands the plans discussed today and is in agreement with them.  He knows to contact our office if he develops concerns prior to his next appointment.   I provided *** minutes of face-to-face time during this encounter and > 50% was spent counseling as documented under my assessment and plan.    Adah Perl, PA-C  [facility]  C[facility]ER CENTER - A DEPT OF [facility] [address] [place] [address] Dept: [phone] Dept Fax: [phone]   No orders of the defined types were placed in this encounter.     CHIEF COMPLAINT:  CC: ***  Current Treatment:  ***  HISTORY OF PRESENT ILLNESS:   Oncology History  CLL (chronic lymphocytic leukemia) (HCC)  05/25/2015 Initial Diagnosis   CLL (chronic lymphocytic leukemia) (HCC)   02/04/2021 Cancer Staging   Staging form: Chronic Lymphocytic Leukemia / Small Lymphocytic Lymphoma, AJCC 8th Edition - Clinical stage from 02/04/2021: Modified Rai Stage III (Modified Rai risk: High, Binet: Stage A, Lymphocytosis: Present, Adenopathy: Absent, Organomegaly: Absent, Anemia: Present, Thrombocytopenia: Absent) - Signed by Dellia Beckwith, MD on 02/08/2021 Stage prefix: Recurrence Absolute lymphocyte count (ALC) (cells/uL): 100 Hemoglobin (Hgb) (g/dL): 52.8 Platelet count (x10E9/L of blood): 184   Cancer of the skin, basal cell  05/06/2021 Initial Diagnosis   Cancer of the skin, basal cell   08/30/2021 - 12/31/2021 Chemotherapy   Patient is on Treatment Plan : SQUAMOUS SKIN Cemiplimab q21d     08/30/2021 - 01/21/2022 Chemotherapy   Patient is on Treatment Plan : ADVANCED CUTANEOUS SQUAMOUS CELL CARCINOMA  Cemiplimab q21d     07/15/2022 -  Chemotherapy   Patient is on Treatment Plan : ADVANCED CUTANEOUS SQUAMOUS CELL CARCINOMA Cemiplimab q21d     BCC (basal cell carcinoma), face  12/28/2016 Initial Diagnosis   BCC (basal cell carcinoma), face   07/15/2022 -  Chemotherapy   Patient is on Treatment Plan : ADVANCED CUTANEOUS SQUAMOUS CELL CARCINOMA Cemiplimab q21d         INTERVAL HISTORY:  Fernando Young is here today for repeat clinical assessment. He denies fevers or chills. He denies pain. His appetite is good. His weight {Weight change:10426}.  REVIEW OF SYSTEMS:  Review of Systems - Oncology   VITALS:  There were no vitals taken for this visit.  Wt Readings from Last 3 Encounters:  02/10/23 150 lb (68 kg)  01/20/23 150 lb (68 kg)  01/18/23 149 lb (67.6 kg)    There is no height or weight on file to calculate BMI.  Performance status (ECOG): {CHL ONC [id number]  PHYSICAL EXAM:  Physical Exam  LABS:      Latest Ref Rng & Units 02/03/2023    3:12 PM 01/18/2023   12:00 AM 12/28/2022   12:00 AM  CBC  WBC  124.0     111.7     96.6      Hemoglobin 13.5 - 17.5 8.9     9.4     9.7      Hematocrit 41 - 53 30     31     32      Platelets 150 - 400 K/uL 237     219  228         This result is from an external source.      Latest Ref Rng & Units 02/03/2023    3:12 PM 01/18/2023   12:00 AM 12/28/2022   12:00 AM  CMP  BUN 4 - 21 14     23     23       Creatinine 0.6 - 1.3 0.8     0.9     0.8      Sodium 137 - 147 134     138     138      Potassium 3.5 - 5.1 mEq/L 4.0     3.7     4.3      Chloride 99 - 108 103     110     107      CO2 13 - 22 27     24     25       Calcium 8.7 - 10.7 9.3     8.2     8.3      Alkaline Phos 25 - 125 201     59     59      AST 14 - 40 48     22     24      ALT 10 - 40 U/L 144     15     15         This result is from an external source.     No results found for: "CEA1", "CEA" / No results found for: "CEA1", "CEA" No results found for:  "PSA1" No results found for: "YQM578" No results found for: "CAN125"  No results found for: "TOTALPROTELP", "ALBUMINELP", "A1GS", "A2GS", "BETS", "BETA2SER", "GAMS", "MSPIKE", "SPEI" Lab Results  Component Value Date   TIBC 227 (L) 02/03/2023   TIBC 301 09/14/2022   TIBC 267 12/31/2021   FERRITIN 146 02/03/2023   FERRITIN 59 09/14/2022   FERRITIN 248 12/31/2021   IRONPCTSAT 18 02/03/2023   IRONPCTSAT 13 (L) 09/14/2022   IRONPCTSAT 22 12/31/2021   Lab Results  Component Value Date   LDH 166 08/03/2020    STUDIES:  No results found.    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 He has never used smokeless tobacco. He reports that he does not currently use alcohol. He reports that he does not use drugs.The patient is {Blank single:19197::"alone","accompanied by"} *** today.  Allergies: No Known Allergies  Current Medications: Current Outpatient Medications  Medication Sig Dispense Refill   acetaminophen (TYLENOL) 500 MG tablet Take 1,000 mg by mouth every 6 (six) hours as needed for mild pain, moderate pain or fever.     amiodarone (PACERONE) 200 MG tablet Take 200  mg by mouth daily.     apixaban (ELIQUIS) 5 MG TABS tablet Take 1 tablet by mouth 2 (two) times daily.     Ascorbic Acid (VITAMIN C) 500 MG CAPS Take 500 mg by mouth daily.      benazepril (LOTENSIN) 20 MG tablet Take 20 mg by mouth daily.     bismuth subsalicylate (PEPTO BISMOL) 262 MG/15ML suspension Take 30 mLs by mouth every 6 (six) hours as needed. For constipation     cefdinir (OMNICEF) 300 MG capsule Take 300 mg by mouth 2 (two) times daily.     cholecalciferol (VITAMIN D3) 25 MCG (1000 UNIT) tablet Take 1,000 Units by mouth daily.     cholestyramine (QUESTRAN) 4 g packet Take 1 packet by mouth 3 (three) times daily.     diphenoxylate-atropine (LOMOTIL) 2.5-0.025 MG tablet Take by mouth.     furosemide (LASIX) 40 MG tablet Take 0.5 tablets (20 mg total) by mouth daily. 30 tablet 0   levothyroxine (SYNTHROID) 75 MCG tablet Take 75 mcg by mouth daily before breakfast.     metFORMIN (GLUCOPHAGE-XR) 500 MG 24 hr tablet Take 500 mg by mouth 2 (two) times daily. (Patient not taking: Reported on 01/18/2023)     midodrine (PROAMATINE) 5 MG tablet TAKE 1 TABLET (5 MG TOTAL) BY MOUTH 3 (THREE) TIMES DAILY WITH MEALS. 270 tablet 1   ondansetron (ZOFRAN) 4 MG tablet Take 1 tablet (4 mg total) by mouth every 6 (six) hours as needed for nausea. 20 tablet 0   pantoprazole (PROTONIX) 40 MG tablet TAKE 1 TABLET BY MOUTH EVERY DAY 90 tablet 1   rOPINIRole (REQUIP) 0.5 MG tablet Take 0.5-1 mg by mouth See admin instructions. Taking one tablet at bedtime, if no relief take an additional tablet     rosuvastatin (CRESTOR) 20 MG tablet Take 10 mg by mouth daily.     No current facility-administered medications for this visit.

## 2023-03-08 ENCOUNTER — Ambulatory Visit: Payer: Medicare Other

## 2023-03-08 ENCOUNTER — Ambulatory Visit: Payer: Medicare Other | Admitting: Oncology

## 2023-03-08 ENCOUNTER — Ambulatory Visit: Payer: Medicare Other | Admitting: Hematology and Oncology

## 2023-03-08 ENCOUNTER — Other Ambulatory Visit: Payer: Medicare Other

## 2023-03-09 ENCOUNTER — Other Ambulatory Visit: Payer: Self-pay

## 2023-03-14 ENCOUNTER — Other Ambulatory Visit: Payer: Self-pay

## 2023-03-15 ENCOUNTER — Encounter: Payer: Self-pay | Admitting: Oncology

## 2023-03-16 ENCOUNTER — Other Ambulatory Visit: Payer: Self-pay

## 2023-04-02 ENCOUNTER — Inpatient Hospital Stay (HOSPITAL_COMMUNITY): Payer: Medicare Other

## 2023-04-02 ENCOUNTER — Encounter (HOSPITAL_COMMUNITY): Payer: Self-pay

## 2023-04-02 ENCOUNTER — Inpatient Hospital Stay (HOSPITAL_COMMUNITY)
Admit: 2023-04-02 | Discharge: 2023-04-08 | DRG: 871 | Disposition: A | Discharge status: Home / Self Care | Payer: Medicare Other | Source: Ambulatory Visit | Attending: Internal Medicine | Admitting: Internal Medicine

## 2023-04-02 DIAGNOSIS — I452 Bifascicular block: Secondary | ICD-10-CM | POA: Diagnosis present

## 2023-04-02 DIAGNOSIS — D509 Iron deficiency anemia, unspecified: Secondary | ICD-10-CM | POA: Diagnosis present

## 2023-04-02 DIAGNOSIS — I130 Hypertensive heart and chronic kidney disease with heart failure and stage 1 through stage 4 chronic kidney disease, or unspecified chronic kidney disease: Secondary | ICD-10-CM | POA: Diagnosis present

## 2023-04-02 DIAGNOSIS — Z7189 Other specified counseling: Secondary | ICD-10-CM | POA: Diagnosis not present

## 2023-04-02 DIAGNOSIS — I480 Paroxysmal atrial fibrillation: Secondary | ICD-10-CM | POA: Diagnosis present

## 2023-04-02 DIAGNOSIS — E1122 Type 2 diabetes mellitus with diabetic chronic kidney disease: Secondary | ICD-10-CM | POA: Diagnosis present

## 2023-04-02 DIAGNOSIS — Z681 Body mass index (BMI) 19 or less, adult: Secondary | ICD-10-CM

## 2023-04-02 DIAGNOSIS — I21A1 Myocardial infarction type 2: Secondary | ICD-10-CM | POA: Diagnosis present

## 2023-04-02 DIAGNOSIS — R4589 Other symptoms and signs involving emotional state: Secondary | ICD-10-CM | POA: Diagnosis not present

## 2023-04-02 DIAGNOSIS — R6521 Severe sepsis with septic shock: Secondary | ICD-10-CM | POA: Diagnosis present

## 2023-04-02 DIAGNOSIS — I2510 Atherosclerotic heart disease of native coronary artery without angina pectoris: Secondary | ICD-10-CM | POA: Diagnosis present

## 2023-04-02 DIAGNOSIS — E785 Hyperlipidemia, unspecified: Secondary | ICD-10-CM

## 2023-04-02 DIAGNOSIS — Z79899 Other long term (current) drug therapy: Secondary | ICD-10-CM

## 2023-04-02 DIAGNOSIS — C4490 Unspecified malignant neoplasm of skin, unspecified: Secondary | ICD-10-CM

## 2023-04-02 DIAGNOSIS — I4892 Unspecified atrial flutter: Secondary | ICD-10-CM | POA: Diagnosis present

## 2023-04-02 DIAGNOSIS — I10 Essential (primary) hypertension: Secondary | ICD-10-CM | POA: Diagnosis present

## 2023-04-02 DIAGNOSIS — Z87891 Personal history of nicotine dependence: Secondary | ICD-10-CM

## 2023-04-02 DIAGNOSIS — E119 Type 2 diabetes mellitus without complications: Secondary | ICD-10-CM | POA: Diagnosis not present

## 2023-04-02 DIAGNOSIS — D638 Anemia in other chronic diseases classified elsewhere: Secondary | ICD-10-CM | POA: Diagnosis present

## 2023-04-02 DIAGNOSIS — E039 Hypothyroidism, unspecified: Secondary | ICD-10-CM | POA: Diagnosis present

## 2023-04-02 DIAGNOSIS — I252 Old myocardial infarction: Secondary | ICD-10-CM

## 2023-04-02 DIAGNOSIS — Z515 Encounter for palliative care: Secondary | ICD-10-CM

## 2023-04-02 DIAGNOSIS — J9811 Atelectasis: Secondary | ICD-10-CM | POA: Diagnosis present

## 2023-04-02 DIAGNOSIS — I429 Cardiomyopathy, unspecified: Secondary | ICD-10-CM | POA: Diagnosis not present

## 2023-04-02 DIAGNOSIS — R079 Chest pain, unspecified: Secondary | ICD-10-CM | POA: Diagnosis not present

## 2023-04-02 DIAGNOSIS — J189 Pneumonia, unspecified organism: Secondary | ICD-10-CM | POA: Diagnosis not present

## 2023-04-02 DIAGNOSIS — I428 Other cardiomyopathies: Secondary | ICD-10-CM | POA: Diagnosis present

## 2023-04-02 DIAGNOSIS — Z7901 Long term (current) use of anticoagulants: Secondary | ICD-10-CM | POA: Diagnosis not present

## 2023-04-02 DIAGNOSIS — K219 Gastro-esophageal reflux disease without esophagitis: Secondary | ICD-10-CM | POA: Diagnosis present

## 2023-04-02 DIAGNOSIS — R1310 Dysphagia, unspecified: Secondary | ICD-10-CM | POA: Diagnosis not present

## 2023-04-02 DIAGNOSIS — I214 Non-ST elevation (NSTEMI) myocardial infarction: Secondary | ICD-10-CM

## 2023-04-02 DIAGNOSIS — Z79890 Hormone replacement therapy (postmenopausal): Secondary | ICD-10-CM

## 2023-04-02 DIAGNOSIS — C180 Malignant neoplasm of cecum: Secondary | ICD-10-CM | POA: Diagnosis present

## 2023-04-02 DIAGNOSIS — A419 Sepsis, unspecified organism: Principal | ICD-10-CM | POA: Diagnosis present

## 2023-04-02 DIAGNOSIS — I482 Chronic atrial fibrillation, unspecified: Secondary | ICD-10-CM | POA: Diagnosis present

## 2023-04-02 DIAGNOSIS — I4891 Unspecified atrial fibrillation: Secondary | ICD-10-CM | POA: Diagnosis not present

## 2023-04-02 DIAGNOSIS — C9110 Chronic lymphocytic leukemia of B-cell type not having achieved remission: Secondary | ICD-10-CM | POA: Diagnosis present

## 2023-04-02 DIAGNOSIS — I5022 Chronic systolic (congestive) heart failure: Secondary | ICD-10-CM

## 2023-04-02 DIAGNOSIS — E43 Unspecified severe protein-calorie malnutrition: Secondary | ICD-10-CM | POA: Diagnosis present

## 2023-04-02 DIAGNOSIS — Z66 Do not resuscitate: Secondary | ICD-10-CM | POA: Diagnosis present

## 2023-04-02 DIAGNOSIS — G2581 Restless legs syndrome: Secondary | ICD-10-CM | POA: Diagnosis present

## 2023-04-02 DIAGNOSIS — C4491 Basal cell carcinoma of skin, unspecified: Secondary | ICD-10-CM | POA: Diagnosis present

## 2023-04-02 DIAGNOSIS — R64 Cachexia: Secondary | ICD-10-CM | POA: Diagnosis present

## 2023-04-02 DIAGNOSIS — Z8041 Family history of malignant neoplasm of ovary: Secondary | ICD-10-CM

## 2023-04-02 DIAGNOSIS — Z7982 Long term (current) use of aspirin: Secondary | ICD-10-CM

## 2023-04-02 DIAGNOSIS — Z8546 Personal history of malignant neoplasm of prostate: Secondary | ICD-10-CM

## 2023-04-02 DIAGNOSIS — Z7984 Long term (current) use of oral hypoglycemic drugs: Secondary | ICD-10-CM

## 2023-04-02 DIAGNOSIS — R7989 Other specified abnormal findings of blood chemistry: Secondary | ICD-10-CM | POA: Diagnosis not present

## 2023-04-02 LAB — CBC WITH DIFFERENTIAL/PLATELET
Abs Immature Granulocytes: 0 10*3/uL (ref 0.00–0.07)
Band Neutrophils: 2 %
Basophils Absolute: 0 10*3/uL (ref 0.0–0.1)
Basophils Relative: 0 %
Eosinophils Absolute: 0 10*3/uL (ref 0.0–0.5)
Eosinophils Relative: 0 %
HCT: 31.9 % — ABNORMAL LOW (ref 39.0–52.0)
Hemoglobin: 9.1 g/dL — ABNORMAL LOW (ref 13.0–17.0)
Lymphocytes Relative: 71 %
Lymphs Abs: 125.2 10*3/uL — ABNORMAL HIGH (ref 0.7–4.0)
MCH: 28.7 pg (ref 26.0–34.0)
MCHC: 28.5 g/dL — ABNORMAL LOW (ref 30.0–36.0)
MCV: 100.6 fL — ABNORMAL HIGH (ref 80.0–100.0)
Monocytes Absolute: 5.3 10*3/uL — ABNORMAL HIGH (ref 0.1–1.0)
Monocytes Relative: 3 %
Neutro Abs: 38.8 10*3/uL — ABNORMAL HIGH (ref 1.7–7.7)
Neutrophils Relative %: 20 %
Other: 4 %
Platelets: 300 10*3/uL (ref 150–400)
RBC: 3.17 MIL/uL — ABNORMAL LOW (ref 4.22–5.81)
RDW: 16.9 % — ABNORMAL HIGH (ref 11.5–15.5)
WBC Morphology: ABNORMAL
WBC: 176.4 10*3/uL (ref 4.0–10.5)
nRBC: 0 % (ref 0.0–0.2)

## 2023-04-02 LAB — PHOSPHORUS: Phosphorus: 3.9 mg/dL (ref 2.5–4.6)

## 2023-04-02 LAB — COMPREHENSIVE METABOLIC PANEL
ALT: 19 U/L (ref 0–44)
AST: 51 U/L — ABNORMAL HIGH (ref 15–41)
Albumin: 2.8 g/dL — ABNORMAL LOW (ref 3.5–5.0)
Alkaline Phosphatase: 64 U/L (ref 38–126)
Anion gap: 9 (ref 5–15)
BUN: 32 mg/dL — ABNORMAL HIGH (ref 8–23)
CO2: 20 mmol/L — ABNORMAL LOW (ref 22–32)
Calcium: 8 mg/dL — ABNORMAL LOW (ref 8.9–10.3)
Chloride: 109 mmol/L (ref 98–111)
Creatinine, Ser: 1.03 mg/dL (ref 0.61–1.24)
GFR, Estimated: >60 mL/min (ref >60)
Glucose, Bld: 151 mg/dL — ABNORMAL HIGH (ref 70–99)
Potassium: 4.5 mmol/L (ref 3.5–5.1)
Sodium: 138 mmol/L (ref 135–145)
Total Bilirubin: 0.5 mg/dL (ref <1.2)
Total Protein: 5.4 g/dL — ABNORMAL LOW (ref 6.5–8.1)

## 2023-04-02 LAB — LIPID PANEL
Cholesterol: 71 mg/dL (ref 0–200)
HDL: 31 mg/dL — ABNORMAL LOW (ref >40)
LDL Cholesterol: 29 mg/dL (ref 0–99)
Total CHOL/HDL Ratio: 2.3 {ratio}
Triglycerides: 54 mg/dL (ref <150)
VLDL: 11 mg/dL (ref 0–40)

## 2023-04-02 LAB — LIPASE, BLOOD: Lipase: 19 U/L (ref 11–51)

## 2023-04-02 LAB — TROPONIN I (HIGH SENSITIVITY): Troponin I (High Sensitivity): 7306 ng/L (ref <18)

## 2023-04-02 LAB — LACTIC ACID, PLASMA: Lactic Acid, Venous: 1 mmol/L (ref 0.5–1.9)

## 2023-04-02 LAB — GLUCOSE, CAPILLARY
Glucose-Capillary: 118 mg/dL — ABNORMAL HIGH (ref 70–99)
Glucose-Capillary: 121 mg/dL — ABNORMAL HIGH (ref 70–99)

## 2023-04-02 MED ORDER — SODIUM CHLORIDE 0.9 % IV SOLN
500.0000 mg | INTRAVENOUS | Status: DC
  Administered 2023-04-02 – 2023-04-03 (×2): 500 mg via INTRAVENOUS
  Filled 2023-04-02 (×2): qty 5

## 2023-04-02 MED ORDER — ATORVASTATIN CALCIUM 40 MG PO TABS
80.0000 mg | ORAL_TABLET | Freq: Every day | ORAL | Status: DC
  Administered 2023-04-02 – 2023-04-08 (×7): 80 mg via ORAL
  Filled 2023-04-02 (×8): qty 2

## 2023-04-02 MED ORDER — ROPINIROLE HCL 1 MG PO TABS
0.5000 mg | ORAL_TABLET | Freq: Every day | ORAL | Status: DC
  Administered 2023-04-02 – 2023-04-07 (×6): 1 mg via ORAL
  Filled 2023-04-02 (×6): qty 1

## 2023-04-02 MED ORDER — VANCOMYCIN HCL IN DEXTROSE 1-5 GM/200ML-% IV SOLN
1000.0000 mg | Freq: Once | INTRAVENOUS | Status: AC
  Administered 2023-04-03: 1000 mg via INTRAVENOUS
  Filled 2023-04-02: qty 200

## 2023-04-02 MED ORDER — VANCOMYCIN HCL IN DEXTROSE 1-5 GM/200ML-% IV SOLN: 1000.0000 mg | Freq: Two times a day (BID) | INTRAVENOUS | Status: DC

## 2023-04-02 MED ORDER — ENOXAPARIN SODIUM 80 MG/0.8ML IJ SOSY
1.0000 mg/kg | PREFILLED_SYRINGE | Freq: Two times a day (BID) | INTRAMUSCULAR | Status: DC
  Administered 2023-04-03: 67.5 mg via SUBCUTANEOUS
  Filled 2023-04-02: qty 0.8

## 2023-04-02 MED ORDER — ONDANSETRON HCL 4 MG/2ML IJ SOLN: 4.0000 mg | Freq: Four times a day (QID) | INTRAMUSCULAR | Status: DC | PRN

## 2023-04-02 MED ORDER — FAMOTIDINE 20 MG PO TABS
20.0000 mg | ORAL_TABLET | Freq: Every day | ORAL | Status: DC
  Administered 2023-04-02 – 2023-04-03 (×2): 20 mg via ORAL
  Filled 2023-04-02 (×2): qty 1

## 2023-04-02 MED ORDER — SODIUM CHLORIDE 0.9% FLUSH: 10.0000 mL | INTRAVENOUS | Status: DC | PRN

## 2023-04-02 MED ORDER — ENOXAPARIN SODIUM 30 MG/0.3ML IJ SOSY
30.0000 mg | PREFILLED_SYRINGE | Freq: Once | INTRAMUSCULAR | Status: AC
  Administered 2023-04-02: 30 mg via SUBCUTANEOUS
  Filled 2023-04-02: qty 0.3

## 2023-04-02 MED ORDER — MAGNESIUM SULFATE 2 GM/50ML IV SOLN
2.0000 g | Freq: Once | INTRAVENOUS | Status: AC
  Administered 2023-04-02: 2 g via INTRAVENOUS
  Filled 2023-04-02: qty 50

## 2023-04-02 MED ORDER — NOREPINEPHRINE 4 MG/250ML-% IV SOLN
0.0000 ug/min | INTRAVENOUS | Status: DC
  Administered 2023-04-02 – 2023-04-03 (×2): 2 ug/min via INTRAVENOUS
  Filled 2023-04-02 (×2): qty 250

## 2023-04-02 MED ORDER — SODIUM CHLORIDE 0.9 % IV SOLN
1.0000 g | INTRAVENOUS | Status: DC
  Administered 2023-04-03 – 2023-04-04 (×2): 1 g via INTRAVENOUS
  Filled 2023-04-02 (×3): qty 10

## 2023-04-02 MED ORDER — CHLORHEXIDINE GLUCONATE CLOTH 2 % EX PADS
6.0000 | MEDICATED_PAD | Freq: Every day | CUTANEOUS | Status: DC
Start: 2023-04-03 — End: 2023-04-08
  Administered 2023-04-03 – 2023-04-08 (×6): 6 via TOPICAL

## 2023-04-02 MED ORDER — LEVOTHYROXINE SODIUM 75 MCG PO TABS
75.0000 ug | ORAL_TABLET | Freq: Every day | ORAL | Status: DC
  Administered 2023-04-03 – 2023-04-08 (×6): 75 ug via ORAL
  Filled 2023-04-02 (×6): qty 1

## 2023-04-02 MED ORDER — ACETAMINOPHEN 500 MG PO TABS
1000.0000 mg | ORAL_TABLET | Freq: Four times a day (QID) | ORAL | Status: DC | PRN
  Administered 2023-04-08: 1000 mg via ORAL
  Filled 2023-04-02: qty 2

## 2023-04-02 MED ORDER — LACTATED RINGERS IV SOLN: INTRAVENOUS | Status: AC

## 2023-04-02 MED ORDER — ASPIRIN 325 MG PO TBEC
325.0000 mg | DELAYED_RELEASE_TABLET | Freq: Every day | ORAL | Status: DC
  Administered 2023-04-03: 325 mg via ORAL
  Filled 2023-04-02: qty 1

## 2023-04-02 MED ORDER — ZOLPIDEM TARTRATE 5 MG PO TABS: 5.0000 mg | ORAL_TABLET | Freq: Every evening | ORAL | Status: DC | PRN

## 2023-04-02 MED ORDER — ENOXAPARIN SODIUM 40 MG/0.4ML IJ SOSY
40.0000 mg | PREFILLED_SYRINGE | INTRAMUSCULAR | Status: DC
  Administered 2023-04-02: 40 mg via SUBCUTANEOUS
  Filled 2023-04-02: qty 0.4

## 2023-04-02 MED ORDER — CEFTRIAXONE SODIUM 1 G IJ SOLR: 1.0000 g | INTRAMUSCULAR | Status: DC

## 2023-04-02 MED ORDER — BISMUTH SUBSALICYLATE 262 MG/15ML PO SUSP: 30.0000 mL | Freq: Four times a day (QID) | ORAL | Status: DC | PRN

## 2023-04-02 MED ORDER — VANCOMYCIN HCL 1750 MG/350ML IV SOLN: 1750.0000 mg | INTRAVENOUS | Status: DC

## 2023-04-02 MED ORDER — AMIODARONE HCL 200 MG PO TABS
200.0000 mg | ORAL_TABLET | Freq: Every day | ORAL | Status: DC
  Administered 2023-04-02 – 2023-04-08 (×7): 200 mg via ORAL
  Filled 2023-04-02 (×7): qty 1

## 2023-04-02 MED ORDER — IPRATROPIUM-ALBUTEROL 0.5-2.5 (3) MG/3ML IN SOLN
3.0000 mL | Freq: Four times a day (QID) | RESPIRATORY_TRACT | Status: DC
  Administered 2023-04-02: 3 mL via RESPIRATORY_TRACT
  Filled 2023-04-02: qty 3

## 2023-04-02 MED ORDER — MIDODRINE HCL 5 MG PO TABS
5.0000 mg | ORAL_TABLET | Freq: Three times a day (TID) | ORAL | Status: DC
  Administered 2023-04-03 – 2023-04-04 (×5): 5 mg via ORAL
  Filled 2023-04-02 (×5): qty 1

## 2023-04-02 MED ORDER — DOCUSATE SODIUM 100 MG PO CAPS: 100.0000 mg | ORAL_CAPSULE | Freq: Two times a day (BID) | ORAL | Status: DC | PRN

## 2023-04-02 MED ORDER — INSULIN ASPART 100 UNIT/ML IJ SOLN
0.0000 [IU] | INTRAMUSCULAR | Status: DC
  Administered 2023-04-02 – 2023-04-03 (×2): 1 [IU] via SUBCUTANEOUS
  Administered 2023-04-04: 2 [IU] via SUBCUTANEOUS

## 2023-04-02 MED ORDER — POLYETHYLENE GLYCOL 3350 17 G PO PACK: 17.0000 g | PACK | Freq: Every day | ORAL | Status: DC | PRN

## 2023-04-02 NOTE — H&P (Addendum)
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 Has mild abd pain, nausea and vomiting x1, and loose BM x1. Fever 38.8 C in ED  with chills, sinus tachycardia (140) and hypotensive (MAP 47 mmHg). Denied SOB, cough, wheezing, URT symptoms, palpitations, dizziness, LL edema, abd distension, melena, blood per rectum, rash, or urinary symptoms.  Quit smoking 1985 (smoked 20 py). No alcohol or illicit drug use. Was admitted 3 weeks ago with bowel obstruction fro 5 days and managed conservatively and discharged to rehab.  He is DNR/DNI and confirmed by family (see transfer notes).   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  LA 2.3, down to 1.2 after IVF bolus UA and resp PCR panel: negative PCXR: RML/RML infiltrates and LLL atelectasis. He has a port cath on the right side.  Repeat PCXR: small pleural effusions with basilar infiltration or atelectasis, similar to prior study. VBG: 7.35/27/48 Trop 0.295 Pro-BNP: 633 WBC 189.3, L 87.6% Hb 10.3 Other labs, PCXR and EKG were reviewed  Interim History / Subjective:  Cardiology was consulted in ED (fellow  Dr. Venetia Maxon was called) for eval. No further recommendations given. EKG: sinus with RBBB and LAFB.   Objective   Temperature 98.1 F (36.7 C), temperature source Oral.       No intake or output data in the 24 hours ending 04/02/23 2104 There were no vitals filed for this visit.  Examination: General: alert, oriented x4, and comfortable. On 2 L [place]. SpO2 98%  HENT: PERLA, normal pharynx and oral mucosa. No LNE or thyromegaly. No JVD Lungs: symmetrical air entry bilaterally. Basal crackles. No wheezing Cardiovascular: NL S1/S2. No m/g/r Abdomen: no distension or tenderness. Left large inguinal hernia and small right inguinal hernia Extremities: trace edema. Symmetrical Neuro: nonfocal Skin: facial Ca and chronic legs patchy redness. 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%), LAFB, RBBB -as above  Afib (on Eliquis), HTN and dyslipidemia -Hold BP meds and NOAC -Statin -FLP  HypoMg -Replace Mg, goal >2  -Goal K >4  DM-2 -Glycemic control -HbA1c  RLS -Resume home meds  Moderate PCM -Dietitian consult   Hypothyroidism -Resume home med -Recent TFT: NL  GERD -Resumed home meds  Fe def anemia and anemia of chronic illness -Monitor CBC -Received Fe IV infusions  in the past -Recent Fe sat is NL  CLL Baseline WBC 96-124 L  Facial BCC on Vismodegib and facial Sq.C.Ca  Best Practice (right click and "Reselect all SmartList Selections" daily)   Diet/type: NPO w/ oral meds DVT prophylaxis: enoxaparin (LOVENOX) injection 40 mg Start: 04/02/23 2145 SCDs Start: 04/02/23 2052    Pressure ulcer(s): not present on admission  GI prophylaxis: H2B Lines: N/A Foley:  N/A Code Status:  DNR Last date of multidisciplinary goals of care discussion []   Labs   CBC: No results for input(s): "WBC", "NEUTROABS", "HGB", "HCT", "MCV", "PLT" in the last 168 hours.  Basic Metabolic Panel: No results for input(s): "NA", "K", "CL", "CO2", "GLUCOSE", "BUN", "CREATININE", "CALCIUM", "MG", "PHOS" in the last 168 hours. GFR: CrCl cannot be calculated (Patient's most recent lab result is older than the maximum 21 days allowed.). No results for input(s): "PROCALCITON", "WBC", "LATICACIDVEN" in the last 168 hours.  Liver Function Tests: No results for input(s): "AST", "ALT", "ALKPHOS", "BILITOT", "PROT", "ALBUMIN" in the last 168 hours. No results for input(s): "LIPASE", "AMYLASE" in the last 168 hours. No results for input(s): "AMMONIA" in the last 168 hours.  ABG No results found for: "PHART", "PCO2ART", "PO2ART", "HCO3", "TCO2", "ACIDBASEDEF", "O2SAT"   Coagulation Profile: No results for input(s): "INR", "PROTIME" in the last 168 hours.  Cardiac Enzymes: No results for input(s): "CKTOTAL", "CKMB", "CKMBINDEX", "TROPONINI" in the last 168 hours.  HbA1C: No results found for: "HGBA1C"  CBG: No results for input(s): "GLUCAP" in the last 168 hours.  Review of Systems:   Review of Systems  Constitutional:  Positive for chills, fever, malaise/fatigue and weight loss. Negative for diaphoresis.  Respiratory:  Negative for cough, hemoptysis, sputum production, shortness of breath and wheezing.   Cardiovascular:  Positive for chest pain. Negative for palpitations, orthopnea, claudication, leg swelling and PND.  Gastrointestinal:  Positive for abdominal pain, nausea and vomiting. Negative for blood in stool, diarrhea, heartburn and melena.  Genitourinary:  Negative for dysuria, flank pain, frequency, hematuria and urgency.  Skin:  Negative for itching and rash.  Neurological:   Negative for dizziness and headaches.     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   Surgical History:   Past Surgical History:  Procedure Laterality Date   PROSTATE BIOPSY       Social History:   reports that he has quit smoking. He has never used smokeless tobacco. He reports that he does not currently use alcohol. He reports that he does not use drugs.   Family History:  His family history includes Cancer (age of onset: 3) in his sister; Ovarian cancer (age of onset: 63) in his mother.   Allergies No Known Allergies   Home Medications  Prior to Admission medications   Medication Sig Start Date End Date Taking? Authorizing Provider  acetaminophen (TYLENOL) 500 MG tablet Take 1,000 mg by mouth every 6 (six) hours as needed for mild pain, moderate pain or fever.    [provider]  amiodarone (PACERONE) 200 MG tablet Take 200 mg by mouth daily. 03/10/20   [provider]  apixaban (ELIQUIS) 5 MG TABS tablet Take 1 tablet by mouth 2 (two) times daily. 02/09/22   [provider]  Ascorbic Acid (VITAMIN C) 500 MG CAPS Take 500 mg by mouth daily.    [provider]  benazepril (LOTENSIN) 20 MG tablet Take 20 mg by mouth daily. 10/10/22   [provider]  bismuth subsalicylate (PEPTO BISMOL) 262 MG/15ML suspension Take 30 mLs by mouth every 6 (six) hours as needed. For constipation 01/05/22   [provider]  cefdinir (OMNICEF) 300 MG capsule Take 300 mg by mouth 2 (two) times daily. 01/30/23   [provider]  cholecalciferol  (VITAMIN D3) 25 MCG (1000 UNIT) tablet Take 1,000 Units by mouth daily.    [provider]  cholestyramine (QUESTRAN) 4 g packet Take 1 packet by mouth 3 (three) times daily. 10/19/22   [provider]  diphenoxylate-atropine (LOMOTIL) 2.5-0.025 MG tablet Take by mouth. 11/28/22   [provider]  furosemide (LASIX) 40 MG tablet Take 0.5 tablets (20 mg total) by mouth daily. 02/22/22   Marguerita Merles Latif, DO  levothyroxine (SYNTHROID) 75 MCG tablet Take 75 mcg by mouth daily before breakfast. 12/06/21   [provider]  metFORMIN (GLUCOPHAGE-XR) 500 MG 24 hr tablet Take 500 mg by mouth 2 (two) times daily. Patient not taking: Reported on 01/18/2023 12/03/20   [provider]  midodrine (PROAMATINE) 5 MG tablet TAKE 1 TABLET (5 MG TOTAL) BY MOUTH 3 (THREE) TIMES DAILY WITH MEALS. 11/01/22   Dellia Beckwith, MD  ondansetron (ZOFRAN) 4 MG tablet Take 1 tablet (4 mg total) by mouth every 6 (six) hours as needed for nausea. 02/22/22   Sheikh, Omair Latif, DO  pantoprazole (PROTONIX) 40 MG tablet TAKE 1 TABLET BY MOUTH EVERY DAY 03/08/22   Mosher, Harvin Hazel A, PA-C  rOPINIRole (REQUIP) 0.5 MG tablet Take 0.5-1 mg by mouth See admin instructions. Taking one tablet at bedtime, if no relief take an additional tablet    [provider]  rosuvastatin (CRESTOR) 20 MG tablet Take 10 mg by mouth daily. 02/04/21   [provider]     Critical care time: 65 min

## 2023-04-02 NOTE — Progress Notes (Signed)
PHARMACY NOTE:  ANTIMICROBIAL RENAL DOSAGE ADJUSTMENT  Current antimicrobial regimen includes a mismatch between antimicrobial dosage and estimated renal function.  As per policy approved by the Pharmacy & Therapeutics and Medical Executive Committees, the antimicrobial dosage will be adjusted accordingly.  Current antimicrobial dosage:  vanc 1gm IV q12h  Indication: sepsis/pna  Renal Function:  Estimated Creatinine Clearance: 51.3 mL/min (by C-G formula based on SCr of 1.03 mg/dL). []      On intermittent HD, scheduled: []      On CRRT    Antimicrobial dosage has been changed to:  vanc 1750mg  IV q36h (AUC 511.2, Scr 1.04, TBW)  Additional comments:   Thank you for allowing pharmacy to be a part of this patient's care.  Arley Phenix RPh 04/02/2023, 10:26 PM

## 2023-04-02 NOTE — Progress Notes (Signed)
eLink Physician-Brief Progress Note Patient Name: Fernando Young DOB: 24-Jan-1941 MRN: [id number]   Date of Service  04/02/2023  HPI/Events of Note  Notified of troponin at 7306.  82/M with CLL, CHF EF 40%, atrial fibrillation ( on eliquis at home), initially presenting with chest pressure.   Current EKG with sinus rhythm, PACs, ischemic changes in the lateral leads but no STEMI.    eICU Interventions  Spoke to cardiology Dr. Lajean Manes tonight who recommended to continue anticoagulation, aspirin, statin. Cards will see the patient in the morning.      Intervention Category Intermediate Interventions: Other:  Larinda Buttery 04/02/2023, 10:35 PM  1:32 AM Notified of troponin at 5,952 trending down from 7,306.   Plan> Continue current management.  6:41 AM WBC noted to be 190.1 <-- 176.4. Pt with underlying CLL.   Plan> Continue current management.

## 2023-04-02 NOTE — Progress Notes (Addendum)
eLink Physician-Brief Progress Note Patient Name: Fernando Young DOB: [birth year] MRN: [id number]   Date of Service  04/02/2023  HPI/Events of Note  82/M transferred from an outside facility for further evaluation of shock, probably septic.  Pt transferred on peripheral levophed.    eICU Interventions  Send CBC, CMP, lactic acid, urinalysis, blood cultures, troponin. CXR.  Continue levophed to keep MAP >65.  Pt needs DVT prophylaxis and empiric antibiotics. Will await bedside team evaluation.       Intervention Category Evaluation Type: New Patient Evaluation  Larinda Buttery 04/02/2023, 8:13 PM

## 2023-04-03 ENCOUNTER — Inpatient Hospital Stay (HOSPITAL_COMMUNITY): Payer: Medicare Other

## 2023-04-03 DIAGNOSIS — E785 Hyperlipidemia, unspecified: Secondary | ICD-10-CM

## 2023-04-03 DIAGNOSIS — R7989 Other specified abnormal findings of blood chemistry: Secondary | ICD-10-CM

## 2023-04-03 DIAGNOSIS — R079 Chest pain, unspecified: Secondary | ICD-10-CM | POA: Diagnosis not present

## 2023-04-03 DIAGNOSIS — I429 Cardiomyopathy, unspecified: Secondary | ICD-10-CM | POA: Diagnosis not present

## 2023-04-03 DIAGNOSIS — I10 Essential (primary) hypertension: Secondary | ICD-10-CM

## 2023-04-03 DIAGNOSIS — J189 Pneumonia, unspecified organism: Secondary | ICD-10-CM | POA: Diagnosis not present

## 2023-04-03 DIAGNOSIS — I480 Paroxysmal atrial fibrillation: Secondary | ICD-10-CM | POA: Diagnosis not present

## 2023-04-03 DIAGNOSIS — I214 Non-ST elevation (NSTEMI) myocardial infarction: Secondary | ICD-10-CM

## 2023-04-03 DIAGNOSIS — Z7901 Long term (current) use of anticoagulants: Secondary | ICD-10-CM

## 2023-04-03 DIAGNOSIS — R6521 Severe sepsis with septic shock: Secondary | ICD-10-CM | POA: Diagnosis not present

## 2023-04-03 DIAGNOSIS — C4490 Unspecified malignant neoplasm of skin, unspecified: Secondary | ICD-10-CM

## 2023-04-03 DIAGNOSIS — I4891 Unspecified atrial fibrillation: Secondary | ICD-10-CM | POA: Diagnosis not present

## 2023-04-03 DIAGNOSIS — A419 Sepsis, unspecified organism: Secondary | ICD-10-CM | POA: Diagnosis not present

## 2023-04-03 DIAGNOSIS — C9110 Chronic lymphocytic leukemia of B-cell type not having achieved remission: Secondary | ICD-10-CM

## 2023-04-03 LAB — BASIC METABOLIC PANEL
Anion gap: 8 (ref 5–15)
BUN: 28 mg/dL — ABNORMAL HIGH (ref 8–23)
CO2: 20 mmol/L — ABNORMAL LOW (ref 22–32)
Calcium: 7.9 mg/dL — ABNORMAL LOW (ref 8.9–10.3)
Chloride: 109 mmol/L (ref 98–111)
Creatinine, Ser: 0.84 mg/dL (ref 0.61–1.24)
GFR, Estimated: >60 mL/min (ref >60)
Glucose, Bld: 96 mg/dL (ref 70–99)
Potassium: 3.8 mmol/L (ref 3.5–5.1)
Sodium: 137 mmol/L (ref 135–145)

## 2023-04-03 LAB — CBC
HCT: 32.3 % — ABNORMAL LOW (ref 39.0–52.0)
Hemoglobin: 8.8 g/dL — ABNORMAL LOW (ref 13.0–17.0)
MCH: 27.7 pg (ref 26.0–34.0)
MCHC: 27.2 g/dL — ABNORMAL LOW (ref 30.0–36.0)
MCV: 101.6 fL — ABNORMAL HIGH (ref 80.0–100.0)
Platelets: 276 10*3/uL (ref 150–400)
RBC: 3.18 MIL/uL — ABNORMAL LOW (ref 4.22–5.81)
RDW: 17 % — ABNORMAL HIGH (ref 11.5–15.5)
WBC: 190.1 10*3/uL (ref 4.0–10.5)
nRBC: 0 % (ref 0.0–0.2)

## 2023-04-03 LAB — PATHOLOGIST SMEAR REVIEW

## 2023-04-03 LAB — PROTIME-INR
INR: 2.1 — ABNORMAL HIGH (ref 0.8–1.2)
Prothrombin Time: 23.5 s — ABNORMAL HIGH (ref 11.4–15.2)

## 2023-04-03 LAB — GLUCOSE, CAPILLARY
Glucose-Capillary: 129 mg/dL — ABNORMAL HIGH (ref 70–99)
Glucose-Capillary: 80 mg/dL (ref 70–99)
Glucose-Capillary: 94 mg/dL (ref 70–99)
Glucose-Capillary: 109 mg/dL — ABNORMAL HIGH (ref 70–99)
Glucose-Capillary: 107 mg/dL — ABNORMAL HIGH (ref 70–99)

## 2023-04-03 LAB — URINALYSIS, ROUTINE W REFLEX MICROSCOPIC
Bilirubin Urine: NEGATIVE
Glucose, UA: NEGATIVE mg/dL
Ketones, ur: NEGATIVE mg/dL
Nitrite: NEGATIVE
Protein, ur: 30 mg/dL — AB
RBC / HPF: >50 RBC/hpf (ref 0–5)
Specific Gravity, Urine: 1.017 (ref 1.005–1.030)
pH: 5 (ref 5.0–8.0)

## 2023-04-03 LAB — ECHOCARDIOGRAM COMPLETE
Area-P 1/2: 2.55 cm2
Calc EF: 41.3 %
S' Lateral: 4.7 cm
Single Plane A2C EF: 45.3 %
Single Plane A4C EF: 36.3 %
Weight: 2398.6 [oz_av]

## 2023-04-03 LAB — STREP PNEUMONIAE URINARY ANTIGEN: Strep Pneumo Urinary Antigen: NEGATIVE

## 2023-04-03 LAB — LACTIC ACID, PLASMA: Lactic Acid, Venous: 0.9 mmol/L (ref 0.5–1.9)

## 2023-04-03 LAB — PROCALCITONIN
Procalcitonin: 4.73 ng/mL
Procalcitonin: 4.93 ng/mL

## 2023-04-03 LAB — MAGNESIUM: Magnesium: 2 mg/dL (ref 1.7–2.4)

## 2023-04-03 LAB — MRSA NEXT GEN BY PCR, NASAL: MRSA by PCR Next Gen: DETECTED — AB

## 2023-04-03 LAB — HEMOGLOBIN A1C
Hgb A1c MFr Bld: 5.6 % (ref 4.8–5.6)
Mean Plasma Glucose: 114.02 mg/dL

## 2023-04-03 LAB — APTT: aPTT: 62 s — ABNORMAL HIGH (ref 24–36)

## 2023-04-03 LAB — TROPONIN I (HIGH SENSITIVITY): Troponin I (High Sensitivity): 5962 ng/L (ref <18)

## 2023-04-03 MED ORDER — VANCOMYCIN HCL 1.5 G IV SOLR
1500.0000 mg | INTRAVENOUS | Status: DC
  Administered 2023-04-03: 1500 mg via INTRAVENOUS
  Filled 2023-04-03: qty 30

## 2023-04-03 MED ORDER — IPRATROPIUM-ALBUTEROL 0.5-2.5 (3) MG/3ML IN SOLN
3.0000 mL | Freq: Two times a day (BID) | RESPIRATORY_TRACT | Status: DC
  Administered 2023-04-03 – 2023-04-05 (×6): 3 mL via RESPIRATORY_TRACT
  Filled 2023-04-03 (×5): qty 3

## 2023-04-03 MED ORDER — PERFLUTREN LIPID MICROSPHERE
1.0000 mL | INTRAVENOUS | Status: AC | PRN
  Administered 2023-04-03: 5 mL via INTRAVENOUS

## 2023-04-03 MED ORDER — ASPIRIN 81 MG PO TBEC
81.0000 mg | DELAYED_RELEASE_TABLET | Freq: Every day | ORAL | Status: DC
  Administered 2023-04-04 – 2023-04-08 (×5): 81 mg via ORAL
  Filled 2023-04-03 (×4): qty 1

## 2023-04-03 MED ORDER — ENOXAPARIN SODIUM 80 MG/0.8ML IJ SOSY
70.0000 mg | PREFILLED_SYRINGE | Freq: Two times a day (BID) | INTRAMUSCULAR | Status: DC
  Administered 2023-04-03: 70 mg via SUBCUTANEOUS
  Filled 2023-04-03: qty 0.8

## 2023-04-03 NOTE — Plan of Care (Signed)
  Problem: Education: Goal: Knowledge of General Education information will improve Description: Including pain rating scale, medication(s)/side effects and non-pharmacologic comfort measures Outcome: Progressing   Problem: Health Behavior/Discharge Planning: Goal: Ability to manage health-related needs will improve Outcome: Progressing   Problem: Clinical Measurements: Goal: Diagnostic test results will improve Outcome: Progressing Goal: Respiratory complications will improve Outcome: Progressing Goal: Cardiovascular complication will be avoided Outcome: Progressing   Problem: Activity: Goal: Risk for activity intolerance will decrease Outcome: Progressing   Problem: Coping: Goal: Level of anxiety will decrease Outcome: Progressing   Problem: Elimination: Goal: Will not experience complications related to bowel motility Outcome: Progressing Goal: Will not experience complications related to urinary retention Outcome: Progressing   Problem: Pain Management: Goal: General experience of comfort will improve Outcome: Progressing   Problem: Safety: Goal: Ability to remain free from injury will improve Outcome: Progressing   Problem: Education: Goal: Ability to describe self-care measures that may prevent or decrease complications (Diabetes Survival Skills Education) will improve Outcome: Progressing Goal: Individualized Educational Video(s) Outcome: Progressing   Problem: Coping: Goal: Ability to adjust to condition or change in health will improve Outcome: Progressing   Problem: Fluid Volume: Goal: Ability to maintain a balanced intake and output will improve Outcome: Progressing   Problem: Health Behavior/Discharge Planning: Goal: Ability to identify and utilize available resources and services will improve Outcome: Progressing Goal: Ability to manage health-related needs will improve Outcome: Progressing   Problem: Metabolic: Goal: Ability to maintain  appropriate glucose levels will improve Outcome: Progressing   Problem: Nutritional: Goal: Progress toward achieving an optimal weight will improve Outcome: Progressing   Problem: Tissue Perfusion: Goal: Adequacy of tissue perfusion will improve Outcome: Progressing

## 2023-04-03 NOTE — H&P (Signed)
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 Has mild abd pain, nausea and vomiting x1, and loose BM x1. Fever 38.8 C in ED  with chills, sinus tachycardia (140) and hypotensive (MAP 47 mmHg). Denied SOB, cough, wheezing, URT symptoms, palpitations, dizziness, LL edema, abd distension, melena, blood per rectum, rash, or urinary symptoms.  Quit smoking 1985 (smoked 20 py). No alcohol or illicit drug use. Was admitted 3 weeks ago with bowel obstruction fro 5 days and managed conservatively and discharged to rehab.  He is DNR/DNI and confirmed by family (see transfer notes).   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  LA 2.3, down to 1.2 after IVF bolus UA and resp PCR panel: negative PCXR: RML/RML infiltrates and LLL atelectasis. He has a port cath on the right side.  Repeat PCXR: small pleural effusions with basilar infiltration or atelectasis, similar to prior study. VBG: 7.35/27/48 Trop 0.295 Pro-BNP: 633 WBC 189.3, L 87.6% Hb 10.3 Other labs, PCXR and EKG were reviewed   Interim History / Subjective:  No events. Chest pain has eased  up. On low dose levophed.  Objective   Blood pressure (!) 111/59, pulse 85, temperature (!) 97.5 F (36.4 C), temperature source Oral, resp. rate (!) 28, weight 68 kg, SpO2 97%.        Intake/Output Summary (Last 24 hours) at 04/03/2023 1004 Last data filed at 04/03/2023 0605 Gross per 24 hour  Intake 1030.48 ml  Output 600 ml  Net 430.48 ml   Filed Weights   04/02/23 2100 04/03/23 0458  Weight: 68 kg 68 kg    Examination: Frail elderly man in NAD +temporal wasting MM dry, trachea midline Diminished breath sounds BL Port in place Ext with arthritic changes Moves to command Good pulses Garbled speech bc does not have dentures in Seems oriented  CXR reviewed Labs reviewed Echo reviewed  Resolved Hospital Problem list   Lactic acidosis  Assessment & Plan:  Septic shock due to PNA, MRSA swab + NSTEMI- query type II due to above vs. 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%), LAFB, RBBB Afib (on Eliquis), HTN and dyslipidemia HypoMg DM-2 RLS Moderate PCM PTA Hypothyroidism- TSH ok GERD Anemia of chronic illness CLL Facial BCC on Vismodegib and facial Sq.C.Ca  - f/u formal echo read - full dose lovenox, aspirin, statin, beta blocker on hold due to sepsis - still looks dry, continue IVF - Levophed for MAP 65 - Vanc + CAP coverage reasonable, check urinary Ag + Pct - Home meds as ordered - RD consult  Best Practice (right click and "Reselect all SmartList Selections" daily)   Diet/type: SLP eval DVT prophylaxis: lovenox full dose Pressure ulcer(s): not present on  admission  GI prophylaxis: H2B Lines: N/A Foley:  N/A Code Status:  DNR Last date of multidisciplinary goals of care discussion [pending]  32 min cc time Myrla Halsted MD PCCM

## 2023-04-03 NOTE — Progress Notes (Signed)
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  EF 40%.  IVF had expired.  Running at 40 cc an hour.  Low-dose norepinephrine infusion between 0-3 mcg with systemic antibiotics.  eICU Interventions  Hold additional fluids for the time being.  AM labs ordered.     Intervention Category Minor Interventions: Routine modifications to care plan (e.g. PRN medications for pain, fever)  Katalia Choma 04/03/2023, 10:26 PM

## 2023-04-03 NOTE — Consult Note (Addendum)
Cardiology Consultation   Patient ID: METRO PICKETT MRN: [id number]; DOB: [birth year]  Admit date: 04/02/2023 Date of Consult: 04/03/2023  PCP:  [name], MD    HeartCare Providers Cardiologist:  [facility] cardiology    Patient Profile:   Fernando Young is a 82 y.o. 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  History of Present Illness:   Fernando Young with above PMH presented to ER as a outside hospital transfer for further evaluation of septic shock. Patient is difficult to understand with baseline dysarthria. He states he went to the ER initially due to chest and abdominal pain started yesterday around 9am. He was sitting and resting at the time. He does not recall any significant SOB, dizziness, nausea, emesis, paresthesia, syncope. He had some fever, chills, non-productive cough that were present yesterday. He is always fatigued and weak, walks with a walker minimally at baseline due to debility. His daughter comes to his house daily to assist him with ADLs. He denied having any chest pain prior to this admission. He denied any previous cardiac catheterization, denied any hx of MI, CVA.   Diagnostic since admission showed bicarb 20, glucose 151, BUN 32, Cr 1.03, GFR >60. Albumin 2.8. LDL 29. Lactic acid 1 >0.9. Procal 4.73. WBC 176000, Hgb 9.1, plt 300K. Hs trop 7306 > 5962. UA with + Hgb and bacteria. Blood culture NTD. MRSA swab +. He was admitted to ICU for septic shock, hypotensive requiring levophed support, started on broad spectrum antibiotic for suspected pneumonia. Cardiology is consulted today for NSTEMI.   Per chart review, he has CLL and multiple skin cancer of face. He is on chemotherapy for multiple skin  lesions of his face that was concerned for [facility] by dermatology. He follows Dr Gilman Buttner for CLL, had treated with acalabrutinib in the past, course was complicated by anemia requiring transfusion, diarrhea, infectious colitis, melena, where alcalabrutinib was discontinued on September 15th, 2023. He was planned for initiation of Libtayo 02/10/23 based last office visit 02/03/23.   He was admitted in Nov 2024 at [facility] for infectious terminal ileitis. CT approx 5 cm radiodense linear object within the terminal ileum with inflammatory changes of the terminal ileum and a few loops of mildly dilated upstream small bowel suggesting a partial mechanical small bowel obstruction. Eccentric wall thickening of the cecum in the region of the ileocecal valve (6:30, 2:63) is worrisome for cecal carcinoma. He was treated with antibiotic and discharged to SNF.   Based on care everywhere, he follows [facility] cardiology Dr Novella Olive, suffers chronic systolic heart failure. Echo in 2015 with LVEF 40-45%. Stress myoview from 01/20/14 was low risk, there was a small in size, mild in severity, fixed defect involving the apical anterior and apical septal segments.  This was consistent with possible scar or attenuation artifact. Post stress LVEF 65%. Echo repeat in 04/2018 with LVEF 50-55%, grade II DD, mild reduced RV. Echo in 08/2019 showed LVEF 55-60%, grade II DD, mild pulmonary HTN PASP 43 mmHg. Echo in 05/07/21 showed LVEF 25%, apex is severely hypokinetic, mod TR. Cardiology note from 05/18/21 mentioned he was in acute CHF with EKG showing atrial flutter at the time. 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 He underwent DCCV 05/28/21. During his follow up with cardiology on 06/2021 he remained in sinus rhythm while on amiodarone, had improved CHF symptoms. Follow up with cardiology on 01/10/22, c/o chest pain, refused go to ER. Echo repeated  02/04/22 showed LVEF improved to 35%, apex hypokinetic, grade I DD, normal RV. Cardiac catheterization for ischemic evaluation was discussed in the past but ultimately declined by the patient given he was undergoing CLL treatment with many difficulties and had resolved chest pain. His BP was limiting GDMT and he was maintained on lasix and benazepril for CHF., bet blocker was not used due to profound fatigue and weakness he already has. He was last seen by his cardiologist at [facility] 02/06/23, noted he was taking midodrine (started by oncology) and felt this is not ideal for his cardiovascular disease. He was advised to stop midodrine, had his benazepril reduced from 20 to 5mg  daily. He was in sinus rhythm. It was felt his prognosis is poor given his cardiac disease as well as CLL. Most recent Echo from 09/21/22 showed LVEF 40%.      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 Authorizing Provider  atorvastatin (LIPITOR) 20 MG tablet Take 20 mg by mouth daily. 03/31/23  Yes [provider]  benazepril (LOTENSIN) 5 MG tablet Take 5 mg by mouth daily. 02/06/23  Yes [provider]  erythromycin ophthalmic ointment Place 1 Application into both eyes 3 (three) times daily. 03/31/23  Yes [provider]  linezolid (ZYVOX) 600 MG tablet Take 600 mg by mouth 2 (two) times daily. 02/06/23  Yes [provider]   melatonin 3 MG TABS tablet Take 3 mg by mouth at bedtime as needed. 03/14/23  Yes [provider]  polyethylene glycol (MIRALAX / GLYCOLAX) 17 g packet Take 17 g by mouth daily. Hold for diarrhea 03/15/23 04/14/23 Yes [provider]  rOPINIRole (REQUIP) 1 MG tablet Take 1 mg by mouth at bedtime. 03/31/23  Yes [provider]  senna (SENOKOT) 8.6 MG tablet Take 2 tablets by mouth at bedtime as needed for constipation. 03/14/23 04/13/23 Yes [provider]  acetaminophen (TYLENOL) 500 MG tablet Take 1,000 mg by mouth every 6 (six) hours as needed for mild pain, moderate pain or fever.    [provider]  amiodarone (PACERONE) 200 MG tablet Take 200 mg by mouth daily. 03/10/20   [provider]  apixaban (ELIQUIS) 5 MG TABS tablet Take 1 tablet by mouth 2 (two) times daily. 02/09/22   [provider]  Ascorbic Acid (VITAMIN C) 500 MG CAPS Take 500 mg by mouth daily.    [provider]  bismuth subsalicylate (PEPTO BISMOL) 262 MG/15ML suspension Take 30 mLs by mouth every 6 (six) hours as needed. For constipation 01/05/22   [provider]  cefdinir (OMNICEF) 300  MG capsule Take 300 mg by mouth 2 (two) times daily. 01/30/23   [provider]  cholecalciferol (VITAMIN D3) 25 MCG (1000 UNIT) tablet Take 1,000 Units by mouth daily.    [provider]  cholestyramine (QUESTRAN) 4 g packet Take 1 packet by mouth 3 (three) times daily. 10/19/22   [provider]  diphenoxylate-atropine (LOMOTIL) 2.5-0.025 MG tablet Take by mouth. 11/28/22   [provider]  furosemide (LASIX) 40 MG tablet Take 0.5 tablets (20 mg total) by mouth daily. 02/22/22   Marguerita Merles Latif, DO  levothyroxine (SYNTHROID) 75 MCG tablet Take 75 mcg by mouth daily before breakfast. 12/06/21   [provider]  metFORMIN (GLUCOPHAGE-XR) 500 MG 24 hr tablet Take 500 mg by mouth 2 (two) times daily. Patient not taking:  Reported on 01/18/2023 12/03/20   [provider]  midodrine (PROAMATINE) 5 MG tablet TAKE 1 TABLET (5 MG TOTAL) BY MOUTH 3 (THREE) TIMES DAILY WITH MEALS. 11/01/22   Dellia Beckwith, MD  ondansetron (ZOFRAN) 4 MG tablet Take 1 tablet (4 mg total) by mouth every 6 (six) hours as needed for nausea. 02/22/22   Sheikh, Omair Latif, DO  pantoprazole (PROTONIX) 40 MG tablet TAKE 1 TABLET BY MOUTH EVERY DAY 03/08/22   Mosher, Harvin Hazel A, PA-C  rosuvastatin (CRESTOR) 20 MG tablet Take 10 mg by mouth daily. 02/04/21   [provider]    Inpatient Medications: Scheduled Meds:  amiodarone  200 mg Oral Daily   aspirin EC  325 mg Oral Daily   atorvastatin  80 mg Oral Daily   Chlorhexidine Gluconate Cloth  6 each Topical Daily   enoxaparin (LOVENOX) injection  1 mg/kg Subcutaneous Q12H   famotidine  20 mg Oral QHS   insulin aspart  0-9 Units Subcutaneous Q4H   ipratropium-albuterol  3 mL Nebulization BID   levothyroxine  75 mcg Oral Q0600   midodrine  5 mg Oral TID WC   rOPINIRole  0.5-1 mg Oral QHS   Continuous Infusions:  azithromycin Stopped (04/02/23 2354)   cefTRIAXone (ROCEPHIN)  IV Stopped (04/03/23 0030)   lactated ringers 40 mL/hr at 04/03/23 0600   norepinephrine (LEVOPHED) Adult infusion 3 mcg/min (04/03/23 0600)   [START ON 04/04/2023] vancomycin     PRN Meds: acetaminophen, bismuth subsalicylate, docusate sodium, ondansetron (ZOFRAN) IV, polyethylene glycol, sodium chloride flush, zolpidem  Allergies:   No Known Allergies  Social History:   Social History   Socioeconomic History   Marital status: Divorced    Spouse name: Not on file   Number of children: 2   Years of education: Not on file   Highest education level: Not on file  Occupational History   Not on file  Tobacco Use   Smoking status: Former   Smokeless tobacco: Never  Substance and Sexual Activity   Alcohol use: Not Currently   Drug use: Never   Sexual activity: Not Currently  Other Topics  Concern   Not on file  Social History Narrative   Not on file   Social Determinants of Health   Financial Resource Strain: Low Risk  (04/18/2022)   Received from [facility], [facility]   Overall Financial Resource Strain (CARDIA)    Difficulty of Paying Living Expenses: Not hard at all  Food Insecurity: No Food Insecurity (04/03/2023)   Hunger Vital Sign    Worried About Running Out of Food in the Last Year: Never true    Ran Out of Food in the Last  Year: Never true  Transportation Needs: No Transportation Needs (04/03/2023)   PRAPARE - Administrator, Civil Service (Medical): No    Lack of Transportation (Non-Medical): No  Physical Activity: Sufficiently Active (04/18/2022)   Received from [facility], [facility]   Exercise Vital Sign    Days of Exercise per Week: 7 days    Minutes of Exercise per Session: 150+ min  Stress: No Stress Concern Present (04/18/2022)   Received from [facility], [facility] - Occupational Stress Questionnaire    Feeling of Stress : Not at all  Social Connections: Moderately Isolated (04/18/2022)   Received from [facility], [facility]   Social Connection and Isolation Panel [NHANES]    Frequency of Communication with Friends and Family: More than three times a week    Frequency of Social Gatherings with Friends and Family: More than three times a week    Attends Religious Services: Never    Database administrator or Organizations: Yes    Attends Banker Meetings: Never    Marital Status: Divorced  Catering manager Violence: Not At Risk (04/03/2023)   Humiliation, Afraid, Rape, and Kick questionnaire    Fear of Current or Ex-Partner: No    Emotionally Abused: No    Physically Abused: No    Sexually Abused: No    Family History:    Family History  Problem Relation Age of Onset   Ovarian cancer Mother 36   Cancer Sister 72     ROS:   Constitutional: see HPI  Eyes: Denied vision change or loss Ears/Nose/Mouth/Throat: Denied ear ache, sore throat, coughing, sinus pain Cardiovascular: see HPI  Respiratory:see HPI  Gastrointestinal: chronic diarrhea  Genital/Urinary: Denied dysuria, hematuria, urinary frequency/urgency Musculoskeletal: chronic weakness  Skin: skin cancer and leg wound  Neuro: Denied headache, dizziness, syncope Psych: Denied history of depression/anxiety  Endocrine: history of diabetes   Physical Exam/Data:   Vitals:   04/03/23 0830 04/03/23 0900 04/03/23 0930 04/03/23 1000  BP: 114/72 117/66 115/70 (!) 111/59  Pulse: 78 82 70 85  Resp: 14 (!) 22 17 (!) 28  Temp:      TempSrc:      SpO2: 100% 99% 100% 97%  Weight:        Intake/Output Summary (Last 24 hours) at 04/03/2023 1153 Last data filed at 04/03/2023 9604 Gross per 24 hour  Intake 1030.48 ml  Output 600 ml  Net 430.48 ml      04/03/2023    4:58 AM 04/02/2023    9:00 PM 02/10/2023    2:30 PM  Last 3 Weights  Weight (lbs) 149 lb 14.6 oz 149 lb 14.6 oz 150 lb  Weight (kg) 68 kg 68 kg 68.04 kg     Body mass index is 21.51 kg/m.   Vitals:  Vitals:   04/03/23 0930 04/03/23 1000  BP: 115/70 (!) 111/59  Pulse: 70 85  Resp: 17 (!) 28  Temp:    SpO2: 100% 97%   General Appearance: In no apparent distress, laying in bed, chronic ill, frail elderly, cachectic  HEENT: Normocephalic, atraumatic.  Neck: Supple, trachea midline, no JVDs Cardiovascular: Regular rate and rhythm, normal S1-S2,  no murmur Respiratory: Resting breathing unlabored, lungs sounds with fine crackles bilaterally, no use of accessory muscles. On 2LNC oxygen. Speaks full sentence. Occasional cough noted   Gastrointestinal: Bowel sounds positive, abdomen soft, non-tender Extremities: Able to move all  extremities in bed without difficulty, no edema of BLE Musculoskeletal: Generalized muscle wasting  Skin: Numerous skin lesions noted of face, arms, and legs   Neurologic: Alert, oriented to person, place and time.Dysarthria. No cognitive deficit. 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 Technically difficult study with reduced echo windows   2. No apical thrombus with Definity enhancing agent. Left ventricular  ejection fraction, by estimation, is 35 to 40%. The left ventricle has  moderately decreased function. The left ventricle demonstrates regional  wall motion abnormalities (see scoring  diagram/findings for description). Left ventricular diastolic parameters  are consistent with Grade I diastolic dysfunction (impaired relaxation).  There is severe hypokinesis of the left ventricular, mid-apical septal  wall, apical segment, anteroseptal  wall, inferior wall and anterior wall.   3. Right ventricular systolic function is mildly reduced. The right  ventricular size is normal. There is normal pulmonary artery systolic  pressure. The estimated right ventricular systolic pressure is 33.8 mmHg.   4. Left atrial size was moderately dilated.   5. The mitral valve is abnormal. Mild mitral valve regurgitation.   6. The tricuspid valve is abnormal.   7. The aortic valve was not well visualized. Aortic valve regurgitation  is not visualized.   8. The inferior vena cava is normal in size with <50% respiratory  variability, suggesting right atrial pressure of 8 mmHg.   Comparison(s): No prior Echocardiogram.    Echo from 09/21/22 at [facility] health care:  Summary   1. The left ventricle is normal in size with normal wall thickness.    2. The left ventricular systolic function is mildly decreased, LVEF is  visually estimated at 40%.    3. The right ventricle is normal in  size, with normal systolic function.    4. Technically difficult study.    5. An ultrasound enhancing agent was used to improve the visualization of  the left ventricular cavity and endocardial borders.    Left Ventricle    The left ventricle is normal in size with normal wall thickness. The left  ventricular systolic function is mildly decreased, LVEF is visually estimated  at 40%. Contrast administered improved wall motion interpretation. No  ventricular thrombus but there does appear to be relative stasis at the apex.   Right Ventricle    The right ventricle is normal in size, with normal systolic function.    Ventricles  ----------------------------------------------------------------------  Name                                Value        Normal  ----------------------------------------------------------------------   LV Dimensions 2D/MM  ----------------------------------------------------------------------   IVS Diastolic Thickness  (2D)                                1.0 cm       0.6-1.0  LVID Diastole (2D)                  5.4 cm       4.2-5.8   LVPW Diastolic Thickness  (  2D)                                1.0 cm       0.6-1.0  LVID Systole (2D)                   4.8 cm       2.5-4.0  LV Mass Index (2D Cubed)          110 g/m2        49-115   Relative Wall Thickness  (2D)                                  0.37   Mitral Valve  ----------------------------------------------------------------------  Name                                Value        Normal  ----------------------------------------------------------------------   MV Diastolic Function  ----------------------------------------------------------------------  MV E Peak Velocity                 57 cm/s                MV A Peak Velocity                 35 cm/s                MV E/A                                 1.6                 MV Annular TDI   ----------------------------------------------------------------------  MV Septal e' Velocity             3.7 cm/s         >=8.0  MV E/e' (Septal)                      15.4                MV Lateral e' Velocity            8.8 cm/s        >=10.0  MV E/e' (Lateral)                      6.5                MV e' Average                     6.3 cm/s                MV E/e' (Average)                     10.9    Laboratory Data:  High Sensitivity Troponin:   Recent Labs  Lab 04/02/23 2129 04/02/23 2349  TROPONINIHS 7,306* 5,962*     Chemistry Recent Labs  Lab 04/02/23 2100 04/03/23 0544  NA 138 137  K 4.5 3.8  CL 109 109  CO2 20* 20*  GLUCOSE 151* 96  BUN 32* 28*  CREATININE 1.03 0.84  CALCIUM 8.0* 7.9*  MG  --  2.0  GFRNONAA >60 >60  ANIONGAP 9 8    Recent Labs  Lab 04/02/23 2100  PROT 5.4*  ALBUMIN 2.8*  AST 51*  ALT 19  ALKPHOS 64  BILITOT 0.5   Lipids  Recent Labs  Lab 04/02/23 2129  CHOL 71  TRIG 54  HDL 31*  LDLCALC 29  CHOLHDL 2.3    Hematology Recent Labs  Lab 04/02/23 2100 04/03/23 0544  WBC 176.4* 190.1*  RBC 3.17* 3.18*  HGB 9.1* 8.8*  HCT 31.9* 32.3*  MCV 100.6* 101.6*  MCH 28.7 27.7  MCHC 28.5* 27.2*  RDW 16.9* 17.0*  PLT 300 276   Thyroid No results for input(s): "TSH", "FREET4" in the last 168 hours.  BNPNo results for input(s): "BNP", "PROBNP" in the last 168 hours.  DDimer No results for input(s): "DDIMER" in the last 168 hours.   Radiology/Studies:  ECHOCARDIOGRAM COMPLETE  Result Date: 04/03/2023    ECHOCARDIOGRAM REPORT   Patient Name:   Fernando Young Date of Exam: 04/03/2023 Medical Rec #:  [id number]      Height:       70.0 in Accession #:    [id number]     Weight:       149.9 lb Date of Birth:  April 26, 1941      BSA:          1.847 m Patient Age:    82 years       BP:           114/72 mmHg Patient Gender: M              HR:           81 bpm. Exam Location:  Inpatient Procedure: 2D Echo, Color Doppler, Cardiac Doppler and  Intracardiac            Opacification Agent Indications:    Elevated Troponin  History:        Patient has no prior history of Echocardiogram examinations.                 CHF; Risk Factors:Hypertension, Diabetes and Dyslipidemia.  Sonographer:    Harriette Bouillon RDCS Referring Phys: [id number] OMAR M [place]I  Sonographer Comments: Technically difficult study due to poor echo windows. IMPRESSIONS  1. Technically difficult study with reduced echo windows  2. No apical thrombus with Definity enhancing agent. Left ventricular ejection fraction, by estimation, is 35 to 40%. The left ventricle has moderately decreased function. The left ventricle demonstrates regional wall motion abnormalities (see scoring diagram/findings for description). Left ventricular diastolic parameters are consistent with Grade I diastolic dysfunction (impaired relaxation). There is severe hypokinesis of the left ventricular, mid-apical septal wall, apical segment, anteroseptal wall, inferior wall and anterior wall.  3. Right ventricular systolic function is mildly reduced. The right ventricular size is normal. There is normal pulmonary artery systolic pressure. The estimated right ventricular systolic pressure is 33.8 mmHg.  4. Left atrial size was moderately dilated.  5. The mitral valve is abnormal. Mild mitral valve regurgitation.  6. The tricuspid valve is abnormal.  7. The aortic valve was not well visualized. Aortic valve regurgitation is not visualized.  8. The inferior vena cava is normal in size with <50% respiratory variability, suggesting right atrial pressure of 8 mmHg. Comparison(s): No prior Echocardiogram. FINDINGS  Left Ventricle: No apical thrombus with Definity enhancing agent. Left ventricular ejection fraction, by estimation, is 35 to 40%. The left ventricle has moderately decreased function. The left ventricle demonstrates regional wall motion abnormalities.  Severe hypokinesis of the left ventricular, mid-apical septal wall,  apical segment, anteroseptal wall, inferior wall and anterior wall. Definity contrast agent was given IV to delineate the left ventricular endocardial borders. The left ventricular internal cavity size was normal in size. There is no left ventricular hypertrophy. Left ventricular diastolic parameters are consistent with Grade I diastolic dysfunction (impaired relaxation). Indeterminate filling pressures.  LV Wall Scoring: The mid and distal anterior septum and entire apex are hypokinetic. Right Ventricle: The right ventricular size is normal. No increase in right ventricular wall thickness. Right ventricular systolic function is mildly reduced. There is normal pulmonary artery systolic pressure. The tricuspid regurgitant velocity is 2.54 m/s, and with an assumed right atrial pressure of 8 mmHg, the estimated right ventricular systolic pressure is 33.8 mmHg. Left Atrium: Left atrial size was moderately dilated. Right Atrium: Right atrial size was normal in size. Pericardium: Trivial pericardial effusion is present. The pericardial effusion is localized near the right ventricle. Mitral Valve: The mitral valve is abnormal. Mild mitral valve regurgitation. Tricuspid Valve: The tricuspid valve is abnormal. Tricuspid valve regurgitation is mild. Aortic Valve: The aortic valve was not well visualized. Aortic valve regurgitation is not visualized. Pulmonic Valve: The pulmonic valve was not well visualized. Pulmonic valve regurgitation is not visualized. Aorta: The aortic root and ascending aorta are structurally normal, with no evidence of dilitation. Venous: The inferior vena cava is normal in size with less than 50% respiratory variability, suggesting right atrial pressure of 8 mmHg. IAS/Shunts: No atrial level shunt detected by color flow Doppler.  LEFT VENTRICLE PLAX 2D LVIDd:         5.70 cm      Diastology LVIDs:         4.70 cm      LV e' medial:    4.90 cm/s LV PW:         0.80 cm      LV E/e' medial:  9.8 LV IVS:         0.70 cm      LV e' lateral:   8.16 cm/s LVOT diam:     2.20 cm      LV E/e' lateral: 5.9 LV SV:         69 LV SV Index:   37 LVOT Area:     3.80 cm  LV Volumes (MOD) LV vol d, MOD A2C: 113.2 ml LV vol d, MOD A4C: 138.9 ml LV vol s, MOD A2C: 61.9 ml LV vol s, MOD A4C: 88.4 ml LV SV MOD A2C:     51.3 ml LV SV MOD A4C:     138.9 ml LV SV MOD BP:      51.6 ml RIGHT VENTRICLE            IVC RV S prime:     8.38 cm/s  IVC diam: 1.80 cm TAPSE (M-mode): 2.3 cm LEFT ATRIUM           Index        RIGHT ATRIUM           Index LA diam:      3.90 cm 2.11 cm/m   RA Area:     15.90 cm LA Vol (A2C): 38.8 ml 21.01 ml/m  RA Volume:   42.70 ml  23.12 ml/m LA Vol (A4C): 70.2 ml 38.01 ml/m  AORTIC VALVE LVOT Vmax:   99.20 cm/s LVOT Vmean:  68.300 cm/s LVOT VTI:    0.182 m  AORTA Ao Root diam: 3.20  cm MITRAL VALVE               TRICUSPID VALVE MV Area (PHT): 2.55 cm    TR Peak grad:   25.8 mmHg MV Decel Time: 298 msec    TR Vmax:        254.00 cm/s MV E velocity: 48.00 cm/s MV A velocity: 84.80 cm/s  SHUNTS MV E/A ratio:  0.57        Systemic VTI:  0.18 m                            Systemic Diam: 2.20 cm Zoila Shutter MD Electronically signed by Zoila Shutter MD Signature Date/Time: 04/03/2023/11:27:35 AM    Final    DG CHEST PORT 1 VIEW  Result Date: 04/02/2023 CLINICAL DATA:  Leukocytosis EXAM: PORTABLE CHEST 1 VIEW COMPARISON:  02/20/2022 FINDINGS: Right central venous catheter with tip over the low SVC region. No pneumothorax. Cardiac enlargement. Probable small bilateral pleural effusions with infiltration or atelectasis in the lung bases. This is similar to prior study. Calcification of the aorta. Mediastinal contours appear intact. IMPRESSION: Cardiac enlargement. Small pleural effusions with basilar infiltration or atelectasis, similar to prior study. Electronically Signed   By: Burman Nieves M.D.   On: 04/02/2023 20:51     Assessment and Plan:   NSTEMI Chronic systolic and diastolic heart failure,  etiology unclear  - presented to ER for chest and abdominal pain, fever, chills, cough, chronic baselinediarrhea/fatigue/weakness; in the setting of CLL, numerous skin cancer, chronic anemia; felt to be in septic shock due to pneumonia  - Hs trop 7306 > 5962 - EKG non diagnostic  - CXR showed Small pleural effusions with basilar infiltration or atelectasis - LDL 29, A1C 5.6% - Echo today showed LVEF 35-40%, grade I DD, severe hypokinesis of the left ventricular, mid-apical septal  wall, apical segment, anteroseptal wall, inferior wall and anterior wall. Mild reduced RV. (LVEF 40% on Echo 08/2022, overall stable) - Historically ischemic evaluation by cardiac catheterization had been discussed by his [facility] cardiologist, ultimately he refused due to ongoing treatment for CLL. He has significant baseline fatigue/weakness/debility due to underlying CLL and treatment, has ongoing anemia requiring transfusion intermittently, chronic diarrhea with melena,  he remains a poor candidate for invasive cardiac catheterization to evaluate for CAD, also he continue to agree with conservative management for his cardiac conditions and wish to avoid procedures  - certainly can be type II due to septic shock, ACS can't rule out  - would start medical therapy for presumed CAD with ASA 81mg  daily, lipitor 80mg  daily; no BB given profound fatigue/weakness; no heparin gtt or therapeutic lovenox given high risk of bleeding and ongoing anemia from CLL and already on  PTA Eliquis  - GDMT for CHF: BP remains need of levophed support, hold off adding additional agents; historically only able to tolerate benazepril   Paroxysmal A fib/flutter - in sinus rhythm, continue PTA amiodarone and eliquis   Septic shock due to presumed pneumonia  CLL Skin cancer Debility  Malnutrition  Type 2 DM  Anemia  - per primary team   Risk Assessment/Risk Scores:   TIMI Risk Score for Unstable Angina or Non-ST Elevation MI:   The patient's  TIMI risk score is 3, which indicates a 13% risk of all cause mortality, new or recurrent myocardial infarction or need for urgent revascularization in the next 14 days.{   New York Heart Association (NYHA) Functional Class  NYHA Class II  CHA2DS2-VASc Score = 5  This indicates a 7.2% annual risk of stroke. The patient's score is based upon: CHF History: 1 HTN History: 1 Diabetes History: 1 Stroke History: 0 Vascular Disease History: 0 Age Score: 2 Gender Score: 0   For questions or updates, please contact [facility] Please consult [email or web address] for contact info under    Signed, Cyndi Bender, NP  04/03/2023 11:53 AM  ADDENDUM:   Patient seen and examined with Cyndi Bender, NP   I personally taken a history, examined the patient, reviewed relevant notes,  laboratory data / imaging studies.  I performed a substantive portion of this encounter and formulated the important aspects of the plan.  I agree with the APP's note, impression, and recommendations; however, I have edited the note to reflect changes or salient points.  Patient is accompanied by her daughter-in-law and grandsons at bedside.  Patient provides verbal consent with having them present during today's encounter.  Patient was transferred from [facility] to [facility] for concerns for septic shock.  After presentation to [facility] he was admitted to ICU and started on pressors and antibiotics.  Lab work concerning for NSTEMI and therefore cardiology consulted.  Patient had chest pain prior to arrival to ED but since then has been chest pain-free.  He denies orthopnea, PND.  He does have lower extremity swelling.  He denies near-syncope or syncopal events.  EKG on arrival did not illustrate STEMI. Creatinine on arrival 1.03 mg/dL. High sensitive troponins peaked at 7306, now downtrending. Echocardiogram today notes a moderately reduced LVEF, with regional wall motion abnormalities, grade 1 diastolic  dysfunction, mild RV systolic dysfunction, right ventricular size normal, no pulmonary hypertension per echocardiography, estimated RAP 8 mmHg   PHYSICAL EXAM: Today's Vitals   04/03/23 1600 04/03/23 1630 04/03/23 1700 04/03/23 1730  BP: 114/81 (!) 108/55 95/62 100/65  Pulse: 82 80 73 74  Resp: 15 (!) 23 17 14   Temp:      TempSrc:      SpO2: (!) 78% 96% 96% 96%  Weight:      PainSc:       Body mass index is 21.51 kg/m.   Net IO Since Admission: 996 mL [04/03/23 1837]  Filed Weights   04/02/23 2100 04/03/23 0458  Weight: 68 kg 68 kg    Physical Exam  Constitutional: He appears cachectic. No distress. He appears chronically ill.  hemodynamically stable  HENT:  Oropharynx is dry, poor dentition  Neck: No JVD present.  Cardiovascular: Normal rate, regular rhythm, S1 normal and S2 normal. Exam reveals no gallop, no S3 and no S4.  No murmur heard. Pulmonary/Chest: Effort normal and breath sounds normal. No stridor. He has no wheezes. He has no rales.  Abdominal: Soft. Bowel sounds are normal. He exhibits no distension. There is no abdominal tenderness.  Musculoskeletal:        General: Edema present.     Cervical back: Neck supple.  Neurological: He is alert and oriented to person, place, and time. He has intact cranial nerves (2-12).  Skin: Skin is warm.    EKG: (personally reviewed by me) 04/02/2023: Sinus rhythm, 83 bpm, PACs, right bundle branch block, left anterior fascicular block  Telemetry: (personally reviewed by me) Sinus rhythm with PVCs   Impression:  NSTEMI Cardiomyopathy Septic shock secondary to pneumonia, MRSA swab positive Paroxysmal atrial fibrillation. Hypertension. Dyslipidemia. Diabetes mellitus type 2. CLL Skin cancer Anemia of chronic illness  Recommendations:  NSTEMI: Cardiomyopathy: Patient  endorses chest pain prior to reaching [facility]. Since arrival to bolus: Patient denies anginal chest pain. EKG not consistent with  STEMI. His recent NSTEMI likely secondary to type I (given the moderately reduced LVEF and regional wall motion abnormalities) and type II physiology. Given his comorbidities the patient is opted out to medical management in the past and still feels the same.  He does not want undergo left heart catheterization. Noted to have LVEF of around 40% back in May 2024.   Follows with outpatient cardiology at [facility] regularly. Currently on oral anticoagulation given his paroxysmal atrial fibrillation. Continue aspirin 81 mg p.o. daily.   Continue statin therapy. Patient was weaned off of Levophed about 30 minutes ago. Will uptitrate GDMT as hemodynamics and laboratory values allow.  Of note, in the past all he is tolerated his benazepril 5 mg p.o. daily.  He requires midodrine for blood pressure support.  Septic shock secondary to pneumonia: Required Levophed support. Currently on broad-spectrum antibiotics. Currently admitted to ICU  Paroxysmal atrial fibrillation: Rate control: N/A. Rhythm control: Amiodarone. Thromboembolic prophylaxis: Eliquis Remains in sinus rhythm with PVCs-on telemetry  Further recommendations to follow as the case evolves.   This note was created using a voice recognition software as a result there may be grammatical errors inadvertently enclosed that do not reflect the nature of this encounter. Every attempt is made to correct such errors.   Tessa Lerner, DO, [facility]  [address] [place], [place] [address] Pager: [phone] Office: [phone] 04/03/2023 6:37 PM

## 2023-04-03 NOTE — Progress Notes (Signed)
PHARMACY NOTE:  ANTIMICROBIAL RENAL DOSAGE ADJUSTMENT  Current antimicrobial regimen includes a mismatch between antimicrobial dosage and estimated renal function.  As per policy approved by the Pharmacy & Therapeutics and Medical Executive Committees, the antimicrobial dosage will be adjusted accordingly.  Current antimicrobial dosage:  Vancomycin 1750 mg IV q36h  Indication: sepsis/pna  Renal Function: Estimated Creatinine Clearance: 63 mL/min (by C-G formula based on SCr of 0.84 mg/dL).    Antimicrobial dosage has been changed to:  Vancomycin 1500 mg IV q24h (SCr 0.84, est AUC 506)  Additional comments:   Thank you for allowing pharmacy to be a part of this patient's care.   Lynann Beaver PharmD, BCPS [facility] main pharmacy [phone] 04/03/2023 12:29 PM

## 2023-04-03 NOTE — Evaluation (Signed)
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 male who presented on 12/01 to the ED with chest pressure, fever, chills, and sinus tachycardia. Previous admission 3 weeks ago with bowel obstruction, discharged to rehab. 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 SLP swallow eval ordered 12/02.    Assessment / Plan / Recommendation  Clinical Impression  Patient seen by SLP for bedside swallow evaluation. Patient was awake, alert, and agreeable throughout the session. His dentures were available at bedside, and patient stated he wears them during meal time. Speech was moderately dysarthric in nature, likely exacerbated by dentures not being in place. Prior to this hospitalization, patient described his diet at the rehab similarly to a minced diet. He could not recall if his liquids were thickened. SLP administered sips of thin liquid via straw and bites of puree via teaspoon. Single straw sips of thin tolerated well, however  sequential sips resulted in immediate cough. Patient stated he occasionally coughs after eating and drinking, but it is an unproductive cough. Bites of puree administered w/o s/sx aspiration, however patient stated he felt as though it was "getting hung up" in his throat. SLP discussed different diet choices with patient who endorsed dys 3 (mechanical soft), thin liquids as being most appropriate at this time. SLP will continue to follow for diet tolerate and for potential modified barium swallow study as indicated. SLP Visit Diagnosis: Dysphagia, unspecified (R13.10)    Aspiration Risk  Mild aspiration risk    Diet Recommendation Dysphagia 3 (Mech soft);Thin liquid    Liquid Administration via: Straw;Cup Medication Administration: Whole meds with puree Supervision: Staff to assist with self feeding Compensations: Minimize environmental distractions;Slow rate;Small sips/bites Postural Changes: Seated upright at 90 degrees    Other  Recommendations Oral Care Recommendations: Oral care BID;Staff/trained caregiver to provide oral care    Recommendations for follow up therapy are one component of a multi-disciplinary discharge planning process, led by the attending physician.  Recommendations may be updated based on patient status, additional functional criteria and insurance authorization.  Follow up Recommendations Other (comment) (TBD)      Assistance Recommended at Discharge    Functional Status Assessment Patient has had a recent decline in their functional status and demonstrates the ability to make significant improvements in function in a reasonable and predictable amount of time.  Frequency and Duration min 2x/week  2 weeks       Prognosis Prognosis for improved oropharyngeal function: Good      Swallow  Study   General Date of Onset: 04/02/23 HPI: Patient is an 82 y.o. male who presented on 12/01 to the ED with chest pressure, fever, chills, and sinus tachucardia. Previous  admission 3 weeks ago with bowel obstruction, dischagred to rehab. 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 SLP swallow eval ordered 12/02. Type of Study: Bedside Swallow Evaluation Previous Swallow Assessment: N/a Diet Prior to this Study: NPO Temperature Spikes Noted: No Respiratory Status: Nasal cannula History of Recent Intubation: No Behavior/Cognition: Alert;Cooperative Oral Cavity Assessment: Within Functional Limits Oral Care Completed by SLP: No Oral Cavity - Dentition: Dentures, top;Dentures, bottom;Edentulous Vision: Functional for self-feeding Self-Feeding Abilities: Able to feed self;Needs assist Patient Positioning: Upright in bed Baseline Vocal Quality: Normal Volitional Swallow: Able to elicit    Oral/Motor/Sensory Function Overall Oral Motor/Sensory Function: Within functional limits   Ice Chips Ice chips: Not tested   Thin Liquid Thin Liquid: Impaired Presentation: Straw Pharyngeal  Phase Impairments: Cough - Delayed    Nectar Thick     Honey Thick     Puree Puree: Within functional limits Presentation: Spoon   Solid     Solid: Not tested      Marline Backbone, B.S., Speech Therapy Student   04/03/2023,12:40 PM

## 2023-04-03 NOTE — Progress Notes (Signed)
  Echocardiogram 2D Echocardiogram has been performed. Pt seemed to have minor but normal side affect to Definity imaging enhancing agent. Had some back pain at the very end of study, about 3-4 mins after initial push of agent. Notified nurse.  Leda Roys RDCS 04/03/2023, 9:25 AM

## 2023-04-04 DIAGNOSIS — I5022 Chronic systolic (congestive) heart failure: Secondary | ICD-10-CM

## 2023-04-04 DIAGNOSIS — R6521 Severe sepsis with septic shock: Secondary | ICD-10-CM | POA: Diagnosis not present

## 2023-04-04 DIAGNOSIS — I214 Non-ST elevation (NSTEMI) myocardial infarction: Secondary | ICD-10-CM | POA: Diagnosis not present

## 2023-04-04 DIAGNOSIS — I429 Cardiomyopathy, unspecified: Secondary | ICD-10-CM | POA: Diagnosis not present

## 2023-04-04 DIAGNOSIS — J189 Pneumonia, unspecified organism: Secondary | ICD-10-CM | POA: Diagnosis not present

## 2023-04-04 DIAGNOSIS — I4891 Unspecified atrial fibrillation: Secondary | ICD-10-CM

## 2023-04-04 DIAGNOSIS — A419 Sepsis, unspecified organism: Secondary | ICD-10-CM | POA: Diagnosis not present

## 2023-04-04 LAB — GLUCOSE, CAPILLARY
Glucose-Capillary: 105 mg/dL — ABNORMAL HIGH (ref 70–99)
Glucose-Capillary: 125 mg/dL — ABNORMAL HIGH (ref 70–99)
Glucose-Capillary: 179 mg/dL — ABNORMAL HIGH (ref 70–99)
Glucose-Capillary: 88 mg/dL (ref 70–99)
Glucose-Capillary: 91 mg/dL (ref 70–99)
Glucose-Capillary: 99 mg/dL (ref 70–99)

## 2023-04-04 LAB — CBC
HCT: 30 % — ABNORMAL LOW (ref 39.0–52.0)
Hemoglobin: 8.4 g/dL — ABNORMAL LOW (ref 13.0–17.0)
MCH: 27.5 pg (ref 26.0–34.0)
MCHC: 28 g/dL — ABNORMAL LOW (ref 30.0–36.0)
MCV: 98 fL (ref 80.0–100.0)
Platelets: 292 10*3/uL (ref 150–400)
RBC: 3.06 MIL/uL — ABNORMAL LOW (ref 4.22–5.81)
RDW: 16.9 % — ABNORMAL HIGH (ref 11.5–15.5)
WBC: 171.2 10*3/uL (ref 4.0–10.5)
nRBC: 0 % (ref 0.0–0.2)

## 2023-04-04 LAB — BASIC METABOLIC PANEL
Anion gap: 7 (ref 5–15)
BUN: 23 mg/dL (ref 8–23)
CO2: 21 mmol/L — ABNORMAL LOW (ref 22–32)
Calcium: 7.4 mg/dL — ABNORMAL LOW (ref 8.9–10.3)
Chloride: 109 mmol/L (ref 98–111)
Creatinine, Ser: 0.67 mg/dL (ref 0.61–1.24)
GFR, Estimated: >60 mL/min (ref >60)
Glucose, Bld: 90 mg/dL (ref 70–99)
Potassium: 4.4 mmol/L (ref 3.5–5.1)
Sodium: 137 mmol/L (ref 135–145)

## 2023-04-04 LAB — CK: Total CK: 66 U/L (ref 49–397)

## 2023-04-04 MED ORDER — ADULT MULTIVITAMIN W/MINERALS CH
1.0000 | ORAL_TABLET | Freq: Every day | ORAL | Status: DC
  Administered 2023-04-05 – 2023-04-08 (×4): 1 via ORAL
  Filled 2023-04-04 (×4): qty 1

## 2023-04-04 MED ORDER — DOXYCYCLINE HYCLATE 100 MG PO TABS
100.0000 mg | ORAL_TABLET | Freq: Two times a day (BID) | ORAL | Status: DC
  Administered 2023-04-04 – 2023-04-08 (×9): 100 mg via ORAL
  Filled 2023-04-04 (×10): qty 1

## 2023-04-04 MED ORDER — SODIUM CHLORIDE 0.9 % IV SOLN
2.0000 g | INTRAVENOUS | Status: DC
  Administered 2023-04-05 – 2023-04-07 (×4): 2 g via INTRAVENOUS
  Filled 2023-04-04 (×4): qty 20

## 2023-04-04 MED ORDER — ENSURE ENLIVE PO LIQD
237.0000 mL | Freq: Two times a day (BID) | ORAL | Status: DC
  Administered 2023-04-04 – 2023-04-08 (×5): 237 mL via ORAL

## 2023-04-04 MED ORDER — APIXABAN 2.5 MG PO TABS
2.5000 mg | ORAL_TABLET | Freq: Two times a day (BID) | ORAL | Status: DC
  Administered 2023-04-04 – 2023-04-08 (×9): 2.5 mg via ORAL
  Filled 2023-04-04 (×9): qty 1

## 2023-04-04 MED ORDER — MIDODRINE HCL 5 MG PO TABS
10.0000 mg | ORAL_TABLET | Freq: Three times a day (TID) | ORAL | Status: DC
Start: 2023-04-04 — End: 2023-04-08
  Administered 2023-04-04 – 2023-04-08 (×12): 10 mg via ORAL
  Filled 2023-04-04 (×12): qty 2

## 2023-04-04 NOTE — Progress Notes (Signed)
Heart Failure Navigator Progress Note  Assessed for Heart & Vascular TOC clinic readiness.  Patient does not meet criteria due to per MD note patient seen with [facility] cardiology. .   Navigator will sign off at this time.   Rhae Hammock, BSN, Scientist, clinical (histocompatibility and immunogenetics) Only

## 2023-04-04 NOTE — Progress Notes (Addendum)
Patient Name: Fernando Young Date of Encounter: 04/04/2023 [facility] Cardiologist: None [facility]  Interval Summary  .    Denies any chest pain or shortness of breath.  No significant complaints.  Vital Signs .    Vitals:   04/04/23 1600 04/04/23 2000 04/04/23 2018 04/04/23 2100  BP: 97/60 112/64  (!) 98/55  Pulse: 65 67  67  Resp: 18 20  15   Temp: 97.6 F (36.4 C)  97.9 F (36.6 C)   TempSrc: Oral  Axillary   SpO2: 98% 97% 98% 96%  Weight:        Intake/Output Summary (Last 24 hours) at 04/04/2023 2217 Last data filed at 04/04/2023 0911 Gross per 24 hour  Intake 211.97 ml  Output 600 ml  Net -388.03 ml      04/04/2023    5:00 AM 04/03/2023    4:58 AM 04/02/2023    9:00 PM  Last 3 Weights  Weight (lbs) 116 lb 2.9 oz 149 lb 14.6 oz 149 lb 14.6 oz  Weight (kg) 52.7 kg 68 kg 68 kg      Telemetry/ECG    Sinus rhythm 70s 80s, PVCs- Personally Reviewed  CV Studies    Echocardiogram 04/03/2023 1. Technically difficult study with reduced echo windows   2. No apical thrombus with Definity enhancing agent. Left ventricular  ejection fraction, by estimation, is 35 to 40%. The left ventricle has  moderately decreased function. The left ventricle demonstrates regional  wall motion abnormalities (see scoring  diagram/findings for description). Left ventricular diastolic parameters  are consistent with Grade I diastolic dysfunction (impaired relaxation).  There is severe hypokinesis of the left ventricular, mid-apical septal  wall, apical segment, anteroseptal  wall, inferior wall and anterior wall.   3. Right ventricular systolic function is mildly reduced. The right  ventricular size is normal. There is normal pulmonary artery systolic  pressure. The estimated right ventricular systolic pressure is 33.8 mmHg.   4. Left atrial size was moderately dilated.   5. The mitral valve is abnormal. Mild mitral valve regurgitation.   6. The tricuspid valve is abnormal.   7.  The aortic valve was not well visualized. Aortic valve regurgitation  is not visualized.   8. The inferior vena cava is normal in size with <50% respiratory  variability, suggesting right atrial pressure of 8 mmHg.   Comparison(s): No prior Echocardiogram.    Physical Exam .   GEN: Critically ill, cachectic Neck: Subtle distention with hepatojugular reflex Cardiac: RRR, no murmurs, rubs, or gallops.  Respiratory: Clear to auscultation bilaterally. GI: Soft, nontender, non-distended  MS: 2+ edema  Patient Profile    Fernando Young is a 82 y.o. male has hx of  chronic systolic heart failure, non-ischemic cardiomyopathy, paroxysmal A fib/flutter on Eliquis, multiple skin cancer, chronic lymphocytic leukemia, chronic anemia, type 2 DM, HTN, HLD, hypothyroidism  and admitted on 04/03/2023 for the evaluation of NSTEMI in the setting of septic shock secondary to pneumonia.  Assessment & Plan .     NSTEMI Septic shock secondary to pneumonia Initially presented to the ER for chest pain and abdominal pain with constitutional symptoms, chest x-ray revealing pneumonia.  EKG without any acute ST-T wave changes.  Troponins 7306 and downtrending.  With his history of CLL and overall frailty with multiple comorbid conditions he has elected for conservative approach and avoiding aggressive management/procedures. Continue aspirin, atorvastatin 80 mg, no beta-blocker (intolerant/hypotensive) He is high risk for bleeding so no heparin/therapeutic Lovenox with his  anemia and on Eliquis. Remains to be on Levophed drip Further treatment per critical care  Chronic HFrEF Echo shows no apical thrombus, EF 35 to 40% with regional wall motion abnormalities (severe hypokinesis of the left ventricular, mid-apical septal  wall, apical segment, anteroseptal wall, inferior wall and anterior wall).  Mildly reduced RV function.  Volume status is okay, has 2+ pitting edema however now with shortness of breath or  orthopnea.  Be judicious with IV fluids. GDMT limited by hypotension requiring Levophed.  Chronically requires midodrine.  Continue to hold home meds.  Will not be aggressive about his GDMT.  PTA on benazepril  Paroxysmal atrial fibrillation/flutter Maintaining normal sinus rhythm. Continue Eliquis 2.5 mg, amiodarone 200 mg daily.    Cachectic Limited DNR Anemia SCC Per primary team.   For questions or updates, please contact [facility] Please consult [email or web address] for contact info under        Signed, Abagail Kitchens, PA-C    ADDENDUM:   Patient seen and examined with Abagail Kitchens, PA-C  .  I personally taken a history, examined the patient, reviewed relevant notes,  laboratory data / imaging studies.  I performed a substantive portion of this encounter and formulated the important aspects of the plan.  I agree with the APP's note, impression, and recommendations; however, I have edited the note to reflect changes or salient points (which are noted in italics).   Patient seen and examined at bedside. Denies anginal chest pain or heart failure symptoms. No events overnight. No family at bedside  PHYSICAL EXAM: Today's Vitals   04/04/23 1600 04/04/23 2000 04/04/23 2018 04/04/23 2100  BP: 97/60 112/64  (!) 98/55  Pulse: 65 67  67  Resp: 18 20  15   Temp: 97.6 F (36.4 C)  97.9 F (36.6 C)   TempSrc: Oral  Axillary   SpO2: 98% 97% 98% 96%  Weight:      PainSc: 0-No pain      Body mass index is 16.67 kg/m.   Net IO Since Admission: 829.78 mL [04/04/23 2217]  Filed Weights   04/02/23 2100 04/03/23 0458 04/04/23 0500  Weight: 68 kg 68 kg 52.7 kg    General: Cachectic, appears older than stated age, hemodynamically stable. HEENT: Oral mucosa dry, poor dentition, trachea midline,  Heart: Regular, positive S1-S2, no murmurs rubs or gallops appreciated. Lungs: Clear to auscultation bilaterally in the upper lung fields, decreased breath sounds bilaterally at  the bases, poor inspiratory effort Abdomen: Soft, nontender, nondistended, positive bowel sounds in all 4 quadrants Extremities: Limited swelling up to the ankles Neuro: Alert oriented x 4, moves all 4 extremities, pleasant  EKG: (personally reviewed by me) No new EKGs.  Telemetry: (personally reviewed by me) Sinus rhythm with rare PVCs   Impression:  NSTEMI. Septic shock secondary to pneumonia Chronic HFrEF Cardiomyopathy Paroxysmal atrial fibrillation/flutter CLL Skin cancer Debility / Malnutrition / Cachectic  Recommendations:  Cardiology was consulted during this admission due to NSTEMI Prior to arrival to [facility] patient did endorse chest pain, EKG did not illustrate STEMI, high sensitive troponins peaked 7306, echocardiogram notes moderately reduced LVEF with regional wall motion abnormalities. In the past given his comorbidities including CLL patient has opted to proceed with medical management which is quite appropriate.  He continues to feel the same and does not want to proceed with invasive workup at this time.   Clinically he denies anginal chest pain. In the past he has not tolerated much antianginal therapy or  GDMT given his underlying cardiomyopathy.   All he is able to tolerate was benazepril as outpatient but currently on midodrine. Continue aspirin and statin therapy.  Paroxysmal atrial flutter/fibrillation: Not on AV nodal blocking agents. Continue home dose amiodarone. Continue Eliquis for thromboembolic prophylaxis, given his age and weight recommend Eliquis dosing to be 2.5 mg p.o. twice daily  Hyperlipidemia: Continue statin therapy.  Septic shock secondary to pneumonia: Currently on antibiotics.  Has been off of Levophed as of 04/03/2023  CLL: WBC 171.2K  No additional recommendations from a cardiology standpoint at this time. Patient is encouraged to follow-up with his primary cardiologist at [facility] status post discharge Please reach out if  any questions or concerns arise in the interim.  Further recommendations to follow as the case evolves.   This note was created using a voice recognition software as a result there may be grammatical errors inadvertently enclosed that do not reflect the nature of this encounter. Every attempt is made to correct such errors.   Tessa Lerner, DO, [facility]  [address] [place], [place] [address] Pager: [phone] Office: [phone] 04/04/2023 10:17 PM

## 2023-04-04 NOTE — Progress Notes (Addendum)
Speech Language Pathology Treatment: Dysphagia  Patient Details Name: Fernando Young MRN: [id number] DOB: 1941/04/03 Today's Date: 04/04/2023 Time: 1252-1307 SLP Time Calculation (min) (ACUTE ONLY): 15 min  Assessment / Plan / Recommendation Clinical Impression  Pt seen for dysphagia and states his swallowing seems better today. Hid dentures were cleaned and donned. They intermittently are ill fitting and fall from his hard palate however he was able to masticate a graham cracker timely. He did state that there was residue on the lower posterior left side that he attempted to remove with tongue and finger without success. He consumed sips thin water via straw across multiple trials without s/s aspiration consistently and stated it was "going down well.". The nurse and nurse tech notified re: need for denture cream and NT went to 4th floor and retrieved denture cream. Recommend he continue Dys 3, thin liquid, pills whole in puree. No further ST needed at this time. Please donn denture cream to upper dentures.    HPI HPI: Patient is an 82 y.o. male who presented on 12/01 to the ED with chest pressure, fever, chills, and sinus tachucardia. Previous admission 3 weeks ago with bowel obstruction, dischagred to rehab. 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 SLP swallow eval ordered 12/02.      SLP Plan  All goals met;Discharge SLP treatment due to (comment)      Recommendations for follow up therapy are one component of a multi-disciplinary discharge planning process, led by the attending physician.  Recommendations may be updated based on patient status, additional functional criteria and insurance authorization.    Recommendations  Diet recommendations: Dysphagia 3 (mechanical soft);Thin liquid Liquids provided via:  Straw;Cup Medication Administration: Whole meds with puree Supervision:  (may need assist with feeding due to dexterity?) Compensations: Slow rate;Small sips/bites Postural Changes and/or Swallow Maneuvers: Seated upright 90 degrees                  Oral care BID   Intermittent Supervision/Assistance       All goals met;Discharge SLP treatment due to (comment)     Royce Macadamia  04/04/2023, 1:16 PM

## 2023-04-04 NOTE — Progress Notes (Signed)
Initial Nutrition Assessment  DOCUMENTATION CODES:   Underweight  INTERVENTION:  - DYS 3 diet per SLP. - Add Ensure Plus High Protein po BID, each supplement provides 350 kcal and 20 grams of protein. - Add Magic cup TID with meals, each supplement provides 290 kcal and 9 grams of protein - Encourage intake at all meals and of supplements.  - Add Multivitamin with minerals daily - Monitor weight trends.   NUTRITION DIAGNOSIS:   Increased nutrient needs related to acute illness as evidenced by estimated needs.  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 Was recently admitted 3 weeks ago with bowel obstruction for 5 days and managed conservatively and discharged to rehab.   Attempted to speak with patient via bedside telephone but no answer x2.   Per chart review, patient's weight stable the past year until this admission. He was last weighed at 149# in October and then it appears the first two weights taken this admission were carried over from that time. Weight today has patient at 116# If weight in October and weight today are both accurate, this could indicate a 33# or 22% weight loss in ~3 months. This would be severe and significant for the time frame.  Will need to confirm weight trends once able to speak with patient.  There are no meal intakes documented since admission. Patient is being followed by SLP and recommended DYS 3 diet.   Will order Ensure and Magic Cup to support intake during admission.  Suspect  patient may have protein-calorie malnutrition, will need to obtain NFPE at next visit.    Medications reviewed and include: -  Labs reviewed:  -   NUTRITION - FOCUSED PHYSICAL EXAM:  RD working remotely  Diet Order:   Diet Order             DIET DYS 3 Room service appropriate? Yes with Assist; Fluid consistency: Thin  Diet effective now                   EDUCATION NEEDS:  Not appropriate for education at this time  Skin:  Skin Assessment: Skin Integrity Issues: Skin Integrity Issues:: Other (Comment) Other: Skin Tear on Coccyx  Last BM:  12/3 - type 6  Height:  Ht Readings from Last 1 Encounters:  02/10/23 5\' 10"  (1.778 m)   Weight:  Wt Readings from Last 1 Encounters:  04/04/23 52.7 kg    BMI:  Body mass index is 16.67 kg/m.  Estimated Nutritional Needs:  Kcal:  1850-2000 kcals Protein:  90-105 grams Fluid:  >/= 1.8L    Shelle Iron RD, LDN For contact information, refer to [facility].

## 2023-04-04 NOTE — Progress Notes (Signed)
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 Has mild abd pain, nausea and vomiting x1, and loose BM x1. Fever 38.8 C in ED  with chills, sinus tachycardia (140) and hypotensive (MAP 47 mmHg). Denied SOB, cough, wheezing, URT symptoms, palpitations, dizziness, LL edema, abd distension, melena, blood per rectum, rash, or urinary symptoms.  Quit smoking 1985 (smoked 20 py). No alcohol or illicit drug use. Was admitted 3 weeks ago with bowel obstruction fro 5 days and managed conservatively and discharged to rehab.  He is DNR/DNI and confirmed by family (see transfer notes).   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  LA 2.3, down to 1.2 after IVF bolus UA and resp PCR panel: negative PCXR: RML/RML infiltrates and LLL atelectasis. He has a port cath on the right side.  Repeat PCXR: small pleural effusions with basilar infiltration or atelectasis, similar to prior study. VBG: 7.35/27/48 Trop 0.295 Pro-BNP: 633 WBC 189.3, L 87.6% Hb 10.3 Other labs, PCXR and EKG were reviewed   Interim History / Subjective:  No events. Feels weak. No chest  pain. BP improved but no pressor titration.  Objective   Blood pressure 105/61, pulse 65, temperature 97.6 F (36.4 C), temperature source Oral, resp. rate 17, weight 52.7 kg, SpO2 98%.        Intake/Output Summary (Last 24 hours) at 04/04/2023 0751 Last data filed at 04/04/2023 0435 Gross per 24 hour  Intake 1541.51 ml  Output 920 ml  Net 621.51 ml   Filed Weights   04/02/23 2100 04/03/23 0458 04/04/23 0500  Weight: 68 kg 68 kg 52.7 kg    Examination: Frail elderly man in NAD +temporal wasting Ext warm Port accessed Lungs sound okay to me, no accessory muscle use +muscle wasting Moves to command Garbled speech due to lack of teeth  WBC improved BMP okay  Resolved Hospital Problem list   Lactic acidosis  Assessment & Plan:  Septic shock due to PNA, UTI MRSA swab +; urine strep neg 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%), LAFB, RBBB Afib (on Eliquis), HTN and dyslipidemia HypoMg DM-2- I think this is cured due to starvation, would not even keep him on metformin RLS Hypothyroidism- TSH ok GERD Anemia of chronic illness CLL Facial BCC on Vismodegib and facial Sq.C.Ca Severe protein calorie malnutrition POA Deconditioning and muscle wasting Dysphagia- rec'd for dysphagia diet, appreciate SLP inpiut  - continue midodrine - wean off levophed - lovenox to eliquis 2.5 BID (age, weight) - GDMT per cardiology, limited by baseline low BP - Abx to doxy+ceftriaxone for duration  listed -  Home meds as ordered - PT/OT eval - Okay for stepdown status, appreciate [facility] taking over starting 04/05/23; remaining issue is PT/OT/RD eval, any further cards recs, BP stability  Best Practice (right click and "Reselect all SmartList Selections" daily)   Diet/type: dysphagia diet DVT prophylaxis: eliquis Pressure ulcer(s): not present on admission  GI  prophylaxis: N/A Lines: N/A Foley:  N/A Code Status:  DNR Last date of multidisciplinary goals of care discussion [pending]  38 min cc time Myrla Halsted MD PCCM

## 2023-04-04 NOTE — Plan of Care (Signed)
  Problem: Education: Goal: Knowledge of General Education information will improve Description: Including pain rating scale, medication(s)/side effects and non-pharmacologic comfort measures Outcome: Progressing   Problem: Health Behavior/Discharge Planning: Goal: Ability to manage health-related needs will improve Outcome: Progressing   Problem: Clinical Measurements: Goal: Diagnostic test results will improve Outcome: Progressing Goal: Respiratory complications will improve Outcome: Progressing Goal: Cardiovascular complication will be avoided Outcome: Progressing   Problem: Activity: Goal: Risk for activity intolerance will decrease Outcome: Progressing   Problem: Nutrition: Goal: Adequate nutrition will be maintained Outcome: Progressing   Problem: Coping: Goal: Level of anxiety will decrease Outcome: Progressing   Problem: Elimination: Goal: Will not experience complications related to bowel motility Outcome: Progressing Goal: Will not experience complications related to urinary retention Outcome: Progressing   Problem: Pain Management: Goal: General experience of comfort will improve Outcome: Progressing   Problem: Safety: Goal: Ability to remain free from injury will improve Outcome: Progressing   Problem: Education: Goal: Ability to describe self-care measures that may prevent or decrease complications (Diabetes Survival Skills Education) will improve Outcome: Progressing Goal: Individualized Educational Video(s) Outcome: Progressing   Problem: Coping: Goal: Ability to adjust to condition or change in health will improve Outcome: Progressing   Problem: Fluid Volume: Goal: Ability to maintain a balanced intake and output will improve Outcome: Progressing   Problem: Health Behavior/Discharge Planning: Goal: Ability to identify and utilize available resources and services will improve Outcome: Progressing Goal: Ability to manage health-related needs  will improve Outcome: Progressing   Problem: Metabolic: Goal: Ability to maintain appropriate glucose levels will improve Outcome: Progressing   Problem: Nutritional: Goal: Maintenance of adequate nutrition will improve Outcome: Progressing Goal: Progress toward achieving an optimal weight will improve Outcome: Progressing   Problem: Tissue Perfusion: Goal: Adequacy of tissue perfusion will improve Outcome: Progressing

## 2023-04-04 NOTE — Evaluation (Signed)
Occupational Therapy Evaluation Patient Details Name: Fernando Young MRN: [id number] DOB: 03/17/1941 Today's Date: 04/04/2023   History of Present Illness patient is a 82 year old male who presented [facility] ED  with chest pressure,mild abd pain, nausea and vomiting , and loose BM, Fever , sinus tachycardia (140) and hypotensive, Chest xray shows small pleural effusions with basilar infiltration or atelectasis, sepsis.   PMH: CHF, CLL,PAF,GIB,DM, patient report Cerebral Pa;sy with Left extremities affected   Clinical Impression   Patient is a 82 year old male who was admitted for above. Patient was living at home with 24/7 support from family. Patient was noted to have decreased functional activity tolerance, decreased endurance, decreased standing balance, decreased safety awareness, and decreased knowledge of AD/AE impacting participation in ADLs. Patient plans tto d/c home with family support and [facility] services. Patient would continue to benefit from skilled OT services at this time while admitted and after d/c to address noted deficits in order to improve overall safety and independence in ADLs.        If plan is discharge home, recommend the following: A little help with walking and/or transfers;A little help with bathing/dressing/bathroom;Assistance with cooking/housework;Direct supervision/assist for medications management;Assist for transportation;Help with stairs or ramp for entrance;Direct supervision/assist for financial management    Functional Status Assessment  Patient has had a recent decline in their functional status and demonstrates the ability to make significant improvements in function in a reasonable and predictable amount of time.  Equipment Recommendations  None recommended by OT       Precautions / Restrictions Precautions Precautions: Fall Restrictions Weight Bearing Restrictions: No      Mobility Bed Mobility Overal bed mobility: Needs Assistance Bed Mobility: Supine  to Sit     Supine to sit: Contact guard     General bed mobility comments: use of rail to push self upright          Balance Overall balance assessment: Needs assistance Sitting-balance support: No upper extremity supported, Feet supported Sitting balance-Leahy Scale: Fair     Standing balance support: During functional activity, Bilateral upper extremity supported, Reliant on assistive device for balance Standing balance-Leahy Scale: Fair         ADL either performed or assessed with clinical judgement   ADL Overall ADL's : Needs assistance/impaired Eating/Feeding: Supervision/ safety;Set up Eating/Feeding Details (indicate cue type and reason): provided with red foam handles to increase ability to hold onto plastic utensils.nurse made aware. patient demonstrated better handle on holding utensils with red foam in place. Grooming: Therapist, nutritional;Set up;Sitting Grooming Details (indicate cue type and reason): with increased time Upper Body Bathing: Sitting;Minimal assistance   Lower Body Bathing: Moderate assistance;Sitting/lateral leans   Upper Body Dressing : Sitting;Minimal assistance   Lower Body Dressing: Sitting/lateral leans;Moderate assistance   Toilet Transfer: Minimal assistance;+2 for physical assistance;+2 for safety/equipment;Rolling walker (2 wheels);Stand-pivot Statistician Details (indicate cue type and reason): with increased time. Toileting- Clothing Manipulation and Hygiene: Maximal assistance;Sit to/from stand               Vision Patient Visual Report: No change from baseline              Pertinent Vitals/Pain Pain Assessment Pain Assessment: No/denies pain     Extremity/Trunk Assessment Upper Extremity Assessment Upper Extremity Assessment: RUE deficits/detail;LUE deficits/detail RUE Deficits / Details: noted to have ataxic movements with BUE with active engagement. patient repoted this is his baseline. RUE Coordination: decreased  fine motor LUE Coordination: decreased fine motor  Lower Extremity Assessment Lower Extremity Assessment: Defer to PT evaluation LLE Deficits / Details: noted shorter in length, smaller circummference, full active movent hip and knee   Cervical / Trunk Assessment Cervical / Trunk Assessment: Kyphotic   Communication Communication Communication: No apparent difficulties   Cognition Arousal: Alert Behavior During Therapy: WFL for tasks assessed/performed Overall Cognitive Status: Within Functional Limits for tasks assessed         General Comments: off by 1 day for orientation.                Home Living Family/patient expects to be discharged to:: Private residence Living Arrangements: Children Available Help at Discharge: Available 24 hours/day Type of Home: House Home Access: Stairs to enter [employer] of Steps: 2 Entrance Stairs-Rails: Can reach both Home Layout: One level     Bathroom Shower/Tub: Sponge bathes at baseline         Home Equipment: Agricultural consultant (2 wheels);Cane - single point   Additional Comments: son stays at night, daughter daytime      Prior Functioning/Environment Prior Level of Function : Independent/Modified Independent;Driving             Mobility Comments: reports uses RW, drives very little ADLs Comments: independnetly sponge bathes, dresses, gets cereal, goes out to dinner        OT Problem List: Decreased activity tolerance;Impaired balance (sitting and/or standing);Decreased coordination;Decreased safety awareness;Decreased knowledge of precautions;Decreased knowledge of use of DME or AE      OT Treatment/Interventions: Self-care/ADL training;Therapeutic exercise;DME and/or AE instruction;Patient/family education;Therapeutic activities;Balance training    OT Goals(Current goals can be found in the care plan section) Acute Rehab OT Goals Patient Stated Goal: to go home OT Goal Formulation: With  patient Time For Goal Achievement: 04/18/23 Potential to Achieve Goals: Fair  OT Frequency: Min 1X/week    Co-evaluation PT/OT/SLP Co-Evaluation/Treatment: Yes Reason for Co-Treatment: For patient/therapist safety PT goals addressed during session: Mobility/safety with mobility OT goals addressed during session: ADL's and self-care      AM-PAC OT "6 Clicks" Daily Activity     Outcome Measure Help from another person eating meals?: A Little Help from another person taking care of personal grooming?: A Little Help from another person toileting, which includes using toliet, bedpan, or urinal?: A Lot Help from another person bathing (including washing, rinsing, drying)?: A Lot Help from another person to put on and taking off regular upper body clothing?: A Little Help from another person to put on and taking off regular lower body clothing?: A Lot 6 Click Score: 15   End of Session Equipment Utilized During Treatment: Gait belt;Rolling walker (2 wheels) Nurse Communication: Mobility status  Activity Tolerance: Patient tolerated treatment well Patient left: in chair;with call bell/phone within reach;with chair alarm set  OT Visit Diagnosis: Unsteadiness on feet (R26.81);Other abnormalities of gait and mobility (R26.89);Muscle weakness (generalized) (M62.81)                Time: 1140-1200 OT Time Calculation (min): 20 min Charges:  OT General Charges $OT Visit: 1 Visit OT Evaluation $OT Eval Moderate Complexity: 1 Mod  Edmar Blankenburg OTR/L, MS Acute Rehabilitation Department Office# [phone]   Selinda Flavin 04/04/2023, 1:21 PM

## 2023-04-04 NOTE — Evaluation (Signed)
Physical Therapy Evaluation Patient Details Name: Fernando Young MRN: [id number] DOB: 05-29-1940 Today's Date: 04/04/2023  History of Present Illness  82 yo male Transfer  to [facility] ED  with chest pressure,mild abd pain, nausea and vomiting , and loose BM, Fever , sinus tachycardia (140) and hypotensive, Chest xray shows small pleural effusions with basilar infiltration or atelectasis, sepsis.   PMH: CHF, CLL,PAF,GIB,DM, patient report Cerebral Pa;sy with Left extremities affected  Clinical Impression  Pt admitted with above diagnosis.  Pt currently with functional limitations due to the deficits listed below (see PT Problem List). Pt will benefit from acute skilled PT to increase their independence and safety with mobility to allow discharge.    The patient is motivated to mobilize. Patient required min assistance to  stand and step to recliner using RW.  Patient reports son and daughter stay with him, reports independent in ambulation with Rollator  or cane. Patient  Should progress to return home if 24/7 caregivers are available. SPO2 on RA >90%, HR 80's/         If plan is discharge home, recommend the following: A little help with walking and/or transfers;A little help with bathing/dressing/bathroom;Help with stairs or ramp for entrance;Assist for transportation;Assistance with cooking/housework   Can travel by private vehicle        Equipment Recommendations None recommended by PT  Recommendations for Other Services       Functional Status Assessment Patient has had a recent decline in their functional status and demonstrates the ability to make significant improvements in function in a reasonable and predictable amount of time.     Precautions / Restrictions Precautions Precautions: Fall      Mobility  Bed Mobility Overal bed mobility: Needs Assistance Bed Mobility: Supine to Sit     Supine to sit: Contact guard     General bed mobility comments: use of rail to push  self upright    Transfers Overall transfer level: Needs assistance Equipment used: Rolling walker (2 wheels) Transfers: Sit to/from Stand, Bed to chair/wheelchair/BSC Sit to Stand: Min assist   Step pivot transfers: Min assist       General transfer comment: patient able to stand and step to recliner using RW    Ambulation/Gait                  Stairs            Wheelchair Mobility     Tilt Bed    Modified Rankin (Stroke Patients Only)       Balance Overall balance assessment: Needs assistance Sitting-balance support: No upper extremity supported, Feet supported Sitting balance-Leahy Scale: Fair     Standing balance support: During functional activity, Bilateral upper extremity supported, Reliant on assistive device for balance Standing balance-Leahy Scale: Fair                               Pertinent Vitals/Pain Pain Assessment Pain Assessment: No/denies pain    Home Living Family/patient expects to be discharged to:: Private residence Living Arrangements: Children Available Help at Discharge: Available 24 hours/day Type of Home: House Home Access: Stairs to enter Entrance Stairs-Rails: Can reach both     Home Layout: One level Home Equipment: Agricultural consultant (2 wheels);Cane - single point Additional Comments: son stays at night, daughter daytime    Prior Function Prior Level of Function : Independent/Modified Independent;Driving  Mobility Comments: reports uses RW, drives very little ADLs Comments: independnetly sponge bathes, dresses, gets cereal, goes out to dinner     Extremity/Trunk Assessment        Lower Extremity Assessment Lower Extremity Assessment: LLE deficits/detail LLE Deficits / Details: noted shorter in length, smaller circummference, full active movent hip and knee    Cervical / Trunk Assessment Cervical / Trunk Assessment: Kyphotic  Communication   Communication Communication: No  apparent difficulties  Cognition Arousal: Alert Behavior During Therapy: WFL for tasks assessed/performed Overall Cognitive Status: Within Functional Limits for tasks assessed                                 General Comments: off by 1 day        General Comments      Exercises     Assessment/Plan    PT Assessment Patient needs continued PT services  PT Problem List Decreased strength;Decreased mobility;Decreased safety awareness;Decreased knowledge of precautions;Decreased activity tolerance;Decreased balance;Decreased knowledge of use of DME       PT Treatment Interventions DME instruction;Therapeutic activities;Gait training;Therapeutic exercise;Patient/family education;Functional mobility training    PT Goals (Current goals can be found in the Care Plan section)  Acute Rehab PT Goals Patient Stated Goal: go home PT Goal Formulation: With patient Time For Goal Achievement: 04/18/23 Potential to Achieve Goals: Good    Frequency Min 1X/week     Co-evaluation PT/OT/SLP Co-Evaluation/Treatment: Yes Reason for Co-Treatment: For patient/therapist safety PT goals addressed during session: Mobility/safety with mobility OT goals addressed during session: ADL's and self-care       AM-PAC PT "6 Clicks" Mobility  Outcome Measure Help needed turning from your back to your side while in a flat bed without using bedrails?: None Help needed moving from lying on your back to sitting on the side of a flat bed without using bedrails?: A Little Help needed moving to and from a bed to a chair (including a wheelchair)?: A Little Help needed standing up from a chair using your arms (e.g., wheelchair or bedside chair)?: A Little Help needed to walk in hospital room?: A Lot Help needed climbing 3-5 steps with a railing? : Total 6 Click Score: 16    End of Session Equipment Utilized During Treatment: Gait belt Activity Tolerance: Patient tolerated treatment well Patient  left: in chair;with call bell/phone within reach;with chair alarm set Nurse Communication: Mobility status PT Visit Diagnosis: Unsteadiness on feet (R26.81);Difficulty in walking, not elsewhere classified (R26.2)    Time: 1140-1200 PT Time Calculation (min) (ACUTE ONLY): 20 min   Charges:   PT Evaluation $PT Eval Low Complexity: 1 Low   PT General Charges $$ ACUTE PT VISIT: 1 Visit         Blanchard Kelch PT Acute Rehabilitation Services Office [phone] Weekend [phone]   Rada Hay 04/04/2023, 12:49 PM

## 2023-04-05 ENCOUNTER — Other Ambulatory Visit: Payer: Self-pay

## 2023-04-05 DIAGNOSIS — A419 Sepsis, unspecified organism: Secondary | ICD-10-CM | POA: Diagnosis not present

## 2023-04-05 DIAGNOSIS — E119 Type 2 diabetes mellitus without complications: Secondary | ICD-10-CM | POA: Diagnosis not present

## 2023-04-05 DIAGNOSIS — R6521 Severe sepsis with septic shock: Secondary | ICD-10-CM | POA: Diagnosis not present

## 2023-04-05 DIAGNOSIS — I4892 Unspecified atrial flutter: Secondary | ICD-10-CM

## 2023-04-05 DIAGNOSIS — I4891 Unspecified atrial fibrillation: Secondary | ICD-10-CM | POA: Diagnosis not present

## 2023-04-05 LAB — BASIC METABOLIC PANEL
Anion gap: 5 (ref 5–15)
BUN: 21 mg/dL (ref 8–23)
CO2: 23 mmol/L (ref 22–32)
Calcium: 7.8 mg/dL — ABNORMAL LOW (ref 8.9–10.3)
Chloride: 109 mmol/L (ref 98–111)
Creatinine, Ser: 0.77 mg/dL (ref 0.61–1.24)
GFR, Estimated: >60 mL/min (ref >60)
Glucose, Bld: 108 mg/dL — ABNORMAL HIGH (ref 70–99)
Potassium: 4.7 mmol/L (ref 3.5–5.1)
Sodium: 137 mmol/L (ref 135–145)

## 2023-04-05 LAB — CBC
HCT: 28.5 % — ABNORMAL LOW (ref 39.0–52.0)
Hemoglobin: 8.1 g/dL — ABNORMAL LOW (ref 13.0–17.0)
MCH: 28.1 pg (ref 26.0–34.0)
MCHC: 28.4 g/dL — ABNORMAL LOW (ref 30.0–36.0)
MCV: 99 fL (ref 80.0–100.0)
Platelets: 245 10*3/uL (ref 150–400)
RBC: 2.88 MIL/uL — ABNORMAL LOW (ref 4.22–5.81)
RDW: 16.8 % — ABNORMAL HIGH (ref 11.5–15.5)
WBC: 117.6 10*3/uL (ref 4.0–10.5)
nRBC: 0 % (ref 0.0–0.2)

## 2023-04-05 LAB — GLUCOSE, CAPILLARY
Glucose-Capillary: 107 mg/dL — ABNORMAL HIGH (ref 70–99)
Glucose-Capillary: 113 mg/dL — ABNORMAL HIGH (ref 70–99)
Glucose-Capillary: 116 mg/dL — ABNORMAL HIGH (ref 70–99)
Glucose-Capillary: 124 mg/dL — ABNORMAL HIGH (ref 70–99)
Glucose-Capillary: 127 mg/dL — ABNORMAL HIGH (ref 70–99)
Glucose-Capillary: 141 mg/dL — ABNORMAL HIGH (ref 70–99)
Glucose-Capillary: 147 mg/dL — ABNORMAL HIGH (ref 70–99)

## 2023-04-05 LAB — LEGIONELLA PNEUMOPHILA SEROGP 1 UR AG: L. pneumophila Serogp 1 Ur Ag: NEGATIVE

## 2023-04-05 MED ORDER — IPRATROPIUM-ALBUTEROL 0.5-2.5 (3) MG/3ML IN SOLN: 3.0000 mL | Freq: Four times a day (QID) | RESPIRATORY_TRACT | Status: DC | PRN

## 2023-04-05 NOTE — Progress Notes (Signed)
[facility] PROGRESS NOTE    Progress Note  Fernando Young  WGN:[id number] DOB: Sep 28, 1940 DOA: 04/02/2023 PCP: [name], MD     Brief Narrative:   Fernando Young is an 82 y.o. male past medical history of chronic systolic heart failure, chronic atrial fibrillation on Eliquis, diabetes mellitus type 2, anemia of chronic disease, CLL, facial squamous cell carcinoma comes into the ED with abdominal pain nausea and vomiting fever tachycardia hypotensive, was found to be septic of unclear source started on pressors initially now has been off transferred to [facility] on 04/05/2023     Assessment/Plan:   Septic shock (HCC) of unclear source: MRSA swab was positive.  Initially started on Levophed, antibiotics statin beta-blocker and aspirin were held. He was started also on aggressive IV fluids. Weaned off Levophed placed on midodrine. Currently on Doxy and Rocephin for 7 days.  Elevated troponins/NSTEMI: Twelve-lead EKG showed no significant changes, just some ST segment abnormalities. Troponin as high as 7000 trending down. Likely in the setting of demand ischemia. Cardiology was consulted with a history of CLL and fragility patient elected conservative management.  No plan for cath due to comorbidities, discussed with patient. Was started on aspirin and statin. Beta-blockers were not started due to hypertension. He is at high risk of bleeding so anticoagulation was held.  Chronic systolic heart failure: 2D echo showed no apical thrombus, EF of 35%.  Severe hypokinesia with mildly reduced RV function. GDMT is limited due to hypotension. Chronically he requires midodrine.  Paroxysmal atrial fibrillation/flutter: Continue Eliquis and amiodarone.  Hypomagnesemia: Repleted now improved.  Diabetes mellitus type 2, noninsulin dependent (HCC) A1c of 5.6 he probably will not need metformin. There is likely due to significant weight loss.  Thyroidism: Continue  Synthroid.  Anemia of chronic renal disease/CLL: Hemoglobin is relatively stable seems to be at baseline.  Facial basal cell carcinoma: Vismodegib and facial Sq.C.Ca   Severe protein caloric malnutrition: Noted.  Dyslipidemia Continue statins.  DVT prophylaxis: Eliquis Family Communication:none Status is: Inpatient Remains inpatient appropriate because: Hypotension    Code Status:     Code Status Orders  (From admission, onward)           Start     Ordered   04/02/23 2053  Do not attempt resuscitation (DNR)- Limited -Do Not Intubate (DNI)  Continuous       Question Answer Comment  If pulseless and not breathing No CPR or chest compressions.   In Pre-Arrest Conditions (Patient Is Breathing and Has A Pulse) Do not intubate. Provide all appropriate non-invasive medical interventions. Avoid ICU transfer unless indicated or required.   Consent: Discussion documented in EHR or advanced directives reviewed      04/02/23 2053           Code Status History     Date Active Date Inactive Code Status Order ID Comments User Context   02/17/2022 2355 02/25/2022 2321 DNR [id number]  Synetta Fail, MD Inpatient   02/17/2022 2259 02/17/2022 2355 Full Code [id number]  Synetta Fail, MD Inpatient   07/03/2021 2329 07/05/2021 1804 DNR [id number]  Charlsie Quest, MD Inpatient         IV Access:   Peripheral IV   Procedures and diagnostic studies:   ECHOCARDIOGRAM COMPLETE  Result Date: 04/03/2023    ECHOCARDIOGRAM REPORT   Patient Name:   Fernando Young Date of Exam: 04/03/2023 Medical Rec #:  [id number]      Height:  70.0 in Accession #:    [id number]     Weight:       149.9 lb Date of Birth:  December 04, 1940      BSA:          1.847 m Patient Age:    82 years       BP:           114/72 mmHg Patient Gender: M              HR:           81 bpm. Exam Location:  Inpatient Procedure: 2D Echo, Color Doppler, Cardiac Doppler and Intracardiac            Opacification Agent  Indications:    Elevated Troponin  History:        Patient has no prior history of Echocardiogram examinations.                 CHF; Risk Factors:Hypertension, Diabetes and Dyslipidemia.  Sonographer:    Harriette Bouillon RDCS Referring Phys: [id number] OMAR M ALBUSTAMI  Sonographer Comments: Technically difficult study due to poor echo windows. IMPRESSIONS  1. Technically difficult study with reduced echo windows  2. No apical thrombus with Definity enhancing agent. Left ventricular ejection fraction, by estimation, is 35 to 40%. The left ventricle has moderately decreased function. The left ventricle demonstrates regional wall motion abnormalities (see scoring diagram/findings for description). Left ventricular diastolic parameters are consistent with Grade I diastolic dysfunction (impaired relaxation). There is severe hypokinesis of the left ventricular, mid-apical septal wall, apical segment, anteroseptal wall, inferior wall and anterior wall.  3. Right ventricular systolic function is mildly reduced. The right ventricular size is normal. There is normal pulmonary artery systolic pressure. The estimated right ventricular systolic pressure is 33.8 mmHg.  4. Left atrial size was moderately dilated.  5. The mitral valve is abnormal. Mild mitral valve regurgitation.  6. The tricuspid valve is abnormal.  7. The aortic valve was not well visualized. Aortic valve regurgitation is not visualized.  8. The inferior vena cava is normal in size with <50% respiratory variability, suggesting right atrial pressure of 8 mmHg. Comparison(s): No prior Echocardiogram. FINDINGS  Left Ventricle: No apical thrombus with Definity enhancing agent. Left ventricular ejection fraction, by estimation, is 35 to 40%. The left ventricle has moderately decreased function. The left ventricle demonstrates regional wall motion abnormalities. Severe hypokinesis of the left ventricular, mid-apical septal wall, apical segment, anteroseptal wall, inferior  wall and anterior wall. Definity contrast agent was given IV to delineate the left ventricular endocardial borders. The left ventricular internal cavity size was normal in size. There is no left ventricular hypertrophy. Left ventricular diastolic parameters are consistent with Grade I diastolic dysfunction (impaired relaxation). Indeterminate filling pressures.  LV Wall Scoring: The mid and distal anterior septum and entire apex are hypokinetic. Right Ventricle: The right ventricular size is normal. No increase in right ventricular wall thickness. Right ventricular systolic function is mildly reduced. There is normal pulmonary artery systolic pressure. The tricuspid regurgitant velocity is 2.54 m/s, and with an assumed right atrial pressure of 8 mmHg, the estimated right ventricular systolic pressure is 33.8 mmHg. Left Atrium: Left atrial size was moderately dilated. Right Atrium: Right atrial size was normal in size. Pericardium: Trivial pericardial effusion is present. The pericardial effusion is localized near the right ventricle. Mitral Valve: The mitral valve is abnormal. Mild mitral valve regurgitation. Tricuspid Valve: The tricuspid valve is abnormal. Tricuspid valve regurgitation is mild. Aortic  Valve: The aortic valve was not well visualized. Aortic valve regurgitation is not visualized. Pulmonic Valve: The pulmonic valve was not well visualized. Pulmonic valve regurgitation is not visualized. Aorta: The aortic root and ascending aorta are structurally normal, with no evidence of dilitation. Venous: The inferior vena cava is normal in size with less than 50% respiratory variability, suggesting right atrial pressure of 8 mmHg. IAS/Shunts: No atrial level shunt detected by color flow Doppler.  LEFT VENTRICLE PLAX 2D LVIDd:         5.70 cm      Diastology LVIDs:         4.70 cm      LV e' medial:    4.90 cm/s LV PW:         0.80 cm      LV E/e' medial:  9.8 LV IVS:        0.70 cm      LV e' lateral:   8.16  cm/s LVOT diam:     2.20 cm      LV E/e' lateral: 5.9 LV SV:         69 LV SV Index:   37 LVOT Area:     3.80 cm  LV Volumes (MOD) LV vol d, MOD A2C: 113.2 ml LV vol d, MOD A4C: 138.9 ml LV vol s, MOD A2C: 61.9 ml LV vol s, MOD A4C: 88.4 ml LV SV MOD A2C:     51.3 ml LV SV MOD A4C:     138.9 ml LV SV MOD BP:      51.6 ml RIGHT VENTRICLE            IVC RV S prime:     8.38 cm/s  IVC diam: 1.80 cm TAPSE (M-mode): 2.3 cm LEFT ATRIUM           Index        RIGHT ATRIUM           Index LA diam:      3.90 cm 2.11 cm/m   RA Area:     15.90 cm LA Vol (A2C): 38.8 ml 21.01 ml/m  RA Volume:   42.70 ml  23.12 ml/m LA Vol (A4C): 70.2 ml 38.01 ml/m  AORTIC VALVE LVOT Vmax:   99.20 cm/s LVOT Vmean:  68.300 cm/s LVOT VTI:    0.182 m  AORTA Ao Root diam: 3.20 cm MITRAL VALVE               TRICUSPID VALVE MV Area (PHT): 2.55 cm    TR Peak grad:   25.8 mmHg MV Decel Time: 298 msec    TR Vmax:        254.00 cm/s MV E velocity: 48.00 cm/s MV A velocity: 84.80 cm/s  SHUNTS MV E/A ratio:  0.57        Systemic VTI:  0.18 m                            Systemic Diam: 2.20 cm Zoila Shutter MD Electronically signed by Zoila Shutter MD Signature Date/Time: 04/03/2023/11:27:35 AM    Final      Medical Consultants:   None.   Subjective:    [name] no complaints he relates his abdominal pain is better.  Objective:    Vitals:   04/05/23 0400 04/05/23 0404 04/05/23 0500 04/05/23 0600  BP: 104/60  102/67 122/67  Pulse: 60  63 68  Resp: 20  (!)  25 (!) 21  Temp:  (!) 97.5 F (36.4 C)    TempSrc:  Oral    SpO2: 97%  97% 99%  Weight:   55 kg    SpO2: 99 % O2 Flow Rate (L/min): (S) 2 L/min (placed on RA- RN aware)   Intake/Output Summary (Last 24 hours) at 04/05/2023 0654 Last data filed at 04/05/2023 0200 Gross per 24 hour  Intake 135.17 ml  Output 550 ml  Net -414.83 ml   Filed Weights   04/03/23 0458 04/04/23 0500 04/05/23 0500  Weight: 68 kg 52.7 kg 55 kg    Exam: General exam: In no acute  distress, cachectic Respiratory system: Good air movement and clear to auscultation. Cardiovascular system: S1 & S2 heard, RRR. No JVD. Gastrointestinal system: Abdomen is nondistended, soft and nontender.  Extremities: No pedal edema. Skin: No rashes, lesions or ulcers Psychiatry: Judgment and insight appear intact.   Data Reviewed:    Labs: Basic Metabolic Panel: Recent Labs  Lab 04/02/23 2100 04/02/23 2129 04/03/23 0544 04/04/23 0437 04/05/23 0513  NA 138  --  137 137 137  K 4.5  --  3.8 4.4 4.7  CL 109  --  109 109 109  CO2 20*  --  20* 21* 23  GLUCOSE 151*  --  96 90 108*  BUN 32*  --  28* 23 21  CREATININE 1.03  --  0.84 0.67 0.77  CALCIUM 8.0*  --  7.9* 7.4* 7.8*  MG  --   --  2.0  --   --   PHOS  --  3.9  --   --   --    GFR Estimated Creatinine Clearance: 53.5 mL/min (by C-G formula based on SCr of 0.77 mg/dL). Liver Function Tests: Recent Labs  Lab 04/02/23 2100  AST 51*  ALT 19  ALKPHOS 64  BILITOT 0.5  PROT 5.4*  ALBUMIN 2.8*   Recent Labs  Lab 04/02/23 2129  LIPASE 19   No results for input(s): "AMMONIA" in the last 168 hours. Coagulation profile Recent Labs  Lab 04/03/23 0544  INR 2.1*   COVID-19 Labs  No results for input(s): "DDIMER", "FERRITIN", "LDH", "CRP" in the last 72 hours.  No results found for: "SARSCOV2NAA"  CBC: Recent Labs  Lab 04/02/23 2100 04/03/23 0544 04/04/23 0437 04/05/23 0513  WBC 176.4* 190.1* 171.2* 117.6*  NEUTROABS 38.8*  --   --   --   HGB 9.1* 8.8* 8.4* 8.1*  HCT 31.9* 32.3* 30.0* 28.5*  MCV 100.6* 101.6* 98.0 99.0  PLT 300 276 292 245   Cardiac Enzymes: Recent Labs  Lab 04/04/23 0436  CKTOTAL 66   BNP (last 3 results) No results for input(s): "PROBNP" in the last 8760 hours. CBG: Recent Labs  Lab 04/04/23 1114 04/04/23 1612 04/04/23 2027 04/05/23 0035 04/05/23 0347  GLUCAP 105* 99 125* 127* 113*   D-Dimer: No results for input(s): "DDIMER" in the last 72 hours. Hgb A1c: Recent  Labs    04/02/23 2129  HGBA1C 5.6   Lipid Profile: Recent Labs    04/02/23 2129  CHOL 71  HDL 31*  LDLCALC 29  TRIG 54  CHOLHDL 2.3   Thyroid function studies: No results for input(s): "TSH", "T4TOTAL", "T3FREE", "THYROIDAB" in the last 72 hours.  Invalid input(s): "FREET3" Anemia work up: No results for input(s): "VITAMINB12", "FOLATE", "FERRITIN", "TIBC", "IRON", "RETICCTPCT" in the last 72 hours. Sepsis Labs: Recent Labs  Lab 04/02/23 2100 04/02/23 2129 04/02/23 2350 04/03/23 0544 04/04/23 8295  04/05/23 0513  PROCALCITON  --  4.73  --  4.93  --   --   WBC 176.4*  --   --  190.1* 171.2* 117.6*  LATICACIDVEN 1.0  --  0.9  --   --   --    Microbiology Recent Results (from the past 240 hour(s))  Culture, blood (Routine X 2) w Reflex to ID Panel     Status: None (Preliminary result)   Collection Time: 04/02/23  9:00 PM   Specimen: BLOOD RIGHT ARM  Result Value Ref Range Status   Specimen Description BLOOD RIGHT ARM  Final   Special Requests   Final    BOTTLES DRAWN AEROBIC ONLY Blood Culture adequate volume   Culture   Final    NO GROWTH 1 DAY Performed at [facility], [address] [place], [place] [address]    Report Status PENDING  Incomplete  Culture, blood (Routine X 2) w Reflex to ID Panel     Status: None (Preliminary result)   Collection Time: 04/02/23  9:00 PM   Specimen: BLOOD RIGHT ARM  Result Value Ref Range Status   Specimen Description BLOOD RIGHT ARM  Final   Special Requests   Final    BOTTLES DRAWN AEROBIC ONLY Blood Culture adequate volume   Culture   Final    NO GROWTH 1 DAY Performed at [facility], [address] [place], [place] [address]    Report Status PENDING  Incomplete  MRSA Next Gen by PCR, Nasal     Status: Abnormal   Collection Time: 04/02/23  9:42 PM   Specimen: Nasal Mucosa; Nasal Swab  Result Value Ref Range Status   MRSA by PCR Next Gen DETECTED (A) NOT DETECTED Final    Comment: CRITICAL RESULT CALLED  TO, READ BACK BY AND VERIFIED WITH: BYRD A. @ 0248 04/03/23 MCLEAN K. (NOTE) The GeneXpert MRSA Assay (FDA approved for NASAL specimens only), is one component of a comprehensive MRSA colonization surveillance program. It is not intended to diagnose MRSA infection nor to guide or monitor treatment for MRSA infections. Test performance is not FDA approved in patients less than 41 years old. Performed at [facility], [address] [place], [place] [address]      Medications:    amiodarone  200 mg Oral Daily   apixaban  2.5 mg Oral BID   aspirin EC  81 mg Oral Daily   atorvastatin  80 mg Oral Daily   Chlorhexidine Gluconate Cloth  6 each Topical Daily   doxycycline  100 mg Oral Q12H   feeding supplement  237 mL Oral BID BM   ipratropium-albuterol  3 mL Nebulization BID   levothyroxine  75 mcg Oral Q0600   midodrine  10 mg Oral TID WC   multivitamin with minerals  1 tablet Oral Daily   rOPINIRole  0.5-1 mg Oral QHS   Continuous Infusions:  cefTRIAXone (ROCEPHIN)  IV Stopped (04/05/23 0153)      LOS: 3 days   Marinda Elk  [facility]  04/05/2023, 6:54 AM

## 2023-04-05 NOTE — Plan of Care (Signed)

## 2023-04-06 DIAGNOSIS — I5022 Chronic systolic (congestive) heart failure: Secondary | ICD-10-CM | POA: Diagnosis not present

## 2023-04-06 DIAGNOSIS — R6521 Severe sepsis with septic shock: Secondary | ICD-10-CM | POA: Diagnosis not present

## 2023-04-06 DIAGNOSIS — Z515 Encounter for palliative care: Secondary | ICD-10-CM

## 2023-04-06 DIAGNOSIS — Z7189 Other specified counseling: Secondary | ICD-10-CM

## 2023-04-06 DIAGNOSIS — I4891 Unspecified atrial fibrillation: Secondary | ICD-10-CM | POA: Diagnosis not present

## 2023-04-06 DIAGNOSIS — R4589 Other symptoms and signs involving emotional state: Secondary | ICD-10-CM

## 2023-04-06 DIAGNOSIS — A419 Sepsis, unspecified organism: Secondary | ICD-10-CM | POA: Diagnosis not present

## 2023-04-06 DIAGNOSIS — I214 Non-ST elevation (NSTEMI) myocardial infarction: Secondary | ICD-10-CM | POA: Diagnosis not present

## 2023-04-06 DIAGNOSIS — Z66 Do not resuscitate: Secondary | ICD-10-CM

## 2023-04-06 DIAGNOSIS — E119 Type 2 diabetes mellitus without complications: Secondary | ICD-10-CM | POA: Diagnosis not present

## 2023-04-06 LAB — BASIC METABOLIC PANEL
Anion gap: 6 (ref 5–15)
BUN: 18 mg/dL (ref 8–23)
CO2: 23 mmol/L (ref 22–32)
Calcium: 7.9 mg/dL — ABNORMAL LOW (ref 8.9–10.3)
Chloride: 110 mmol/L (ref 98–111)
Creatinine, Ser: 0.75 mg/dL (ref 0.61–1.24)
GFR, Estimated: >60 mL/min (ref >60)
Glucose, Bld: 103 mg/dL — ABNORMAL HIGH (ref 70–99)
Potassium: 4.1 mmol/L (ref 3.5–5.1)
Sodium: 139 mmol/L (ref 135–145)

## 2023-04-06 LAB — CBC
HCT: 27.4 % — ABNORMAL LOW (ref 39.0–52.0)
Hemoglobin: 8.1 g/dL — ABNORMAL LOW (ref 13.0–17.0)
MCH: 28.8 pg (ref 26.0–34.0)
MCHC: 29.6 g/dL — ABNORMAL LOW (ref 30.0–36.0)
MCV: 97.5 fL (ref 80.0–100.0)
Platelets: 228 10*3/uL (ref 150–400)
RBC: 2.81 MIL/uL — ABNORMAL LOW (ref 4.22–5.81)
RDW: 16.6 % — ABNORMAL HIGH (ref 11.5–15.5)
WBC: 116.9 10*3/uL (ref 4.0–10.5)
nRBC: 0 % (ref 0.0–0.2)

## 2023-04-06 MED ORDER — ORAL CARE MOUTH RINSE: 15.0000 mL | OROMUCOSAL | Status: DC | PRN

## 2023-04-06 MED ORDER — ORAL CARE MOUTH RINSE
15.0000 mL | OROMUCOSAL | Status: DC
  Administered 2023-04-06 – 2023-04-08 (×7): 15 mL via OROMUCOSAL

## 2023-04-06 NOTE — Progress Notes (Signed)
Physical Therapy Treatment Patient Details Name: Fernando Young MRN: [id number] DOB: 1941/02/08 Today's Date: 04/06/2023   History of Present Illness patient is a 82 year old male who presented [facility] ED  with chest pressure,mild abd pain, nausea and vomiting , and loose BM, Fever , sinus tachycardia (140) and hypotensive, Chest xray shows small pleural effusions with basilar infiltration or atelectasis, sepsis.   PMH: CHF, CLL,PAF,GIB,DM, patient report Cerebral Pa;sy with Left extremities affected    PT Comments  Pt was seen in bed today. Pt stated that he is ready to go home. Pt performed supine to sit with no assist from therapist, pt used rails to self-assist. Pt sat on EOB for 3 minutes mod independently, no abnormal vitals or pt complaints. Pt performed sit to stand with RW from elevated bed and mod assist + 2. Pt then ambulated with RW and min assist + 2 for 45 ft. Pt ambulated with flexed posture and decreased step length. Pt required assist to "steer" walker. Gait distance was limited due to fatigue and increased respirations, lowest O2 was 93 on RA and highest HR was 83.  Pt was positioned in chair and wheeled back to room. Pt will benefit from continued PT to increase strength, endurance, and overall independence. LPT recommends HHPT.   If plan is discharge home, recommend the following: A little help with walking and/or transfers;A little help with bathing/dressing/bathroom;Help with stairs or ramp for entrance;Assist for transportation;Assistance with cooking/housework   Can travel by private vehicle        Equipment Recommendations  None recommended by PT    Recommendations for Other Services       Precautions / Restrictions Precautions Precautions: Fall Restrictions Weight Bearing Restrictions: No     Mobility  Bed Mobility Overal bed mobility: Modified Independent Bed Mobility: Supine to Sit     Supine to sit: Used rails, Supervision     General bed mobility  comments: Did not require assist from therapist, used rail to assist, increased time    Transfers Overall transfer level: Needs assistance Equipment used: Rolling walker (2 wheels) Transfers: Sit to/from Stand, Bed to chair/wheelchair/BSC Sit to Stand: Mod assist, +2 physical assistance, +2 safety/equipment, From elevated surface           General transfer comment: Pt performed sit to stand from elevated surface with + 2 assist, flexed posture    Ambulation/Gait Ambulation/Gait assistance: Min assist Gait Distance (Feet): 45 Feet Assistive device: Rolling walker (2 wheels) Gait Pattern/deviations: Step-through pattern, Decreased step length - right, Decreased step length - left, Trunk flexed, Shuffle, Narrow base of support Gait velocity: decreased     General Gait Details: Flexed posture, required assist to "steer" walker, distance limited due to increased fatigue and respirations.   Stairs             Wheelchair Mobility     Tilt Bed    Modified Rankin (Stroke Patients Only)       Balance                                            Cognition Arousal: Alert Behavior During Therapy: WFL for tasks assessed/performed Overall Cognitive Status: Within Functional Limits for tasks assessed  General Comments: A&O x 3, becomes agitated at times, wants to go home.        Exercises      General Comments        Pertinent Vitals/Pain Pain Assessment Pain Assessment: No/denies pain    Home Living                          Prior Function            PT Goals (current goals can now be found in the care plan section) Acute Rehab PT Goals Patient Stated Goal: go home Time For Goal Achievement: 04/18/23 Progress towards PT goals: Progressing toward goals    Frequency    Min 1X/week      PT Plan      Co-evaluation              AM-PAC PT "6 Clicks" Mobility    Outcome Measure  Help needed turning from your back to your side while in a flat bed without using bedrails?: None Help needed moving from lying on your back to sitting on the side of a flat bed without using bedrails?: A Little Help needed moving to and from a bed to a chair (including a wheelchair)?: A Little Help needed standing up from a chair using your arms (e.g., wheelchair or bedside chair)?: A Little Help needed to walk in hospital room?: A Lot Help needed climbing 3-5 steps with a railing? : Total 6 Click Score: 16    End of Session Equipment Utilized During Treatment: Gait belt Activity Tolerance: Patient tolerated treatment well Patient left: in chair;with call bell/phone within reach;with chair alarm set Nurse Communication: Mobility status PT Visit Diagnosis: Unsteadiness on feet (R26.81);Difficulty in walking, not elsewhere classified (R26.2)     Time: 4332-9518 PT Time Calculation (min) (ACUTE ONLY): 32 min  Charges:    $Gait Training: 8-22 mins $Therapeutic Activity: 8-22 mins PT General Charges $$ ACUTE PT VISIT: 1 Visit                    Lazaro Arms, SPTA 04/06/2023, 12:01 PM

## 2023-04-06 NOTE — Consult Note (Signed)
Consultation Note Date: 04/06/2023   Patient Name: Fernando Young  DOB: [birth year]  MRN: [id number]  Age / Sex: 82 y.o., male   PCP: [name], MD Referring Physician: David Stall, Darin Engels, MD  Reason for Consultation: Establishing goals of care     Chief Complaint/History of Present Illness:   Patient is an 82 year old male with a past medical history of HFpEF, chronic atrial fibrillation on Eliquis, diabetes mellitus type 2, anemia of chronic disease, CLL, and facial squamous cell carcinoma who was admitted on 04/02/2023 for management of abdominal pain, nausea, vomiting, and fever.  Patient was found to be hypotensive and admitted for management of sepsis secondary to unclear source.  Patient received pressor support and antibiotic management initially in the ICU though pressor support has now been weaned to midodrine.  Patient also noted to have NSTEMI likely in setting of demand ischemia and cardiology was consulted; conservative management was recommended.  Palliative medicine team consulted to assist with complex medical decision making.  Extensive review of EMR prior to presenting to bedside.  Presented to bedside to meet with patient.  Patient laying comfortably in bed.  No family present at bedside.  Introduced myself and the role of the palliative medicine team and patient's care.  Spent time discussing with patient his medical journey up to this point.  Patient described how he is feeling much better at this time things to medical interventions.  Patient is hopeful to get out of the bed and work with physical therapy to regain his strength.  Patient notes that he was living at home and his son lived with him.  Patient describes that he was participating in his own care around the house including ADLs and most IADLs.  Patient is eager to return to that point and maintain his independence as long as he is able.  Patient feels that when he has gone to rehab previously, this has not  benefited him well.  Patient would prefer to go home with physical therapy as he was supposed to be starting home health through [facility] therapy this week when he ended up back in the hospital.  Acknowledged patient's wishes and noted would inform physical therapy of this so they can determine appropriate referral level for therapy.  Patient voiced appreciation for this.  Discussed with patient care moving forward.  Patient still feels that coming back to the hospital and seeking medical interventions benefits his overall quality of life at this point.  Patient agrees that his daughter, Rosey Bath, is still his HCPOA as per his documents and EMR.  Spent time providing emotional support reactive listening.  All questions answered at that time. 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   CHF (congestive heart failure) (HCC)    Diabetes mellitus without complication (HCC)    Diarrhea 01/21/2023   History of prostate cancer    Hyperlipidemia    Hypertension    Hypoglycemia 05/06/2021   Iron deficiency anemia 02/20/2020   Leucocytosis    Leukemia, lymphocytic, chronic (HCC)    Social History   Socioeconomic History   Marital status: Divorced    Spouse name: Not on file   Number of children: 2   Years of education: Not on file   Highest education level: Not on file  Occupational History  Not on file  Tobacco Use   Smoking status: Former   Smokeless tobacco: Never  Substance and Sexual Activity   Alcohol use: Not Currently   Drug use: Never   Sexual activity: Not Currently  Other Topics Concern   Not on file  Social History Narrative   Not on file   Social Determinants of Health   Financial Resource Strain: Low Risk  (04/18/2022)   Received [facility], [facility]   Overall  Financial Resource Strain (CARDIA)    Difficulty of Paying Living Expenses: Not hard at all  Food Insecurity: No Food Insecurity (04/03/2023)   Hunger Vital Sign    Worried About Running Out of Food in the Last Year: Never true    Ran Out of Food in the Last Year: Never true  Transportation Needs: No Transportation Needs (04/03/2023)   PRAPARE - Administrator, Civil Service (Medical): No    Lack of Transportation (Non-Medical): No  Physical Activity: Sufficiently Active (04/18/2022)   Received from [facility], [facility]   Exercise Vital Sign    Days of Exercise per Week: 7 days    Minutes of Exercise per Session: 150+ min  Stress: No Stress Concern Present (04/18/2022)   Received from [facility], [facility] - Occupational Stress Questionnaire    Feeling of Stress : Not at all  Social Connections: Moderately Isolated (04/18/2022)   Received from [facility], [facility]   Social Connection and Isolation Panel [NHANES]    Frequency of Communication with Friends and Family: More than three times a week    Frequency of Social Gatherings with Friends and Family: More than three times a week    Attends Religious Services: Never    Database administrator or Organizations: Yes    Attends Banker Meetings: Never    Marital Status: Divorced   Family History  Problem Relation Age of Onset   Ovarian cancer Mother 58   Cancer Sister 70   Scheduled Meds:  amiodarone  200 mg Oral Daily   apixaban  2.5 mg Oral BID   aspirin EC  81 mg Oral Daily   atorvastatin  80 mg Oral Daily   Chlorhexidine Gluconate Cloth  6 each Topical Daily   doxycycline  100 mg Oral Q12H   feeding supplement  237 mL Oral BID BM   levothyroxine  75 mcg Oral Q0600   midodrine  10 mg Oral TID WC   multivitamin with minerals  1 tablet Oral Daily   rOPINIRole  0.5-1 mg Oral QHS   Continuous Infusions:  cefTRIAXone  (ROCEPHIN)  IV Stopped (04/06/23 0113)   PRN Meds:.acetaminophen, bismuth subsalicylate, docusate sodium, ipratropium-albuterol, ondansetron (ZOFRAN) IV, mouth rinse, polyethylene glycol, sodium chloride flush, zolpidem No Known Allergies CBC:    Component Value Date/Time   WBC 116.9 (HH) 04/06/2023 0541   HGB 8.1 (L) 04/06/2023 0541   HCT 27.4 (L) 04/06/2023 0541   PLT 228 04/06/2023 0541   MCV 97.5 04/06/2023 0541   MCV 92 10/05/2022 0000   NEUTROABS 38.8 (H) 04/02/2023 2100   NEUTROABS 11.16 02/03/2023 1512   LYMPHSABS 125.2 (H) 04/02/2023 2100   MONOABS 5.3 (H) 04/02/2023 2100   EOSABS 0.0 04/02/2023 2100   BASOSABS 0.0 04/02/2023 2100   Comprehensive Metabolic Panel:    Component Value Date/Time   NA 139 04/06/2023 0541   NA 134 (A)  02/03/2023 1512   K 4.1 04/06/2023 0541   CL 110 04/06/2023 0541   CO2 23 04/06/2023 0541   BUN 18 04/06/2023 0541   BUN 14 02/03/2023 1512   CREATININE 0.75 04/06/2023 0541   CREATININE 0.93 10/05/2022 1446   GLUCOSE 103 (H) 04/06/2023 0541   CALCIUM 7.9 (L) 04/06/2023 0541   AST 51 (H) 04/02/2023 2100   AST 21 10/05/2022 1446   ALT 19 04/02/2023 2100   ALT 20 10/05/2022 1446   ALKPHOS 64 04/02/2023 2100   BILITOT 0.5 04/02/2023 2100   BILITOT 0.4 10/05/2022 1446   PROT 5.4 (L) 04/02/2023 2100   PROT 5.7 (A) 02/10/2022 0000   ALBUMIN 2.8 (L) 04/02/2023 2100    Physical Exam: Vital Signs: BP (!) 137/94   Pulse 68   Temp 97.8 F (36.6 C) (Oral)   Resp 19   Ht 6' (1.829 m)   Wt 53.8 kg   SpO2 96%   BMI 16.09 kg/m  SpO2: SpO2: 96 % O2 Device: O2 Device: Room Air O2 Flow Rate: O2 Flow Rate (L/min): (S) 2 L/min (placed on RA- RN aware) Intake/output summary:  Intake/Output Summary (Last 24 hours) at 04/06/2023 7425 Last data filed at 04/06/2023 9563 Gross per 24 hour  Intake 100 ml  Output 900 ml  Net -800 ml   LBM: Last BM Date : 04/05/23 Baseline Weight: Weight: 68 kg Most recent weight: Weight: 53.8 kg  General:  NAD, alert, elderly appearing, frail Cardiovascular: RRR Respiratory: no increased work of breathing noted, not in respiratory distress Neuro: A&Ox4, following commands easily Psych: appropriately answers all questions          Palliative Performance Scale: 40%              Additional Data Reviewed: Recent Labs    04/05/23 0513 04/06/23 0541  WBC 117.6* 116.9*  HGB 8.1* 8.1*  PLT 245 228  NA 137 139  BUN 21 18  CREATININE 0.77 0.75    Imaging: ECHOCARDIOGRAM COMPLETE    ECHOCARDIOGRAM REPORT       Patient Name:   MERLIN WHEATON Date of Exam: 04/03/2023 Medical Rec #:  [id number]      Height:       70.0 in Accession #:    [id number]     Weight:       149.9 lb Date of Birth:  Nov 01, 1940      BSA:          1.847 m Patient Age:    82 years       BP:           114/72 mmHg Patient Gender: M              HR:           81 bpm. Exam Location:  Inpatient  Procedure: 2D Echo, Color Doppler, Cardiac Doppler and Intracardiac            Opacification Agent  Indications:    Elevated Troponin   History:        Patient has no prior history of Echocardiogram examinations.                 CHF; Risk Factors:Hypertension, Diabetes and Dyslipidemia.   Sonographer:    Harriette Bouillon RDCS Referring Phys: [id number] OMAR M ALBUSTAMI    Sonographer Comments: Technically difficult study due to poor echo windows. IMPRESSIONS   1. Technically difficult study with reduced echo windows  2. No apical thrombus  with Definity enhancing agent. Left ventricular ejection fraction, by estimation, is 35 to 40%. The left ventricle has moderately decreased function. The left ventricle demonstrates regional wall motion abnormalities (see scoring  diagram/findings for description). Left ventricular diastolic parameters are consistent with Grade I diastolic dysfunction (impaired relaxation). There is severe hypokinesis of the left ventricular, mid-apical septal wall, apical segment, anteroseptal  wall, inferior  wall and anterior wall.  3. Right ventricular systolic function is mildly reduced. The right ventricular size is normal. There is normal pulmonary artery systolic pressure. The estimated right ventricular systolic pressure is 33.8 mmHg.  4. Left atrial size was moderately dilated.  5. The mitral valve is abnormal. Mild mitral valve regurgitation.  6. The tricuspid valve is abnormal.  7. The aortic valve was not well visualized. Aortic valve regurgitation is not visualized.  8. The inferior vena cava is normal in size with <50% respiratory variability, suggesting right atrial pressure of 8 mmHg.  Comparison(s): No prior Echocardiogram.  FINDINGS  Left Ventricle: No apical thrombus with Definity enhancing agent. Left ventricular ejection fraction, by estimation, is 35 to 40%. The left ventricle has moderately decreased function. The left ventricle demonstrates regional wall motion abnormalities.  Severe hypokinesis of the left ventricular, mid-apical septal wall, apical segment, anteroseptal wall, inferior wall and anterior wall. Definity contrast agent was given IV to delineate the left ventricular endocardial borders. The left ventricular  internal cavity size was normal in size. There is no left ventricular hypertrophy. Left ventricular diastolic parameters are consistent with Grade I diastolic dysfunction (impaired relaxation). Indeterminate filling pressures.    LV Wall Scoring: The mid and distal anterior septum and entire apex are hypokinetic.  Right Ventricle: The right ventricular size is normal. No increase in right ventricular wall thickness. Right ventricular systolic function is mildly reduced. There is normal pulmonary artery systolic pressure. The tricuspid regurgitant velocity is 2.54  m/s, and with an assumed right atrial pressure of 8 mmHg, the estimated right ventricular systolic pressure is 33.8 mmHg.  Left Atrium: Left atrial size was moderately dilated.  Right Atrium:  Right atrial size was normal in size.  Pericardium: Trivial pericardial effusion is present. The pericardial effusion is localized near the right ventricle.  Mitral Valve: The mitral valve is abnormal. Mild mitral valve regurgitation.  Tricuspid Valve: The tricuspid valve is abnormal. Tricuspid valve regurgitation is mild.  Aortic Valve: The aortic valve was not well visualized. Aortic valve regurgitation is not visualized.  Pulmonic Valve: The pulmonic valve was not well visualized. Pulmonic valve regurgitation is not visualized.  Aorta: The aortic root and ascending aorta are structurally normal, with no evidence of dilitation.  Venous: The inferior vena cava is normal in size with less than 50% respiratory variability, suggesting right atrial pressure of 8 mmHg.  IAS/Shunts: No atrial level shunt detected by color flow Doppler.    LEFT VENTRICLE PLAX 2D LVIDd:         5.70 cm      Diastology LVIDs:         4.70 cm      LV e' medial:    4.90 cm/s LV PW:         0.80 cm      LV E/e' medial:  9.8 LV IVS:        0.70 cm      LV e' lateral:   8.16 cm/s LVOT diam:     2.20 cm      LV E/e' lateral: 5.9 LV SV:  69 LV SV Index:   37 LVOT Area:     3.80 cm   LV Volumes (MOD) LV vol d, MOD A2C: 113.2 ml LV vol d, MOD A4C: 138.9 ml LV vol s, MOD A2C: 61.9 ml LV vol s, MOD A4C: 88.4 ml LV SV MOD A2C:     51.3 ml LV SV MOD A4C:     138.9 ml LV SV MOD BP:      51.6 ml  RIGHT VENTRICLE            IVC RV S prime:     8.38 cm/s  IVC diam: 1.80 cm TAPSE (M-mode): 2.3 cm  LEFT ATRIUM           Index        RIGHT ATRIUM           Index LA diam:      3.90 cm 2.11 cm/m   RA Area:     15.90 cm LA Vol (A2C): 38.8 ml 21.01 ml/m  RA Volume:   42.70 ml  23.12 ml/m LA Vol (A4C): 70.2 ml 38.01 ml/m  AORTIC VALVE LVOT Vmax:   99.20 cm/s LVOT Vmean:  68.300 cm/s LVOT VTI:    0.182 m   AORTA Ao Root diam: 3.20 cm  MITRAL VALVE               TRICUSPID VALVE MV Area (PHT): 2.55  cm    TR Peak grad:   25.8 mmHg MV Decel Time: 298 msec    TR Vmax:        254.00 cm/s MV E velocity: 48.00 cm/s MV A velocity: 84.80 cm/s  SHUNTS MV E/A ratio:  0.57        Systemic VTI:  0.18 m                            Systemic Diam: 2.20 cm  Zoila Shutter MD Electronically signed by Zoila Shutter MD Signature Date/Time: 04/03/2023/11:27:35 AM      Final      I personally reviewed recent imaging.   Palliative Care Assessment and Plan Summary of Established Goals of Care and Medical Treatment Preferences   Patient is an 82 year old male with a past medical history of HFpEF, chronic atrial fibrillation on Eliquis, diabetes mellitus type 2, anemia of chronic disease, CLL, and facial squamous cell carcinoma who was admitted on 04/02/2023 for management of abdominal pain, nausea, vomiting, and fever.  Patient was found to be hypotensive and admitted for management of sepsis secondary to unclear source.  Patient received pressor support and antibiotic management initially in the ICU though pressor support has now been weaned to midodrine.  Patient also noted to have NSTEMI likely in setting of demand ischemia and cardiology was consulted; conservative management was recommended.  Palliative medicine team consulted to assist with complex medical decision making.  # Complex medical decision making/goals of care  -Discussed care with patient as detailed above in HPI.  Patient's goal is to maintain his independence as long as possible.  Patient notes that he was living at home and his son lived with him.  Patient notes he was able to participate in most ADLs and IADLs at home.  Patient notes that he does not feel rehab has benefited him in the past and so would like to determine if going home with home health would be appropriate for him.  Noted would inform physical therapy of this for  appropriate evaluation.  -Patient feels that returning to the hospital and seeking further medical interventions  does provide him quality of life at this time.  -Patient confirmed that his daughter, Rosey Bath , would be his HCPOA as per his documentation and EMR.  -  Code Status: Limited: Do not attempt resuscitation (DNR) -DNR-LIMITED -Do Not Intubate/DNI    # Symptom management  -As per hospitalist  # Psycho-social/Spiritual Support:  - Support System: Daughter/HCPOA-Teresa, son-Russell  # Discharge Planning:  Skilled Nursing Facility for rehab with Palliative care service follow-up -Consider referral to home palliative medicine upon discharge if appropriate.  Thank you for allowing the palliative care team to participate in the care [name].  Alvester Morin, DO Palliative Care Provider PMT # [phone]  If patient remains symptomatic despite maximum doses, please call PMT at [phone] between 0700 and 1900. Outside of these hours, please call attending, as PMT does not have night coverage.  Personally spent 80 minutes in patient care including extensive chart review (labs, imaging, progress/consult notes, vital signs), medically appropraite exam, discussed with treatment team, education to patient, family, and staff, documenting clinical information, medication review and management, coordination of care, and available advanced directive documents.   *Please note that this is a verbal dictation therefore any spelling or grammatical errors are due to the "[employer] One" system interpretation.

## 2023-04-06 NOTE — Progress Notes (Signed)
[facility] PROGRESS NOTE    Progress Note  Fernando Young  PPI:[id number] DOB: [birth year] DOA: 04/02/2023 PCP: [name], MD     Brief Narrative:   Fernando Young is an 82 y.o. male past medical history of chronic systolic heart failure, chronic atrial fibrillation on Eliquis, diabetes mellitus type 2, anemia of chronic disease, CLL, facial squamous cell carcinoma comes into the ED with abdominal pain nausea and vomiting fever tachycardia hypotensive, was found to be septic of unclear source started on pressors initially now has been off transferred to [facility] on 04/05/2023   Assessment/Plan:   Septic shock (HCC) of unclear source: MRSA swab was positive.  Initially started on Levophed, antibiotics statin beta-blocker and aspirin were held. He was started also on aggressive IV fluids. Weaned off Levophed placed on midodrine. Currently on Doxy and Rocephin for 7 days. PT OT evaluated the patient will need home health PT. Out of bed to chair.  Elevated troponins/NSTEMI: Twelve-lead EKG showed no significant changes, just some ST segment abnormalities. Troponin as high as 7000 trending down. Likely in the setting of demand ischemia. Cardiology was consulted with a history of CLL and fragility patient elected conservative management.  No plan for cath due to comorbidities, discussed with patient. Was started on aspirin and statin. Beta-blockers were not started due to hypotension and heart rate in the 60s. Transfer to MedSurg.  Chronic systolic heart failure: 2D echo showed no apical thrombus, EF of 35%.  Severe hypokinesia with mildly reduced RV function. GDMT is limited due to hypotension. Chronically he requires midodrine.  Paroxysmal atrial fibrillation/flutter: Continue Eliquis and amiodarone.  Hypomagnesemia: Repleted now improved.  Diabetes mellitus type 2, noninsulin dependent (HCC) A1c of 5.6 he probably will not need metformin. There is likely due  to significant weight loss.  Thyroidism: Continue Synthroid.  Anemia of chronic renal disease/CLL: Hemoglobin is relatively stable seems to be at baseline.  Facial basal cell carcinoma: Vismodegib and facial Sq.C.Ca   Severe protein caloric malnutrition: Noted.  Dyslipidemia Continue statins.  DVT prophylaxis: Eliquis Family Communication:none Status is: Inpatient Remains inpatient appropriate because: Hypotension    Code Status:     Code Status Orders  (From admission, onward)           Start     Ordered   04/02/23 2053  Do not attempt resuscitation (DNR)- Limited -Do Not Intubate (DNI)  Continuous       Question Answer Comment  If pulseless and not breathing No CPR or chest compressions.   In Pre-Arrest Conditions (Patient Is Breathing and Has A Pulse) Do not intubate. Provide all appropriate non-invasive medical interventions. Avoid ICU transfer unless indicated or required.   Consent: Discussion documented in EHR or advanced directives reviewed      04/02/23 2053           Code Status History     Date Active Date Inactive Code Status Order ID Comments User Context   02/17/2022 2355 02/25/2022 2321 DNR [id number]  Synetta Fail, MD Inpatient   02/17/2022 2259 02/17/2022 2355 Full Code [id number]  Synetta Fail, MD Inpatient   07/03/2021 2329 07/05/2021 1804 DNR [id number]  Charlsie Quest, MD Inpatient         IV Access:   Peripheral IV   Procedures and diagnostic studies:   No results found.   Medical Consultants:   None.   Subjective:    Fernando Young abdominal pain is resolved has no new complaints tolerating his  diet.  Objective:    Vitals:   04/06/23 0300 04/06/23 0400 04/06/23 0500 04/06/23 0600  BP: 126/69 109/62 104/61 103/62  Pulse: 64 (!) 59 60 (!) 59  Resp: 17 17 14  (!) 39  Temp:  97.8 F (36.6 C)    TempSrc:  Oral    SpO2: 95% 95% 93% 95%  Weight:   53.8 kg   Height:       SpO2: 95 % O2 Flow Rate  (L/min): (S) 2 L/min (placed on RA- RN aware) FiO2 (%): 21 %   Intake/Output Summary (Last 24 hours) at 04/06/2023 0655 Last data filed at 04/06/2023 0630 Gross per 24 hour  Intake 100 ml  Output 500 ml  Net -400 ml   Filed Weights   04/04/23 0500 04/05/23 0500 04/06/23 0500  Weight: 52.7 kg 55 kg 53.8 kg    Exam: General exam: In no acute distress. Respiratory system: Good air movement and clear to auscultation. Cardiovascular system: S1 & S2 heard, RRR. No JVD. Gastrointestinal system: Abdomen is nondistended, soft and nontender.  Extremities: No pedal edema. Skin: No rashes, lesions or ulcers Psychiatry: Judgement and insight appear normal. Mood & affect appropriate.   Data Reviewed:    Labs: Basic Metabolic Panel: Recent Labs  Lab 04/02/23 2100 04/02/23 2129 04/03/23 0544 04/04/23 0437 04/05/23 0513 04/06/23 0541  NA 138  --  137 137 137 139  K 4.5  --  3.8 4.4 4.7 4.1  CL 109  --  109 109 109 110  CO2 20*  --  20* 21* 23 23  GLUCOSE 151*  --  96 90 108* 103*  BUN 32*  --  28* 23 21 18   CREATININE 1.03  --  0.84 0.67 0.77 0.75  CALCIUM 8.0*  --  7.9* 7.4* 7.8* 7.9*  MG  --   --  2.0  --   --   --   PHOS  --  3.9  --   --   --   --    GFR Estimated Creatinine Clearance: 52.3 mL/min (by C-G formula based on SCr of 0.75 mg/dL). Liver Function Tests: Recent Labs  Lab 04/02/23 2100  AST 51*  ALT 19  ALKPHOS 64  BILITOT 0.5  PROT 5.4*  ALBUMIN 2.8*   Recent Labs  Lab 04/02/23 2129  LIPASE 19   No results for input(s): "AMMONIA" in the last 168 hours. Coagulation profile Recent Labs  Lab 04/03/23 0544  INR 2.1*   COVID-19 Labs  No results for input(s): "DDIMER", "FERRITIN", "LDH", "CRP" in the last 72 hours.  No results found for: "SARSCOV2NAA"  CBC: Recent Labs  Lab 04/02/23 2100 04/03/23 0544 04/04/23 0437 04/05/23 0513 04/06/23 0541  WBC 176.4* 190.1* 171.2* 117.6* 116.9*  NEUTROABS 38.8*  --   --   --   --   HGB 9.1* 8.8* 8.4*  8.1* 8.1*  HCT 31.9* 32.3* 30.0* 28.5* 27.4*  MCV 100.6* 101.6* 98.0 99.0 97.5  PLT 300 276 292 245 228   Cardiac Enzymes: Recent Labs  Lab 04/04/23 0436  CKTOTAL 66   BNP (last 3 results) No results for input(s): "PROBNP" in the last 8760 hours. CBG: Recent Labs  Lab 04/05/23 0738 04/05/23 1256 04/05/23 1543 04/05/23 1947 04/05/23 2334  GLUCAP 107* 116* 141* 147* 124*   D-Dimer: No results for input(s): "DDIMER" in the last 72 hours. Hgb A1c: No results for input(s): "HGBA1C" in the last 72 hours.  Lipid Profile: No results for input(s): "CHOL", "HDL", "  LDLCALC", "TRIG", "CHOLHDL", "LDLDIRECT" in the last 72 hours.  Thyroid function studies: No results for input(s): "TSH", "T4TOTAL", "T3FREE", "THYROIDAB" in the last 72 hours.  Invalid input(s): "FREET3" Anemia work up: No results for input(s): "VITAMINB12", "FOLATE", "FERRITIN", "TIBC", "IRON", "RETICCTPCT" in the last 72 hours. Sepsis Labs: Recent Labs  Lab 04/02/23 2100 04/02/23 2129 04/02/23 2350 04/03/23 0544 04/04/23 0437 04/05/23 0513 04/06/23 0541  PROCALCITON  --  4.73  --  4.93  --   --   --   WBC 176.4*  --   --  190.1* 171.2* 117.6* 116.9*  LATICACIDVEN 1.0  --  0.9  --   --   --   --    Microbiology Recent Results (from the past 240 hour(s))  Culture, blood (Routine X 2) w Reflex to ID Panel     Status: None (Preliminary result)   Collection Time: 04/02/23  9:00 PM   Specimen: BLOOD RIGHT ARM  Result Value Ref Range Status   Specimen Description BLOOD RIGHT ARM  Final   Special Requests   Final    BOTTLES DRAWN AEROBIC ONLY Blood Culture adequate volume   Culture   Final    NO GROWTH 2 DAYS Performed at [facility], [address], [place], [place] [address]    Report Status PENDING  Incomplete  Culture, blood (Routine X 2) w Reflex to ID Panel     Status: None (Preliminary result)   Collection Time: 04/02/23  9:00 PM   Specimen: BLOOD RIGHT ARM  Result Value Ref Range Status    Specimen Description BLOOD RIGHT ARM  Final   Special Requests   Final    BOTTLES DRAWN AEROBIC ONLY Blood Culture adequate volume   Culture   Final    NO GROWTH 2 DAYS Performed at [facility], [address], [place], [place] [address]    Report Status PENDING  Incomplete  MRSA Next Gen by PCR, Nasal     Status: Abnormal   Collection Time: 04/02/23  9:42 PM   Specimen: Nasal Mucosa; Nasal Swab  Result Value Ref Range Status   MRSA by PCR Next Gen DETECTED (A) NOT DETECTED Final    Comment: CRITICAL RESULT CALLED TO, READ BACK BY AND VERIFIED WITH: BYRD A. @ 0248 04/03/23 MCLEAN K. (NOTE) The GeneXpert MRSA Assay (FDA approved for NASAL specimens only), is one component of a comprehensive MRSA colonization surveillance program. It is not intended to diagnose MRSA infection nor to guide or monitor treatment for MRSA infections. Test performance is not FDA approved in patients less than 12 years old. Performed at [facility], [address], [place], [place] [address]      Medications:    amiodarone  200 mg Oral Daily   apixaban  2.5 mg Oral BID   aspirin EC  81 mg Oral Daily   atorvastatin  80 mg Oral Daily   Chlorhexidine Gluconate Cloth  6 each Topical Daily   doxycycline  100 mg Oral Q12H   feeding supplement  237 mL Oral BID BM   levothyroxine  75 mcg Oral Q0600   midodrine  10 mg Oral TID WC   multivitamin with minerals  1 tablet Oral Daily   rOPINIRole  0.5-1 mg Oral QHS   Continuous Infusions:  cefTRIAXone (ROCEPHIN)  IV Stopped (04/06/23 0113)      LOS: 4 days   Marinda Elk  [facility]  04/06/2023, 6:55 AM

## 2023-04-06 NOTE — Progress Notes (Signed)
Daughter, Rosey Bath, notified of transfer and updated with new room number.

## 2023-04-06 NOTE — TOC Initial Note (Signed)
Transition of Care [facility]) - Initial/Assessment Note    Patient Details  Name: Fernando Young MRN: [id number] Date of Birth: Jun 02, 1940  [facility] CM/SW Contact:    Otelia Santee, LCSW Phone Number: 04/06/2023, 3:05 PM  Clinical Narrative:                 Pt from home where he has 24/7 support from family. Pt's daughter stays with him during the day and his son stays with him at night. Pt has RW at home and has no further DME needs. Pt was supposed to have started services with [facility] for HHPT/OT on Monday however, was admitted to the hospital. Spoke with Morrie Sheldon at [facility] to confirm services. HHPT/OT orders will need to be placed prior to pt discharging.   Expected Discharge Plan: Home w Home Health Services Barriers to Discharge: Continued Medical Work up   Patient Goals and CMS Choice Patient states their goals for this hospitalization and ongoing recovery are:: To return home CMS Medicare.gov Compare Post Acute Care list provided to:: Patient Choice offered to / list presented to : Patient [place] ownership interest in [facility].provided to::  (NA)    Expected Discharge Plan and Services In-house Referral: Clinical Social Work Discharge Planning Services: NA Post Acute Care Choice: Home Health Living arrangements for the past 2 months: Single Family Home                 DME Arranged: N/A DME Agency: NA       HH Arranged: PT, OT HH Agency: [facility] Date HH Agency Contacted: 04/06/23 Time HH Agency Contacted: 1504 Representative spoke with at [facility] Agency: Bonnye Fava  Prior Living Arrangements/Services Living arrangements for the past 2 months: Single Family Home Lives with:: Self, Adult Children Patient language and need for interpreter reviewed:: Yes Do you feel safe going back to the place where you live?: Yes      Need for Family Participation in Patient Care: No (Comment) Care giver support system in place?: Yes  (comment) Current home services: DME, Home PT, Home OT (RW, HHPT/OT w/ [facility]) Criminal Activity/Legal Involvement Pertinent to Current Situation/Hospitalization: No - Comment as needed  Activities of Daily Living   ADL Screening (condition at time of admission) Independently performs ADLs?: Yes (appropriate for developmental age) Is the patient deaf or have difficulty hearing?: No Does the patient have difficulty seeing, even when wearing glasses/contacts?: Yes Does the patient have difficulty concentrating, remembering, or making decisions?: Yes  Permission Sought/Granted Permission sought to share information with : Facility Medical sales representative, Family Supports Permission granted to share information with : Yes, Verbal Permission Granted  Share Information with NAME: Derrill Kay  Permission granted to share info w AGENCY: [facility]  Permission granted to share info w Relationship: Daughter     Emotional Assessment Appearance:: Appears older than stated age Attitude/Demeanor/Rapport: Engaged Affect (typically observed): Accepting, Pleasant Orientation: : Oriented to Self, Oriented to Place, Oriented to  Time, Oriented to Situation Alcohol / Substance Use: Not Applicable Psych Involvement: No (comment)  Admission diagnosis:  Septic shock (HCC) [A41.9, R65.21] Patient Active Problem List   Diagnosis Date Noted   Chronic HFrEF (heart failure with reduced ejection fraction) (HCC) 04/04/2023   Atrial fibrillation/flutter (HCC) 04/04/2023   Non-ST elevation (NSTEMI) myocardial infarction (HCC) 04/03/2023   Cardiomyopathy (HCC) 04/03/2023   Long term (current) use of anticoagulants 04/03/2023   Dyslipidemia 04/03/2023   Skin cancer 04/03/2023  Septic shock (HCC) 04/02/2023   Diarrhea 01/21/2023   Anemia in neoplastic disease 10/05/2022   Hypotension 08/02/2022    Class: Chronic   E coli bacteremia 02/22/2022   Protein-calorie malnutrition, severe 02/18/2022    Neutropenic fever (HCC) 02/17/2022   Cellulitis of chest wall 12/08/2021   HFrEF (heart failure with reduced ejection fraction) (HCC) 07/03/2021   Benign prostatic hyperplasia with post-void dribbling 06/04/2021   Ectropion of right eye 06/04/2021   Left inguinal hernia 06/04/2021   Diabetes mellitus type 2, noninsulin dependent (HCC) 06/04/2021   Hyperkalemia 06/04/2021    Class: Health Concern   Cancer of the skin, 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   Hypoglycemia 05/06/2021   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 05/02/2021    Class: Chronic   Dehydration 08/03/2020   COVID-19 05/25/2020   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   Iron deficiency anemia 02/20/2020    Class: Chronic   Physical deconditioning 08/28/2019   SBO (small bowel obstruction) (HCC) 08/24/2019   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   Paroxysmal atrial fibrillation (HCC) 08/12/2019   Acute left-sided low back pain with sciatica 05/17/2018   BCC (basal cell carcinoma), face 12/28/2016   History of nonmelanoma skin cancer 12/24/2015   Risk for falls 07/20/2015   CLL (chronic lymphocytic leukemia) (HCC) 05/25/2015   Bilateral carotid artery disease (HCC) 03/20/2015   Restless legs syndrome 04/30/2014   SVT (supraventricular tachycardia) (HCC) 01/24/2014   Ventricular ectopy 01/24/2014   CHF (congestive heart failure) (HCC) 12/25/2013   Fungal nail infection 07/17/2013   B12 deficiency 01/10/2013   Hyperlipidemia associated with type 2 diabetes mellitus (HCC) 01/10/2013   Prostate cancer (HCC) 01/04/2013   Diverticulosis of colon 12/27/2012   Benign hypertension 12/27/2012   History of colonic polyps 12/27/2012   Adenomatous polyp of colon 06/20/2011   PCP:  [name], MD Pharmacy:   [facility] [address], [place] - [address] [place] [address] Phone: [phone] Fax: [phone]     Social Determinants of Health (SDOH) Social History: SDOH Screenings   Food Insecurity: No  Food Insecurity (04/03/2023)  Housing: Low Risk  (04/03/2023)  Transportation Needs: No Transportation Needs (04/03/2023)  Utilities: Not At Risk (04/03/2023)  Financial Resource Strain: Low Risk  (04/18/2022)   [facility] from [facility], [facility]  Physical Activity: Sufficiently Active (04/18/2022)   Received [facility], [facility]  Social Connections: Moderately Isolated (04/18/2022)   Received from [facility], [facility]  Stress: No Stress Concern Present (04/18/2022)   Received from [facility], [facility]  Tobacco Use: Medium Risk (04/02/2023)   Received from [facility]  Health Literacy: Medium Risk (04/18/2022)   Received from [facility], [facility]   SDOH Interventions:     Readmission Risk Interventions    04/06/2023    3:00 PM  Readmission Risk Prevention Plan  Transportation Screening Complete  PCP or Specialist Appt within 5-7 Days Complete  Home Care Screening Complete  Medication Review (RN CM) Complete

## 2023-04-07 ENCOUNTER — Encounter (HOSPITAL_COMMUNITY): Payer: Self-pay | Admitting: Pulmonary Disease

## 2023-04-07 ENCOUNTER — Inpatient Hospital Stay (HOSPITAL_COMMUNITY): Payer: Medicare Other

## 2023-04-07 DIAGNOSIS — I4891 Unspecified atrial fibrillation: Secondary | ICD-10-CM | POA: Diagnosis not present

## 2023-04-07 DIAGNOSIS — R6521 Severe sepsis with septic shock: Secondary | ICD-10-CM | POA: Diagnosis not present

## 2023-04-07 DIAGNOSIS — A419 Sepsis, unspecified organism: Secondary | ICD-10-CM | POA: Diagnosis not present

## 2023-04-07 DIAGNOSIS — E119 Type 2 diabetes mellitus without complications: Secondary | ICD-10-CM | POA: Diagnosis not present

## 2023-04-07 MED ORDER — IOHEXOL 300 MG/ML  SOLN: 30.0000 mL | Freq: Once | INTRAMUSCULAR | Status: DC | PRN

## 2023-04-07 MED ORDER — DOCUSATE SODIUM 283 MG RE ENEM
1.0000 | ENEMA | Freq: Every day | RECTAL | Status: AC
  Administered 2023-04-07 – 2023-04-08 (×2): 283 mg via RECTAL
  Filled 2023-04-07 (×2): qty 1

## 2023-04-07 MED ORDER — POLYETHYLENE GLYCOL 3350 17 G PO PACK
17.0000 g | PACK | Freq: Two times a day (BID) | ORAL | Status: DC
  Administered 2023-04-07 – 2023-04-08 (×2): 17 g via ORAL
  Filled 2023-04-07 (×2): qty 1

## 2023-04-07 NOTE — Plan of Care (Signed)

## 2023-04-07 NOTE — Progress Notes (Signed)
Physical Therapy Treatment Patient Details Name: Fernando Young MRN: [id number] DOB: 11/20/1940 Today's Date: 04/07/2023   History of Present Illness patient is a 82 year old male who presented [facility] ED  with chest pressure,mild abd pain, nausea and vomiting , and loose BM, Fever , sinus tachycardia (140) and hypotensive, Chest xray shows small pleural effusions with basilar infiltration or atelectasis, sepsis.   PMH: CHF, CLL,PAF,GIB,DM, patient report Cerebral Pa;sy with Left extremities affected    PT Comments  Pt was seen in bed with daughter present in room. Pt had been drinking contrast solution for the past 30 minutes according to daughter. Pt agreed to participate in PT. Pt performed supine to sit with max assist + 2 to bring trunk upright and scoot to EOB. Pt sat on EOB with flexed posture. Pt performed sit to stand from elevated bed with RW and mod assist + 2, cues for pt to push off of bed. Pt ambulated 30 ft with RW and max assist + 2. He required cues to stand upright, for safe walker distance and, assist to turn and walk back to room. Gait distance was limited to fatigue/weakness. Pt is very weak today. Pt will benefit from continued PT to increase strength, endurance, mobility, and independence. LPT recommends HHPT.   If plan is discharge home, recommend the following: A little help with walking and/or transfers;A little help with bathing/dressing/bathroom;Help with stairs or ramp for entrance;Assist for transportation;Assistance with cooking/housework   Can travel by private vehicle        Equipment Recommendations  None recommended by PT    Recommendations for Other Services       Precautions / Restrictions Precautions Precautions: Fall Restrictions Weight Bearing Restrictions: No     Mobility  Bed Mobility Overal bed mobility: Needs Assistance Bed Mobility: Supine to Sit     Supine to sit: Used rails, Max assist, HOB elevated     General bed mobility comments:  Increased time, manual assist to bring trunk upright, very weak    Transfers Overall transfer level: Needs assistance Equipment used: Rolling walker (2 wheels) Transfers: Sit to/from Stand Sit to Stand: Mod assist, +2 physical assistance, From elevated surface           General transfer comment: From elevated surface, pt cued to push from bed and stand upright.    Ambulation/Gait Ambulation/Gait assistance: Mod assist, Max assist, +2 physical assistance Gait Distance (Feet): 30 Feet Assistive device: Rolling walker (2 wheels) Gait Pattern/deviations: Decreased step length - right, Decreased step length - left, Trunk flexed, Shuffle, Narrow base of support, Step-to pattern, Decreased weight shift to right, Decreased weight shift to left Gait velocity: decreased     General Gait Details: Worse than yesterday, required assist to "steer" and "drive" walker at times. Pt was cued multiple times to stand upright, but had consistantly flexed posture along with some knee flexion. Turning was max assist with cueing required to move feet and walker. Distance was limited to fatigue   Stairs             Wheelchair Mobility     Tilt Bed    Modified Rankin (Stroke Patients Only)       Balance                                            Cognition Arousal: Alert Behavior During Therapy: [facility]  for tasks assessed/performed Overall Cognitive Status: Within Functional Limits for tasks assessed                                 General Comments: A&O x 3, wants to work with therapy, quick to agitation.        Exercises      General Comments        Pertinent Vitals/Pain Pain Assessment Pain Assessment: No/denies pain    Home Living                          Prior Function            PT Goals (current goals can now be found in the care plan section) Acute Rehab PT Goals Patient Stated Goal: go home PT Goal Formulation: With  patient Time For Goal Achievement: 04/18/23 Progress towards PT goals: Progressing toward goals    Frequency    Min 1X/week      PT Plan      Co-evaluation              AM-PAC PT "6 Clicks" Mobility   Outcome Measure  Help needed turning from your back to your side while in a flat bed without using bedrails?: A Lot Help needed moving from lying on your back to sitting on the side of a flat bed without using bedrails?: A Lot Help needed moving to and from a bed to a chair (including a wheelchair)?: A Lot Help needed standing up from a chair using your arms (e.g., wheelchair or bedside chair)?: A Lot Help needed to walk in hospital room?: A Lot Help needed climbing 3-5 steps with a railing? : Total 6 Click Score: 11    End of Session Equipment Utilized During Treatment: Gait belt Activity Tolerance: Patient tolerated treatment well Patient left: with call bell/phone within reach;in bed;with bed alarm set;with family/visitor present Nurse Communication: Mobility status PT Visit Diagnosis: Unsteadiness on feet (R26.81);Difficulty in walking, not elsewhere classified (R26.2)     Time: 2130-8657 PT Time Calculation (min) (ACUTE ONLY): 16 min  Charges:    $Gait Training: 8-22 mins PT General Charges $$ ACUTE PT VISIT: 1 Visit           Lazaro Arms, SPTA 04/07/2023, 3:02 PM

## 2023-04-07 NOTE — Plan of Care (Signed)
  Problem: Education: Goal: Knowledge of General Education information will improve Description: Including pain rating scale, medication(s)/side effects and non-pharmacologic comfort measures Outcome: Progressing   Problem: Health Behavior/Discharge Planning: Goal: Ability to manage health-related needs will improve Outcome: Progressing   Problem: Clinical Measurements: Goal: Ability to maintain clinical measurements within normal limits will improve Outcome: Progressing Goal: Will remain free from infection Outcome: Progressing Goal: Cardiovascular complication will be avoided Outcome: Progressing   Problem: Nutrition: Goal: Adequate nutrition will be maintained Outcome: Progressing   Problem: Pain Management: Goal: General experience of comfort will improve Outcome: Progressing   Problem: Safety: Goal: Ability to remain free from injury will improve Outcome: Progressing   Problem: Skin Integrity: Goal: Risk for impaired skin integrity will decrease Outcome: Progressing

## 2023-04-07 NOTE — Progress Notes (Signed)
[facility] PROGRESS NOTE    Progress Note  Fernando Young  JSE:[id number] DOB: 13-Aug-1940 DOA: 04/02/2023 PCP: [name], MD     Brief Narrative:   Fernando Young is an 82 y.o. male past medical history of chronic systolic heart failure, chronic atrial fibrillation on Eliquis, diabetes mellitus type 2, anemia of chronic disease, CLL, facial squamous cell carcinoma comes into the ED with abdominal pain nausea and vomiting fever tachycardia hypotensive, was found to be septic of unclear source started on pressors initially now has been off transferred to [facility] on 04/05/2023   Assessment/Plan:   Septic shock (HCC) of unclear source: MRSA swab was positive.  Initially started on Levophed, antibiotics. Statin beta-blocker and aspirin were held. He was started also on aggressive IV fluids. Weaned off Levophed placed on midodrine. To complete a 7-day course of doxycycline and Rocephin as an inpatient. PT OT evaluated the patient will need home health PT. Out of bed to chair. He is not complaining of abdominal pain get a CT scan of the abdomen pelvis .  Elevated troponins/NSTEMI: Twelve-lead EKG showed no significant changes, just some ST segment abnormalities. Troponin as high as 7000 trending down. Likely in the setting of demand ischemia. Cardiology was consulted with a history of CLL and fragility patient elected conservative management.  No plan for cath due to comorbidities, discussed with patient. Was started on aspirin and statin. Beta-blockers were not started due to hypotension and heart rate in the 60s. Transfer to MedSurg.  Chronic systolic heart failure: 2D echo showed no apical thrombus, EF of 35%.  Severe hypokinesia with mildly reduced RV function. GDMT is limited due to hypotension. Chronically he requires midodrine.  Paroxysmal atrial fibrillation/flutter: Continue Eliquis and amiodarone.  Hypomagnesemia: Repleted now improved.  Diabetes  mellitus type 2, noninsulin dependent (HCC) A1c of 5.6 he probably will not need metformin. There is likely due to significant weight loss.  Hypothyroidism: Continue Synthroid.  Anemia of chronic renal disease/CLL: Hemoglobin is relatively stable seems to be at baseline.  Facial basal cell carcinoma: Vismodegib and facial Sq.C.Ca   Severe protein caloric malnutrition: Noted.  Dyslipidemia Continue statins.  DVT prophylaxis: Eliquis Family Communication:none Status is: Inpatient Remains inpatient appropriate because: Hypotension    Code Status:     Code Status Orders  (From admission, onward)           Start     Ordered   04/02/23 2053  Do not attempt resuscitation (DNR)- Limited -Do Not Intubate (DNI)  Continuous       Question Answer Comment  If pulseless and not breathing No CPR or chest compressions.   In Pre-Arrest Conditions (Patient Is Breathing and Has A Pulse) Do not intubate. Provide all appropriate non-invasive medical interventions. Avoid ICU transfer unless indicated or required.   Consent: Discussion documented in EHR or advanced directives reviewed      04/02/23 2053           Code Status History     Date Active Date Inactive Code Status Order ID Comments User Context   02/17/2022 2355 02/25/2022 2321 DNR [id number]  Synetta Fail, MD Inpatient   02/17/2022 2259 02/17/2022 2355 Full Code [id number]  Synetta Fail, MD Inpatient   07/03/2021 2329 07/05/2021 1804 DNR [id number]  Charlsie Quest, MD Inpatient         IV Access:   Peripheral IV   Procedures and diagnostic studies:   No results found.   Medical Consultants:  None.   Subjective:    Fernando Young relates he is having new left lower quadrant abdominal pain.  Objective:    Vitals:   04/06/23 1400 04/06/23 1445 04/06/23 1958 04/07/23 0429  BP: 113/64 120/68 122/72 109/67  Pulse: 63 64 74 65  Resp: (!) 24 18 18 18   Temp:  97.8 F (36.6 C) 98.7 F  (37.1 C) 98.5 F (36.9 C)  TempSrc:  Oral Oral Oral  SpO2: 93% 97% 97% 96%  Weight:      Height:       SpO2: 96 % O2 Flow Rate (L/min): (S) 2 L/min (placed on RA- RN aware) FiO2 (%): 21 %   Intake/Output Summary (Last 24 hours) at 04/07/2023 1019 Last data filed at 04/07/2023 0645 Gross per 24 hour  Intake --  Output 650 ml  Net -650 ml   Filed Weights   04/04/23 0500 04/05/23 0500 04/06/23 0500  Weight: 52.7 kg 55 kg 53.8 kg    Exam: General exam: In no acute distress. Respiratory system: Good air movement and clear to auscultation. Cardiovascular system: S1 & S2 heard, RRR. No JVD. Gastrointestinal system: Positive bowel sounds soft nondistended left lower quadrant tenderness Extremities: No pedal edema. Skin: No rashes, lesions or ulcers Psychiatry: Judgement and insight appear normal. Mood & affect appropriate. Data Reviewed:    Labs: Basic Metabolic Panel: Recent Labs  Lab 04/02/23 2100 04/02/23 2129 04/03/23 0544 04/04/23 0437 04/05/23 0513 04/06/23 0541  NA 138  --  137 137 137 139  K 4.5  --  3.8 4.4 4.7 4.1  CL 109  --  109 109 109 110  CO2 20*  --  20* 21* 23 23  GLUCOSE 151*  --  96 90 108* 103*  BUN 32*  --  28* 23 21 18   CREATININE 1.03  --  0.84 0.67 0.77 0.75  CALCIUM 8.0*  --  7.9* 7.4* 7.8* 7.9*  MG  --   --  2.0  --   --   --   PHOS  --  3.9  --   --   --   --    GFR Estimated Creatinine Clearance: 52.3 mL/min (by C-G formula based on SCr of 0.75 mg/dL). Liver Function Tests: Recent Labs  Lab 04/02/23 2100  AST 51*  ALT 19  ALKPHOS 64  BILITOT 0.5  PROT 5.4*  ALBUMIN 2.8*   Recent Labs  Lab 04/02/23 2129  LIPASE 19   No results for input(s): "AMMONIA" in the last 168 hours. Coagulation profile Recent Labs  Lab 04/03/23 0544  INR 2.1*   COVID-19 Labs  No results for input(s): "DDIMER", "FERRITIN", "LDH", "CRP" in the last 72 hours.  No results found for: "SARSCOV2NAA"  CBC: Recent Labs  Lab 04/02/23 2100  04/03/23 0544 04/04/23 0437 04/05/23 0513 04/06/23 0541  WBC 176.4* 190.1* 171.2* 117.6* 116.9*  NEUTROABS 38.8*  --   --   --   --   HGB 9.1* 8.8* 8.4* 8.1* 8.1*  HCT 31.9* 32.3* 30.0* 28.5* 27.4*  MCV 100.6* 101.6* 98.0 99.0 97.5  PLT 300 276 292 245 228   Cardiac Enzymes: Recent Labs  Lab 04/04/23 0436  CKTOTAL 66   BNP (last 3 results) No results for input(s): "PROBNP" in the last 8760 hours. CBG: Recent Labs  Lab 04/05/23 0738 04/05/23 1256 04/05/23 1543 04/05/23 1947 04/05/23 2334  GLUCAP 107* 116* 141* 147* 124*   D-Dimer: No results for input(s): "DDIMER" in the last 72 hours.  Hgb A1c: No results for input(s): "HGBA1C" in the last 72 hours.  Lipid Profile: No results for input(s): "CHOL", "HDL", "LDLCALC", "TRIG", "CHOLHDL", "LDLDIRECT" in the last 72 hours.  Thyroid function studies: No results for input(s): "TSH", "T4TOTAL", "T3FREE", "THYROIDAB" in the last 72 hours.  Invalid input(s): "FREET3" Anemia work up: No results for input(s): "VITAMINB12", "FOLATE", "FERRITIN", "TIBC", "IRON", "RETICCTPCT" in the last 72 hours. Sepsis Labs: Recent Labs  Lab 04/02/23 2100 04/02/23 2129 04/02/23 2350 04/03/23 0544 04/04/23 0437 04/05/23 0513 04/06/23 0541  PROCALCITON  --  4.73  --  4.93  --   --   --   WBC 176.4*  --   --  190.1* 171.2* 117.6* 116.9*  LATICACIDVEN 1.0  --  0.9  --   --   --   --    Microbiology Recent Results (from the past 240 hour(s))  Culture, blood (Routine X 2) w Reflex to ID Panel     Status: None (Preliminary result)   Collection Time: 04/02/23  9:00 PM   Specimen: BLOOD RIGHT ARM  Result Value Ref Range Status   Specimen Description BLOOD RIGHT ARM  Final   Special Requests   Final    BOTTLES DRAWN AEROBIC ONLY Blood Culture adequate volume   Culture   Final    NO GROWTH 4 DAYS Performed at [facility], [address], [place], [place] [address]    Report Status PENDING  Incomplete  Culture, blood (Routine X 2)  w Reflex to ID Panel     Status: None (Preliminary result)   Collection Time: 04/02/23  9:00 PM   Specimen: BLOOD RIGHT ARM  Result Value Ref Range Status   Specimen Description BLOOD RIGHT ARM  Final   Special Requests   Final    BOTTLES DRAWN AEROBIC ONLY Blood Culture adequate volume   Culture   Final    NO GROWTH 4 DAYS Performed at [facility], [address], [place], [place] [address]    Report Status PENDING  Incomplete  MRSA Next Gen by PCR, Nasal     Status: Abnormal   Collection Time: 04/02/23  9:42 PM   Specimen: Nasal Mucosa; Nasal Swab  Result Value Ref Range Status   MRSA by PCR Next Gen DETECTED (A) NOT DETECTED Final    Comment: CRITICAL RESULT CALLED TO, READ BACK BY AND VERIFIED WITH: BYRD A. @ 0248 04/03/23 MCLEAN K. (NOTE) The GeneXpert MRSA Assay (FDA approved for NASAL specimens only), is one component of a comprehensive MRSA colonization surveillance program. It is not intended to diagnose MRSA infection nor to guide or monitor treatment for MRSA infections. Test performance is not FDA approved in patients less than 61 years old. Performed at [facility], [address] [place], [place] [address]      Medications:    amiodarone  200 mg Oral Daily   apixaban  2.5 mg Oral BID   aspirin EC  81 mg Oral Daily   atorvastatin  80 mg Oral Daily   Chlorhexidine Gluconate Cloth  6 each Topical Daily   doxycycline  100 mg Oral Q12H   feeding supplement  237 mL Oral BID BM   levothyroxine  75 mcg Oral Q0600   midodrine  10 mg Oral TID WC   multivitamin with minerals  1 tablet Oral Daily   mouth rinse  15 mL Mouth Rinse 4 times per day   rOPINIRole  0.5-1 mg Oral QHS  Continuous Infusions:  cefTRIAXone (ROCEPHIN)  IV 2 g (04/06/23 2351)      LOS: 5 days   Marinda Elk  [facility]  04/07/2023, 10:19 AM

## 2023-04-08 DIAGNOSIS — I5022 Chronic systolic (congestive) heart failure: Secondary | ICD-10-CM

## 2023-04-08 DIAGNOSIS — E119 Type 2 diabetes mellitus without complications: Secondary | ICD-10-CM | POA: Diagnosis not present

## 2023-04-08 DIAGNOSIS — A419 Sepsis, unspecified organism: Secondary | ICD-10-CM | POA: Diagnosis not present

## 2023-04-08 DIAGNOSIS — I4891 Unspecified atrial fibrillation: Secondary | ICD-10-CM | POA: Diagnosis not present

## 2023-04-08 LAB — CULTURE, BLOOD (ROUTINE X 2)
Culture: NO GROWTH
Culture: NO GROWTH
Special Requests: ADEQUATE
Special Requests: ADEQUATE

## 2023-04-08 MED ORDER — HEPARIN SOD (PORK) LOCK FLUSH 100 UNIT/ML IV SOLN
500.0000 [IU] | Freq: Once | INTRAVENOUS | Status: AC
  Administered 2023-04-08: 500 [IU] via INTRAVENOUS

## 2023-04-08 MED ORDER — CEFDINIR 300 MG PO CAPS: 300.0000 mg | ORAL_CAPSULE | Freq: Two times a day (BID) | ORAL | 0 refills | Status: AC

## 2023-04-08 MED ORDER — DOXYCYCLINE HYCLATE 100 MG PO TABS: 100.0000 mg | ORAL_TABLET | Freq: Two times a day (BID) | ORAL | 0 refills | Status: DC

## 2023-04-08 MED ORDER — CEFDINIR 300 MG PO CAPS
300.0000 mg | ORAL_CAPSULE | Freq: Two times a day (BID) | ORAL | Status: DC
  Administered 2023-04-08: 300 mg via ORAL
  Filled 2023-04-08 (×2): qty 1

## 2023-04-08 NOTE — Plan of Care (Signed)

## 2023-04-08 NOTE — Plan of Care (Signed)
  Problem: Clinical Measurements: Goal: Ability to maintain clinical measurements within normal limits will improve Outcome: Progressing Goal: Will remain free from infection Outcome: Progressing   Problem: Nutrition: Goal: Adequate nutrition will be maintained Outcome: Progressing   Problem: Pain Management: Goal: General experience of comfort will improve Outcome: Progressing   Problem: Safety: Goal: Ability to remain free from injury will improve Outcome: Progressing   Problem: Skin Integrity: Goal: Risk for impaired skin integrity will decrease Outcome: Progressing   Problem: Fluid Volume: Goal: Ability to maintain a balanced intake and output will improve Outcome: Progressing

## 2023-04-08 NOTE — Progress Notes (Signed)
Mobility Specialist - Progress Note   04/08/23 1122  Mobility  Activity Transferred from chair to bed  Level of Assistance Moderate assist, patient does 50-74%  Assistive Device Stedy  Range of Motion/Exercises Active  Activity Response Tolerated fair  Mobility Referral Yes  Mobility visit 1 Mobility  Mobility Specialist Start Time (ACUTE ONLY) 1100  Mobility Specialist Stop Time (ACUTE ONLY) 1110  Mobility Specialist Time Calculation (min) (ACUTE ONLY) 10 min   Received in chair requesting assistance back to bed. Attempted one STS from chair and unsuccessful. Resorted to steady transfer with no issues. Returned to bed with all needs met.  Marilynne Halsted Mobility Specialist

## 2023-04-08 NOTE — TOC Transition Note (Signed)
Transition of Care [facility]) - CM/SW Discharge Note   Patient Details  Name: Fernando Young MRN: [id number] Date of Birth: 07/27/1940  Transition of Care [facility]) CM/SW Contact:  Adrian Prows, RN Phone Number: 04/08/2023, 2:26 PM   Clinical Narrative:    D/C orders received; HHPT/OT previously arranged w/ [facility]; spoke w/ Tiea , Central Intake at agency; she requested orders be faxed to [phone]; face sheet, orders and face to face faxed per request; electronic confirmation received at 1434; Tiea at agency confirmed receipt of documents; no TOC needs.   Final next level of care: Home w Home Health Services Barriers to Discharge: No Barriers Identified   Patient Goals and CMS Choice CMS Medicare.gov Compare Post Acute Care list provided to:: Patient Choice offered to / list presented to : Patient  Discharge Placement                      Patient and family notified of of transfer: 04/08/23  Discharge Plan and Services Additional resources added to the After Visit Summary for   In-house Referral: Clinical Social Work Discharge Planning Services: NA Post Acute Care Choice: Home Health          DME Arranged: N/A DME Agency: NA       HH Arranged: PT, OT HH Agency: [facility] Date [facility] Agency Contacted: 04/06/23 Time HH Agency Contacted: 1504 Representative spoke with at [facility] Agency: Bonnye Fava  Social Determinants of Health (SDOH) Interventions SDOH Screenings   Food Insecurity: No Food Insecurity (04/03/2023)  Housing: Low Risk  (04/03/2023)  Transportation Needs: No Transportation Needs (04/03/2023)  Utilities: Not At Risk (04/03/2023)  Financial Resource Strain: Low Risk  (04/18/2022)   Received [facility]  Physical Activity: Sufficiently Active (04/18/2022)   Received from [facility], [facility]  Social Connections: Moderately Isolated (04/18/2022)   [facility] from [facility]   Stress: No Stress Concern Present (04/18/2022)   Received from [facility], [facility]  Tobacco Use: Medium Risk (04/02/2023)   Received from [facility]  Health Literacy: Medium Risk (04/18/2022)   Received from [facility], [facility]     Readmission Risk Interventions    04/06/2023    3:00 PM  Readmission Risk Prevention Plan  Transportation Screening Complete  PCP or Specialist Appt within 5-7 Days Complete  Home Care Screening Complete  Medication Review (RN CM) Complete

## 2023-04-08 NOTE — Progress Notes (Signed)
Mobility Specialist - Progress Note   04/08/23 1001  Mobility  Activity Transferred from bed to chair  Level of Assistance Moderate assist, patient does 50-74%  Assistive Device Front wheel walker  Range of Motion/Exercises Active  Activity Response Tolerated well  Mobility Referral Yes  Mobility visit 1 Mobility  Mobility Specialist Start Time (ACUTE ONLY) [id number]  Mobility Specialist Stop Time (ACUTE ONLY) 0940  Mobility Specialist Time Calculation (min) (ACUTE ONLY) 12 min   Pt received in bed and agreed to transfer, Mod A for bed mobility. Mod A for STS. Short, uncoordinated steps to chair where pt was left with all needs met.   Marilynne Halsted Mobility Specialist

## 2023-04-08 NOTE — Discharge Summary (Signed)
Physician Discharge Summary  Fernando Young:[id number] DOB: 06/01/1940 DOA: 04/02/2023  PCP: [name], MD  Admit date: 04/02/2023 Discharge date: 04/08/2023  Admitted From: Home Disposition:  Home  Recommendations for Outpatient Follow-up:  Follow up with PCP in 1-2 weeks Please obtain BMP/CBC in one week Please follow up on the following pending results:  Home Health:No Equipment/Devices:None  Discharge Condition:Stable CODE STATUS:DNR Diet recommendation: Heart Healthy   Brief/Interim Summary:  82 y.o. male past medical history of chronic systolic heart failure, chronic atrial fibrillation on Eliquis, diabetes mellitus type 2, anemia of chronic disease, CLL, facial squamous cell carcinoma comes into the ED with abdominal pain nausea and vomiting fever tachycardia hypotensive, was found to be septic of unclear source started on pressors initially now has been off transferred to [facility] on 04/05/2023    Discharge Diagnoses:  Principal Problem:   Septic shock (HCC) Active Problems:   Diabetes mellitus type 2, noninsulin dependent (HCC)   Benign hypertension   Non-ST elevation (NSTEMI) myocardial infarction (HCC)   Cardiomyopathy (HCC)   Long term (current) use of anticoagulants   Dyslipidemia   Skin cancer   Chronic HFrEF (heart failure with reduced ejection fraction) (HCC)   Atrial fibrillation/flutter (HCC)   Palliative care encounter   DNR (do not resuscitate)   Goals of care, counseling/discussion   Counseling and coordination of care   Need for emotional support  Sepsis of unclear source: Initially admitted by PCCM started on Levophed antibiotics and IV fluids. We were able to wean him to room air restarted back on his midodrine. His workup remained unremarkable. He can will complete 8-day course of antibiotics and outpatient. CT scan of the abdomen pelvis showed no acute abnormalities show foreign body he was not complaining of abdominal pain having  regular bowel movements.  It was past cecal valve. PT OT evaluated the patient recommended home health PT.  Elevated troponin/NSTEMI: Twelve-lead EKG showed no evidence of ischemia troponins 1 12/6998. Cardiology was consulted is likely demand ischemia they recommended that due to his CLL and fragility elected conservative management and no plan for cath. He was continue an aspirin and statin were restarted. Is not a candidate for beta-blocker as he is on midodrine for blood pressure.  Chronic systolic heart failure: GDMT is limited due to hypotension he is chronically requires midodrine. EF 30% will follow-up with cardiology as an outpatient.  Paroxysmal atrial fibrillation: Continue Eliquis and amiodarone.  Hypomagnesemia: Repleted now resolved.  Hypothyroidism: Continue Synthroid.  Anemia of chronic renal disease/CLL: Heme remained stable. He has a leukocytosis of 100,000 on admission it was almost 200 after antibiotics were started his white blood cell count came down to baseline.  Facial cell carcinoma: Vismodegib and facial Sq.C.Ca    Severe protein caloric malnutrition: Noted.   Dyslipidemia Continue statins.  Discharge Instructions  Discharge Instructions     Diet - low sodium heart healthy   Complete by: As directed    Increase activity slowly   Complete by: As directed    No wound care   Complete by: As directed       Allergies as of 04/08/2023   No Known Allergies      Medication List     STOP taking these medications    erythromycin ophthalmic ointment   Fish Oil Ultra 1400 MG Caps   ondansetron 4 MG tablet Commonly known as: ZOFRAN   pantoprazole 40 MG tablet Commonly known as: PROTONIX       TAKE these  medications    acetaminophen 500 MG tablet Commonly known as: TYLENOL Take 1,000 mg by mouth every 6 (six) hours as needed for mild pain (pain score 1-3) or fever.   amiodarone 200 MG tablet Commonly known as: PACERONE Take 200  mg by mouth daily.   atorvastatin 20 MG tablet Commonly known as: LIPITOR Take 20 mg by mouth at bedtime.   Benefiber Powd Take 4 g by mouth See admin instructions. Mix 4 grams (1 teaspoonful) into a cup of coffee and drink every morning   bismuth subsalicylate 262 MG/15ML suspension Commonly known as: PEPTO BISMOL Take 30 mLs by mouth every 6 (six) hours as needed for diarrhea or loose stools or indigestion.   cefdinir 300 MG capsule Commonly known as: OMNICEF Take 1 capsule (300 mg total) by mouth every 12 (twelve) hours for 2 days.   cholecalciferol 25 MCG (1000 UNIT) tablet Commonly known as: VITAMIN D3 Take 1,000 Units by mouth daily.   cholestyramine 4 g packet Commonly known as: QUESTRAN Take 4 g by mouth daily.   diphenoxylate-atropine 2.5-0.025 MG tablet Commonly known as: LOMOTIL Take 1 tablet by mouth See admin instructions. Take 2 tablets by mouth in the morning and an additional 1 tablet up to twice a day as needed for loose stools   doxycycline 100 MG tablet Commonly known as: VIBRA-TABS Take 1 tablet (100 mg total) by mouth every 12 (twelve) hours.   Eliquis 5 MG Tabs tablet Generic drug: apixaban Take 5 mg by mouth 2 (two) times daily.   furosemide 40 MG tablet Commonly known as: LASIX Take 0.5 tablets (20 mg total) by mouth daily.   levothyroxine 75 MCG tablet Commonly known as: SYNTHROID Take 75 mcg by mouth daily before breakfast.   metFORMIN 500 MG 24 hr tablet Commonly known as: GLUCOPHAGE-XR Take 500 mg by mouth 2 (two) times daily.   midodrine 5 MG tablet Commonly known as: PROAMATINE TAKE 1 TABLET (5 MG TOTAL) BY MOUTH 3 (THREE) TIMES DAILY WITH MEALS.   nitroGLYCERIN 0.4 MG SL tablet Commonly known as: NITROSTAT Place 0.4 mg under the tongue every 5 (five) minutes as needed for chest pain.   rOPINIRole 1 MG tablet Commonly known as: REQUIP Take 1 mg by mouth at bedtime.   Vitamin C 500 MG Caps Take 500 mg by mouth daily.         No Known Allergies  Consultations: Pulmonary and critical care   Procedures/Studies: CT ABDOMEN PELVIS WO CONTRAST  Result Date: 04/07/2023 CLINICAL DATA:  Abdominal pain EXAM: CT ABDOMEN AND PELVIS WITHOUT CONTRAST TECHNIQUE: Multidetector CT imaging of the abdomen and pelvis was performed following the standard protocol without IV contrast. RADIATION DOSE REDUCTION: This exam was performed according to the departmental dose-optimization program which includes automated exposure control, adjustment of the mA and/or kV according to patient size and/or use of iterative reconstruction technique. COMPARISON:  None Available. FINDINGS: Lower chest: Small bilateral pleural effusions. Associated lower lobe opacities, left greater than right, atelectasis versus pneumonia. Hepatobiliary: Unenhanced liver is grossly unremarkable, noting benign calcified granulomata. Gallbladder is unremarkable. No intrahepatic or extrahepatic ductal dilatation. Pancreas: Within normal limits. Spleen: Within normal limits. Adrenals/Urinary Tract: Adrenal glands are within normal limits. 2 mm nonobstructing right lower pole renal calculus (coronal image 57). Three nonobstructing left lower pole renal calculi measuring up to 3 mm (series 2/image 34). No ureteral or bladder calculi. No hydronephrosis. Bladder is within normal limits. Stomach/Bowel: Stomach is within normal limits. Mildly dilated loops small bowel  in the central abdomen. Small bowel stasis in the distal/terminal ileum (series 2/image 6) with associated 4.5 cm tubular radiopaque foreign body at the ileocecal valve (series 2/image 62). Colon is not decompressed, arguing against true obstruction. Vascular/Lymphatic: No evidence of abdominal aortic aneurysm. Atherosclerotic calcifications of the abdominal aorta and branch vessels. No suspicious abdominopelvic lymphadenopathy. Reproductive: Brachytherapy seeds along the prostate. Other: No abdominopelvic ascites. Large  left inguinal/scrotal hernia containing nondilated loops of left colon. Musculoskeletal: Degenerative changes of the lower lumbar spine. IMPRESSION: Radiopaque foreign body at the ileocecal valve, as described above. Associated small bowel stasis. Colon is not decompressed, favoring adynamic ileus over true small bowel obstruction. Large left inguinal/scrotal hernia containing nondilated loops of left colon. Small bilateral pleural effusions. Associated lower lobe opacities, left greater than right, atelectasis versus pneumonia. Electronically Signed   By: Charline Bills M.D.   On: 04/07/2023 18:12   ECHOCARDIOGRAM COMPLETE  Result Date: 04/03/2023    ECHOCARDIOGRAM REPORT   Patient Name:   Fernando Young Date of Exam: 04/03/2023 Medical Rec #:  [id number]      Height:       70.0 in Accession #:    [id number]     Weight:       149.9 lb Date of Birth:  1940/09/02      BSA:          1.847 m Patient Age:    82 years       BP:           114/72 mmHg Patient Gender: M              HR:           81 bpm. Exam Location:  Inpatient Procedure: 2D Echo, Color Doppler, Cardiac Doppler and Intracardiac            Opacification Agent Indications:    Elevated Troponin  History:        Patient has no prior history of Echocardiogram examinations.                 CHF; Risk Factors:Hypertension, Diabetes and Dyslipidemia.  Sonographer:    Harriette Bouillon RDCS Referring Phys: [id number] OMAR M ALBUSTAMI  Sonographer Comments: Technically difficult study due to poor echo windows. IMPRESSIONS  1. Technically difficult study with reduced echo windows  2. No apical thrombus with Definity enhancing agent. Left ventricular ejection fraction, by estimation, is 35 to 40%. The left ventricle has moderately decreased function. The left ventricle demonstrates regional wall motion abnormalities (see scoring diagram/findings for description). Left ventricular diastolic parameters are consistent with Grade I diastolic dysfunction (impaired  relaxation). There is severe hypokinesis of the left ventricular, mid-apical septal wall, apical segment, anteroseptal wall, inferior wall and anterior wall.  3. Right ventricular systolic function is mildly reduced. The right ventricular size is normal. There is normal pulmonary artery systolic pressure. The estimated right ventricular systolic pressure is 33.8 mmHg.  4. Left atrial size was moderately dilated.  5. The mitral valve is abnormal. Mild mitral valve regurgitation.  6. The tricuspid valve is abnormal.  7. The aortic valve was not well visualized. Aortic valve regurgitation is not visualized.  8. The inferior vena cava is normal in size with <50% respiratory variability, suggesting right atrial pressure of 8 mmHg. Comparison(s): No prior Echocardiogram. FINDINGS  Left Ventricle: No apical thrombus with Definity enhancing agent. Left ventricular ejection fraction, by estimation, is 35 to 40%. The left ventricle has moderately decreased function. The left ventricle  demonstrates regional wall motion abnormalities. Severe hypokinesis of the left ventricular, mid-apical septal wall, apical segment, anteroseptal wall, inferior wall and anterior wall. Definity contrast agent was given IV to delineate the left ventricular endocardial borders. The left ventricular internal cavity size was normal in size. There is no left ventricular hypertrophy. Left ventricular diastolic parameters are consistent with Grade I diastolic dysfunction (impaired relaxation). Indeterminate filling pressures.  LV Wall Scoring: The mid and distal anterior septum and entire apex are hypokinetic. Right Ventricle: The right ventricular size is normal. No increase in right ventricular wall thickness. Right ventricular systolic function is mildly reduced. There is normal pulmonary artery systolic pressure. The tricuspid regurgitant velocity is 2.54 m/s, and with an assumed right atrial pressure of 8 mmHg, the estimated right ventricular  systolic pressure is 33.8 mmHg. Left Atrium: Left atrial size was moderately dilated. Right Atrium: Right atrial size was normal in size. Pericardium: Trivial pericardial effusion is present. The pericardial effusion is localized near the right ventricle. Mitral Valve: The mitral valve is abnormal. Mild mitral valve regurgitation. Tricuspid Valve: The tricuspid valve is abnormal. Tricuspid valve regurgitation is mild. Aortic Valve: The aortic valve was not well visualized. Aortic valve regurgitation is not visualized. Pulmonic Valve: The pulmonic valve was not well visualized. Pulmonic valve regurgitation is not visualized. Aorta: The aortic root and ascending aorta are structurally normal, with no evidence of dilitation. Venous: The inferior vena cava is normal in size with less than 50% respiratory variability, suggesting right atrial pressure of 8 mmHg. IAS/Shunts: No atrial level shunt detected by color flow Doppler.  LEFT VENTRICLE PLAX 2D LVIDd:         5.70 cm      Diastology LVIDs:         4.70 cm      LV e' medial:    4.90 cm/s LV PW:         0.80 cm      LV E/e' medial:  9.8 LV IVS:        0.70 cm      LV e' lateral:   8.16 cm/s LVOT diam:     2.20 cm      LV E/e' lateral: 5.9 LV SV:         69 LV SV Index:   37 LVOT Area:     3.80 cm  LV Volumes (MOD) LV vol d, MOD A2C: 113.2 ml LV vol d, MOD A4C: 138.9 ml LV vol s, MOD A2C: 61.9 ml LV vol s, MOD A4C: 88.4 ml LV SV MOD A2C:     51.3 ml LV SV MOD A4C:     138.9 ml LV SV MOD BP:      51.6 ml RIGHT VENTRICLE            IVC RV S prime:     8.38 cm/s  IVC diam: 1.80 cm TAPSE (M-mode): 2.3 cm LEFT ATRIUM           Index        RIGHT ATRIUM           Index LA diam:      3.90 cm 2.11 cm/m   RA Area:     15.90 cm LA Vol (A2C): 38.8 ml 21.01 ml/m  RA Volume:   42.70 ml  23.12 ml/m LA Vol (A4C): 70.2 ml 38.01 ml/m  AORTIC VALVE LVOT Vmax:   99.20 cm/s LVOT Vmean:  68.300 cm/s LVOT VTI:    0.182 m  AORTA  Ao Root diam: 3.20 cm MITRAL VALVE                TRICUSPID VALVE MV Area (PHT): 2.55 cm    TR Peak grad:   25.8 mmHg MV Decel Time: 298 msec    TR Vmax:        254.00 cm/s MV E velocity: 48.00 cm/s MV A velocity: 84.80 cm/s  SHUNTS MV E/A ratio:  0.57        Systemic VTI:  0.18 m                            Systemic Diam: 2.20 cm Zoila Shutter MD Electronically signed by Zoila Shutter MD Signature Date/Time: 04/03/2023/11:27:35 AM    Final    DG CHEST PORT 1 VIEW  Result Date: 04/02/2023 CLINICAL DATA:  Leukocytosis EXAM: PORTABLE CHEST 1 VIEW COMPARISON:  02/20/2022 FINDINGS: Right central venous catheter with tip over the low SVC region. No pneumothorax. Cardiac enlargement. Probable small bilateral pleural effusions with infiltration or atelectasis in the lung bases. This is similar to prior study. Calcification of the aorta. Mediastinal contours appear intact. IMPRESSION: Cardiac enlargement. Small pleural effusions with basilar infiltration or atelectasis, similar to prior study. Electronically Signed   By: Burman Nieves M.D.   On: 04/02/2023 20:51   (Echo, Carotid, EGD, Colonoscopy, ERCP)    Subjective: No complaints  Discharge Exam: Vitals:   04/07/23 1957 04/08/23 0437  BP: 129/76 117/74  Pulse: 62 65  Resp: 18 18  Temp: 97.6 F (36.4 C) 97.6 F (36.4 C)  SpO2: 98% 98%   Vitals:   04/07/23 0705 04/07/23 1957 04/08/23 0437 04/08/23 0500  BP:  129/76 117/74   Pulse:  62 65   Resp:  18 18   Temp:  97.6 F (36.4 C) 97.6 F (36.4 C)   TempSrc:  Oral Oral   SpO2:  98% 98%   Weight: 61.9 kg   62.8 kg  Height:        General: Pt is alert, awake, not in acute distress Cardiovascular: RRR, S1/S2 +, no rubs, no gallops Respiratory: CTA bilaterally, no wheezing, no rhonchi Abdominal: Soft, NT, ND, bowel sounds + Extremities: no edema, no cyanosis    The results of significant diagnostics from this hospitalization (including imaging, microbiology, ancillary and laboratory) are listed below for reference.      Microbiology: Recent Results (from the past 240 hour(s))  Culture, blood (Routine X 2) w Reflex to ID Panel     Status: None   Collection Time: 04/02/23  9:00 PM   Specimen: BLOOD RIGHT ARM  Result Value Ref Range Status   Specimen Description BLOOD RIGHT ARM  Final   Special Requests   Final    BOTTLES DRAWN AEROBIC ONLY Blood Culture adequate volume   Culture   Final    NO GROWTH 5 DAYS Performed at [facility], [address], [place], [place] [address]    Report Status 04/08/2023 FINAL  Final  Culture, blood (Routine X 2) w Reflex to ID Panel     Status: None   Collection Time: 04/02/23  9:00 PM   Specimen: BLOOD RIGHT ARM  Result Value Ref Range Status   Specimen Description BLOOD RIGHT ARM  Final   Special Requests   Final    BOTTLES DRAWN AEROBIC ONLY Blood Culture adequate volume   Culture   Final    NO GROWTH 5 DAYS Performed  at [facility], [address] [place], [place] [address]    Report Status 04/08/2023 FINAL  Final  MRSA Next Gen by PCR, Nasal     Status: Abnormal   Collection Time: 04/02/23  9:42 PM   Specimen: Nasal Mucosa; Nasal Swab  Result Value Ref Range Status   MRSA by PCR Next Gen DETECTED (A) NOT DETECTED Final    Comment: CRITICAL RESULT CALLED TO, READ BACK BY AND VERIFIED WITH: BYRD A. @ 0248 04/03/23 MCLEAN K. (NOTE) The GeneXpert MRSA Assay (FDA approved for NASAL specimens only), is one component of a comprehensive MRSA colonization surveillance program. It is not intended to diagnose MRSA infection nor to guide or monitor treatment for MRSA infections. Test performance is not FDA approved in patients less than 61 years old. Performed at [facility], [address] [place], [place] [address]      Labs: BNP (last 3 results) No results for input(s): "BNP" in the last 8760 hours. Basic Metabolic Panel: Recent Labs  Lab 04/02/23 2100 04/02/23 2129 04/03/23 0544 04/04/23 0437 04/05/23 0513  04/06/23 0541  NA 138  --  137 137 137 139  K 4.5  --  3.8 4.4 4.7 4.1  CL 109  --  109 109 109 110  CO2 20*  --  20* 21* 23 23  GLUCOSE 151*  --  96 90 108* 103*  BUN 32*  --  28* 23 21 18   CREATININE 1.03  --  0.84 0.67 0.77 0.75  CALCIUM 8.0*  --  7.9* 7.4* 7.8* 7.9*  MG  --   --  2.0  --   --   --   PHOS  --  3.9  --   --   --   --    Liver Function Tests: Recent Labs  Lab 04/02/23 2100  AST 51*  ALT 19  ALKPHOS 64  BILITOT 0.5  PROT 5.4*  ALBUMIN 2.8*   Recent Labs  Lab 04/02/23 2129  LIPASE 19   No results for input(s): "AMMONIA" in the last 168 hours. CBC: Recent Labs  Lab 04/02/23 2100 04/03/23 0544 04/04/23 0437 04/05/23 0513 04/06/23 0541  WBC 176.4* 190.1* 171.2* 117.6* 116.9*  NEUTROABS 38.8*  --   --   --   --   HGB 9.1* 8.8* 8.4* 8.1* 8.1*  HCT 31.9* 32.3* 30.0* 28.5* 27.4*  MCV 100.6* 101.6* 98.0 99.0 97.5  PLT 300 276 292 245 228   Cardiac Enzymes: Recent Labs  Lab 04/04/23 0436  CKTOTAL 66   BNP: Invalid input(s): "POCBNP" CBG: Recent Labs  Lab 04/05/23 0738 04/05/23 1256 04/05/23 1543 04/05/23 1947 04/05/23 2334  GLUCAP 107* 116* 141* 147* 124*   D-Dimer No results for input(s): "DDIMER" in the last 72 hours. Hgb A1c No results for input(s): "HGBA1C" in the last 72 hours. Lipid Profile No results for input(s): "CHOL", "HDL", "LDLCALC", "TRIG", "CHOLHDL", "LDLDIRECT" in the last 72 hours. Thyroid function studies No results for input(s): "TSH", "T4TOTAL", "T3FREE", "THYROIDAB" in the last 72 hours.  Invalid input(s): "FREET3" Anemia work up No results for input(s): "VITAMINB12", "FOLATE", "FERRITIN", "TIBC", "IRON", "RETICCTPCT" in the last 72 hours. Urinalysis    Component Value Date/Time   COLORURINE YELLOW 04/03/2023 0226   APPEARANCEUR CLOUDY (A) 04/03/2023 0226   LABSPEC 1.017 04/03/2023 0226   PHURINE 5.0 04/03/2023 0226   GLUCOSEU NEGATIVE 04/03/2023 0226   HGBUR LARGE (A) 04/03/2023 0226   BILIRUBINUR  NEGATIVE 04/03/2023 0226  KETONESUR NEGATIVE 04/03/2023 0226   PROTEINUR 30 (A) 04/03/2023 0226   NITRITE NEGATIVE 04/03/2023 0226   LEUKOCYTESUR TRACE (A) 04/03/2023 0226   Sepsis Labs Recent Labs  Lab 04/03/23 0544 04/04/23 0437 04/05/23 0513 04/06/23 0541  WBC 190.1* 171.2* 117.6* 116.9*   Microbiology Recent Results (from the past 240 hour(s))  Culture, blood (Routine X 2) w Reflex to ID Panel     Status: None   Collection Time: 04/02/23  9:00 PM   Specimen: BLOOD RIGHT ARM  Result Value Ref Range Status   Specimen Description BLOOD RIGHT ARM  Final   Special Requests   Final    BOTTLES DRAWN AEROBIC ONLY Blood Culture adequate volume   Culture   Final    NO GROWTH 5 DAYS Performed at [facility], [address], [place], [place] [address]    Report Status 04/08/2023 FINAL  Final  Culture, blood (Routine X 2) w Reflex to ID Panel     Status: None   Collection Time: 04/02/23  9:00 PM   Specimen: BLOOD RIGHT ARM  Result Value Ref Range Status   Specimen Description BLOOD RIGHT ARM  Final   Special Requests   Final    BOTTLES DRAWN AEROBIC ONLY Blood Culture adequate volume   Culture   Final    NO GROWTH 5 DAYS Performed at [facility], [address] [place], [place] [address]    Report Status 04/08/2023 FINAL  Final  MRSA Next Gen by PCR, Nasal     Status: Abnormal   Collection Time: 04/02/23  9:42 PM   Specimen: Nasal Mucosa; Nasal Swab  Result Value Ref Range Status   MRSA by PCR Next Gen DETECTED (A) NOT DETECTED Final    Comment: CRITICAL RESULT CALLED TO, READ BACK BY AND VERIFIED WITH: BYRD A. @ 0248 04/03/23 MCLEAN K. (NOTE) The GeneXpert MRSA Assay (FDA approved for NASAL specimens only), is one component of a comprehensive MRSA colonization surveillance program. It is not intended to diagnose MRSA infection nor to guide or monitor treatment for MRSA infections. Test performance is not FDA approved in patients less than 32  years old. Performed at [facility], [address] [place], [place] [address]      Time coordinating discharge: Over 35 minutes  SIGNED:   Marinda Elk, MD  [facility] 04/08/2023, 9:49 AM Pager   If 7PM-7AM, please contact night-coverage [email or web address] Password [email or web address]

## 2023-04-09 ENCOUNTER — Other Ambulatory Visit: Payer: Self-pay

## 2023-04-20 ENCOUNTER — Other Ambulatory Visit: Payer: Self-pay

## 2023-04-21 ENCOUNTER — Other Ambulatory Visit: Payer: Self-pay

## 2023-04-26 ENCOUNTER — Other Ambulatory Visit: Payer: Self-pay

## 2023-04-26 ENCOUNTER — Emergency Department (HOSPITAL_COMMUNITY): Payer: Medicare Other

## 2023-04-26 ENCOUNTER — Inpatient Hospital Stay (HOSPITAL_COMMUNITY)
Admission: EM | Admit: 2023-04-26 | Discharge: 2023-05-02 | DRG: 871 | Disposition: A | Discharge status: Home Health | Payer: Medicare Other | Attending: Internal Medicine | Admitting: Internal Medicine

## 2023-04-26 ENCOUNTER — Encounter (HOSPITAL_COMMUNITY): Payer: Self-pay

## 2023-04-26 DIAGNOSIS — Z79899 Other long term (current) drug therapy: Secondary | ICD-10-CM

## 2023-04-26 DIAGNOSIS — C9110 Chronic lymphocytic leukemia of B-cell type not having achieved remission: Secondary | ICD-10-CM | POA: Diagnosis present

## 2023-04-26 DIAGNOSIS — E785 Hyperlipidemia, unspecified: Secondary | ICD-10-CM | POA: Diagnosis present

## 2023-04-26 DIAGNOSIS — Z1152 Encounter for screening for COVID-19: Secondary | ICD-10-CM | POA: Diagnosis not present

## 2023-04-26 DIAGNOSIS — I252 Old myocardial infarction: Secondary | ICD-10-CM

## 2023-04-26 DIAGNOSIS — R64 Cachexia: Secondary | ICD-10-CM | POA: Diagnosis present

## 2023-04-26 DIAGNOSIS — K5000 Crohn's disease of small intestine without complications: Secondary | ICD-10-CM | POA: Diagnosis present

## 2023-04-26 DIAGNOSIS — E119 Type 2 diabetes mellitus without complications: Secondary | ICD-10-CM | POA: Diagnosis present

## 2023-04-26 DIAGNOSIS — I480 Paroxysmal atrial fibrillation: Secondary | ICD-10-CM | POA: Diagnosis present

## 2023-04-26 DIAGNOSIS — E875 Hyperkalemia: Secondary | ICD-10-CM | POA: Diagnosis present

## 2023-04-26 DIAGNOSIS — I4891 Unspecified atrial fibrillation: Secondary | ICD-10-CM

## 2023-04-26 DIAGNOSIS — E861 Hypovolemia: Secondary | ICD-10-CM | POA: Diagnosis present

## 2023-04-26 DIAGNOSIS — I110 Hypertensive heart disease with heart failure: Secondary | ICD-10-CM | POA: Diagnosis present

## 2023-04-26 DIAGNOSIS — W449XXA Unspecified foreign body entering into or through a natural orifice, initial encounter: Secondary | ICD-10-CM | POA: Diagnosis present

## 2023-04-26 DIAGNOSIS — Z79890 Hormone replacement therapy (postmenopausal): Secondary | ICD-10-CM

## 2023-04-26 DIAGNOSIS — J189 Pneumonia, unspecified organism: Secondary | ICD-10-CM | POA: Diagnosis present

## 2023-04-26 DIAGNOSIS — E039 Hypothyroidism, unspecified: Secondary | ICD-10-CM | POA: Diagnosis present

## 2023-04-26 DIAGNOSIS — E43 Unspecified severe protein-calorie malnutrition: Secondary | ICD-10-CM | POA: Diagnosis present

## 2023-04-26 DIAGNOSIS — I9589 Other hypotension: Secondary | ICD-10-CM | POA: Diagnosis present

## 2023-04-26 DIAGNOSIS — Z7901 Long term (current) use of anticoagulants: Secondary | ICD-10-CM

## 2023-04-26 DIAGNOSIS — E871 Hypo-osmolality and hyponatremia: Secondary | ICD-10-CM | POA: Diagnosis present

## 2023-04-26 DIAGNOSIS — Z85828 Personal history of other malignant neoplasm of skin: Secondary | ICD-10-CM | POA: Diagnosis not present

## 2023-04-26 DIAGNOSIS — R6521 Severe sepsis with septic shock: Secondary | ICD-10-CM | POA: Diagnosis present

## 2023-04-26 DIAGNOSIS — I5042 Chronic combined systolic (congestive) and diastolic (congestive) heart failure: Secondary | ICD-10-CM | POA: Diagnosis present

## 2023-04-26 DIAGNOSIS — Z7189 Other specified counseling: Secondary | ICD-10-CM | POA: Diagnosis not present

## 2023-04-26 DIAGNOSIS — T183XXA Foreign body in small intestine, initial encounter: Secondary | ICD-10-CM | POA: Diagnosis present

## 2023-04-26 DIAGNOSIS — Z8546 Personal history of malignant neoplasm of prostate: Secondary | ICD-10-CM

## 2023-04-26 DIAGNOSIS — Z66 Do not resuscitate: Secondary | ICD-10-CM | POA: Diagnosis present

## 2023-04-26 DIAGNOSIS — R54 Age-related physical debility: Secondary | ICD-10-CM | POA: Diagnosis present

## 2023-04-26 DIAGNOSIS — D509 Iron deficiency anemia, unspecified: Secondary | ICD-10-CM | POA: Diagnosis present

## 2023-04-26 DIAGNOSIS — A419 Sepsis, unspecified organism: Secondary | ICD-10-CM | POA: Diagnosis not present

## 2023-04-26 DIAGNOSIS — Z515 Encounter for palliative care: Secondary | ICD-10-CM | POA: Diagnosis not present

## 2023-04-26 DIAGNOSIS — Z681 Body mass index (BMI) 19 or less, adult: Secondary | ICD-10-CM | POA: Diagnosis not present

## 2023-04-26 DIAGNOSIS — R627 Adult failure to thrive: Secondary | ICD-10-CM | POA: Diagnosis present

## 2023-04-26 DIAGNOSIS — Z7984 Long term (current) use of oral hypoglycemic drugs: Secondary | ICD-10-CM

## 2023-04-26 DIAGNOSIS — Z87891 Personal history of nicotine dependence: Secondary | ICD-10-CM

## 2023-04-26 LAB — CBC WITH DIFFERENTIAL/PLATELET
Abs Immature Granulocytes: 0 10*3/uL (ref 0.00–0.07)
Basophils Absolute: 1.8 10*3/uL — ABNORMAL HIGH (ref 0.0–0.1)
Basophils Relative: 1 %
Eosinophils Absolute: 1.8 10*3/uL — ABNORMAL HIGH (ref 0.0–0.5)
Eosinophils Relative: 1 %
HCT: 33.4 % — ABNORMAL LOW (ref 39.0–52.0)
Hemoglobin: 9.7 g/dL — ABNORMAL LOW (ref 13.0–17.0)
Lymphocytes Relative: 95 %
Lymphs Abs: 167.2 10*3/uL — ABNORMAL HIGH (ref 0.7–4.0)
MCH: 28.4 pg (ref 26.0–34.0)
MCHC: 29 g/dL — ABNORMAL LOW (ref 30.0–36.0)
MCV: 97.7 fL (ref 80.0–100.0)
Monocytes Absolute: 1.8 10*3/uL — ABNORMAL HIGH (ref 0.1–1.0)
Monocytes Relative: 1 %
Neutro Abs: 3.5 10*3/uL (ref 1.7–7.7)
Neutrophils Relative %: 2 %
Platelets: 274 10*3/uL (ref 150–400)
RBC: 3.42 MIL/uL — ABNORMAL LOW (ref 4.22–5.81)
RDW: 16.2 % — ABNORMAL HIGH (ref 11.5–15.5)
Smear Review: NORMAL
WBC: 176 10*3/uL (ref 4.0–10.5)
nRBC: 0 % (ref 0.0–0.2)

## 2023-04-26 LAB — URINALYSIS, W/ REFLEX TO CULTURE (INFECTION SUSPECTED)
Bilirubin Urine: NEGATIVE
Glucose, UA: NEGATIVE mg/dL
Ketones, ur: 5 mg/dL — AB
Nitrite: NEGATIVE
Protein, ur: NEGATIVE mg/dL
Specific Gravity, Urine: 1.033 — ABNORMAL HIGH (ref 1.005–1.030)
WBC, UA: >50 WBC/hpf (ref 0–5)
pH: 5 (ref 5.0–8.0)

## 2023-04-26 LAB — COMPREHENSIVE METABOLIC PANEL
ALT: 15 U/L (ref 0–44)
AST: 39 U/L (ref 15–41)
Albumin: 3 g/dL — ABNORMAL LOW (ref 3.5–5.0)
Alkaline Phosphatase: 64 U/L (ref 38–126)
Anion gap: 11 (ref 5–15)
BUN: 26 mg/dL — ABNORMAL HIGH (ref 8–23)
CO2: 25 mmol/L (ref 22–32)
Calcium: 7.8 mg/dL — ABNORMAL LOW (ref 8.9–10.3)
Chloride: 99 mmol/L (ref 98–111)
Creatinine, Ser: 1.12 mg/dL (ref 0.61–1.24)
GFR, Estimated: >60 mL/min (ref >60)
Glucose, Bld: 133 mg/dL — ABNORMAL HIGH (ref 70–99)
Potassium: >7.5 mmol/L (ref 3.5–5.1)
Sodium: 135 mmol/L (ref 135–145)
Total Bilirubin: 1.2 mg/dL — ABNORMAL HIGH (ref <1.2)
Total Protein: 5.7 g/dL — ABNORMAL LOW (ref 6.5–8.1)

## 2023-04-26 LAB — GLUCOSE, CAPILLARY
Glucose-Capillary: 116 mg/dL — ABNORMAL HIGH (ref 70–99)
Glucose-Capillary: 118 mg/dL — ABNORMAL HIGH (ref 70–99)
Glucose-Capillary: 205 mg/dL — ABNORMAL HIGH (ref 70–99)
Glucose-Capillary: 84 mg/dL (ref 70–99)

## 2023-04-26 LAB — BASIC METABOLIC PANEL
Anion gap: 10 (ref 5–15)
BUN: 24 mg/dL — ABNORMAL HIGH (ref 8–23)
CO2: 24 mmol/L (ref 22–32)
Calcium: 7.5 mg/dL — ABNORMAL LOW (ref 8.9–10.3)
Chloride: 101 mmol/L (ref 98–111)
Creatinine, Ser: 1.02 mg/dL (ref 0.61–1.24)
GFR, Estimated: >60 mL/min (ref >60)
Glucose, Bld: 116 mg/dL — ABNORMAL HIGH (ref 70–99)
Potassium: 6.7 mmol/L (ref 3.5–5.1)
Sodium: 135 mmol/L (ref 135–145)

## 2023-04-26 LAB — I-STAT CHEM 8, ED
BUN: 26 mg/dL — ABNORMAL HIGH (ref 8–23)
Calcium, Ion: 0.98 mmol/L — ABNORMAL LOW (ref 1.15–1.40)
Chloride: 99 mmol/L (ref 98–111)
Creatinine, Ser: 1 mg/dL (ref 0.61–1.24)
Glucose, Bld: 105 mg/dL — ABNORMAL HIGH (ref 70–99)
HCT: 27 % — ABNORMAL LOW (ref 39.0–52.0)
Hemoglobin: 9.2 g/dL — ABNORMAL LOW (ref 13.0–17.0)
Potassium: 4.3 mmol/L (ref 3.5–5.1)
Sodium: 137 mmol/L (ref 135–145)
TCO2: 28 mmol/L (ref 22–32)

## 2023-04-26 LAB — I-STAT CG4 LACTIC ACID, ED
Lactic Acid, Venous: 1.1 mmol/L (ref 0.5–1.9)
Lactic Acid, Venous: 0.7 mmol/L (ref 0.5–1.9)

## 2023-04-26 LAB — APTT: aPTT: 34 s (ref 24–36)

## 2023-04-26 LAB — RESP PANEL BY RT-PCR (RSV, FLU A&B, COVID)  RVPGX2
Influenza A by PCR: NEGATIVE
Influenza B by PCR: NEGATIVE
Resp Syncytial Virus by PCR: NEGATIVE
SARS Coronavirus 2 by RT PCR: NEGATIVE

## 2023-04-26 LAB — PROTIME-INR
INR: 1.8 — ABNORMAL HIGH (ref 0.8–1.2)
Prothrombin Time: 20.7 s — ABNORMAL HIGH (ref 11.4–15.2)

## 2023-04-26 LAB — MRSA NEXT GEN BY PCR, NASAL: MRSA by PCR Next Gen: DETECTED — AB

## 2023-04-26 MED ORDER — SODIUM CHLORIDE 0.9 % IV SOLN
250.0000 mL | INTRAVENOUS | Status: DC
Start: 2023-04-26 — End: 2023-04-27
  Administered 2023-04-26: 250 mL via INTRAVENOUS

## 2023-04-26 MED ORDER — ROPINIROLE HCL ER 4 MG PO TB24: 4.0000 mg | ORAL_TABLET | Freq: Every day | ORAL | Status: DC

## 2023-04-26 MED ORDER — ATORVASTATIN CALCIUM 10 MG PO TABS
20.0000 mg | ORAL_TABLET | Freq: Every day | ORAL | Status: DC
  Administered 2023-04-27 – 2023-05-02 (×6): 20 mg via ORAL
  Filled 2023-04-26 (×6): qty 2

## 2023-04-26 MED ORDER — MIDODRINE HCL 5 MG PO TABS
5.0000 mg | ORAL_TABLET | Freq: Three times a day (TID) | ORAL | Status: DC
  Administered 2023-04-26 – 2023-05-01 (×15): 5 mg via ORAL
  Filled 2023-04-26 (×13): qty 1

## 2023-04-26 MED ORDER — LACTATED RINGERS IV SOLN: INTRAVENOUS | Status: AC

## 2023-04-26 MED ORDER — HEPARIN SODIUM (PORCINE) 5000 UNIT/ML IJ SOLN: 5000.0000 [IU] | Freq: Three times a day (TID) | INTRAMUSCULAR | Status: DC

## 2023-04-26 MED ORDER — NOREPINEPHRINE 4 MG/250ML-% IV SOLN: 2.0000 ug/min | INTRAVENOUS | Status: DC

## 2023-04-26 MED ORDER — AMIODARONE HCL 200 MG PO TABS
200.0000 mg | ORAL_TABLET | Freq: Every day | ORAL | Status: DC
  Administered 2023-04-27 – 2023-05-02 (×5): 200 mg via ORAL
  Filled 2023-04-26 (×6): qty 1

## 2023-04-26 MED ORDER — LACTATED RINGERS IV BOLUS (SEPSIS)
1000.0000 mL | Freq: Once | INTRAVENOUS | Status: AC
  Administered 2023-04-26: 1000 mL via INTRAVENOUS

## 2023-04-26 MED ORDER — ACETAMINOPHEN 500 MG PO TABS
1000.0000 mg | ORAL_TABLET | Freq: Once | ORAL | Status: AC
  Administered 2023-04-26: 1000 mg via ORAL
  Filled 2023-04-26: qty 2

## 2023-04-26 MED ORDER — NOREPINEPHRINE 4 MG/250ML-% IV SOLN
0.0000 ug/min | INTRAVENOUS | Status: DC
  Administered 2023-04-26: 2 ug/min via INTRAVENOUS
  Filled 2023-04-26: qty 250

## 2023-04-26 MED ORDER — METRONIDAZOLE 500 MG/100ML IV SOLN
500.0000 mg | Freq: Once | INTRAVENOUS | Status: AC
  Administered 2023-04-26: 500 mg via INTRAVENOUS
  Filled 2023-04-26: qty 100

## 2023-04-26 MED ORDER — PIPERACILLIN-TAZOBACTAM 3.375 G IVPB
3.3750 g | Freq: Three times a day (TID) | INTRAVENOUS | Status: DC
  Administered 2023-04-26 – 2023-05-01 (×14): 3.375 g via INTRAVENOUS
  Filled 2023-04-26 (×14): qty 50

## 2023-04-26 MED ORDER — INSULIN ASPART 100 UNIT/ML IJ SOLN
0.0000 [IU] | INTRAMUSCULAR | Status: DC
  Administered 2023-04-26: 2 [IU] via SUBCUTANEOUS
  Administered 2023-04-27 – 2023-05-01 (×3): 1 [IU] via SUBCUTANEOUS

## 2023-04-26 MED ORDER — MUPIROCIN 2 % EX OINT
1.0000 | TOPICAL_OINTMENT | Freq: Two times a day (BID) | CUTANEOUS | Status: AC
  Administered 2023-04-26 – 2023-05-01 (×9): 1 via NASAL
  Filled 2023-04-26 (×4): qty 22

## 2023-04-26 MED ORDER — LACTATED RINGERS IV BOLUS
500.0000 mL | Freq: Once | INTRAVENOUS | Status: AC
  Administered 2023-04-26: 500 mL via INTRAVENOUS

## 2023-04-26 MED ORDER — IOHEXOL 350 MG/ML SOLN
75.0000 mL | Freq: Once | INTRAVENOUS | Status: AC | PRN
  Administered 2023-04-26: 75 mL via INTRAVENOUS

## 2023-04-26 MED ORDER — CHOLESTYRAMINE LIGHT 4 G PO PACK
4.0000 g | PACK | Freq: Two times a day (BID) | ORAL | Status: DC
Start: 2023-04-27 — End: 2023-05-02
  Administered 2023-04-27 – 2023-05-02 (×9): 4 g via ORAL
  Filled 2023-04-26 (×12): qty 1

## 2023-04-26 MED ORDER — VANCOMYCIN HCL IN DEXTROSE 1-5 GM/200ML-% IV SOLN
1000.0000 mg | INTRAVENOUS | Status: DC
  Administered 2023-04-26: 1000 mg via INTRAVENOUS
  Filled 2023-04-26: qty 200

## 2023-04-26 MED ORDER — CALCIUM GLUCONATE-NACL 2-0.675 GM/100ML-% IV SOLN
2.0000 g | Freq: Once | INTRAVENOUS | Status: AC
  Administered 2023-04-26: 2000 mg via INTRAVENOUS
  Filled 2023-04-26: qty 100

## 2023-04-26 MED ORDER — DIPHENOXYLATE-ATROPINE 2.5-0.025 MG PO TABS: 2.0000 | ORAL_TABLET | Freq: Four times a day (QID) | ORAL | Status: DC | PRN

## 2023-04-26 MED ORDER — LEVOTHYROXINE SODIUM 75 MCG PO TABS
75.0000 ug | ORAL_TABLET | Freq: Every day | ORAL | Status: DC
  Administered 2023-04-27 – 2023-05-02 (×6): 75 ug via ORAL
  Filled 2023-04-26: qty 1
  Filled 2023-04-26: qty 3
  Filled 2023-04-26 (×5): qty 1

## 2023-04-26 MED ORDER — POLYETHYLENE GLYCOL 3350 17 G PO PACK: 17.0000 g | PACK | Freq: Every day | ORAL | Status: DC | PRN

## 2023-04-26 MED ORDER — SODIUM CHLORIDE 0.9 % IV SOLN
2.0000 g | Freq: Once | INTRAVENOUS | Status: AC
  Administered 2023-04-26: 2 g via INTRAVENOUS
  Filled 2023-04-26: qty 12.5

## 2023-04-26 MED ORDER — APIXABAN 2.5 MG PO TABS
2.5000 mg | ORAL_TABLET | Freq: Two times a day (BID) | ORAL | Status: DC
  Administered 2023-04-27 – 2023-05-01 (×10): 2.5 mg via ORAL
  Filled 2023-04-26 (×10): qty 1

## 2023-04-26 MED ORDER — ROPINIROLE HCL 0.5 MG PO TABS
0.5000 mg | ORAL_TABLET | Freq: Three times a day (TID) | ORAL | Status: DC
  Administered 2023-04-26 (×2): 0.5 mg via ORAL
  Filled 2023-04-26 (×4): qty 1

## 2023-04-26 MED ORDER — CHLORHEXIDINE GLUCONATE CLOTH 2 % EX PADS
6.0000 | MEDICATED_PAD | Freq: Every day | CUTANEOUS | Status: AC
  Administered 2023-04-27 – 2023-05-01 (×4): 6 via TOPICAL

## 2023-04-26 MED ORDER — DOCUSATE SODIUM 100 MG PO CAPS
100.0000 mg | ORAL_CAPSULE | Freq: Two times a day (BID) | ORAL | Status: DC | PRN
Start: 2023-04-26 — End: 2023-05-02

## 2023-04-26 NOTE — ED Notes (Signed)
Port XR at bedside.

## 2023-04-26 NOTE — ED Notes (Addendum)
Metzger MD at bedside

## 2023-04-26 NOTE — Progress Notes (Signed)
Pharmacy Antibiotic Note  Fernando Young is a 82 y.o. male admitted on 04/26/2023 with sepsis secondary to pna vs IAI. Pt had recent admission requiring parenteral antibiotics and has had difficult recovering physically. Currently afebrile, WBC 176 secondary to h/o CLL, requiring low dose norepinephrine for MAP >65 at this time. Pharmacy has been consulted for vanc/zosyn dosing.  Plan: Zosyn 3.375g IV q8h (4 hour infusion). Vancomycin 1g q24h  (eAUC 538.4, associated Vt 13.4) Monitor renal function for dose adjustments F/u source of infection, duration of therapy, deescalation as able  Height: 6' (182.9 cm) Weight: 59.9 kg (132 lb) IBW/kg (Calculated) : 77.6  Temp (24hrs), Avg:99.2 F (37.3 C), Min:98.3 F (36.8 C), Max:101.1 F (38.4 C)  Recent Labs  Lab 04/26/23 0730 04/26/23 0742 04/26/23 0842 04/26/23 1016 04/26/23 1020  WBC 176.0*  --   --   --   --   CREATININE 1.12  --  1.02  --  1.00  LATICACIDVEN  --  1.1  --  0.7  --     Estimated Creatinine Clearance: 46.6 mL/min (by C-G formula based on SCr of 1 mg/dL).    No Known Allergies  Antimicrobials this admission: Cefepime 12/25 0753 x1 Flagyl 12/25 0825 x1 Zosyn 12/25 >> Vanc 12/25 >>  Microbiology results: 12/25 BCx:  12/25 MRSA PCR:   Thank you for allowing pharmacy to be a part of this patient's care.  Rutherford Nail, PharmD PGY2 Critical Care Pharmacy Resident 04/26/2023 12:36 PM

## 2023-04-26 NOTE — Sepsis Progress Note (Signed)
[facility]link will follow per sepsis protocol  

## 2023-04-26 NOTE — ED Provider Notes (Signed)
[place] EMERGENCY DEPARTMENT AT [facility] Provider Note   CSN: [id number] Arrival date & time: 04/26/23  4696     History  No chief complaint on file.   Fernando Young is a 82 y.o. male.  The history is provided by the patient, the EMS personnel and medical records. No language interpreter was used.     82 year old male significant history of diabetes, CHF, hypertension, leukemia, iron deficiency anemia brought here via EMS with concerns of sepsis.  Per EMS, patient was brought from home.  Patient has been endorsing diarrhea as well as coughing up discolored phlegm for the past several days.  Reported a tympanic temperature of 103 at the scene.  Patient report he coughed up some phlegm 4 days ago and has been dry heaving with decrease in appetite since.  Last night he felt cold and chills and having some loose stool.  He lives with his daughter and daughter called to have patient brought here.  Patient otherwise denies any headache, runny nose, sneezing, sore throat, urinary symptoms.  Home Medications Prior to Admission medications   Medication Sig Start Date End Date Taking? Authorizing Provider  acetaminophen (TYLENOL) 500 MG tablet Take 1,000 mg by mouth every 6 (six) hours as needed for mild pain (pain score 1-3) or fever.    [provider]  amiodarone (PACERONE) 200 MG tablet Take 200 mg by mouth daily. 03/10/20   [provider]  apixaban (ELIQUIS) 5 MG TABS tablet Take 5 mg by mouth 2 (two) times daily. 02/09/22   [provider]  Ascorbic Acid (VITAMIN C) 500 MG CAPS Take 500 mg by mouth daily.    [provider]  atorvastatin (LIPITOR) 20 MG tablet Take 20 mg by mouth at bedtime. 03/31/23   [provider]  bismuth subsalicylate (PEPTO BISMOL) 262 MG/15ML suspension Take 30 mLs by mouth every 6 (six) hours as needed for diarrhea or loose stools or indigestion. 01/05/22   [provider]  cholecalciferol  (VITAMIN D3) 25 MCG (1000 UNIT) tablet Take 1,000 Units by mouth daily.    [provider]  cholestyramine (QUESTRAN) 4 g packet Take 4 g by mouth daily. 10/19/22   [provider]  diphenoxylate-atropine (LOMOTIL) 2.5-0.025 MG tablet Take 1 tablet by mouth See admin instructions. Take 2 tablets by mouth in the morning and an additional 1 tablet up to twice a day as needed for loose stools 11/28/22   [provider]  doxycycline (VIBRA-TABS) 100 MG tablet Take 1 tablet (100 mg total) by mouth every 12 (twelve) hours. 04/08/23   Marinda Elk, MD  furosemide (LASIX) 40 MG tablet Take 0.5 tablets (20 mg total) by mouth daily. Patient not taking: Reported on 04/03/2023 02/22/22   Marguerita Merles Latif, DO  levothyroxine (SYNTHROID) 75 MCG tablet Take 75 mcg by mouth daily before breakfast. 12/06/21   [provider]  metFORMIN (GLUCOPHAGE-XR) 500 MG 24 hr tablet Take 500 mg by mouth 2 (two) times daily. 12/03/20   [provider]  midodrine (PROAMATINE) 5 MG tablet TAKE 1 TABLET (5 MG TOTAL) BY MOUTH 3 (THREE) TIMES DAILY WITH MEALS. 11/01/22   Dellia Beckwith, MD  nitroGLYCERIN (NITROSTAT) 0.4 MG SL tablet Place 0.4 mg under the tongue every 5 (five) minutes as needed for chest pain.    [provider]  rOPINIRole (REQUIP) 1 MG tablet Take 1 mg by mouth at bedtime. 03/31/23   [provider]  Wheat Dextrin (BENEFIBER) POWD  Take 4 g by mouth See admin instructions. Mix 4 grams (1 teaspoonful) into a cup of coffee and drink every morning    [provider]      Allergies    Patient has no known allergies.    Review of Systems   Review of Systems  All other systems reviewed and are negative.   Physical Exam Updated Vital Signs BP 126/76   Pulse 95   Temp 98.3 F (36.8 C) (Oral)   Resp (!) 25   Ht 6' (1.829 m)   Wt 62.8 kg   SpO2 98%   BMI 18.78 kg/m  Physical Exam Vitals and nursing note reviewed.  Constitutional:       General: He is not in acute distress.    Appearance: He is well-developed.     Comments: Elderly cachectic male laying in bed appears slightly uncomfortable but nontoxic.  HENT:     Head: Atraumatic.     Mouth/Throat:     Mouth: Mucous membranes are dry.  Eyes:     Conjunctiva/sclera: Conjunctivae normal.  Neck:     Comments: No nuchal rigidity Cardiovascular:     Rate and Rhythm: Tachycardia present.  Pulmonary:     Breath sounds: Rales (Crackles heard to the left lower lung base no wheezes or rhonchi) present.  Abdominal:     Palpations: Abdomen is soft.     Tenderness: There is abdominal tenderness (Tenderness to right side of abdomen without guarding or rebound tenderness.).  Musculoskeletal:     Cervical back: Normal range of motion and neck supple.     Right lower leg: Edema present.     Left lower leg: Edema present.     Comments: 2+ pitting edema to bilateral lower extremities extending towards the knees  Skin:    Findings: No rash.  Neurological:     Mental Status: He is alert and oriented to person, place, and time.     ED Results / Procedures / Treatments   Labs (all labs ordered are listed, but only abnormal results are displayed) Labs Reviewed  COMPREHENSIVE METABOLIC PANEL - Abnormal; Notable for the following components:      Result Value   Potassium >7.5 (*)    Glucose, Bld 133 (*)    BUN 26 (*)    Calcium 7.8 (*)    Total Protein 5.7 (*)    Albumin 3.0 (*)    Total Bilirubin 1.2 (*)    All other components within normal limits  CBC WITH DIFFERENTIAL/PLATELET - Abnormal; Notable for the following components:   WBC 176.0 (*)    RBC 3.42 (*)    Hemoglobin 9.7 (*)    HCT 33.4 (*)    MCHC 29.0 (*)    RDW 16.2 (*)    Lymphs Abs 167.2 (*)    Monocytes Absolute 1.8 (*)    Eosinophils Absolute 1.8 (*)    Basophils Absolute 1.8 (*)    All other components within normal limits  PROTIME-INR - Abnormal; Notable for the following components:    Prothrombin Time 20.7 (*)    INR 1.8 (*)    All other components within normal limits  BASIC METABOLIC PANEL - Abnormal; Notable for the following components:   Potassium 6.7 (*)    Glucose, Bld 116 (*)    BUN 24 (*)    Calcium 7.5 (*)    All other components within normal limits  I-STAT CHEM 8, ED - Abnormal; Notable for the following components:  BUN 26 (*)    Glucose, Bld 105 (*)    Calcium, Ion 0.98 (*)    Hemoglobin 9.2 (*)    HCT 27.0 (*)    All other components within normal limits  RESP PANEL BY RT-PCR (RSV, FLU A&B, COVID)  RVPGX2  CULTURE, BLOOD (ROUTINE X 2)  CULTURE, BLOOD (ROUTINE X 2)  APTT  URINALYSIS, W/ REFLEX TO CULTURE (INFECTION SUSPECTED)  I-STAT CG4 LACTIC ACID, ED  I-STAT CG4 LACTIC ACID, ED    EKG EKG Interpretation Date/Time:  Wednesday April 26 2023 07:17:59 EST Ventricular Rate:  109 PR Interval:  136 QRS Duration:  165 QT Interval:  393 QTC Calculation: 530 R Axis:   270  Text Interpretation: 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 Sepsis. History of prostate cancer. * Tracking Code: BO * EXAM: CT ABDOMEN AND PELVIS WITH CONTRAST TECHNIQUE: Multidetector CT imaging of the abdomen and pelvis was performed using the standard protocol following bolus administration of intravenous contrast. RADIATION DOSE REDUCTION: This exam was performed according to the departmental dose-optimization program which includes automated exposure control, adjustment of the mA and/or kV according to patient size and/or use of iterative reconstruction technique. CONTRAST:  75mL OMNIPAQUE IOHEXOL 350 MG/ML SOLN COMPARISON:  CT of the abdomen and pelvis 04/07/2023. FINDINGS: Lower chest: Extensive bibasilar areas of scarring and/or atelectasis, similar  to the prior examination. Atherosclerotic calcifications are noted in the descending thoracic aorta as well as the left anterior descending and right coronary arteries. Hepatobiliary: No suspicious cystic or solid hepatic lesions. No intra or extrahepatic biliary ductal dilatation. Diffuse periportal edema is noted. Gallbladder is moderately distended. Amorphous intermediate attenuation material lying dependently in the proximal gallbladder. No definite pericholecystic fluid or surrounding inflammatory changes. Pancreas: No pancreatic mass. No pancreatic ductal dilatation. No pancreatic or peripancreatic fluid collections or inflammatory changes. Spleen: Spleen is enlarged measuring 12.8 x 7.2 x 16.8 cm (estimated splenic volume of 774 mL). Adrenals/Urinary Tract: Tiny 2-3 mm nonobstructive calculi in the lower pole collecting system of the left kidney. Subcentimeter low-attenuation lesion in the lower pole of the right kidney, too small to characterize, but statistically likely a cyst (no imaging follow-up recommended). Bilateral adrenal glands are unremarkable in appearance. No hydroureteronephrosis. Urinary bladder is unremarkable in appearance. Stomach/Bowel: The appearance of the stomach is unremarkable. No pathologic dilatation of small bowel or colon. Large left inguinal scrotal hernia incompletely imaged containing a long segment of the proximal sigmoid colon, similar to the prior study. Again noted is a radiopaque foreign body estimated to measure approximately 4.4 x 0.9 cm in the region of the distal ileum. The surrounding small bowel wall appears thickened and inflamed best appreciated on axial image 51 of series 3. Appendix is not confidently identified, potentially surgically absent. Vascular/Lymphatic: Atherosclerotic calcifications in the abdominal aorta and pelvic vasculature, without evidence of aneurysm or dissection. No definite lymphadenopathy noted in the abdomen or pelvis. Reproductive:  Brachytherapy implants in the prostate gland. Other: No significant volume of ascites.  No pneumoperitoneum. Musculoskeletal: There are no aggressive appearing lytic or blastic lesions noted in the visualized portions of the skeleton. IMPRESSION: 1. Elongated radiopaque foreign body in the distal ileum, with extensive surrounding mural thickening and inflammatory changes in the adjacent small bowel. This foreign body is of uncertain etiology, but was evident on  the prior examination from 04/07/2023. The mural thickening and inflammatory changes have clearly increased compared to the prior examination. 2. 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 3. Extensive periportal edema in the liver. This is a nonspecific finding, but correlation with liver function tests is recommended. 4. 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 5. Aortic atherosclerosis, in addition to at least 2 vessel coronary artery disease. 6. Additional incidental findings, as above. Electronically Signed   By: Trudie Reed M.D.   On: 04/26/2023 09:25   DG Chest Port 1 View Result Date: 04/26/2023 CLINICAL DATA:  82 year old male with possible sepsis. EXAM: PORTABLE CHEST 1 VIEW COMPARISON:  Chest x-ray 04/02/2023. FINDINGS: Right internal jugular single-lumen Port-A-Cath with tip terminating at the superior cavoatrial junction. Lung volumes are low. Patchy areas of interstitial prominence an ill-defined opacities are scattered throughout the lungs bilaterally, most evident in the lung bases. No pleural effusions. No pneumothorax. No evidence of pulmonary edema. Heart size appears borderline enlarged. Mediastinal contours are distorted by patient positioning. IMPRESSION: 1. The appearance of the lungs is concerning for multilobar bilateral bronchopneumonia. Given the basal predominance of the findings, clinical  correlation for signs and symptoms of aspiration pneumonia is suggested. 2. Aortic atherosclerosis. Electronically Signed   By: Trudie Reed M.D.   On: 04/26/2023 07:59    Procedures .Critical Care  Performed by: Fayrene Helper, PA-C Authorized by: Fayrene Helper, PA-C   Critical care provider statement:    Critical care time (minutes):  40   Critical care was time spent personally by me on the following activities:  Development of treatment plan with patient or surrogate, discussions with consultants, evaluation of patient's response to treatment, examination of patient, ordering and review of laboratory studies, ordering and review of radiographic studies, ordering and performing treatments and interventions, pulse oximetry, re-evaluation of patient's condition and review of old charts     Medications Ordered in ED Medications  lactated ringers infusion ( Intravenous New Bag/Given 04/26/23 0832)  norepinephrine (LEVOPHED) 4mg  in (0.016 mg/mL) premix infusion (has no administration in time range)  lactated ringers bolus 1,000 mL (0 mLs Intravenous Stopped 04/26/23 0833)    And  lactated ringers bolus 1,000 mL (0 mLs Intravenous Stopped 04/26/23 0843)  ceFEPIme (MAXIPIME) 2 g in sodium chloride 0.9 % 100 mL IVPB (0 g Intravenous Stopped 04/26/23 0825)  metroNIDAZOLE (FLAGYL) IVPB 500 mg (0 mg Intravenous Stopped 04/26/23 0935)  acetaminophen (TYLENOL) tablet 1,000 mg (1,000 mg Oral Given 04/26/23 0730)  iohexol (OMNIPAQUE) 350 MG/ML injection 75 mL (75 mLs Intravenous Contrast Given 04/26/23 1610)    ED Course/ Medical Decision Making/ A&P                                 Medical Decision Making Amount and/or Complexity of Data Reviewed Labs: ordered. Radiology: ordered. ECG/medicine tests: ordered.  Risk OTC drugs. Prescription drug management.   BP 130/74 (BP Location: Right Arm)   Pulse (!) 112   Temp 98.3 F (36.8 C) (Oral)   Resp 20   Ht 6' (1.829 m)   Wt 62.8  kg   SpO2 91%   BMI 18.78 kg/m   47:71 AM  82 year old male significant history of diabetes, CHF, hypertension, leukemia, iron deficiency anemia brought here via EMS with concerns of sepsis.  Per EMS, patient was brought from  home.  Patient has been endorsing diarrhea as well as coughing up discolored phlegm for the past several days.  Reported a tympanic temperature of 103 at the scene.  Patient report he coughed up some phlegm 4 days ago and has been dry heaving with decrease in appetite since.  Last night he felt cold and chills and having some loose stool.  He lives with his daughter and daughter called to have patient brought here.  Patient otherwise denies any headache, runny nose, sneezing, sore throat, urinary symptoms.  Exam notable for crackles to the left lower lung base and tenderness to the right side of the abdomen on elderly cachectic male with multiple comorbidities.  Initial vitals are notable for tachycardia, febrile, and patient satting at 89% on room air.  2 L of supplemental oxygen was given, code sepsis have been initiated, due to having abdominal discomfort, suspect intra-abdominal source, will provide appropriate antibiotics.  EMR reviewed, patient was last admitted from 12/1 and discharged on 12/7 due to septic shock requiring vasopressor and broad-spectrum antibiotic.  He was subsequently diagnosed with pneumonia.  History of CHF with an EF of 30%.  History of A-fib currently on Eliquis.  -Labs ordered, independently viewed and interpreted by me.  Labs remarkable for initial K+ of >7.5 without EKG changes.  Likely hemolysis.  Recheck K+ is 6.7,  will obtain istat chem 8 to verify prior to treatment.  WBC 176, similar to previous. Repeat K+ is within normal limit.  Doubt true hyperkalemia. -The patient was maintained on a cardiac monitor.  I personally viewed and interpreted the cardiac monitored which showed an underlying rhythm of sinus tachycardia -Imaging independently  viewed and interpreted by me and I agree with radiologist's interpretation.  Result remarkable for CXR showing multifocal pna.  Abd/pelvis CT showing fb in the distal ileum with surrounding inflammatory changes, worse than prior.  I have consulted General Surgery team who will be involve in pt care.  -This patient presents to the ED for concern of fever, this involves an extensive number of treatment options, and is a complaint that carries with it a high risk of complications and morbidity.  The differential diagnosis includes sepsis, pneumonia, appendicitis, colitis, pancreatitis, cholecystitis, UTI, cellulitis, meningitis -Co morbidities that complicate the patient evaluation includes leukemia, recent pneumonia,  -Treatment includes sepsis protocol.  Fluid resuscitation at 34ml/kg.  Unfortunately BP did not improve, requiring Levophed vasopressor.  Abx including Cefepime and Metronidazole -Reevaluation of the patient after these medicines showed that the patient stayed the same -PCP office notes or outside notes reviewed -Discussion with attending Dr. Hyacinth Meeker. Given abdominal finding General Surgery team will be involve in pt care.  I also appreciate consultation from Critical care team who will see and will admit pt for further work up.  -Escalation to admission/observation considered: patient is amenable for admission.         Final Clinical Impression(s) / ED Diagnoses Final diagnoses:  Sepsis, due to unspecified organism, unspecified whether acute organ dysfunction present [facility])  Multifocal pneumonia    Rx / DC Orders ED Discharge Orders     None         Fayrene Helper, PA-C 04/26/23 1041    Eber Hong, MD 04/27/23 1710

## 2023-04-26 NOTE — ED Notes (Signed)
PA aware of most recent blood pressure reading, to recheck when patient returns from CT.

## 2023-04-26 NOTE — Progress Notes (Signed)
ED Pharmacy Antibiotic Sign Off An antibiotic consult was received from an ED provider for cefepime per pharmacy dosing for intra-abdominal infectoin. A chart review was completed to assess appropriateness.   The following one time order(s) were placed:  Cefepime 2g x1   Further antibiotic and/or antibiotic pharmacy consults should be ordered by the admitting provider if indicated.   Thank you for allowing pharmacy to be a part of this patient's care.   Estill Batten, PharmD, BCCCP  Clinical Pharmacist 04/26/23 7:30 AM

## 2023-04-26 NOTE — ED Triage Notes (Addendum)
Patient brought in by [facility] from home, patient endorses x4 episodes of diarrhea. Patient woke up thought he would vomit but coughed up discolored phlegm. Temp of 102 at home by daughter. Been home x2 weeks with pneumonia and hypotension from hospital. Alert and oriented x4. Right sided abdominal tenderness.   EMS vitals  Tympanic 103 BP 128/74 Sinus tach  [place] 37

## 2023-04-26 NOTE — ED Notes (Signed)
Lab called this RN to notify of critical potassium with hemolysis. RN to recollect.

## 2023-04-26 NOTE — Consult Note (Signed)
Fernando Young January 20, 1941  [id number].    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  He underwent a CT AP that showed a FB in the distal ileum with associated mural thickening and inflammation, a large left scrotal hernia w/o evidence of obstruction, and extensive periportal edema.  Of note, he was recently admitted from 12/1-12/7 after presenting with sepsis and ultimately found to have PNA.  He reports that after discharge he never felt fully recovered and continued to experience general malaise, anorexia, and a productive cough.  He reports having diarrhea every few days, which his daughter says has been going on for several months. He does have regular BM in between bouts of diarrhea. He has a CT early December showing the same FB in the distal ileum.  He was also admitted to [facility] in November for a SBO. CT report from that visit also mentions the FB with surrounding inflammation (no images available).   On exam, patient resting comfortably. NAD  ROS: Review of Systems  Constitutional:  Positive for fever and malaise/fatigue.  HENT: Negative.    Eyes: Negative.   Respiratory:  Positive for cough, sputum production and shortness of breath.   Cardiovascular: Negative.   Gastrointestinal:  Positive for abdominal pain and nausea.       Anorexia  Genitourinary: Negative.   Musculoskeletal: Negative.   Skin: Negative.   Neurological: Negative.   Endo/Heme/Allergies: Negative.   Psychiatric/Behavioral: Negative.      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 He has never used smokeless tobacco. He reports that he does not currently use alcohol. He reports that he does not use drugs.  Allergies: No Known Allergies  (Not in a hospital admission)   Physical Exam: Blood pressure 118/66, pulse 85, temperature (!) 101.1 F (38.4 C), temperature source Rectal, resp. rate 18, height 6' (1.829 m), weight 59.9 kg, SpO2 98%. Gen: elderly male, NAD Resp: [place] in place, equal chest rise, port in place CV: on levo Abd: soft, non-distended, non-tender Groin: large left scrotal hernia, freely mobile but not able to fully reduce, no overlying skin changes, non tender to palpation  Neuro:  moving all extremities  Results for orders placed or performed during the hospital encounter of 04/26/23 (from the past 48 hours)  Resp panel by RT-PCR (RSV, Flu A&B, Covid) Anterior Nasal Swab     Status: None   Collection Time: 04/26/23  7:18  AM   Specimen: Anterior Nasal Swab  Result Value Ref Range   SARS Coronavirus 2 by RT PCR NEGATIVE NEGATIVE   Influenza A by PCR NEGATIVE NEGATIVE   Influenza B by PCR NEGATIVE NEGATIVE    Comment: (NOTE) The Xpert Xpress SARS-CoV-2/FLU/RSV plus assay is intended as an aid in the diagnosis of influenza from Nasopharyngeal swab specimens and should not be used as a sole basis for treatment. Nasal washings and aspirates are unacceptable for Xpert Xpress SARS-CoV-2/FLU/RSV testing.  Fact Sheet for Patients: BloggerCourse.com  Fact Sheet  for Healthcare Providers: SeriousBroker.it  This test is not yet approved or cleared by the [place] FDA and has been authorized for detection and/or diagnosis of SARS-CoV-2 by FDA under an Emergency Use Authorization (EUA). This EUA will remain in effect (meaning this test can be used) for the duration of the COVID-19 declaration under Section 564(b)(1) of the Act, 21 U.S.C. section 360bbb-3(b)(1), unless the authorization is terminated or revoked.     Resp Syncytial Virus by PCR NEGATIVE NEGATIVE    Comment: (NOTE) Fact Sheet for Patients: BloggerCourse.com  Fact Sheet for Healthcare Providers: SeriousBroker.it  This test is not yet approved or cleared by the [place] FDA and has been authorized for detection and/or diagnosis of SARS-CoV-2 by FDA under an Emergency Use Authorization (EUA). This EUA will remain in effect (meaning this test can be used) for the duration of the COVID-19 declaration under Section 564(b)(1) of the Act, 21 U.S.C. section 360bbb-3(b)(1), unless the authorization is terminated or revoked.  Performed at [facility], [address], [place], [place] [address]   Comprehensive metabolic panel     Status: Abnormal   Collection Time: 04/26/23  7:30 AM  Result Value Ref Range   Sodium 135 135 - 145 mmol/L   Potassium >7.5 (HH) 3.5 - 5.1 mmol/L    Comment: HEMOLYSIS AT THIS LEVEL MAY AFFECT RESULT CRITICAL RESULT CALLED TO, READ BACK BY AND VERIFIED WITH Rexene Edison MCNEILL, RN [phone] 04/26/23 L. KLAR    Chloride 99 98 - 111 mmol/L   CO2 25 22 - 32 mmol/L   Glucose, Bld 133 (H) 70 - 99 mg/dL    Comment: Glucose reference range applies only to samples taken after fasting for at least 8 hours.   BUN 26 (H) 8 - 23 mg/dL   Creatinine, Ser 9.62 0.61 - 1.24 mg/dL   Calcium 7.8 (L) 8.9 - 10.3 mg/dL   Total Protein 5.7 (L) 6.5 - 8.1 g/dL   Albumin 3.0 (L) 3.5 - 5.0 g/dL   AST 39  15 - 41 U/L    Comment: HEMOLYSIS AT THIS LEVEL MAY AFFECT RESULT   ALT 15 0 - 44 U/L    Comment: HEMOLYSIS AT THIS LEVEL MAY AFFECT RESULT   Alkaline Phosphatase 64 38 - 126 U/L   Total Bilirubin 1.2 (H) <1.2 mg/dL    Comment: HEMOLYSIS AT THIS LEVEL MAY AFFECT RESULT   GFR, Estimated >60 >60 mL/min    Comment: (NOTE) Calculated using the CKD-EPI Creatinine Equation (2021)    Anion gap 11 5 - 15    Comment: Performed at [facility], [address], [place], [place] [address]  CBC with Differential     Status: Abnormal   Collection Time: 04/26/23  7:30 AM  Result Value Ref Range   WBC 176.0 (HH) 4.0 - 10.5 K/uL    Comment: REPEATED TO VERIFY THIS CRITICAL RESULT HAS VERIFIED AND BEEN CALLED TO [place] MCNEIL,RN  BY ZELDA BEECH ON 12 25 2024 AT 0818, AND HAS BEEN READ BACK.     RBC 3.42 (L) 4.22 - 5.81 MIL/uL   Hemoglobin 9.7 (L) 13.0 - 17.0 g/dL   HCT 29.5 (L) 28.4 - 13.2 %   MCV 97.7 80.0 - 100.0 fL   MCH 28.4 26.0 - 34.0 pg   MCHC 29.0 (L) 30.0 - 36.0 g/dL   RDW 44.0 (H) 10.2 - 72.5 %   Platelets 274 150 - 400 K/uL   nRBC 0.0 0.0 - 0.2 %   Neutrophils Relative % 2 %   Neutro Abs 3.5 1.7 - 7.7 K/uL   Lymphocytes Relative 95 %   Lymphs Abs 167.2 (H) 0.7 - 4.0 K/uL   Monocytes Relative 1 %   Monocytes Absolute 1.8 (H) 0.1 - 1.0 K/uL   Eosinophils Relative 1 %   Eosinophils Absolute 1.8 (H) 0.0 - 0.5 K/uL   Basophils Relative 1 %   Basophils Absolute 1.8 (H) 0.0 - 0.1 K/uL   WBC Morphology ABSOLUTE LYMPHOCYTOSIS     Comment: SMUDGE CELLS   RBC Morphology See Note    Smear Review Normal platelet morphology    Abs Immature Granulocytes 0.00 0.00 - 0.07 K/uL   Burr Cells PRESENT     Comment: Performed at [facility], [address] [place], [place] [address]  Protime-INR     Status: Abnormal   Collection Time: 04/26/23  7:30 AM  Result Value Ref Range   Prothrombin Time 20.7 (H) 11.4 - 15.2 seconds   INR 1.8 (H) 0.8 - 1.2    Comment: (NOTE) INR goal varies  based on device and disease states. Performed at [facility], [address], [place], [place] [address]   APTT     Status: None   Collection Time: 04/26/23  7:30 AM  Result Value Ref Range   aPTT 34 24 - 36 seconds    Comment: Performed at [facility], [address], [place], [place] [address]  I-Stat Lactic Acid, ED     Status: None   Collection Time: 04/26/23  7:42 AM  Result Value Ref Range   Lactic Acid, Venous 1.1 0.5 - 1.9 mmol/L  Basic metabolic panel     Status: Abnormal   Collection Time: 04/26/23  8:42 AM  Result Value Ref Range   Sodium 135 135 - 145 mmol/L   Potassium 6.7 (HH) 3.5 - 5.1 mmol/L    Comment: HEMOLYSIS AT THIS LEVEL MAY AFFECT RESULT CRITICAL RESULT CALLED TO, READ BACK BY AND VERIFIED WITH Rexene Edison MCNEILL, RN [phone] 04/26/23 L. KLAR    Chloride 101 98 - 111 mmol/L   CO2 24 22 - 32 mmol/L   Glucose, Bld 116 (H) 70 - 99 mg/dL    Comment: Glucose reference range applies only to samples taken after fasting for at least 8 hours.   BUN 24 (H) 8 - 23 mg/dL   Creatinine, Ser 6.43 0.61 - 1.24 mg/dL   Calcium 7.5 (L) 8.9 - 10.3 mg/dL   GFR, Estimated >32 >95 mL/min    Comment: (NOTE) Calculated using the CKD-EPI Creatinine Equation (2021)    Anion gap 10 5 - 15    Comment: Performed at [facility], [address], [place], [place] [address]  I-Stat Lactic Acid, ED     Status: None   Collection Time: 04/26/23 10:16 AM  Result Value Ref Range   Lactic Acid, Venous 0.7 0.5 - 1.9  mmol/L   CT ABDOMEN PELVIS W CONTRAST Result Date: 04/26/2023 CLINICAL DATA:  82 year old male with history of abdominal pain. Sepsis. History of prostate cancer. * Tracking Code: BO * EXAM: CT ABDOMEN AND PELVIS WITH CONTRAST TECHNIQUE: Multidetector CT imaging of the abdomen and pelvis was performed using the standard protocol following bolus administration of intravenous contrast. RADIATION DOSE REDUCTION: This exam was performed according to the departmental  dose-optimization program which includes automated exposure control, adjustment of the mA and/or kV according to patient size and/or use of iterative reconstruction technique. CONTRAST:  75mL OMNIPAQUE IOHEXOL 350 MG/ML SOLN COMPARISON:  CT of the abdomen and pelvis 04/07/2023. FINDINGS: Lower chest: Extensive bibasilar areas of scarring and/or atelectasis, similar to the prior examination. Atherosclerotic calcifications are noted in the descending thoracic aorta as well as the left anterior descending and right coronary arteries. Hepatobiliary: No suspicious cystic or solid hepatic lesions. No intra or extrahepatic biliary ductal dilatation. Diffuse periportal edema is noted. Gallbladder is moderately distended. Amorphous intermediate attenuation material lying dependently in the proximal gallbladder. No definite pericholecystic fluid or surrounding inflammatory changes. Pancreas: No pancreatic mass. No pancreatic ductal dilatation. No pancreatic or peripancreatic fluid collections or inflammatory changes. Spleen: Spleen is enlarged measuring 12.8 x 7.2 x 16.8 cm (estimated splenic volume of 774 mL). Adrenals/Urinary Tract: Tiny 2-3 mm nonobstructive calculi in the lower pole collecting system of the left kidney. Subcentimeter low-attenuation lesion in the lower pole of the right kidney, too small to characterize, but statistically likely a cyst (no imaging follow-up recommended). Bilateral adrenal glands are unremarkable in appearance. No hydroureteronephrosis. Urinary bladder is unremarkable in appearance. Stomach/Bowel: The appearance of the stomach is unremarkable. No pathologic dilatation of small bowel or colon. Large left inguinal scrotal hernia incompletely imaged containing a long segment of the proximal sigmoid colon, similar to the prior study. Again noted is a radiopaque foreign body estimated to measure approximately 4.4 x 0.9 cm in the region of the distal ileum. The surrounding small bowel wall  appears thickened and inflamed best appreciated on axial image 51 of series 3. Appendix is not confidently identified, potentially surgically absent. Vascular/Lymphatic: Atherosclerotic calcifications in the abdominal aorta and pelvic vasculature, without evidence of aneurysm or dissection. No definite lymphadenopathy noted in the abdomen or pelvis. Reproductive: Brachytherapy implants in the prostate gland. Other: No significant volume of ascites.  No pneumoperitoneum. Musculoskeletal: There are no aggressive appearing lytic or blastic lesions noted in the visualized portions of the skeleton. IMPRESSION: 1. Elongated radiopaque foreign body in the distal ileum, with extensive surrounding mural thickening and inflammatory changes in the adjacent small bowel. This foreign body is of uncertain etiology, but was evident on the prior examination from 04/07/2023. The mural thickening and inflammatory changes have clearly increased compared to the prior examination. 2. 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 3. Extensive periportal edema in the liver. This is a nonspecific finding, but correlation with liver function tests is recommended. 4. 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 5. Aortic atherosclerosis, in addition to at least 2 vessel coronary artery disease. 6. Additional incidental findings, as above. Electronically Signed   By: Trudie Reed M.D.   On: 04/26/2023 09:25   DG Chest Port 1 View Result Date: 04/26/2023 CLINICAL DATA:  82 year old male with possible sepsis. EXAM: PORTABLE CHEST 1 VIEW COMPARISON:  Chest x-ray 04/02/2023. FINDINGS: Right internal jugular single-lumen Port-A-Cath with tip terminating at the  superior cavoatrial junction. Lung volumes are low. Patchy areas of interstitial prominence an ill-defined opacities are scattered throughout the lungs  bilaterally, most evident in the lung bases. No pleural effusions. No pneumothorax. No evidence of pulmonary edema. Heart size appears borderline enlarged. Mediastinal contours are distorted by patient positioning. IMPRESSION: 1. The appearance of the lungs is concerning for multilobar bilateral bronchopneumonia. Given the basal predominance of the findings, clinical correlation for signs and symptoms of aspiration pneumonia is suggested. 2. Aortic atherosclerosis. Electronically Signed   By: Trudie Reed M.D.   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   - I do not believe that the FB in the SB is a major contributor to his presenting symptoms.  Would defer to CCM/medicine regarding management of his PNA and ongoing workup for infection - There is no indication for urgent surgical intervention.  The etiology of the FB is unclear, but it has been present since at least early November when he was treated at [facility] and likely long before (no mention of it in a report from [facility] in March 2023).  There are no signs of perforation. He does not appear obstructed on the CT and has been having normal bowel movements with intermittent diarrhea.  - Given his comorbidity and nutritional state, he would be prohibitive risk for surgical intervention outside of an emergency.  He is also DNR.   - If the FB remains of concern then can consider engaging GI to determine whether there would be endoscopic options available.  - Surgery will sign off.  Please page the on call team with any questions or concerns.   Tacy Learn Surgery 04/26/2023, 10:22 AM Please see Amion for pager number during day hours 7:00am-4:30pm or 7:00am -11:30am on  weekends

## 2023-04-26 NOTE — H&P (Signed)
NAME:  Fernando Young, MRN:  [id number], DOB:  1940-08-31, LOS: 0 ADMISSION DATE:  04/26/2023, CONSULTATION DATE:  04/26/23 REFERRING MD: Greta Doom - EM , CHIEF COMPLAINT:  rigors    History of Present Illness:  82 yo M who is DNR/I with  PMH CLL, HTN, Fe def anemia, chronic diarrhea, pAF, chronic anticoagulation, combined systolic and diastolic HF, hypothyroidism, FTT in adult, malnutrition, recent admission for septic shock 2/2 PNA who presented to ED 12/25 with SOB progressive fatigue fevers chills and rigors prompting ED presentation.   ED workup w multifocal PNA on CXR. CT a/p w inflammatory changes surrounding distal ileum + persistent foreign body. He was hypotensive after 2L IVF and started on NE via port.  CCS is consulted re abdominal findings on imaging PCCM is called for admission   In d/w pt he is pretty frustrated at his ongoing decline. He has not been able to physically recover from last admission, has remained profoundly weak. Poor appetite and associate poor PO intake. Affirms DNR/I status and his gold form is at bedside with him.    Pertinent  Medical History  CLL FTT in adult Physical deconditioning  Protein calorie malnutrition  Septic shock Fe def anemia Chronic diarrhea Systolic and diastolic HF NSTEMI Hypothyroidism  Terminal ileitis E coli bacteremia Afib Chronic AC   Significant Hospital Events: Including procedures, antibiotic start and stop dates in addition to other pertinent events   12/25 septic shock PNA vs abdominal source. NE. Admit to PCCM. CCS consulted based on inflammatory changes on CT a/p   Interim History / Subjective:  Started on NE   Objective   Blood pressure 123/75, pulse 86, temperature 98.3 F (36.8 C), temperature source Oral, resp. rate 17, height 6' (1.829 m), weight 59.9 kg, SpO2 98%.       No intake or output data in the 24 hours ending 04/26/23 1206 Filed Weights   04/26/23 0712 04/26/23 0805  Weight: 62.8 kg 59.9 kg     Examination: General: cachectic ill appearing elderly M NAD  HENT: NCAT edentulous anicteric sclera  Lungs: Even unlabored  Cardiovascular: tachycardic. Cap refill < 3 sec   Abdomen: thin soft Extremities: no acute joint deformity. Pedal edema  Neuro: AAOx3, generalized weakness  GU: defer   Resolved Hospital Problem list     Assessment & Plan:   Septic shock +/- hypovolemic (poor PO intake, chronic diarrhea) +/- chronic hypotension  Multifocal PNA -- suspected aspiration PNA  ?intra-abdominal infection -- some incr inflammatory changes on CT and persistent foreign body  -diarrhea is chronic, dont think this is infectious  -based on his sx, think PNA is most likely  -notably his BP is on the low side of normal -- at his last outpt appt w PCP SBP 102. Rx midodrine outpt   P -admit to ICU -NE for MAP > 65  -zosyn, vanc (recent hospitalization, was MRSA PCR + previously  -ccs was consulted by EM  -f/u bcx  -send UA  -send expectorated sputum  -restart midodrine   Afib on eliquis  Combined systolic and diastolic HF  Chronic hypotension  P -with age  > 80 and weight < 60kg, reducing eliquis dose from 5mg  BID to 2.5 BID.  -amio  -defer diuresis  -as above, resuming his midodrine   Hypothyroidism  P -synthroid   CLL  Leukotytosis  Chronic anemia  -no acute intervention   Chronic diarrhea  -PRN lomotil, questran    DM -SSI   Possible dysphagia/aspiration -  ordering a mech soft. If he has ssx with this, NPO and SLP consult   Hypocalcemia -replace   DNR/I FTT in adult Physical deconditioning Severe protein calorie malnutrition -worry that he is on an overall decline. Pending his course, might be reasonable to engage w palliative care. He is pretty frustrated by his ongoing weakness, frailty and being in/out of hospital -DNR/I, pre-arrest interventions like pressors are ok    Best Practice (right click and "Reselect all SmartList Selections" daily)    Diet/type: dysphagia diet (see orders) DVT prophylaxis DOAC Pressure ulcer(s): pressure ulcer assessment deferred  GI prophylaxis: N/A Lines: Central line Foley:  N/A Code Status:  DNR Last date of multidisciplinary goals of care discussion [12/25]  Labs   CBC: Recent Labs  Lab 04/26/23 0730 04/26/23 1020  WBC 176.0*  --   NEUTROABS 3.5  --   HGB 9.7* 9.2*  HCT 33.4* 27.0*  MCV 97.7  --   PLT 274  --     Basic Metabolic Panel: Recent Labs  Lab 04/26/23 0730 04/26/23 0842 04/26/23 1020  NA 135 135 137  K >7.5* 6.7* 4.3  CL 99 101 99  CO2 25 24  --   GLUCOSE 133* 116* 105*  BUN 26* 24* 26*  CREATININE 1.12 1.02 1.00  CALCIUM 7.8* 7.5*  --    GFR: Estimated Creatinine Clearance: 46.6 mL/min (by C-G formula based on SCr of 1 mg/dL). Recent Labs  Lab 04/26/23 0730 04/26/23 0742 04/26/23 1016  WBC 176.0*  --   --   LATICACIDVEN  --  1.1 0.7    Liver Function Tests: Recent Labs  Lab 04/26/23 0730  AST 39  ALT 15  ALKPHOS 64  BILITOT 1.2*  PROT 5.7*  ALBUMIN 3.0*   No results for input(s): "LIPASE", "AMYLASE" in the last 168 hours. No results for input(s): "AMMONIA" in the last 168 hours.  ABG    Component Value Date/Time   TCO2 28 04/26/2023 1020     Coagulation Profile: Recent Labs  Lab 04/26/23 0730  INR 1.8*    Cardiac Enzymes: No results for input(s): "CKTOTAL", "CKMB", "CKMBINDEX", "TROPONINI" in the last 168 hours.  HbA1C: Hgb A1c MFr Bld  Date/Time Value Ref Range Status  04/02/2023 09:29 PM 5.6 4.8 - 5.6 % Final    Comment:    (NOTE) Pre diabetes:          5.7%-6.4%  Diabetes:              >6.4%  Glycemic control for   <7.0% adults with diabetes     CBG: No results for input(s): "GLUCAP" in the last 168 hours.  Review of Systems:   Review of Systems  Constitutional:  Positive for chills, fever, malaise/fatigue and weight loss.  Respiratory:  Positive for cough, sputum production and shortness of breath.    Gastrointestinal:  Positive for diarrhea.  Neurological:  Positive for weakness.    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   Surgical History:   Past Surgical History:  Procedure Laterality Date   PROSTATE BIOPSY       Social History:   reports that he has quit smoking. He has never used smokeless tobacco. He reports  that he does not currently use alcohol. He reports that he does not use drugs.   Family History:  His family history includes Cancer (age of onset: 96) in his sister; Ovarian cancer (age of onset: 17) in his mother.   Allergies No Known Allergies   Home Medications  Prior to Admission medications   Medication Sig Start Date End Date Taking? Authorizing Provider  acetaminophen (TYLENOL) 500 MG tablet Take 1,000 mg by mouth every 6 (six) hours as needed for mild pain (pain score 1-3) or fever.    [provider]  amiodarone (PACERONE) 200 MG tablet Take 200 mg by mouth daily. 03/10/20   [provider]  apixaban (ELIQUIS) 5 MG TABS tablet Take 5 mg by mouth 2 (two) times daily. 02/09/22   [provider]  Ascorbic Acid (VITAMIN C) 500 MG CAPS Take 500 mg by mouth daily.    [provider]  atorvastatin (LIPITOR) 20 MG tablet Take 20 mg by mouth at bedtime. 03/31/23   [provider]  bismuth subsalicylate (PEPTO BISMOL) 262 MG/15ML suspension Take 30 mLs by mouth every 6 (six) hours as needed for diarrhea or loose stools or indigestion. 01/05/22   [provider]  cholecalciferol (VITAMIN D3) 25 MCG (1000 UNIT) tablet  Take 1,000 Units by mouth daily.    [provider]  cholestyramine (QUESTRAN) 4 g packet Take 4 g by mouth daily. 10/19/22   [provider]  diphenoxylate-atropine (LOMOTIL) 2.5-0.025 MG tablet Take 1 tablet by mouth See admin instructions. Take 2 tablets by mouth in the morning and an additional 1 tablet up to twice a day as needed for loose stools 11/28/22   [provider]  doxycycline (VIBRA-TABS) 100 MG tablet Take 1 tablet (100 mg total) by mouth every 12 (twelve) hours. 04/08/23   Marinda Elk, MD  furosemide (LASIX) 40 MG tablet Take 0.5 tablets (20 mg total) by mouth daily. Patient not taking: Reported on 04/03/2023 02/22/22   Marguerita Merles Latif, DO  levothyroxine (SYNTHROID) 75 MCG tablet Take 75 mcg by mouth daily before breakfast. 12/06/21   [provider]  metFORMIN (GLUCOPHAGE-XR) 500 MG 24 hr tablet Take 500 mg by mouth 2 (two) times daily. 12/03/20   [provider]  midodrine (PROAMATINE) 5 MG tablet TAKE 1 TABLET (5 MG TOTAL) BY MOUTH 3 (THREE) TIMES DAILY WITH MEALS. 11/01/22   Dellia Beckwith, MD  nitroGLYCERIN (NITROSTAT) 0.4 MG SL tablet Place 0.4 mg under the tongue every 5 (five) minutes as needed for chest pain.    [provider]  rOPINIRole (REQUIP) 1 MG tablet Take 1 mg by mouth at bedtime. 03/31/23   [provider]  Wheat Dextrin (BENEFIBER) POWD Take 4 g by mouth See admin instructions. Mix 4 grams (1 teaspoonful) into a cup of coffee and drink every morning    [provider]     Critical care time: 52 min       CRITICAL CARE Performed by: Lanier Clam   Total critical care time: 52 minutes  Critical care time was exclusive of separately billable procedures and treating other patients. Critical care was necessary to treat or prevent imminent or life-threatening deterioration.  Critical care was time spent personally by me on the following activities: development of treatment  plan with patient and/or surrogate as well as nursing, discussions with consultants, evaluation of patient's response to treatment, examination of patient, obtaining history from patient or surrogate, ordering and performing treatments and  interventions, ordering and review of laboratory studies, ordering and review of radiographic studies, pulse oximetry and re-evaluation of patient's condition.  Tessie Fass MSN, AGACNP-BC [facility] Pulmonary/Critical Care Medicine Amion for pager  04/26/2023, 12:06 PM

## 2023-04-26 NOTE — ED Notes (Signed)
This RN communicated with EDP regarding hypotension. EDP to initiate levophed and admit to critical care.

## 2023-04-26 NOTE — ED Notes (Signed)
Patient transported to CT 

## 2023-04-26 NOTE — ED Notes (Signed)
Critical Care DO at bedside

## 2023-04-26 NOTE — ED Notes (Signed)
Theresa [phone]

## 2023-04-26 NOTE — Sepsis Progress Note (Signed)
12/25 elink will follow per sepsis protocol

## 2023-04-26 NOTE — ED Notes (Signed)
Critical Care NP at bedside 

## 2023-04-27 DIAGNOSIS — A419 Sepsis, unspecified organism: Secondary | ICD-10-CM | POA: Diagnosis not present

## 2023-04-27 DIAGNOSIS — R6521 Severe sepsis with septic shock: Secondary | ICD-10-CM | POA: Diagnosis not present

## 2023-04-27 LAB — CBC
HCT: 25.9 % — ABNORMAL LOW (ref 39.0–52.0)
Hemoglobin: 7.7 g/dL — ABNORMAL LOW (ref 13.0–17.0)
MCH: 28.5 pg (ref 26.0–34.0)
MCHC: 29.7 g/dL — ABNORMAL LOW (ref 30.0–36.0)
MCV: 95.9 fL (ref 80.0–100.0)
Platelets: 193 10*3/uL (ref 150–400)
RBC: 2.7 MIL/uL — ABNORMAL LOW (ref 4.22–5.81)
RDW: 16.4 % — ABNORMAL HIGH (ref 11.5–15.5)
WBC: 115.5 10*3/uL (ref 4.0–10.5)
nRBC: 0 % (ref 0.0–0.2)

## 2023-04-27 LAB — BLOOD CULTURE ID PANEL (REFLEXED) - BCID2

## 2023-04-27 LAB — PHOSPHORUS: Phosphorus: 3.6 mg/dL (ref 2.5–4.6)

## 2023-04-27 LAB — GLUCOSE, CAPILLARY
Glucose-Capillary: 84 mg/dL (ref 70–99)
Glucose-Capillary: 94 mg/dL (ref 70–99)
Glucose-Capillary: 173 mg/dL — ABNORMAL HIGH (ref 70–99)
Glucose-Capillary: 128 mg/dL — ABNORMAL HIGH (ref 70–99)
Glucose-Capillary: 141 mg/dL — ABNORMAL HIGH (ref 70–99)
Glucose-Capillary: 136 mg/dL — ABNORMAL HIGH (ref 70–99)

## 2023-04-27 LAB — BASIC METABOLIC PANEL
Anion gap: 6 (ref 5–15)
BUN: 22 mg/dL (ref 8–23)
CO2: 28 mmol/L (ref 22–32)
Calcium: 7.8 mg/dL — ABNORMAL LOW (ref 8.9–10.3)
Chloride: 104 mmol/L (ref 98–111)
Creatinine, Ser: 0.96 mg/dL (ref 0.61–1.24)
GFR, Estimated: >60 mL/min (ref >60)
Glucose, Bld: 89 mg/dL (ref 70–99)
Potassium: 4.4 mmol/L (ref 3.5–5.1)
Sodium: 138 mmol/L (ref 135–145)

## 2023-04-27 LAB — MAGNESIUM
Magnesium: 1.1 mg/dL — ABNORMAL LOW (ref 1.7–2.4)
Magnesium: 2.2 mg/dL (ref 1.7–2.4)

## 2023-04-27 MED ORDER — ORAL CARE MOUTH RINSE: 15.0000 mL | OROMUCOSAL | Status: DC | PRN

## 2023-04-27 MED ORDER — MAGNESIUM SULFATE 4 GM/100ML IV SOLN
4.0000 g | Freq: Once | INTRAVENOUS | Status: AC
Start: 2023-04-27 — End: 2023-04-27
  Administered 2023-04-27: 4 g via INTRAVENOUS
  Filled 2023-04-27: qty 100

## 2023-04-27 MED ORDER — SODIUM CHLORIDE 0.9% FLUSH
3.0000 mL | Freq: Two times a day (BID) | INTRAVENOUS | Status: DC
  Administered 2023-04-28 – 2023-04-30 (×3): 10 mL via INTRAVENOUS
  Administered 2023-05-01: 3 mL via INTRAVENOUS
  Administered 2023-05-01 – 2023-05-02 (×2): 10 mL via INTRAVENOUS

## 2023-04-27 MED ORDER — ROPINIROLE HCL 1 MG PO TABS
1.0000 mg | ORAL_TABLET | Freq: Every day | ORAL | Status: DC
Start: 2023-04-27 — End: 2023-05-02
  Administered 2023-04-27 – 2023-05-01 (×5): 1 mg via ORAL
  Filled 2023-04-27 (×6): qty 1

## 2023-04-27 MED ORDER — SODIUM CHLORIDE 0.9% FLUSH: 3.0000 mL | INTRAVENOUS | Status: DC | PRN

## 2023-04-27 MED ORDER — MAGNESIUM SULFATE 2 GM/50ML IV SOLN
2.0000 g | Freq: Once | INTRAVENOUS | Status: AC
Start: 2023-04-27 — End: 2023-04-27
  Administered 2023-04-27: 2 g via INTRAVENOUS
  Filled 2023-04-27: qty 50

## 2023-04-27 NOTE — Progress Notes (Signed)
[facility] ADULT ICU REPLACEMENT PROTOCOL   The patient does apply for the [facility] Adult ICU Electrolyte Replacment Protocol based on the criteria listed below:   1.Exclusion criteria: TCTS, ECMO, Dialysis, and Myasthenia Gravis patients 2. Is GFR >/= 30 ml/min? Yes.    Patient's GFR today is >60 3. Is SCr </= 2? Yes.   Patient's SCr is 0.96 mg/dL 4. Did SCr increase >/= 0.5 in 24 hours? No. 5.Pt's weight >40kg  Yes.   6. Abnormal electrolyte(s):   Mg 1.1  7. Electrolytes replaced per protocol 8.  Call MD STAT for K+ </= 2.5, Phos </= 1, or Mag </= 1 Physician:  Ignacia Palma R Denorris Reust 04/27/2023 5:28 AM

## 2023-04-27 NOTE — Evaluation (Signed)
Physical Therapy Evaluation Patient Details Name: Fernando Young MRN: [id number] DOB: 09-23-1940 Today's Date: 04/27/2023  History of Present Illness  82 yo man admitted 12/25 with here with dyspnea, fevers/rigors, and worsening diarrhea, abdominal pain. Found to be hypotensive. PMHx: CLL, chronic diarrhea, atrial fibrillation, heart failure, severe protein calorie malnutrition and repeated hospitalizations over the past few weeks.   Clinical Impression  Pt admitted with above diagnosis. Required mod assist for sit to stand transfer from recliner x2. Step pivot transfer from [facility] to bed with min assist. Bowel incontinence. Eager to go home. Will benefit from HHPT follow up to further improve independence and safety with mobility. Pt currently with functional limitations due to the deficits listed below (see PT Problem List). Pt will benefit from acute skilled PT to increase their independence and safety with mobility to allow discharge.           If plan is discharge home, recommend the following: A little help with walking and/or transfers;A little help with bathing/dressing/bathroom;Help with stairs or ramp for entrance;Assist for transportation;Assistance with cooking/housework   Can travel by private vehicle        Equipment Recommendations None recommended by PT  Recommendations for Other Services       Functional Status Assessment Patient has had a recent decline in their functional status and demonstrates the ability to make significant improvements in function in a reasonable and predictable amount of time.     Precautions / Restrictions Precautions Precautions: Fall Restrictions Weight Bearing Restrictions Per Provider Order: No      Mobility  Bed Mobility Overal bed mobility: Needs Assistance Bed Mobility: Sit to Supine     Supine to sit: Min assist     General bed mobility comments: Min assist for LEs into bed.  VC for technique. Able to scoot himself along bed  to [facility].    Transfers Overall transfer level: Needs assistance Equipment used: Rolling walker (2 wheels) Transfers: Sit to/from Stand, Bed to chair/wheelchair/BSC Sit to Stand: Mod assist   Step pivot transfers: Min assist       General transfer comment: Mod assist for boost to stand with VC for technique and hand placement prior to rising. Forward lean over toes helped quite a bit. Transitions to RW with guidance. Practiced x 2. Episode of bowel incontience. Min assist to step pivot transfer to bed, trunk flexed.    Ambulation/Gait                  Stairs            Wheelchair Mobility     Tilt Bed    Modified Rankin (Stroke Patients Only)       Balance Overall balance assessment: Needs assistance Sitting-balance support: No upper extremity supported, Feet supported Sitting balance-Leahy Scale: Fair   Postural control: Posterior lean Standing balance support: Reliant on assistive device for balance Standing balance-Leahy Scale: Poor                               Pertinent Vitals/Pain Pain Assessment Pain Assessment: No/denies pain    Home Living Family/patient expects to be discharged to:: Private residence Living Arrangements: Children Available Help at Discharge: Available 24 hours/day Type of Home: House Home Access: Stairs to enter Entrance Stairs-Rails: Can reach both Entrance Stairs-Number of Steps: 2   Home Layout: One level Home Equipment: Agricultural consultant (2 wheels);Cane - single point;BSC/3in1;Wheelchair - manual Additional Comments: son  stays at night, daughter daytime    Prior Function Prior Level of Function : Independent/Modified Independent;Driving             Mobility Comments: Working with HH at home, walking short distances. ADLs Comments: Increasing assist with ADL since last hospitalization at [facility]     Extremity/Trunk Assessment   Upper Extremity Assessment Upper Extremity Assessment: Defer to OT  evaluation RUE Deficits / Details: noted to have ataxic movements with BUE with active engagement. patient repoted this is his baseline. RUE Coordination: decreased fine motor LUE Coordination: decreased fine motor    Lower Extremity Assessment Lower Extremity Assessment: Generalized weakness LLE Deficits / Details: Able to bear weight through LEs, very weak functionally with attempting to rise from chair.    Cervical / Trunk Assessment Cervical / Trunk Assessment: Kyphotic  Communication   Communication Communication: No apparent difficulties  Cognition Arousal: Alert Behavior During Therapy: Flat affect Overall Cognitive Status: Within Functional Limits for tasks assessed                                 General Comments: frustrated with decline        General Comments General comments (skin integrity, edema, etc.): Bowel incontinence upon standing. Assisted with pericare and use bed pan in chair because pt nervous about sitting on BSC with hernia.    Exercises General Exercises - Lower Extremity Ankle Circles/Pumps: AROM, Both, 10 reps, Seated Long Arc Quad: Strengthening, Both, 10 reps, Seated   Assessment/Plan    PT Assessment Patient needs continued PT services  PT Problem List Decreased strength;Decreased mobility;Decreased safety awareness;Decreased knowledge of precautions;Decreased activity tolerance;Decreased balance;Decreased knowledge of use of DME;Decreased range of motion       PT Treatment Interventions DME instruction;Therapeutic activities;Gait training;Therapeutic exercise;Functional mobility training;Stair training;Balance training;Neuromuscular re-education;Wheelchair mobility training;Patient/family education    PT Goals (Current goals can be found in the Care Plan section)  Acute Rehab PT Goals Patient Stated Goal: go home PT Goal Formulation: With patient Time For Goal Achievement: 05/11/23 Potential to Achieve Goals: Fair     Frequency Min 1X/week     Co-evaluation               AM-PAC PT "6 Clicks" Mobility  Outcome Measure Help needed turning from your back to your side while in a flat bed without using bedrails?: A Little Help needed moving from lying on your back to sitting on the side of a flat bed without using bedrails?: A Little Help needed moving to and from a bed to a chair (including a wheelchair)?: A Lot Help needed standing up from a chair using your arms (e.g., wheelchair or bedside chair)?: A Lot Help needed to walk in hospital room?: A Lot Help needed climbing 3-5 steps with a railing? : Total 6 Click Score: 13    End of Session Equipment Utilized During Treatment: Gait belt Activity Tolerance: Patient tolerated treatment well Patient left: with call bell/phone within reach;in bed;with bed alarm set;with chair alarm set;with nursing/sitter in room Nurse Communication: Mobility status;Other (comment) (BM) PT Visit Diagnosis: Unsteadiness on feet (R26.81);Difficulty in walking, not elsewhere classified (R26.2);Muscle weakness (generalized) (M62.81)    Time: 0102-7253 PT Time Calculation (min) (ACUTE ONLY): 22 min   Charges:   PT Evaluation $PT Eval Low Complexity: 1 Low   PT General Charges $$ ACUTE PT VISIT: 1 Visit         Kathlyn Sacramento, PT,  DPT [facility]  Rehabilitation Services Physical Therapist Office: [phone] Website: [email or web address]   Berton Mount 04/27/2023, 2:59 PM

## 2023-04-27 NOTE — Progress Notes (Signed)
NAME:  Fernando Young, MRN:  [id number], DOB:  1941-04-12, LOS: 1 ADMISSION DATE:  04/26/2023, CONSULTATION DATE:  04/26/23 REFERRING MD: Greta Doom - EM , CHIEF COMPLAINT:  rigors    History of Present Illness:  82 yo M who is DNR/I with  PMH CLL, HTN, Fe def anemia, chronic diarrhea, pAF, chronic anticoagulation, combined systolic and diastolic HF, hypothyroidism, FTT in adult, malnutrition, recent admission for septic shock 2/2 PNA who presented to ED 12/25 with SOB progressive fatigue fevers chills and rigors prompting ED presentation.   ED workup w multifocal PNA on CXR. CT a/p w inflammatory changes surrounding distal ileum + persistent foreign body. He was hypotensive after 2L IVF and started on NE via port.  CCS is consulted re abdominal findings on imaging PCCM is called for admission   In d/w pt he is pretty frustrated at his ongoing decline. He has not been able to physically recover from last admission, has remained profoundly weak. Poor appetite and associate poor PO intake. Affirms DNR/I status and his gold form is at bedside with him.    Pertinent  Medical History  CLL FTT in adult Physical deconditioning  Protein calorie malnutrition  Septic shock Fe def anemia Chronic diarrhea Systolic and diastolic HF NSTEMI Hypothyroidism  Terminal ileitis E coli bacteremia Afib Chronic AC   Significant Hospital Events: Including procedures, antibiotic start and stop dates in addition to other pertinent events   12/25 septic shock PNA vs abdominal source. NE. Admit to PCCM. CCS consulted based on inflammatory changes on CT a/p   Interim History / Subjective:  Off vasopresors. Awake, alert, watching the news and eating breakfast.   Objective   Blood pressure 95/60, pulse 73, temperature 97.7 F (36.5 C), temperature source Oral, resp. rate 16, height 6' (1.829 m), weight 59.9 kg, SpO2 93%.        Intake/Output Summary (Last 24 hours) at 04/27/2023 1308 Last data filed at  04/27/2023 0800 Gross per 24 hour  Intake 4615.94 ml  Output 150 ml  Net 4465.94 ml   Filed Weights   04/26/23 6578 04/26/23 0805  Weight: 62.8 kg 59.9 kg    Examination: Cachectic frail, sitting up in bed, no respiratory distress RRR no mrg Thin, no edema Abdomen soft   Resolved Hospital Problem list     Assessment & Plan:   Septic Shock secondary to UTI Hypovolemia secondary to chronic diarrhea - continue zosyn - continue home midodrine. Now off vasopressors - has noted ?foreign body in abdomen for which no intervention is recommended - GPC 1/4 blood culture tubes - BCID pending but suspect contaminant - urine culture added on this morning, follow results  Afib on eliquis  Combined systolic and diastolic HF  Chronic hypotension  P -with age  > 80 and weight < 60kg, reducing eliquis dose from 5mg  BID to 2.5 BID.  -amio   Hypothyroidism  P -synthroid   CLL  Leukotytosis  Chronic anemia  -no acute intervention   Chronic diarrhea  -PRN lomotil, questran    DM -SSI   Possible dysphagia/aspiration - doing well with mechanically soft diet  Hypocalcemia -replace   DNR/I FTT in adult Physical deconditioning Severe protein calorie malnutrition -worry that he is on an overall decline. Pending his course, might be reasonable to engage w palliative care. He is pretty frustrated by his ongoing weakness, frailty and being in/out of hospital -DNR/I, pre-arrest interventions like pressors are ok  - have consulted palliative care to meet with the  patient and daughter [name] - they will be up on 12/27 in person for a family meeting.   Best Practice (right click and "Reselect all SmartList Selections" daily)   Diet/type: dysphagia diet (see orders) DVT prophylaxis DOAC Pressure ulcer(s): pressure ulcer assessment deferred  GI prophylaxis: N/A Lines: Central line Foley:  N/A Code Status:  DNR Last date of multidisciplinary goals of care discussion [12/26  [name] updated over the phone. ]  Will sign out to [facility] to assume care 12/27, appropriate for med surg.  I spent 50 minutes in total visit time for this patient, with more than 50% spent counseling/coordinating care.  Durel Salts, MD Pulmonary and Critical Care Medicine [facility] 04/27/2023 9:29 AM Pager: see AMION  If no response to pager, please call critical care on call (see AMION) until 7pm After 7:00 pm call Elink

## 2023-04-27 NOTE — Plan of Care (Signed)

## 2023-04-27 NOTE — Evaluation (Signed)
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 Here with dyspnea, fevers/rigors, and worsening diarrhea, abdominal pain. Found to be hypotensive.   Clinical Impression   Patient is a 82 year old male who was admitted for above. Patient was living at home with 24/7 support from family. Patient was noted to have decreased functional activity tolerance, decreased endurance, decreased standing balance, decreased safety awareness, and decreased knowledge of AD/AE impacting participation in ADLs. Patient plans tto d/c home with family support and [facility] services. Patient would continue to benefit from skilled OT services at this time while admitted.         If plan is discharge home, recommend the following: A little help with walking and/or transfers;A little help with bathing/dressing/bathroom;Assistance with cooking/housework;Direct supervision/assist for medications management;Assist for transportation;Help with stairs or ramp for entrance;Direct supervision/assist for financial management    Functional Status Assessment  Patient has had a recent decline in their functional status and demonstrates the ability to make significant improvements in function in a reasonable and predictable amount of time.  Equipment Recommendations  None recommended by OT    Recommendations for Other Services       Precautions / Restrictions Precautions Precautions: Fall Restrictions Weight Bearing Restrictions Per Provider Order: No      Mobility Bed Mobility Overal bed mobility: Needs Assistance Bed Mobility: Supine to Sit     Supine to sit: Mod assist          Transfers Overall transfer level: Needs assistance Equipment used:  Rolling walker (2 wheels) Transfers: Sit to/from Stand, Bed to chair/wheelchair/BSC Sit to Stand: Mod assist     Step pivot transfers: Min assist            Balance Overall balance assessment: Needs assistance Sitting-balance support: No upper extremity supported, Feet supported Sitting balance-Leahy Scale: Fair   Postural control: Posterior lean Standing balance support: Reliant on assistive device for balance Standing balance-Leahy Scale: Poor                             ADL either performed or assessed with clinical judgement   ADL   Eating/Feeding: Set up;Sitting Eating/Feeding Details (indicate cue type and reason): provided with red foam handles to increase ability to hold onto utensils. Grooming: Therapist, nutritional;Set up;Sitting   Upper Body Bathing: Sitting;Minimal assistance   Lower Body Bathing: Moderate assistance;Sitting/lateral leans;Sit to/from stand   Upper Body Dressing : Sitting;Minimal assistance   Lower Body Dressing: Sitting/lateral leans;Moderate assistance;Sit to/from stand;Maximal assistance   Toilet Transfer: Minimal assistance;Moderate assistance;Stand-pivot;BSC/3in1   Toileting- Clothing Manipulation and Hygiene: Maximal assistance;Sit to/from stand               Vision Patient Visual Report: No change from baseline       Perception Perception: Not tested       Praxis Praxis: Not tested       Pertinent Vitals/Pain Pain Assessment Pain Assessment: No/denies pain     Extremity/Trunk Assessment Upper Extremity Assessment Upper Extremity Assessment: Generalized weakness;RUE deficits/detail RUE Deficits / Details: noted to have ataxic movements with BUE with active engagement. patient repoted this is his baseline. RUE Coordination: decreased fine motor LUE Coordination: decreased fine motor   Lower Extremity Assessment Lower Extremity Assessment:  Defer to PT evaluation   Cervical / Trunk Assessment Cervical / Trunk  Assessment: Kyphotic   Communication Communication Communication: No apparent difficulties   Cognition Arousal: Alert Behavior During Therapy: Flat affect Overall Cognitive Status: Within Functional Limits for tasks assessed                                 General Comments: frustrated with decline     General Comments   VSS on RA    Exercises     Shoulder Instructions      Home Living Family/patient expects to be discharged to:: Private residence Living Arrangements: Children Available Help at Discharge: Available 24 hours/day Type of Home: House Home Access: Stairs to enter [employer] of Steps: 2 Entrance Stairs-Rails: Can reach both Home Layout: One level     Bathroom Shower/Tub: Sponge bathes at baseline   [employer]: Standard Bathroom Accessibility: Yes How Accessible: Accessible via walker Home Equipment: Rolling Walker (2 wheels);Cane - single point;BSC/3in1;Wheelchair - manual   Additional Comments: son stays at night, daughter daytime      Prior Functioning/Environment               Mobility Comments: Working with HH at home, walking short distances. ADLs Comments: Increasing assist with ADL since last hospitalization at [facility]        OT Problem List: Decreased activity tolerance;Impaired balance (sitting and/or standing);Decreased coordination;Decreased safety awareness;Decreased knowledge of precautions;Decreased knowledge of use of DME or AE      OT Treatment/Interventions: Self-care/ADL training;Therapeutic exercise;DME and/or AE instruction;Patient/family education;Therapeutic activities;Balance training    OT Goals(Current goals can be found in the care plan section) Acute Rehab OT Goals Patient Stated Goal: Return home with [facility] and assist from family OT Goal Formulation: With patient Time For Goal Achievement: 05/11/23 Potential to Achieve Goals: Fair  OT Frequency: Min 1X/week    Co-evaluation               AM-PAC OT "6 Clicks" Daily Activity     Outcome Measure Help from another person eating meals?: A Little Help from another person taking care of personal grooming?: A Little Help from another person toileting, which includes using toliet, bedpan, or urinal?: A Lot Help from another person bathing (including washing, rinsing, drying)?: A Lot Help from another person to put on and taking off regular upper body clothing?: A Little Help from another person to put on and taking off regular lower body clothing?: A Lot 6 Click Score: 15   End of Session Equipment Utilized During Treatment: Gait belt;Rolling walker (2 wheels) Nurse Communication: Mobility status  Activity Tolerance: Patient tolerated treatment well Patient left: in chair;with call bell/phone within reach;with chair alarm set  OT Visit Diagnosis: Unsteadiness on feet (R26.81);Other abnormalities of gait and mobility (R26.89);Muscle weakness (generalized) (M62.81)                Time: 4098-1191 OT Time Calculation (min): 23 min Charges:  OT General Charges $OT Visit: 1 Visit OT Evaluation $OT Eval Moderate Complexity: 1 Mod OT Treatments $Therapeutic Activity: 8-22 mins  04/27/2023  RP, OTR/L  Acute Rehabilitation Services  Office:  [phone]   Suzanna Obey 04/27/2023, 1:35 PM

## 2023-04-27 NOTE — Progress Notes (Signed)
PHARMACY - PHYSICIAN COMMUNICATION CRITICAL VALUE ALERT - BLOOD CULTURE IDENTIFICATION (BCID)  Fernando Young is an 82 y.o. male who presented to [facility] on 04/26/2023 with a chief complaint of rigors  Assessment: Blood culture 1 out of 4 bottles with MRSE. MD suspects urinary source. Improving on current therapy. Urine culture pending.   Name of physician (or Provider) Contacted: Dr Celine Mans  Current antibiotics: Zosyn  Changes to prescribed antibiotics recommended:  Continue current therapy per provider  Results for orders placed or performed during the hospital encounter of 04/26/23  Blood Culture ID Panel (Reflexed) (Collected: 04/26/2023  7:30 AM)  Result Value Ref Range   Enterococcus faecalis NOT DETECTED NOT DETECTED   Enterococcus Faecium NOT DETECTED NOT DETECTED   Listeria monocytogenes NOT DETECTED NOT DETECTED   Staphylococcus species DETECTED (A) NOT DETECTED   Staphylococcus aureus (BCID) NOT DETECTED NOT DETECTED   Staphylococcus epidermidis DETECTED (A) NOT DETECTED   Staphylococcus lugdunensis NOT DETECTED NOT DETECTED   Streptococcus species NOT DETECTED NOT DETECTED   Streptococcus agalactiae NOT DETECTED NOT DETECTED   Streptococcus pneumoniae NOT DETECTED NOT DETECTED   Streptococcus pyogenes NOT DETECTED NOT DETECTED   A.calcoaceticus-baumannii NOT DETECTED NOT DETECTED   Bacteroides fragilis NOT DETECTED NOT DETECTED   Enterobacterales NOT DETECTED NOT DETECTED   Enterobacter cloacae complex NOT DETECTED NOT DETECTED   Escherichia coli NOT DETECTED NOT DETECTED   Klebsiella aerogenes NOT DETECTED NOT DETECTED   Klebsiella oxytoca NOT DETECTED NOT DETECTED   Klebsiella pneumoniae NOT DETECTED NOT DETECTED   Proteus species NOT DETECTED NOT DETECTED   Salmonella species NOT DETECTED NOT DETECTED   Serratia marcescens NOT DETECTED NOT DETECTED   Haemophilus influenzae NOT DETECTED NOT DETECTED   Neisseria meningitidis NOT DETECTED NOT DETECTED    Pseudomonas aeruginosa NOT DETECTED NOT DETECTED   Stenotrophomonas maltophilia NOT DETECTED NOT DETECTED   Candida albicans NOT DETECTED NOT DETECTED   Candida auris NOT DETECTED NOT DETECTED   Candida glabrata NOT DETECTED NOT DETECTED   Candida krusei NOT DETECTED NOT DETECTED   Candida parapsilosis NOT DETECTED NOT DETECTED   Candida tropicalis NOT DETECTED NOT DETECTED   Cryptococcus neoformans/gattii NOT DETECTED NOT DETECTED   Methicillin resistance mecA/C DETECTED (A) NOT DETECTED    Fernando Young 04/27/2023  11:20 AM

## 2023-04-28 DIAGNOSIS — R6521 Severe sepsis with septic shock: Secondary | ICD-10-CM | POA: Diagnosis not present

## 2023-04-28 DIAGNOSIS — A419 Sepsis, unspecified organism: Secondary | ICD-10-CM | POA: Diagnosis not present

## 2023-04-28 LAB — BASIC METABOLIC PANEL
Anion gap: 8 (ref 5–15)
BUN: 14 mg/dL (ref 8–23)
CO2: 25 mmol/L (ref 22–32)
Calcium: 7.7 mg/dL — ABNORMAL LOW (ref 8.9–10.3)
Chloride: 101 mmol/L (ref 98–111)
Creatinine, Ser: 0.86 mg/dL (ref 0.61–1.24)
GFR, Estimated: >60 mL/min (ref >60)
Glucose, Bld: 83 mg/dL (ref 70–99)
Potassium: 5.9 mmol/L — ABNORMAL HIGH (ref 3.5–5.1)
Sodium: 134 mmol/L — ABNORMAL LOW (ref 135–145)

## 2023-04-28 LAB — GLUCOSE, CAPILLARY
Glucose-Capillary: 108 mg/dL — ABNORMAL HIGH (ref 70–99)
Glucose-Capillary: 108 mg/dL — ABNORMAL HIGH (ref 70–99)
Glucose-Capillary: 112 mg/dL — ABNORMAL HIGH (ref 70–99)
Glucose-Capillary: 85 mg/dL (ref 70–99)
Glucose-Capillary: 92 mg/dL (ref 70–99)
Glucose-Capillary: 99 mg/dL (ref 70–99)

## 2023-04-28 LAB — URINE CULTURE

## 2023-04-28 LAB — MAGNESIUM: Magnesium: 2.1 mg/dL (ref 1.7–2.4)

## 2023-04-28 MED ORDER — SODIUM ZIRCONIUM CYCLOSILICATE 10 G PO PACK
10.0000 g | PACK | Freq: Once | ORAL | Status: AC
  Administered 2023-04-28: 10 g via ORAL
  Filled 2023-04-28: qty 1

## 2023-04-28 MED ORDER — LINEZOLID 600 MG PO TABS
600.0000 mg | ORAL_TABLET | Freq: Two times a day (BID) | ORAL | Status: DC
  Administered 2023-04-28 – 2023-04-30 (×6): 600 mg via ORAL
  Filled 2023-04-28 (×7): qty 1

## 2023-04-28 NOTE — Progress Notes (Signed)
Transition of Care [facility]) - Inpatient Brief Assessment   Patient Details  Name: Fernando Young MRN: [id number] Date of Birth: [birth year]  [facility] CM/SW Contact:    Janae Bridgeman, RN Phone Number: 04/28/2023, 11:49 AM   Clinical Narrative: Patient admitted to the hospital for Septicemia and UTI and is currently being treated with IV antibiotics in the hospital.  Patient has a Right chest port-a-cath.  Patient states that he lives with his daughter, [name].  Patient is active with [facility] for PT/OT.  Home health orders will be placed to be co-signed by the MD.  DME at the home includes RW, Cane, [facility] and hospital bed.  No other TOC needs at this time.  Patient is able to discharge home with daughter when he is medically stable for discharge.   Transition of Care Asessment: Insurance and Status: (P) Insurance coverage has been reviewed Patient has primary care physician: (P) Yes Home environment has been reviewed: (P) from home with daughter Prior level of function:: (P) family assistance Prior/Current Home Services: (P) Current home services (Active with [facility]) Social Drivers of Health Review: (P) SDOH reviewed interventions complete Readmission risk has been reviewed: (P) Yes Transition of care needs: (P) transition of care needs identified, TOC will continue to follow

## 2023-04-28 NOTE — Care Management Important Message (Signed)
Important Message  Patient Details  Name: Fernando Young MRN: [id number] Date of Birth: 1940-10-07   Important Message Given:  Yes - Medicare IM     Dorena Bodo 04/28/2023, 2:31 PM

## 2023-04-28 NOTE — Progress Notes (Signed)
PROGRESS NOTE    Fernando Young  ZOX:[id number] DOB: 1940/08/31 DOA: 04/26/2023 PCP: [name], MD   Brief Narrative: admitted 12/25 by pccm [facility] pick up 29/81 82 year old male with history of CLL, hypertension, iron deficiency anemia, diarrhea, paroxysmal atrial fibrillation, combined diastolic systolic heart failure, hypothyroidism, failure to thrive, malnutrition, recent admission for septic shock readmitted with multilobar pneumonia on 04/26/2023.  He presented with complaints of fever chills rigors fatigue and shortness of breath and cough.  X-ray showed multifocal pneumonia.  CT of the abdomen and pelvis showed inflammatory changes surrounding the distal ileum and persistent foreign body for which general surgery was consulted recommended conservative management.  Patient was initially started on fluids without improvement in hypotension he was started on normal epinephrine drip with improvement in his blood pressure.  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   Weight as of this encounter: 67 kg.  DVT prophylaxis: eliquis Code Status: dnr Family  Communication:none Disposition Plan:  Status is: Inpatient Remains inpatient appropriate because: acute illness   Consultants: pccm  Procedures:none Antimicrobials:zosyn zyvox  Subjective: Resting in bed Weak  Objective: Vitals:   04/28/23 0350 04/28/23 0414 04/28/23 0736 04/28/23 1029  BP: 122/78  139/83 97/65  Pulse: (!) 58  (!) 57 68  Resp: 18  16   Temp: 98 F (36.7 C)  97.9 F (36.6 C)   TempSrc: Oral     SpO2: 96%  97% 96%  Weight:  67 kg    Height:        Intake/Output Summary (Last 24 hours) at 04/28/2023 1108 Last data filed at 04/28/2023 0830 Gross per 24 hour  Intake 225.04 ml  Output --  Net 225.04 ml   Filed Weights   04/26/23 0712 04/26/23 0805 04/28/23 0414  Weight: 62.8 kg 59.9 kg 67 kg    Examination:  General exam: Appears calm and comfortable  Respiratory system: Clear to auscultation. Respiratory effort normal. Cardiovascular system: S1 & S2 heard, RRR. No JVD, murmurs, rubs, gallops or clicks. No pedal edema. Gastrointestinal system: Abdomen is nondistended, soft and nontender. No organomegaly or masses felt. Normal bowel sounds heard. Central nervous system: Alert and oriented. No focal neurological deficits. Extremities: Symmetric 5 x 5 power.  Data Reviewed: I have personally reviewed following labs and imaging studies  CBC: Recent Labs  Lab 04/26/23 0730 04/26/23 1020 04/27/23 0418  WBC 176.0*  --  115.5*  NEUTROABS 3.5  --   --   HGB 9.7* 9.2* 7.7*  HCT 33.4* 27.0* 25.9*  MCV 97.7  --  95.9  PLT 274  --  193   Basic Metabolic Panel: Recent Labs  Lab 04/26/23 0730 04/26/23 0842 04/26/23 1020 04/27/23 0418 04/27/23 1936 04/28/23 0710  NA 135 135 137 138  --  134*  K >7.5* 6.7* 4.3 4.4  --  5.9*  CL 99 101 99 104  --  101  CO2 25 24  --  28  --  25  GLUCOSE 133* 116* 105* 89  --  83  BUN 26* 24* 26* 22  --  14  CREATININE 1.12 1.02 1.00 0.96  --  0.86  CALCIUM 7.8* 7.5*  --  7.8*  --  7.7*  MG  --   --   --  1.1*  2.2 2.1  PHOS  --   --   --  3.6  --   --    GFR: Estimated Creatinine Clearance: 60.6 mL/min (by C-G formula based on SCr of 0.86 mg/dL). Liver Function Tests: Recent Labs  Lab 04/26/23 0730  AST 39  ALT 15  ALKPHOS 64  BILITOT 1.2*  PROT 5.7*  ALBUMIN 3.0*   No results for input(s): "LIPASE", "AMYLASE" in the last 168 hours. No results for input(s): "AMMONIA" in the last 168 hours. Coagulation Profile: Recent Labs  Lab 04/26/23 0730  INR 1.8*   Cardiac Enzymes: No results for input(s): "CKTOTAL", "CKMB", "CKMBINDEX", "TROPONINI" in the last 168 hours. BNP (last 3 results) No results for input(s): "PROBNP" in the last 8760 hours. HbA1C: No results for input(s): "HGBA1C" in the last 72 hours. CBG: Recent Labs  Lab 04/27/23 1534 04/27/23 2035 04/28/23 0000 04/28/23 0347 04/28/23 0737  GLUCAP 141* 136* 99 92 85   Lipid Profile: No results for input(s): "CHOL", "HDL", "LDLCALC", "TRIG", "CHOLHDL", "LDLDIRECT" in the last 72 hours. Thyroid Function Tests: No results for input(s): "TSH", "T4TOTAL", "FREET4", "T3FREE", "THYROIDAB" in the last 72 hours. Anemia Panel: No results for input(s): "VITAMINB12", "FOLATE", "FERRITIN", "TIBC", "IRON", "RETICCTPCT" in the last 72 hours. Sepsis Labs: Recent Labs  Lab 04/26/23 0742 04/26/23 1016  LATICACIDVEN 1.1 0.7    Recent Results (from the past 240 hours)  Resp panel by RT-PCR (RSV, Flu A&B, Covid) Anterior Nasal Swab     Status: None   Collection Time: 04/26/23  7:18 AM   Specimen: Anterior Nasal Swab  Result Value Ref Range Status   SARS Coronavirus 2 by RT PCR NEGATIVE NEGATIVE Final   Influenza A by PCR NEGATIVE NEGATIVE Final   Influenza B by PCR NEGATIVE NEGATIVE Final    Comment: (NOTE) The Xpert Xpress SARS-CoV-2/FLU/RSV plus assay is intended as an aid in the diagnosis of influenza from Nasopharyngeal swab specimens and should not be used as a sole basis for treatment. Nasal washings and aspirates are  unacceptable for Xpert Xpress SARS-CoV-2/FLU/RSV testing.  Fact Sheet for Patients: BloggerCourse.com  Fact Sheet for Healthcare Providers: SeriousBroker.it  This test is not yet approved or cleared by the [place] FDA and has been authorized for detection and/or diagnosis of SARS-CoV-2 by FDA under an Emergency Use Authorization (EUA). This EUA will remain in effect (meaning this test can be used) for the duration of the COVID-19 declaration under Section 564(b)(1) of the Act, 21 U.S.C. section 360bbb-3(b)(1), unless the authorization is terminated or revoked.     Resp Syncytial Virus by PCR NEGATIVE NEGATIVE Final    Comment: (NOTE) Fact Sheet for Patients: BloggerCourse.com  Fact Sheet for Healthcare Providers: SeriousBroker.it  This test is not yet approved or cleared by the [place] FDA and has been authorized for detection and/or diagnosis of SARS-CoV-2 by FDA  under an Emergency Use Authorization (EUA). This EUA will remain in effect (meaning this test can be used) for the duration of the COVID-19 declaration under Section 564(b)(1) of the Act, 21 U.S.C. section 360bbb-3(b)(1), unless the authorization is terminated or revoked.  Performed at [facility], [address], [place], [place] [address]   Blood Culture (routine x 2)     Status: Abnormal (Preliminary result)   Collection Time: 04/26/23  7:30 AM   Specimen: BLOOD  Result Value Ref Range Status   Specimen Description BLOOD PORTA CATH  Final   Special Requests   Final    BOTTLES DRAWN AEROBIC AND ANAEROBIC Blood Culture results may not be optimal due to an inadequate volume of blood received in culture bottles   Culture  Setup Time   Final    GRAM POSITIVE COCCI IN BOTH AEROBIC AND ANAEROBIC BOTTLES CRITICAL RESULT CALLED TO, READ BACK BY AND VERIFIED WITH: PHARMD J FRANS [id number] AT 958 AM BY  CM    Culture (A)  Final    STAPHYLOCOCCUS EPIDERMIDIS THE SIGNIFICANCE OF ISOLATING THIS ORGANISM FROM A SINGLE SET OF BLOOD CULTURES WHEN MULTIPLE SETS ARE DRAWN IS UNCERTAIN. PLEASE NOTIFY THE MICROBIOLOGY DEPARTMENT WITHIN ONE WEEK IF SPECIATION AND SENSITIVITIES ARE REQUIRED. Performed at [facility], [address] [place], [place] [address]    Report Status PENDING  Incomplete  Blood Culture ID Panel (Reflexed)     Status: Abnormal   Collection Time: 04/26/23  7:30 AM  Result Value Ref Range Status   Enterococcus faecalis NOT DETECTED NOT DETECTED Final   Enterococcus Faecium NOT DETECTED NOT DETECTED Final   Listeria monocytogenes NOT DETECTED NOT DETECTED Final   Staphylococcus species DETECTED (A) NOT DETECTED Final    Comment: CRITICAL RESULT CALLED TO, READ BACK BY AND VERIFIED WITH: PHARMD J FRANS [id number] AT 958 AM BY CM    Staphylococcus aureus (BCID) NOT DETECTED NOT DETECTED Final   Staphylococcus epidermidis DETECTED (A) NOT DETECTED Final    Comment: Methicillin (oxacillin) resistant coagulase negative staphylococcus. Possible blood culture contaminant (unless isolated from more than one blood culture draw or clinical case suggests pathogenicity). No antibiotic treatment is indicated for blood  culture contaminants. CRITICAL RESULT CALLED TO, READ BACK BY AND VERIFIED WITH: PHARMD J FRANS [id number] AT 958 AM BY CM    Staphylococcus lugdunensis NOT DETECTED NOT DETECTED Final   Streptococcus species NOT DETECTED NOT DETECTED Final   Streptococcus agalactiae NOT DETECTED NOT DETECTED Final   Streptococcus pneumoniae NOT DETECTED NOT DETECTED Final   Streptococcus pyogenes NOT DETECTED NOT DETECTED Final   A.calcoaceticus-baumannii NOT DETECTED NOT DETECTED Final   Bacteroides fragilis NOT DETECTED NOT DETECTED Final   Enterobacterales NOT DETECTED NOT DETECTED Final   Enterobacter cloacae complex NOT DETECTED NOT DETECTED Final   Escherichia coli NOT DETECTED NOT  DETECTED Final   Klebsiella aerogenes NOT DETECTED NOT DETECTED Final   Klebsiella oxytoca NOT DETECTED NOT DETECTED Final   Klebsiella pneumoniae NOT DETECTED NOT DETECTED Final   Proteus species NOT DETECTED NOT DETECTED Final   Salmonella species NOT DETECTED NOT DETECTED Final   Serratia marcescens NOT DETECTED NOT DETECTED Final   Haemophilus influenzae NOT DETECTED NOT DETECTED Final   Neisseria meningitidis NOT DETECTED NOT DETECTED Final   Pseudomonas aeruginosa NOT DETECTED NOT DETECTED Final   Stenotrophomonas maltophilia NOT DETECTED NOT DETECTED Final   Candida albicans NOT DETECTED NOT DETECTED Final   Candida auris NOT DETECTED NOT DETECTED Final  Candida glabrata NOT DETECTED NOT DETECTED Final   Candida krusei NOT DETECTED NOT DETECTED Final   Candida parapsilosis NOT DETECTED NOT DETECTED Final   Candida tropicalis NOT DETECTED NOT DETECTED Final   Cryptococcus neoformans/gattii NOT DETECTED NOT DETECTED Final   Methicillin resistance mecA/C DETECTED (A) NOT DETECTED Final    Comment: CRITICAL RESULT CALLED TO, READ BACK BY AND VERIFIED WITH: PHARMD J FRANS [id number] AT 958 AM BY CM Performed at [facility], [address] [place], [place] [address]   Blood Culture (routine x 2)     Status: None (Preliminary result)   Collection Time: 04/26/23  7:52 AM   Specimen: BLOOD  Result Value Ref Range Status   Specimen Description BLOOD LEFT ANTECUBITAL  Final   Special Requests   Final    BOTTLES DRAWN AEROBIC AND ANAEROBIC Blood Culture results may not be optimal due to an inadequate volume of blood received in culture bottles   Culture   Final    NO GROWTH 2 DAYS Performed at [facility], [address], [place], [place] [address]    Report Status PENDING  Incomplete  MRSA Next Gen by PCR, Nasal     Status: Abnormal   Collection Time: 04/26/23 12:04 PM   Specimen: Nasal Mucosa; Nasal Swab  Result Value Ref Range Status   MRSA by PCR Next Gen DETECTED  (A) NOT DETECTED Final    Comment: (NOTE) The GeneXpert MRSA Assay (FDA approved for NASAL specimens only), is one component of a comprehensive MRSA colonization surveillance program. It is not intended to diagnose MRSA infection nor to guide or monitor treatment for MRSA infections. Test performance is not FDA approved in patients less than 45 years old. Performed at [facility], [address], [place], [place] [address]   Urine Culture     Status: Abnormal   Collection Time: 04/27/23  9:12 AM   Specimen: Urine, Random  Result Value Ref Range Status   Specimen Description URINE, RANDOM  Final   Special Requests   Final    NONE Performed at [facility], [address], [place], [place] [address]    Culture MULTIPLE SPECIES PRESENT, SUGGEST RECOLLECTION (A)  Final   Report Status 04/28/2023 FINAL  Final      Radiology Studies: No results found.   Scheduled Meds:  amiodarone  200 mg Oral Daily   apixaban  2.5 mg Oral BID   atorvastatin  20 mg Oral Daily   Chlorhexidine Gluconate Cloth  6 each Topical Q0600   cholestyramine light  4 g Oral BID   insulin aspart  0-6 Units Subcutaneous Q4H   levothyroxine  75 mcg Oral Q0600   midodrine  5 mg Oral Q8H   mupirocin ointment  1 Application Nasal BID   rOPINIRole  1 mg Oral QHS   sodium chloride flush  3-10 mL Intravenous Q12H   Continuous Infusions:  piperacillin-tazobactam (ZOSYN)  IV 3.375 g (04/28/23 1025)     LOS: 2 days    Time spent: 38 min  Alwyn Ren, MD 04/28/2023, 11:08 AM

## 2023-04-28 NOTE — Progress Notes (Signed)
Physical Therapy Treatment Patient Details Name: Fernando Young MRN: [id number] DOB: 1940-06-24 Today's Date: 04/28/2023   History of Present Illness 82 yo man admitted 12/25 with here with dyspnea, fevers/rigors, and worsening diarrhea, abdominal pain. Found to be hypotensive. PMHx: CLL, chronic diarrhea, atrial fibrillation, heart failure, severe protein calorie malnutrition and repeated hospitalizations over the past few weeks.    PT Comments  Pt received in supine and agreeable to session. Pt demonstrates LLE internal rotation in resting and during mobility tasks. Pt requires up to mod A for bed mobility and standing from elevated EOB due to weakness. Pt demonstrates fair stability with RW during pivot to recliner and is able to tolerate static standing marches with good B hip flexion. Pt attempts to stand from recliner, however is unable from the lower surface. Pt becomes frustrated after attempt, but is able to tolerate seated BLE exercise. Pt continues to benefit from PT services to progress toward functional mobility goals.    If plan is discharge home, recommend the following: A little help with walking and/or transfers;A little help with bathing/dressing/bathroom;Help with stairs or ramp for entrance;Assist for transportation;Assistance with cooking/housework   Can travel by private vehicle        Equipment Recommendations  None recommended by PT    Recommendations for Other Services       Precautions / Restrictions Precautions Precautions: Fall Restrictions Weight Bearing Restrictions Per Provider Order: No     Mobility  Bed Mobility Overal bed mobility: Needs Assistance Bed Mobility: Supine to Sit     Supine to sit: Mod assist, Contact guard, HOB elevated, Used rails     General bed mobility comments: Mod A with bedpad to scoot forward to EOB, but CGA and increased time for BLE management and trunk elevation    Transfers Overall transfer level: Needs  assistance Equipment used: Rolling walker (2 wheels) Transfers: Sit to/from Stand, Bed to chair/wheelchair/BSC Sit to Stand: Mod assist, From elevated surface   Step pivot transfers: Min assist       General transfer comment: increased time and mod A to stand from elevated EOB with cues for anterior weight shift. Min A for balance to pivot to recliner. Pt unable to stand from recliner despite max A    Ambulation/Gait             Pre-gait activities: static standing marches        Balance Overall balance assessment: Needs assistance Sitting-balance support: No upper extremity supported, Feet supported Sitting balance-Leahy Scale: Fair Sitting balance - Comments: sitting EOB   Standing balance support: Bilateral upper extremity supported, During functional activity, Reliant on assistive device for balance Standing balance-Leahy Scale: Poor Standing balance comment: with RW support                            Cognition Arousal: Alert Behavior During Therapy: Flat affect, Lability Overall Cognitive Status: Within Functional Limits for tasks assessed                                 General Comments: Pt becomes frustrated and emotional quickly when unable to stand from recliner        Exercises General Exercises - Lower Extremity Long Arc Quad: Strengthening, Both, 10 reps, Seated Hip Flexion/Marching: AROM, Seated, Both, 10 reps, Strengthening, Standing    General Comments        Pertinent  Vitals/Pain Pain Assessment Pain Assessment: No/denies pain     PT Goals (current goals can now be found in the care plan section) Acute Rehab PT Goals Patient Stated Goal: go home PT Goal Formulation: With patient Time For Goal Achievement: 05/11/23 Progress towards PT goals: Progressing toward goals    Frequency    Min 1X/week       AM-PAC PT "6 Clicks" Mobility   Outcome Measure  Help needed turning from your back to your side while  in a flat bed without using bedrails?: A Little Help needed moving from lying on your back to sitting on the side of a flat bed without using bedrails?: A Little Help needed moving to and from a bed to a chair (including a wheelchair)?: A Lot Help needed standing up from a chair using your arms (e.g., wheelchair or bedside chair)?: A Lot Help needed to walk in hospital room?: A Lot Help needed climbing 3-5 steps with a railing? : Total 6 Click Score: 13    End of Session Equipment Utilized During Treatment: Gait belt Activity Tolerance: Patient tolerated treatment well Patient left: with call bell/phone within reach;with chair alarm set;in chair;with family/visitor present Nurse Communication: Mobility status;Need for lift equipment PT Visit Diagnosis: Unsteadiness on feet (R26.81);Difficulty in walking, not elsewhere classified (R26.2);Muscle weakness (generalized) (M62.81)     Time: 9604-5409 PT Time Calculation (min) (ACUTE ONLY): 20 min  Charges:    $Therapeutic Activity: 8-22 mins PT General Charges $$ ACUTE PT VISIT: 1 Visit                     Johny Shock, PTA Acute Rehabilitation Services Secure Chat Preferred  [phone]    Johny Shock 04/28/2023, 10:28 AM

## 2023-04-29 ENCOUNTER — Other Ambulatory Visit: Payer: Self-pay | Admitting: Oncology

## 2023-04-29 DIAGNOSIS — A419 Sepsis, unspecified organism: Secondary | ICD-10-CM | POA: Diagnosis not present

## 2023-04-29 DIAGNOSIS — R6521 Severe sepsis with septic shock: Secondary | ICD-10-CM | POA: Diagnosis not present

## 2023-04-29 DIAGNOSIS — I959 Hypotension, unspecified: Secondary | ICD-10-CM

## 2023-04-29 LAB — CULTURE, BLOOD (ROUTINE X 2)

## 2023-04-29 LAB — GLUCOSE, CAPILLARY
Glucose-Capillary: 117 mg/dL — ABNORMAL HIGH (ref 70–99)
Glucose-Capillary: 124 mg/dL — ABNORMAL HIGH (ref 70–99)
Glucose-Capillary: 134 mg/dL — ABNORMAL HIGH (ref 70–99)
Glucose-Capillary: 80 mg/dL (ref 70–99)
Glucose-Capillary: 84 mg/dL (ref 70–99)
Glucose-Capillary: 84 mg/dL (ref 70–99)

## 2023-04-29 LAB — COMPREHENSIVE METABOLIC PANEL
ALT: 20 U/L (ref 0–44)
AST: 25 U/L (ref 15–41)
Albumin: 2.5 g/dL — ABNORMAL LOW (ref 3.5–5.0)
Alkaline Phosphatase: 55 U/L (ref 38–126)
Anion gap: 8 (ref 5–15)
BUN: 9 mg/dL (ref 8–23)
CO2: 27 mmol/L (ref 22–32)
Calcium: 8 mg/dL — ABNORMAL LOW (ref 8.9–10.3)
Chloride: 98 mmol/L (ref 98–111)
Creatinine, Ser: 0.85 mg/dL (ref 0.61–1.24)
GFR, Estimated: >60 mL/min (ref >60)
Glucose, Bld: 150 mg/dL — ABNORMAL HIGH (ref 70–99)
Potassium: 5.3 mmol/L — ABNORMAL HIGH (ref 3.5–5.1)
Sodium: 133 mmol/L — ABNORMAL LOW (ref 135–145)
Total Bilirubin: 0.5 mg/dL (ref <1.2)
Total Protein: 5.4 g/dL — ABNORMAL LOW (ref 6.5–8.1)

## 2023-04-29 LAB — CBC
HCT: 28.6 % — ABNORMAL LOW (ref 39.0–52.0)
Hemoglobin: 8.4 g/dL — ABNORMAL LOW (ref 13.0–17.0)
MCH: 28.4 pg (ref 26.0–34.0)
MCHC: 29.4 g/dL — ABNORMAL LOW (ref 30.0–36.0)
MCV: 96.6 fL (ref 80.0–100.0)
Platelets: 209 10*3/uL (ref 150–400)
RBC: 2.96 MIL/uL — ABNORMAL LOW (ref 4.22–5.81)
RDW: 16 % — ABNORMAL HIGH (ref 11.5–15.5)
WBC: 136 10*3/uL (ref 4.0–10.5)
nRBC: 0 % (ref 0.0–0.2)

## 2023-04-29 NOTE — Plan of Care (Signed)
  Problem: Education: Goal: Knowledge of General Education information will improve Description Including pain rating scale, medication(s)/side effects and non-pharmacologic comfort measures Outcome: Progressing   Problem: Health Behavior/Discharge Planning: Goal: Ability to manage health-related needs will improve Outcome: Progressing   

## 2023-04-29 NOTE — Plan of Care (Signed)

## 2023-04-29 NOTE — Progress Notes (Signed)
PROGRESS NOTE    DERRECK FORTUNA  YQM:[id number] DOB: 02-Jun-1940 DOA: 04/26/2023 PCP: [name], MD   Brief Narrative: admitted 12/25 by pccm Trh pick up 61/42 82 year old male with history of CLL, hypertension, iron deficiency anemia, diarrhea, paroxysmal atrial fibrillation, combined diastolic systolic heart failure, hypothyroidism, failure to thrive, malnutrition, recent admission for septic shock readmitted with multilobar pneumonia on 04/26/2023.  He presented with complaints of fever chills rigors fatigue and shortness of breath and cough.  X-ray showed multifocal pneumonia.  CT of the abdomen and pelvis showed inflammatory changes surrounding the distal ileum and persistent foreign body for which general surgery was consulted recommended conservative management.  Patient was initially started on fluids without improvement in hypotension he was started on normal epinephrine drip with improvement in his blood pressure.  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   Weight as of this encounter: 65.5 kg.  DVT prophylaxis: eliquis Code Status:  dnr Family Communication:none Disposition Plan:  Status is: Inpatient Remains inpatient appropriate because: acute illness   Consultants: pccm  Procedures:none Antimicrobials:zosyn zyvox  Subjective:  Frail weak resting in bed  Objective: Vitals:   04/28/23 2007 04/29/23 0514 04/29/23 0558 04/29/23 0835  BP: 134/76 127/88  (!) 140/83  Pulse: 62 (!) 53  (!) 57  Resp: 16 16  16   Temp: 98 F (36.7 C) 97.7 F (36.5 C)  (!) 97.5 F (36.4 C)  TempSrc: Oral Oral  Oral  SpO2: 98% 98%  97%  Weight:   65.5 kg   Height:        Intake/Output Summary (Last 24 hours) at 04/29/2023 1309 Last data filed at 04/29/2023 0721 Gross per 24 hour  Intake 160 ml  Output 400 ml  Net -240 ml   Filed Weights   04/26/23 0805 04/28/23 0414 04/29/23 0558  Weight: 59.9 kg 67 kg 65.5 kg    Examination:  General exam: Appears in nad   Respiratory system: Coarse to auscultation. Respiratory effort normal. Cardiovascular system: S1 & S2 heard, RRR. No JVD, murmurs, rubs, gallops or clicks. No pedal edema. Gastrointestinal system: Abdomen is nondistended, soft and nontender. No organomegaly or masses felt. Normal bowel sounds heard. Central nervous system: Alert and oriented. No focal neurological deficits. Extremities: no edema  Data Reviewed: I have personally reviewed following labs and imaging studies  CBC: Recent Labs  Lab 04/26/23 0730 04/26/23 1020 04/27/23 0418 04/29/23 0520  WBC 176.0*  --  115.5* 136.0*  NEUTROABS 3.5  --   --   --   HGB 9.7* 9.2* 7.7* 8.4*  HCT 33.4* 27.0* 25.9* 28.6*  MCV 97.7  --  95.9 96.6  PLT 274  --  193 209   Basic Metabolic Panel: Recent Labs  Lab 04/26/23 0730 04/26/23 0842 04/26/23 1020 04/27/23 0418 04/27/23 1936 04/28/23 0710  NA 135 135 137 138  --  134*  K >7.5* 6.7* 4.3 4.4  --  5.9*  CL 99 101 99 104  --  101  CO2 25 24  --  28  --  25  GLUCOSE 133* 116* 105* 89  --  83  BUN 26* 24* 26* 22  --  14  CREATININE 1.12 1.02 1.00 0.96   --  0.86  CALCIUM 7.8* 7.5*  --  7.8*  --  7.7*  MG  --   --   --  1.1* 2.2 2.1  PHOS  --   --   --  3.6  --   --    GFR: Estimated Creatinine Clearance: 59.2 mL/min (by C-G formula based on SCr of 0.86 mg/dL). Liver Function Tests: Recent Labs  Lab 04/26/23 0730  AST 39  ALT 15  ALKPHOS 64  BILITOT 1.2*  PROT 5.7*  ALBUMIN 3.0*   No results for input(s): "LIPASE", "AMYLASE" in the last 168 hours. No results for input(s): "AMMONIA" in the last 168 hours. Coagulation Profile: Recent Labs  Lab 04/26/23 0730  INR 1.8*   Cardiac Enzymes: No results for input(s): "CKTOTAL", "CKMB", "CKMBINDEX", "TROPONINI" in the last 168 hours. BNP (last 3 results) No results for input(s): "PROBNP" in the last 8760 hours. HbA1C: No results for input(s): "HGBA1C" in the last 72 hours. CBG: Recent Labs  Lab 04/28/23 2006 04/29/23 0022 04/29/23 0513 04/29/23 0838 04/29/23 1205  GLUCAP 108* 117* 84 80 124*   Lipid Profile: No results for input(s): "CHOL", "HDL", "LDLCALC", "TRIG", "CHOLHDL", "LDLDIRECT" in the last 72 hours. Thyroid Function Tests: No results for input(s): "TSH", "T4TOTAL", "FREET4", "T3FREE", "THYROIDAB" in the last 72 hours. Anemia Panel: No results for input(s): "VITAMINB12", "FOLATE", "FERRITIN", "TIBC", "IRON", "RETICCTPCT" in the last 72 hours. Sepsis Labs: Recent Labs  Lab 04/26/23 0742 04/26/23 1016  LATICACIDVEN 1.1 0.7    Recent Results (from the past 240 hours)  Resp panel by RT-PCR (RSV, Flu A&B, Covid) Anterior Nasal Swab     Status: None   Collection Time: 04/26/23  7:18 AM   Specimen: Anterior Nasal Swab  Result Value Ref Range Status   SARS Coronavirus 2 by RT PCR NEGATIVE NEGATIVE Final   Influenza A by PCR NEGATIVE NEGATIVE Final   Influenza B by PCR NEGATIVE NEGATIVE Final    Comment: (NOTE) The Xpert Xpress SARS-CoV-2/FLU/RSV plus assay is intended as an aid in the diagnosis of influenza from Nasopharyngeal swab specimens and should not  be used as a sole basis for treatment. Nasal washings and aspirates are unacceptable for Xpert Xpress SARS-CoV-2/FLU/RSV testing.  Fact Sheet for Patients: BloggerCourse.com  Fact Sheet for Healthcare Providers: SeriousBroker.it  This test is not yet approved or cleared by the [place] FDA and has been authorized for detection and/or diagnosis of SARS-CoV-2 by FDA under an Emergency Use Authorization (EUA). This EUA will remain in effect (meaning this test can be used) for the duration of the COVID-19 declaration under Section 564(b)(1) of the Act, 21 U.S.C. section 360bbb-3(b)(1), unless the authorization is terminated or revoked.     Resp Syncytial Virus by PCR NEGATIVE NEGATIVE Final    Comment: (NOTE) Fact Sheet for Patients: BloggerCourse.com  Fact Sheet for Healthcare Providers: SeriousBroker.it  This test is not yet approved or  cleared by the [place] and has been authorized for detection and/or diagnosis of SARS-CoV-2 by FDA under an Emergency Use Authorization (EUA). This EUA will remain in effect (meaning this test can be used) for the duration of the COVID-19 declaration under Section 564(b)(1) of the Act, 21 U.S.C. section 360bbb-3(b)(1), unless the authorization is terminated or revoked.  Performed at [facility], [address] [place], [place] [address]   Blood Culture (routine x 2)     Status: Abnormal   Collection Time: 04/26/23  7:30 AM   Specimen: BLOOD  Result Value Ref Range Status   Specimen Description BLOOD PORTA CATH  Final   Special Requests   Final    BOTTLES DRAWN AEROBIC AND ANAEROBIC Blood Culture results may not be optimal due to an inadequate volume of blood received in culture bottles   Culture  Setup Time   Final    GRAM POSITIVE COCCI IN BOTH AEROBIC AND ANAEROBIC BOTTLES CRITICAL RESULT CALLED TO, READ BACK BY AND  VERIFIED WITH: PHARMD J FRANS [id number] AT 958 AM BY CM    Culture (A)  Final    STAPHYLOCOCCUS EPIDERMIDIS THE SIGNIFICANCE OF ISOLATING THIS ORGANISM FROM A SINGLE SET OF BLOOD CULTURES WHEN MULTIPLE SETS ARE DRAWN IS UNCERTAIN. PLEASE NOTIFY THE MICROBIOLOGY DEPARTMENT WITHIN ONE WEEK IF SPECIATION AND SENSITIVITIES ARE REQUIRED. Performed at [facility], [address] [place], [place] [address]    Report Status 04/29/2023 FINAL  Final  Blood Culture ID Panel (Reflexed)     Status: Abnormal   Collection Time: 04/26/23  7:30 AM  Result Value Ref Range Status   Enterococcus faecalis NOT DETECTED NOT DETECTED Final   Enterococcus Faecium NOT DETECTED NOT DETECTED Final   Listeria monocytogenes NOT DETECTED NOT DETECTED Final   Staphylococcus species DETECTED (A) NOT DETECTED Final    Comment: CRITICAL RESULT CALLED TO, READ BACK BY AND VERIFIED WITH: PHARMD J FRANS [id number] AT 958 AM BY CM    Staphylococcus aureus (BCID) NOT DETECTED NOT DETECTED Final   Staphylococcus epidermidis DETECTED (A) NOT DETECTED Final    Comment: Methicillin (oxacillin) resistant coagulase negative staphylococcus. Possible blood culture contaminant (unless isolated from more than one blood culture draw or clinical case suggests pathogenicity). No antibiotic treatment is indicated for blood  culture contaminants. CRITICAL RESULT CALLED TO, READ BACK BY AND VERIFIED WITH: PHARMD J FRANS [id number] AT 958 AM BY CM    Staphylococcus lugdunensis NOT DETECTED NOT DETECTED Final   Streptococcus species NOT DETECTED NOT DETECTED Final   Streptococcus agalactiae NOT DETECTED NOT DETECTED Final   Streptococcus pneumoniae NOT DETECTED NOT DETECTED Final   Streptococcus pyogenes NOT DETECTED NOT DETECTED Final   A.calcoaceticus-baumannii NOT DETECTED NOT DETECTED Final   Bacteroides fragilis NOT DETECTED NOT DETECTED Final   Enterobacterales NOT DETECTED NOT DETECTED Final   Enterobacter cloacae complex NOT DETECTED  NOT DETECTED Final   Escherichia coli NOT DETECTED NOT DETECTED Final   Klebsiella aerogenes NOT DETECTED NOT DETECTED Final   Klebsiella oxytoca NOT DETECTED NOT DETECTED Final   Klebsiella pneumoniae NOT DETECTED NOT DETECTED Final   Proteus species NOT DETECTED NOT DETECTED Final   Salmonella species NOT DETECTED NOT DETECTED Final   Serratia marcescens NOT DETECTED NOT DETECTED Final   Haemophilus influenzae NOT DETECTED NOT DETECTED Final   Neisseria meningitidis NOT DETECTED NOT DETECTED Final   Pseudomonas aeruginosa NOT DETECTED NOT DETECTED Final   Stenotrophomonas maltophilia NOT DETECTED NOT DETECTED Final  Candida albicans NOT DETECTED NOT DETECTED Final   Candida auris NOT DETECTED NOT DETECTED Final   Candida glabrata NOT DETECTED NOT DETECTED Final   Candida krusei NOT DETECTED NOT DETECTED Final   Candida parapsilosis NOT DETECTED NOT DETECTED Final   Candida tropicalis NOT DETECTED NOT DETECTED Final   Cryptococcus neoformans/gattii NOT DETECTED NOT DETECTED Final   Methicillin resistance mecA/C DETECTED (A) NOT DETECTED Final    Comment: CRITICAL RESULT CALLED TO, READ BACK BY AND VERIFIED WITH: PHARMD J FRANS [id number] AT 958 AM BY CM Performed at [facility], [address] [place], [place] [address]   Blood Culture (routine x 2)     Status: None (Preliminary result)   Collection Time: 04/26/23  7:52 AM   Specimen: BLOOD  Result Value Ref Range Status   Specimen Description BLOOD LEFT ANTECUBITAL  Final   Special Requests   Final    BOTTLES DRAWN AEROBIC AND ANAEROBIC Blood Culture results may not be optimal due to an inadequate volume of blood received in culture bottles   Culture   Final    NO GROWTH 3 DAYS Performed at [facility], [address] [place], [place] [address]    Report Status PENDING  Incomplete  MRSA Next Gen by PCR, Nasal     Status: Abnormal   Collection Time: 04/26/23 12:04 PM   Specimen: Nasal Mucosa; Nasal Swab  Result  Value Ref Range Status   MRSA by PCR Next Gen DETECTED (A) NOT DETECTED Final    Comment: (NOTE) The GeneXpert MRSA Assay (FDA approved for NASAL specimens only), is one component of a comprehensive MRSA colonization surveillance program. It is not intended to diagnose MRSA infection nor to guide or monitor treatment for MRSA infections. Test performance is not FDA approved in patients less than 5 years old. Performed at [facility], [address], [place], [place] [address]   Urine Culture     Status: Abnormal   Collection Time: 04/27/23  9:12 AM   Specimen: Urine, Random  Result Value Ref Range Status   Specimen Description URINE, RANDOM  Final   Special Requests   Final    NONE Performed at [facility], [address], [place], [place] [address]    Culture MULTIPLE SPECIES PRESENT, SUGGEST RECOLLECTION (A)  Final   Report Status 04/28/2023 FINAL  Final      Radiology Studies: No results found.   Scheduled Meds:  amiodarone  200 mg Oral Daily   apixaban  2.5 mg Oral BID   atorvastatin  20 mg Oral Daily   Chlorhexidine Gluconate Cloth  6 each Topical Q0600   cholestyramine light  4 g Oral BID   insulin aspart  0-6 Units Subcutaneous Q4H   levothyroxine  75 mcg Oral Q0600   linezolid  600 mg Oral Q12H   midodrine  5 mg Oral Q8H   mupirocin ointment  1 Application Nasal BID   rOPINIRole  1 mg Oral QHS   sodium chloride flush  3-10 mL Intravenous Q12H   Continuous Infusions:  piperacillin-tazobactam (ZOSYN)  IV 3.375 g (04/29/23 0843)     LOS: 3 days    Time spent: 38 min  Alwyn Ren, MD 04/29/2023, 1:09 PM

## 2023-04-29 NOTE — Progress Notes (Addendum)
Pharmacy Antibiotic Note  Fernando Young is a 82 y.o. male admitted on 04/26/2023 with sepsis secondary to pna vs IAI. Pt had recent admission requiring parenteral antibiotics and has had difficult recovering physically. Currently afebrile, WBC 136 secondary to h/o CLL. Pharmacy has been consulted for zosyn dosing. Blood cultures detected MRSE. Possible urinary source of infection, although urine culture contaminated with multiple species present.  Plan: Zosyn 3.375g IV q8h (4 hour infusion). Linezolid 600 mg PO every 12 hours Monitor renal function for dose adjustments F/u source of infection, duration of therapy, deescalation as able  Height: 6' (182.9 cm) Weight: 65.5 kg (144 lb 6.4 oz) IBW/kg (Calculated) : 77.6  Temp (24hrs), Avg:97.8 F (36.6 C), Min:97.6 F (36.4 C), Max:98 F (36.7 C)  Recent Labs  Lab 04/26/23 0730 04/26/23 0742 04/26/23 0842 04/26/23 1016 04/26/23 1020 04/27/23 0418 04/28/23 0710 04/29/23 0520  WBC 176.0*  --   --   --   --  115.5*  --  136.0*  CREATININE 1.12  --  1.02  --  1.00 0.96 0.86  --   LATICACIDVEN  --  1.1  --  0.7  --   --   --   --     Estimated Creatinine Clearance: 59.2 mL/min (by C-G formula based on SCr of 0.86 mg/dL).    No Known Allergies  Antimicrobials this admission: Cefepime 12/25 x1 Flagyl 12/25 x1 Vanc 12/25 x1 Zosyn 12/25 >> Linezolid 12/27 >>  Microbiology results: 12/25 MRSA PCR: positive 12/25 BCx: MRSE x1 12/26 Urince Cx: Multiple species present  Thank you for allowing pharmacy to be a part of this patient's care.  Enos Fling, PharmD PGY-1 Acute Care Pharmacy Resident 04/29/2023 7:32 AM

## 2023-04-30 DIAGNOSIS — Z66 Do not resuscitate: Secondary | ICD-10-CM

## 2023-04-30 DIAGNOSIS — A419 Sepsis, unspecified organism: Secondary | ICD-10-CM | POA: Diagnosis not present

## 2023-04-30 DIAGNOSIS — Z7189 Other specified counseling: Secondary | ICD-10-CM | POA: Diagnosis not present

## 2023-04-30 DIAGNOSIS — Z515 Encounter for palliative care: Secondary | ICD-10-CM

## 2023-04-30 DIAGNOSIS — R6521 Severe sepsis with septic shock: Secondary | ICD-10-CM | POA: Diagnosis not present

## 2023-04-30 LAB — BASIC METABOLIC PANEL
Anion gap: 6 (ref 5–15)
BUN: 11 mg/dL (ref 8–23)
CO2: 24 mmol/L (ref 22–32)
Calcium: 8 mg/dL — ABNORMAL LOW (ref 8.9–10.3)
Chloride: 99 mmol/L (ref 98–111)
Creatinine, Ser: 0.9 mg/dL (ref 0.61–1.24)
GFR, Estimated: >60 mL/min (ref >60)
Glucose, Bld: 163 mg/dL — ABNORMAL HIGH (ref 70–99)
Potassium: 6.5 mmol/L (ref 3.5–5.1)
Sodium: 129 mmol/L — ABNORMAL LOW (ref 135–145)

## 2023-04-30 LAB — COMPREHENSIVE METABOLIC PANEL
ALT: 18 U/L (ref 0–44)
AST: 31 U/L (ref 15–41)
Albumin: 2.5 g/dL — ABNORMAL LOW (ref 3.5–5.0)
Alkaline Phosphatase: 56 U/L (ref 38–126)
Anion gap: 5 (ref 5–15)
BUN: 11 mg/dL (ref 8–23)
CO2: 23 mmol/L (ref 22–32)
Calcium: 8 mg/dL — ABNORMAL LOW (ref 8.9–10.3)
Chloride: 100 mmol/L (ref 98–111)
Creatinine, Ser: 1.02 mg/dL (ref 0.61–1.24)
GFR, Estimated: >60 mL/min (ref >60)
Glucose, Bld: 119 mg/dL — ABNORMAL HIGH (ref 70–99)
Potassium: >7.5 mmol/L (ref 3.5–5.1)
Sodium: 128 mmol/L — ABNORMAL LOW (ref 135–145)
Total Bilirubin: 0.6 mg/dL (ref <1.2)
Total Protein: 5.1 g/dL — ABNORMAL LOW (ref 6.5–8.1)

## 2023-04-30 LAB — CBC
HCT: 37.2 % — ABNORMAL LOW (ref 39.0–52.0)
Hemoglobin: 10.7 g/dL — ABNORMAL LOW (ref 13.0–17.0)
MCH: 27.9 pg (ref 26.0–34.0)
MCHC: 28.8 g/dL — ABNORMAL LOW (ref 30.0–36.0)
MCV: 97.1 fL (ref 80.0–100.0)
Platelets: 294 10*3/uL (ref 150–400)
RBC: 3.83 MIL/uL — ABNORMAL LOW (ref 4.22–5.81)
RDW: 16.2 % — ABNORMAL HIGH (ref 11.5–15.5)
WBC: 206.5 10*3/uL (ref 4.0–10.5)
nRBC: 0 % (ref 0.0–0.2)

## 2023-04-30 LAB — GLUCOSE, CAPILLARY
Glucose-Capillary: 93 mg/dL (ref 70–99)
Glucose-Capillary: 82 mg/dL (ref 70–99)
Glucose-Capillary: 114 mg/dL — ABNORMAL HIGH (ref 70–99)
Glucose-Capillary: 120 mg/dL — ABNORMAL HIGH (ref 70–99)
Glucose-Capillary: 154 mg/dL — ABNORMAL HIGH (ref 70–99)
Glucose-Capillary: 147 mg/dL — ABNORMAL HIGH (ref 70–99)
Glucose-Capillary: 177 mg/dL — ABNORMAL HIGH (ref 70–99)

## 2023-04-30 LAB — POTASSIUM: Potassium: 6.7 mmol/L (ref 3.5–5.1)

## 2023-04-30 MED ORDER — CALCIUM GLUCONATE-NACL 1-0.675 GM/50ML-% IV SOLN
1.0000 g | Freq: Once | INTRAVENOUS | Status: AC
  Administered 2023-04-30: 1000 mg via INTRAVENOUS
  Filled 2023-04-30: qty 50

## 2023-04-30 MED ORDER — DEXTROSE 50 % IV SOLN
1.0000 | Freq: Once | INTRAVENOUS | Status: AC
  Administered 2023-04-30: 50 mL via INTRAVENOUS
  Filled 2023-04-30: qty 50

## 2023-04-30 MED ORDER — INSULIN ASPART 100 UNIT/ML IV SOLN
10.0000 [IU] | Freq: Once | INTRAVENOUS | Status: AC
  Administered 2023-04-30: 10 [IU] via INTRAVENOUS

## 2023-04-30 MED ORDER — SODIUM CHLORIDE 0.9 % IV SOLN
INTRAVENOUS | Status: AC
Start: 2023-04-30 — End: 2023-05-01

## 2023-04-30 MED ORDER — DEXTROSE 10 % IV SOLN: Freq: Once | INTRAVENOUS | Status: AC

## 2023-04-30 MED ORDER — FUROSEMIDE 10 MG/ML IJ SOLN
40.0000 mg | Freq: Once | INTRAMUSCULAR | Status: AC
  Administered 2023-04-30: 40 mg via INTRAVENOUS
  Filled 2023-04-30: qty 4

## 2023-04-30 MED ORDER — SODIUM ZIRCONIUM CYCLOSILICATE 10 G PO PACK
10.0000 g | PACK | Freq: Two times a day (BID) | ORAL | Status: DC
Start: 2023-04-30 — End: 2023-04-30
  Administered 2023-04-30: 10 g via ORAL

## 2023-04-30 MED ORDER — INSULIN ASPART 100 UNIT/ML IV SOLN
5.0000 [IU] | Freq: Once | INTRAVENOUS | Status: AC
  Administered 2023-04-30: 5 [IU] via INTRAVENOUS

## 2023-04-30 MED ORDER — DEXTROSE 50 % IV SOLN
50.0000 mL | Freq: Once | INTRAVENOUS | Status: AC
  Administered 2023-04-30: 50 mL via INTRAVENOUS
  Filled 2023-04-30: qty 50

## 2023-04-30 MED ORDER — SODIUM CHLORIDE 0.9% FLUSH: 10.0000 mL | INTRAVENOUS | Status: DC | PRN

## 2023-04-30 MED ORDER — SODIUM ZIRCONIUM CYCLOSILICATE 10 G PO PACK
10.0000 g | PACK | Freq: Three times a day (TID) | ORAL | Status: DC
  Administered 2023-04-30 – 2023-05-01 (×4): 10 g via ORAL
  Filled 2023-04-30 (×5): qty 1

## 2023-04-30 NOTE — Progress Notes (Signed)
°   04/30/23 0840  Mobility  Activity Transferred from bed to chair  Level of Assistance Minimal assist, patient does 75% or more  Assistive Device Front wheel walker  Distance Ambulated (ft) 4 ft  Activity Response Tolerated well  Mobility Referral Yes  Mobility visit 1 Mobility  Mobility Specialist Start Time (ACUTE ONLY) 0830  Mobility Specialist Stop Time (ACUTE ONLY) 0835  Mobility Specialist Time Calculation (min) (ACUTE ONLY) 5 min   Pt received in bed, requesting assistance to leave bed for linen change. MinA required to stand with RW. Tolerated well, asx throughout. Left pt in care of palliative RN, chair alarm on, all needs met.   Feliciana Rossetti Mobility Specialist Please contact via Special educational needs teacher or  Rehab office at [phone]

## 2023-04-30 NOTE — Consult Note (Addendum)
Palliative Medicine Inpatient Consult Note  Consulting Provider:  Charlott Holler, MD   Reason for consult:   Palliative Care Consult Services Palliative Medicine Consult  Reason for Consult? goals of care   Called patient's daughter [name]. She'd like to set up a meeting on 12/27 when she and her husband will be up here. Want to clarify some end of life goals, priorities, consider hospice.   04/30/2023  HPI:  Per intake H&P --> 82 year old male with history of CLL, hypertension, iron deficiency anemia, diarrhea, paroxysmal atrial fibrillation, combined diastolic systolic heart failure, hypothyroidism, failure to thrive, malnutrition, recent admission for septic shock readmitted with multilobar pneumonia.   Palliative care asked to get involved to discuss goals of care in the setting of acute illness.  Clinical Assessment/Goals of Care:  *Please note that this is a verbal dictation therefore any spelling or grammatical errors are due to the "Dragon Medical One" system interpretation.  I have reviewed medical records including EPIC notes, labs and imaging, received report from bedside RN, assessed the patient who is lying in bed agitated that he is wet.  I was able to get the mobility aid and helped Rayder to get out of bed to the chair as well as clean him up.    I met with Fidela Juneau to further discuss diagnosis prognosis, GOC, EOL wishes, disposition and options.   I introduced Palliative Medicine as specialized medical care for people living with serious illness. It focuses on providing relief from the symptoms and stress of a serious illness. The goal is to improve quality of life for both the patient and the family.  Medical History Review and Understanding:  I reviewed with Jameses past medical history inclusive of hypertension, iron deficiency anemia, paroxysmal atrial fibrillation, systolic/diastolic heart failure, hypothyroidism, and CLL.  We discussed the reason for  hospitalization inclusive of septic shock and pneumonia.  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  He shares that he is a widower.  He has a son and a daughter.  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  He used to enjoy traveling.  He shares that unfortunately due to his declining health he does not have much in life he gets great joy out of any longer.  He is a man of faith.  Functional and Nutritional State:  Prior to hospitalization Shriram had been hospitalized multiple times leading to him moving in with his daughter who is a former Lawyer.  She helps him with all B ADLs.  Khareem does mobilize with a front wheel walker.  Advance Directives:  A detailed discussion was had today regarding advanced directives.  Dock does have advanced directives which are on file his surrogate decision maker is his daughter, Mitchell Heir.  Code Status: Concepts specific to code status, artifical feeding and hydration, continued IV antibiotics and rehospitalization was had.  The difference between a aggressive medical intervention path  and a palliative comfort care path for this patient at this time was had.   Romel is a DO NOT RESUSCITATE CODE STATUS.  Discussion:  I reviewed with Nnamdi his precipitous decline over the past few months in the setting of recurrent rehospitalization's.  We talked about mortality, death, and dying.  Council shares that he understands that his life will be shortened though if he could improve from a physical and functional perspective he would desire that.  We did discuss hospice  in great detail and should he continue to decline that being a reasonable next step for consideration.  Council will be open to hospice if he does not improve once he gets home and he prefers [facility].  Discussed the importance of continued conversation with family and their  medical providers regarding overall plan of care and  treatment options, ensuring decisions are within the context of the patients values and GOCs. _________________________ Addendum:  I was able to speak to patient's daughter, [name] who shares she is seen her father's declining health state.  We reviewed the chronic disease trajectory in patients who have multiple co-morbidities. I shared that often an event will occur leading to an  acute hospitalizationsuch as a fall, UTI, PNA, heart failure exacerbation, copd exacerbation, or another illness sort. We discussed that patients may have been functioning at a high plateau initially, then an acute event occurs. We discussed that after this event their function, mental, and nutritional states are compromised. Often with treatment and rehabilitation there is some regain in each individuals health, though often not to their prior baseline level. We discussed that then another event will occur causing a rehospitalization and a further decline. I shared that often this will become a pattern and each event causes greater burden to the individual, depleting them further or their function, cognition, or nutritional state.   We talked about when initiation of hospice would be appropriate.  We discussed it would be worth consideration at this time though Colburn's goals are to gain strength.  I was able to speak to patient's daughter about how to institute hospice services should he neglect to improve.  I shared that I would call [facility] so that they would look out for his symptom burden and have those conversations when appropriate.  Patient's daughter shares good awareness of hospice as she cared for her mother through her end-of-life journey.  She finds that there are great service that can offer robust support when needed.  Patient's daughter is in agreement with patient discharging home with [facility] services and if he neglects to thrive transitioning him to [facility].  Decision  Maker: Derrill Kay (Daughter): [phone] (Mobile)   SUMMARY OF RECOMMENDATIONS   DNAR  Will complete MOST prior to discharge  Open and honest conversations held in the setting of patient's acute on chronic disease burden, rehospitalization's, and high mortality risk  Plan for outpatient home health though if patient does not thrive transitioning him to [facility] hospice services --> I plan to call them Monday to alert them of this  Ongoing palliative care support  Code Status/Advance Care Planning: DNAR  Palliative Prophylaxis:  Aspiration, Bowel Regimen, Delirium Protocol, Frequent Pain Assessment, Oral Care, Palliative Wound Care, and Turn Reposition  Additional Recommendations (Limitations, Scope, Preferences): Continue current care  Psycho-social/Spiritual:  Desire for further Chaplaincy support: Yes Additional Recommendations: Education on chronic disease   Prognosis: High 74-month mortality risk  Discharge Planning: Discharge plan to patient's daughter's home with home health Vitals:   04/29/23 2021 04/30/23 0443  BP: 125/82 115/74  Pulse: 63 (!) 56  Resp: 18 18  Temp: 97.9 F (36.6 C) 97.8 F (36.6 C)  SpO2: 99% 98%    Intake/Output Summary (Last 24 hours) at 04/30/2023 0717 Last data filed at 04/29/2023 8295 Gross per 24 hour  Intake 100 ml  Output --  Net 100 ml   Last Weight  Most recent update: 04/30/2023  6:01 AM    Weight  67.5 kg (148  lb 13 oz)            Gen: Elderly Caucasian male chronically ill-appearing HEENT: moist mucous membranes CV: Regular rate and irregular rhythm PULM: On room air breathing is even and nonlabored ABD: soft/nontender EXT: Decreased muscular tone in all 4 extremities , (-) edema Neuro: Alert and oriented x3 hard of hearing  PPS: 40-50%   This conversation/these recommendations were discussed with patient primary care team, Dr. Jerolyn Center  Billing based on MDM: High  Problems Addressed: One acute or  chronic illness or injury that poses a threat to life or bodily function  Amount and/or Complexity of Data: Category 3:Discussion of management or test interpretation with external physician/other qualified health care professional/appropriate source (not separately reported)  Risks: Decision regarding hospitalization or escalation of hospital care ______________________________________________________ Lamarr Lulas [facility] Palliative Medicine Team Team Cell Phone: [phone] Please utilize secure chat with additional questions, if there is no response within 30 minutes please call the above phone number  Palliative Medicine Team providers are available by phone from 7am to 7pm daily and can be reached through the team cell phone.  Should this patient require assistance outside of these hours, please call the patient's attending physician.

## 2023-04-30 NOTE — Progress Notes (Signed)
Mobility Specialist Progress Note:   04/30/23 1144  Mobility  Activity Transferred from chair to bed  Level of Assistance Moderate assist, patient does 50-74% (+2)  Assistive Device Front wheel walker  Distance Ambulated (ft) 4 ft  Activity Response Tolerated well  Mobility Referral Yes  Mobility visit 1 Mobility  Mobility Specialist Start Time (ACUTE ONLY) 1130  Mobility Specialist Stop Time (ACUTE ONLY) 1138  Mobility Specialist Time Calculation (min) (ACUTE ONLY) 8 min   Pt requested assistance to transfer C>B in order to use bed pan. Required ModA+2 via RW to transfer. Tolerated well, asx throughout. Left pt in bed, NT in room, all needs met.   Feliciana Rossetti Mobility Specialist Please contact via Special educational needs teacher or  Rehab office at [phone]

## 2023-04-30 NOTE — Progress Notes (Signed)
PROGRESS NOTE    Fernando Young  GNF:[id number] DOB: 1941/03/21 DOA: 04/26/2023 PCP: [name], MD   Brief Narrative: admitted 12/25 by pccm Trh pick up 74/37 82 year old male with history of CLL, hypertension, iron deficiency anemia, diarrhea, paroxysmal atrial fibrillation, combined diastolic systolic heart failure, hypothyroidism, failure to thrive, malnutrition, recent admission for septic shock readmitted with multilobar pneumonia on 04/26/2023.  He presented with complaints of fever chills rigors fatigue and shortness of breath and cough.  X-ray showed multifocal pneumonia.  CT of the abdomen and pelvis showed inflammatory changes surrounding the distal ileum and persistent foreign body for which general surgery was consulted recommended conservative management.  Patient was initially started on fluids without improvement in hypotension he was started on normal epinephrine drip with improvement in his blood pressure.  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  Assessment & Plan:   Principal Problem:   Septic shock (HCC) Active Problems:   FTT (failure to thrive) in adult   Atrial fibrillation (HCC)   #1 septic shock secondary to multilobar pneumonia on Zosyn and Zyvox.  Patient is on room air and is able to maintain his oxygen saturation.  Very frail and chronically ill-appearing.  Plan is for him to be discharged home with his daughter early next week.  Continue nebs out of bed as tolerated. Appreciate  palliative care input.  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   Estimated body mass index is 20.18 kg/m as calculated from the following:   Height as of this encounter: 6' (1.829 m).   Weight  as of this encounter: 67.5 kg.  DVT prophylaxis: eliquis Code Status: dnr Family Communication: Discussed with daughter Disposition Plan:  Status is: Inpatient Remains inpatient appropriate because: acute illness   Consultants: pccm  Procedures:none Antimicrobials:zosyn zyvox  Subjective: No new complaints he appears very weak and frail but able to answer questions appropriately and follow commands Objective: Vitals:   04/29/23 2021 04/30/23 0443 04/30/23 0500 04/30/23 0751  BP: 125/82 115/74  125/76  Pulse: 63 (!) 56  (!) 58  Resp: 18 18  17   Temp: 97.9 F (36.6 C) 97.8 F (36.6 C)  97.8 F (36.6 C)  TempSrc: Oral Oral  Oral  SpO2: 99% 98%  98%  Weight:   67.5 kg   Height:       No intake or output data in the 24 hours ending 04/30/23 1153  Filed Weights   04/28/23 0414 04/29/23 0558 04/30/23 0500  Weight: 67 kg 65.5 kg 67.5 kg    Examination:  General exam: Appears in nad   Respiratory system: Coarse to auscultation. Respiratory effort normal. Cardiovascular system: S1 & S2 heard, RRR. No JVD, murmurs, rubs, gallops or clicks. No pedal edema. Gastrointestinal system: Abdomen is nondistended, soft and nontender. No organomegaly or masses felt. Normal bowel sounds heard. Central nervous system: Alert and oriented. No focal neurological deficits. Extremities: no edema  Data Reviewed: I have personally reviewed following labs and imaging studies  CBC: Recent Labs  Lab 04/26/23 0730 04/26/23 1020 04/27/23 0418 04/29/23 0520 04/30/23 1037  WBC 176.0*  --  115.5* 136.0* 206.5*  NEUTROABS 3.5  --   --   --   --  HGB 9.7* 9.2* 7.7* 8.4* 10.7*  HCT 33.4* 27.0* 25.9* 28.6* 37.2*  MCV 97.7  --  95.9 96.6 97.1  PLT 274  --  193 209 294   Basic Metabolic Panel: Recent Labs  Lab 04/26/23 0730 04/26/23 0842 04/26/23 1020 04/27/23 0418 04/27/23 1936 04/28/23 0710 04/29/23 1408  NA 135 135 137 138  --  134* 133*  K >7.5* 6.7* 4.3 4.4  --  5.9* 5.3*  CL 99  101 99 104  --  101 98  CO2 25 24  --  28  --  25 27  GLUCOSE 133* 116* 105* 89  --  83 150*  BUN 26* 24* 26* 22  --  14 9  CREATININE 1.12 1.02 1.00 0.96  --  0.86 0.85  CALCIUM 7.8* 7.5*  --  7.8*  --  7.7* 8.0*  MG  --   --   --  1.1* 2.2 2.1  --   PHOS  --   --   --  3.6  --   --   --    GFR: Estimated Creatinine Clearance: 61.8 mL/min (by C-G formula based on SCr of 0.85 mg/dL). Liver Function Tests: Recent Labs  Lab 04/26/23 0730 04/29/23 1408  AST 39 25  ALT 15 20  ALKPHOS 64 55  BILITOT 1.2* 0.5  PROT 5.7* 5.4*  ALBUMIN 3.0* 2.5*   No results for input(s): "LIPASE", "AMYLASE" in the last 168 hours. No results for input(s): "AMMONIA" in the last 168 hours. Coagulation Profile: Recent Labs  Lab 04/26/23 0730  INR 1.8*   Cardiac Enzymes: No results for input(s): "CKTOTAL", "CKMB", "CKMBINDEX", "TROPONINI" in the last 168 hours. BNP (last 3 results) No results for input(s): "PROBNP" in the last 8760 hours. HbA1C: No results for input(s): "HGBA1C" in the last 72 hours. CBG: Recent Labs  Lab 04/29/23 1205 04/29/23 1659 04/29/23 2024 04/30/23 0443 04/30/23 0752  GLUCAP 124* 134* 84 93 82   Lipid Profile: No results for input(s): "CHOL", "HDL", "LDLCALC", "TRIG", "CHOLHDL", "LDLDIRECT" in the last 72 hours. Thyroid Function Tests: No results for input(s): "TSH", "T4TOTAL", "FREET4", "T3FREE", "THYROIDAB" in the last 72 hours. Anemia Panel: No results for input(s): "VITAMINB12", "FOLATE", "FERRITIN", "TIBC", "IRON", "RETICCTPCT" in the last 72 hours. Sepsis Labs: Recent Labs  Lab 04/26/23 0742 04/26/23 1016  LATICACIDVEN 1.1 0.7    Recent Results (from the past 240 hours)  Resp panel by RT-PCR (RSV, Flu A&B, Covid) Anterior Nasal Swab     Status: None   Collection Time: 04/26/23  7:18 AM   Specimen: Anterior Nasal Swab  Result Value Ref Range Status   SARS Coronavirus 2 by RT PCR NEGATIVE NEGATIVE Final   Influenza A by PCR NEGATIVE NEGATIVE Final    Influenza B by PCR NEGATIVE NEGATIVE Final    Comment: (NOTE) The Xpert Xpress SARS-CoV-2/FLU/RSV plus assay is intended as an aid in the diagnosis of influenza from Nasopharyngeal swab specimens and should not be used as a sole basis for treatment. Nasal washings and aspirates are unacceptable for Xpert Xpress SARS-CoV-2/FLU/RSV testing.  Fact Sheet for Patients: BloggerCourse.com  Fact Sheet for Healthcare Providers: SeriousBroker.it  This test is not yet approved or cleared by the [place] FDA and has been authorized for detection and/or diagnosis of SARS-CoV-2 by FDA under an Emergency Use Authorization (EUA). This EUA will remain in effect (meaning this test can be used) for the duration of the COVID-19 declaration under Section 564(b)(1) of the Act, 21 U.S.C. section  360bbb-3(b)(1), unless the authorization is terminated or revoked.     Resp Syncytial Virus by PCR NEGATIVE NEGATIVE Final    Comment: (NOTE) Fact Sheet for Patients: BloggerCourse.com  Fact Sheet for Healthcare Providers: SeriousBroker.it  This test is not yet approved or cleared by the [place] FDA and has been authorized for detection and/or diagnosis of SARS-CoV-2 by FDA under an Emergency Use Authorization (EUA). This EUA will remain in effect (meaning this test can be used) for the duration of the COVID-19 declaration under Section 564(b)(1) of the Act, 21 U.S.C. section 360bbb-3(b)(1), unless the authorization is terminated or revoked.  Performed at [facility], [address], [place], [place] [address]   Blood Culture (routine x 2)     Status: Abnormal   Collection Time: 04/26/23  7:30 AM   Specimen: BLOOD  Result Value Ref Range Status   Specimen Description BLOOD PORTA CATH  Final   Special Requests   Final    BOTTLES DRAWN AEROBIC AND ANAEROBIC Blood Culture results may not  be optimal due to an inadequate volume of blood received in culture bottles   Culture  Setup Time   Final    GRAM POSITIVE COCCI IN BOTH AEROBIC AND ANAEROBIC BOTTLES CRITICAL RESULT CALLED TO, READ BACK BY AND VERIFIED WITH: PHARMD J FRANS [id number] AT 958 AM BY CM    Culture (A)  Final    STAPHYLOCOCCUS EPIDERMIDIS THE SIGNIFICANCE OF ISOLATING THIS ORGANISM FROM A SINGLE SET OF BLOOD CULTURES WHEN MULTIPLE SETS ARE DRAWN IS UNCERTAIN. PLEASE NOTIFY THE MICROBIOLOGY DEPARTMENT WITHIN ONE WEEK IF SPECIATION AND SENSITIVITIES ARE REQUIRED. Performed at [facility], [address], [place], [place] [address]    Report Status 04/29/2023 FINAL  Final  Blood Culture ID Panel (Reflexed)     Status: Abnormal   Collection Time: 04/26/23  7:30 AM  Result Value Ref Range Status   Enterococcus faecalis NOT DETECTED NOT DETECTED Final   Enterococcus Faecium NOT DETECTED NOT DETECTED Final   Listeria monocytogenes NOT DETECTED NOT DETECTED Final   Staphylococcus species DETECTED (A) NOT DETECTED Final    Comment: CRITICAL RESULT CALLED TO, READ BACK BY AND VERIFIED WITH: PHARMD J FRANS [id number] AT 958 AM BY CM    Staphylococcus aureus (BCID) NOT DETECTED NOT DETECTED Final   Staphylococcus epidermidis DETECTED (A) NOT DETECTED Final    Comment: Methicillin (oxacillin) resistant coagulase negative staphylococcus. Possible blood culture contaminant (unless isolated from more than one blood culture draw or clinical case suggests pathogenicity). No antibiotic treatment is indicated for blood  culture contaminants. CRITICAL RESULT CALLED TO, READ BACK BY AND VERIFIED WITH: PHARMD J FRANS [id number] AT 958 AM BY CM    Staphylococcus lugdunensis NOT DETECTED NOT DETECTED Final   Streptococcus species NOT DETECTED NOT DETECTED Final   Streptococcus agalactiae NOT DETECTED NOT DETECTED Final   Streptococcus pneumoniae NOT DETECTED NOT DETECTED Final   Streptococcus pyogenes NOT DETECTED NOT DETECTED Final    A.calcoaceticus-baumannii NOT DETECTED NOT DETECTED Final   Bacteroides fragilis NOT DETECTED NOT DETECTED Final   Enterobacterales NOT DETECTED NOT DETECTED Final   Enterobacter cloacae complex NOT DETECTED NOT DETECTED Final   Escherichia coli NOT DETECTED NOT DETECTED Final   Klebsiella aerogenes NOT DETECTED NOT DETECTED Final   Klebsiella oxytoca NOT DETECTED NOT DETECTED Final   Klebsiella pneumoniae NOT DETECTED NOT DETECTED Final   Proteus species NOT DETECTED NOT DETECTED Final   Salmonella species NOT DETECTED NOT DETECTED Final  Serratia marcescens NOT DETECTED NOT DETECTED Final   Haemophilus influenzae NOT DETECTED NOT DETECTED Final   Neisseria meningitidis NOT DETECTED NOT DETECTED Final   Pseudomonas aeruginosa NOT DETECTED NOT DETECTED Final   Stenotrophomonas maltophilia NOT DETECTED NOT DETECTED Final   Candida albicans NOT DETECTED NOT DETECTED Final   Candida auris NOT DETECTED NOT DETECTED Final   Candida glabrata NOT DETECTED NOT DETECTED Final   Candida krusei NOT DETECTED NOT DETECTED Final   Candida parapsilosis NOT DETECTED NOT DETECTED Final   Candida tropicalis NOT DETECTED NOT DETECTED Final   Cryptococcus neoformans/gattii NOT DETECTED NOT DETECTED Final   Methicillin resistance mecA/C DETECTED (A) NOT DETECTED Final    Comment: CRITICAL RESULT CALLED TO, READ BACK BY AND VERIFIED WITH: PHARMD J FRANS [id number] AT 958 AM BY CM Performed at [facility], [address] [place], [place] [address]   Blood Culture (routine x 2)     Status: None (Preliminary result)   Collection Time: 04/26/23  7:52 AM   Specimen: BLOOD  Result Value Ref Range Status   Specimen Description BLOOD LEFT ANTECUBITAL  Final   Special Requests   Final    BOTTLES DRAWN AEROBIC AND ANAEROBIC Blood Culture results may not be optimal due to an inadequate volume of blood received in culture bottles   Culture   Final    NO GROWTH 4 DAYS Performed at [facility],  [address], [place], [place] [address]    Report Status PENDING  Incomplete  MRSA Next Gen by PCR, Nasal     Status: Abnormal   Collection Time: 04/26/23 12:04 PM   Specimen: Nasal Mucosa; Nasal Swab  Result Value Ref Range Status   MRSA by PCR Next Gen DETECTED (A) NOT DETECTED Final    Comment: (NOTE) The GeneXpert MRSA Assay (FDA approved for NASAL specimens only), is one component of a comprehensive MRSA colonization surveillance program. It is not intended to diagnose MRSA infection nor to guide or monitor treatment for MRSA infections. Test performance is not FDA approved in patients less than 71 years old. Performed at [facility], [address], [place], [place] [address]   Urine Culture     Status: Abnormal   Collection Time: 04/27/23  9:12 AM   Specimen: Urine, Random  Result Value Ref Range Status   Specimen Description URINE, RANDOM  Final   Special Requests   Final    NONE Performed at [facility], [address] [place], [place] [address]    Culture MULTIPLE SPECIES PRESENT, SUGGEST RECOLLECTION (A)  Final   Report Status 04/28/2023 FINAL  Final      Radiology Studies: No results found.   Scheduled Meds:  amiodarone  200 mg Oral Daily   apixaban  2.5 mg Oral BID   atorvastatin  20 mg Oral Daily   Chlorhexidine Gluconate Cloth  6 each Topical Q0600   cholestyramine light  4 g Oral BID   insulin aspart  0-6 Units Subcutaneous Q4H   levothyroxine  75 mcg Oral Q0600   linezolid  600 mg Oral Q12H   midodrine  5 mg Oral Q8H   mupirocin ointment  1 Application Nasal BID   rOPINIRole  1 mg Oral QHS   sodium chloride flush  3-10 mL Intravenous Q12H   Continuous Infusions:  piperacillin-tazobactam (ZOSYN)  IV 3.375 g (04/30/23 0950)     LOS: 4 days    Time spent: 38 min  Alwyn Ren, MD 04/30/2023, 11:53 AM

## 2023-05-01 DIAGNOSIS — R6521 Severe sepsis with septic shock: Secondary | ICD-10-CM | POA: Diagnosis not present

## 2023-05-01 DIAGNOSIS — A419 Sepsis, unspecified organism: Secondary | ICD-10-CM | POA: Diagnosis not present

## 2023-05-01 LAB — CULTURE, BLOOD (ROUTINE X 2): Culture: NO GROWTH

## 2023-05-01 LAB — GLUCOSE, CAPILLARY
Glucose-Capillary: 107 mg/dL — ABNORMAL HIGH (ref 70–99)
Glucose-Capillary: 121 mg/dL — ABNORMAL HIGH (ref 70–99)
Glucose-Capillary: 131 mg/dL — ABNORMAL HIGH (ref 70–99)
Glucose-Capillary: 164 mg/dL — ABNORMAL HIGH (ref 70–99)
Glucose-Capillary: 84 mg/dL (ref 70–99)
Glucose-Capillary: 86 mg/dL (ref 70–99)
Glucose-Capillary: 92 mg/dL (ref 70–99)

## 2023-05-01 LAB — BASIC METABOLIC PANEL
Anion gap: 8 (ref 5–15)
Anion gap: 10 (ref 5–15)
BUN: 11 mg/dL (ref 8–23)
BUN: 12 mg/dL (ref 8–23)
CO2: 24 mmol/L (ref 22–32)
CO2: 21 mmol/L — ABNORMAL LOW (ref 22–32)
Calcium: 8.4 mg/dL — ABNORMAL LOW (ref 8.9–10.3)
Calcium: 8.3 mg/dL — ABNORMAL LOW (ref 8.9–10.3)
Chloride: 99 mmol/L (ref 98–111)
Chloride: 101 mmol/L (ref 98–111)
Creatinine, Ser: 1.04 mg/dL (ref 0.61–1.24)
Creatinine, Ser: 1.17 mg/dL (ref 0.61–1.24)
GFR, Estimated: >60 mL/min (ref >60)
GFR, Estimated: >60 mL/min (ref >60)
Glucose, Bld: 84 mg/dL (ref 70–99)
Glucose, Bld: 86 mg/dL (ref 70–99)
Potassium: 7.2 mmol/L (ref 3.5–5.1)
Potassium: 6.8 mmol/L (ref 3.5–5.1)
Sodium: 131 mmol/L — ABNORMAL LOW (ref 135–145)
Sodium: 132 mmol/L — ABNORMAL LOW (ref 135–145)

## 2023-05-01 LAB — POTASSIUM
Potassium: 6.1 mmol/L — ABNORMAL HIGH (ref 3.5–5.1)
Potassium: 5.2 mmol/L — ABNORMAL HIGH (ref 3.5–5.1)
Potassium: 5.3 mmol/L — ABNORMAL HIGH (ref 3.5–5.1)
Potassium: 6.9 mmol/L (ref 3.5–5.1)

## 2023-05-01 LAB — LACTIC ACID, PLASMA: Lactic Acid, Venous: 1.1 mmol/L (ref 0.5–1.9)

## 2023-05-01 LAB — LACTATE DEHYDROGENASE: LDH: 239 U/L — ABNORMAL HIGH (ref 98–192)

## 2023-05-01 LAB — CORTISOL: Cortisol, Plasma: 19.3 ug/dL

## 2023-05-01 MED ORDER — SODIUM BICARBONATE 8.4 % IV SOLN
100.0000 meq | Freq: Once | INTRAVENOUS | Status: AC
  Administered 2023-05-01: 100 meq via INTRAVENOUS
  Filled 2023-05-01: qty 50

## 2023-05-01 MED ORDER — DEXTROSE 50 % IV SOLN
50.0000 mL | Freq: Once | INTRAVENOUS | Status: AC
  Administered 2023-05-01: 50 mL via INTRAVENOUS
  Filled 2023-05-01: qty 50

## 2023-05-01 MED ORDER — CALCIUM GLUCONATE-NACL 1-0.675 GM/50ML-% IV SOLN
1.0000 g | Freq: Once | INTRAVENOUS | Status: AC
  Administered 2023-05-01: 1000 mg via INTRAVENOUS
  Filled 2023-05-01: qty 50

## 2023-05-01 MED ORDER — SODIUM BICARBONATE 8.4 % IV SOLN
50.0000 meq | Freq: Once | INTRAVENOUS | Status: AC
  Administered 2023-05-01: 50 meq via INTRAVENOUS
  Filled 2023-05-01: qty 50

## 2023-05-01 MED ORDER — SODIUM BICARBONATE 8.4 % IV SOLN
INTRAVENOUS | Status: AC
  Filled 2023-05-01: qty 50

## 2023-05-01 MED ORDER — SODIUM POLYSTYRENE SULFONATE 15 GM/60ML CO SUSP
45.0000 g | Freq: Once | Status: AC
  Administered 2023-05-01: 45 g via RECTAL
  Filled 2023-05-01: qty 180

## 2023-05-01 MED ORDER — INSULIN ASPART 100 UNIT/ML IV SOLN
10.0000 [IU] | Freq: Once | INTRAVENOUS | Status: AC
  Administered 2023-05-01: 10 [IU] via INTRAVENOUS

## 2023-05-01 MED ORDER — LINEZOLID 600 MG PO TABS
600.0000 mg | ORAL_TABLET | Freq: Two times a day (BID) | ORAL | Status: DC
  Administered 2023-05-01 – 2023-05-02 (×3): 600 mg via ORAL
  Filled 2023-05-01 (×2): qty 1

## 2023-05-01 MED ORDER — ALBUTEROL SULFATE (2.5 MG/3ML) 0.083% IN NEBU
2.5000 mg | INHALATION_SOLUTION | Freq: Once | RESPIRATORY_TRACT | Status: AC
  Administered 2023-05-01: 2.5 mg via RESPIRATORY_TRACT
  Filled 2023-05-01: qty 3

## 2023-05-01 MED ORDER — FUROSEMIDE 10 MG/ML IJ SOLN
40.0000 mg | Freq: Once | INTRAMUSCULAR | Status: AC
  Administered 2023-05-01: 40 mg via INTRAVENOUS
  Filled 2023-05-01: qty 4

## 2023-05-01 MED ORDER — MIDODRINE HCL 5 MG PO TABS
10.0000 mg | ORAL_TABLET | Freq: Three times a day (TID) | ORAL | Status: DC
Start: 2023-05-01 — End: 2023-05-02
  Administered 2023-05-02 (×3): 10 mg via ORAL
  Filled 2023-05-01 (×3): qty 2

## 2023-05-01 MED ORDER — SODIUM POLYSTYRENE SULFONATE 15 GM/60ML CO SUSP
45.0000 g | Freq: Once | Status: AC
  Administered 2023-05-02: 45 g via RECTAL
  Filled 2023-05-01: qty 180

## 2023-05-01 NOTE — TOC Progression Note (Signed)
Transition of Care [facility]) - Progression Note    Patient Details  Name: Fernando Young MRN: [id number] Date of Birth: 01/14/1941  Transition of Care [facility]) CM/SW Contact  Janae Bridgeman, RN Phone Number: 05/01/2023, 12:15 PM  Clinical Narrative:    CM spoke with Douglass Rivers, NP with Palliative Care and patient's family would like to continue to have home health services, but if patient does not thrive in the home - may want to consider home hospice services.  I reached out to Bonnye Fava, CM with [facility] and she is now aware and will have the Palliative Team will [facility] be involved.        Expected Discharge Plan and Services                                               Social Determinants of Health (SDOH) Interventions SDOH Screenings   Food Insecurity: No Food Insecurity (04/29/2023)  Housing: Unknown (04/29/2023)  Transportation Needs: No Transportation Needs (04/29/2023)  Utilities: Not At Risk (04/29/2023)  Financial Resource Strain: Low Risk  (04/18/2022)   Received from [facility], [facility]  Physical Activity: Sufficiently Active (04/18/2022)   Received from [facility], [facility]  Social Connections: Moderately Isolated (04/18/2022)   Received from [facility], [facility]  Stress: No Stress Concern Present (04/18/2022)   Received from [facility], [facility]  Tobacco Use: Medium Risk (04/26/2023)  Health Literacy: Medium Risk (04/18/2022)   Received from [facility], [facility]    Readmission Risk Interventions    04/28/2023   11:47 AM 04/06/2023    3:00 PM  Readmission Risk Prevention Plan  Transportation Screening Complete Complete  PCP or Specialist Appt within 5-7 Days  Complete  PCP or Specialist Appt within 3-5 Days Complete   Home Care Screening  Complete  Medication Review (RN CM)  Complete  HRI or Home Care Consult Complete   Social Work Consult for Recovery Care  Planning/Counseling Complete   Palliative Care Screening Complete   Medication Review Oceanographer) Complete

## 2023-05-01 NOTE — Progress Notes (Signed)
Pt has received 5d of zosyn so ok to stop and continue linezolid to complete 5d per Dr. Ashley Royalty.  Ulyses Southward, PharmD, BCIDP, AAHIVP, CPP Infectious Disease Pharmacist 05/01/2023 9:26 AM

## 2023-05-01 NOTE — Progress Notes (Signed)
Patient with persistent elevated potassium.  Most recent to 7.2.  EKG no peaked T waves.  1 g calcium gluconate, 10 IV insulin and an amp of D50, 45 g Kayexalate, amp of bicarb, albuterol nebulizer, IV Lasix given.  Patient on Lokelma 3 times daily and has taken it when given.  Discussed with patient hemodialysis if potassium does not improve.  For now patient states he would like to wait to think on that on.  Repeated potassium ordered for 2 hours.

## 2023-05-01 NOTE — Progress Notes (Signed)
PROGRESS NOTE    Fernando Young  UUV:[id number] DOB: Jul 19, 1940 DOA: 04/26/2023 PCP: [name], MD   Brief Narrative: admitted 12/25 by pccm Trh pick up 8/91 82 year old male with history of CLL, hypertension, iron deficiency anemia, diarrhea, paroxysmal atrial fibrillation, combined diastolic systolic heart failure, hypothyroidism, failure to thrive, malnutrition, recent admission for septic shock readmitted with multilobar pneumonia on 04/26/2023.  He presented with complaints of fever chills rigors fatigue and shortness of breath and cough.  X-ray showed multifocal pneumonia.  CT of the abdomen and pelvis showed inflammatory changes surrounding the distal ileum and persistent foreign body for which general surgery was consulted recommended conservative management.  Patient was initially started on fluids without improvement in hypotension he was started on normal epinephrine drip with improvement in his blood pressure.  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  Assessment & Plan:   Principal Problem:   Septic shock (HCC) Active Problems:   FTT (failure to thrive) in adult   Atrial fibrillation (HCC)   #1 septic shock secondary to multilobar pneumonia on Zosyn and Zyvox.  Patient is on room air and is able to maintain his oxygen saturation.  Very frail and chronically ill-appearing.  Plan is for him to be discharged home with his daughter with home health.  If he continues to deteriorate then they will think of transitioning him to [facility].  Continue nebs out of bed as tolerated. Appreciate  palliative care input.  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  Potassium is trending down to 6.1 from more than 7.5.  Unclear why his potassium shot up so high.  Continue renal diet.  If potassium remains stable plan is still discharge home tomorrow with his daughter and home health.   #9 hyperlipidemia on Lipitor   Estimated body mass index is 21.2 kg/m as calculated from the following:   Height as of this encounter: 6' (1.829 m).   Weight as of this encounter: 70.9 kg.  DVT prophylaxis: eliquis Code Status: dnr Family Communication: Discussed with daughter Disposition Plan:  Status is: Inpatient Remains inpatient appropriate because: acute illness   Consultants: pccm  Procedures:none Antimicrobials:zosyn zyvox  Subjective:   Looks worn out has been getting multiple blood draws due to high potassium  Objective: Vitals:   04/30/23 2012 05/01/23 0446 05/01/23 0450 05/01/23 0756  BP: 105/73 100/60  91/62  Pulse: 68 70  79  Resp: 18 18    Temp: 97.9 F (36.6 C) 98 F (36.7 C)  97.6 F (36.4 C)  TempSrc: Oral Oral  Oral  SpO2: 98% 97%  97%  Weight:   70.9 kg   Height:        Intake/Output Summary (Last 24 hours) at 05/01/2023 1211 Last data filed at 05/01/2023 0946 Gross per 24 hour  Intake 1116.88 ml  Output 2200 ml  Net -1083.12 ml    Filed Weights   04/29/23 0558 04/30/23 0500 05/01/23 0450  Weight: 65.5 kg 67.5 kg 70.9 kg    Examination:  General exam: Appears in nad   Respiratory system: Coarse  to auscultation. Respiratory effort normal. Cardiovascular system: S1 & S2 heard, RRR. No JVD, murmurs, rubs, gallops or clicks. No pedal edema. Gastrointestinal system: Abdomen is nondistended, soft and nontender. No organomegaly or masses felt. Normal bowel sounds heard. Central nervous system: Alert and oriented. No focal neurological deficits. Extremities: no edema  Data  Reviewed: I have personally reviewed following labs and imaging studies  CBC: Recent Labs  Lab 04/26/23 0730 04/26/23 1020 04/27/23 0418 04/29/23 0520 04/30/23 1037  WBC 176.0*  --  115.5* 136.0* 206.5*  NEUTROABS 3.5  --   --   --   --   HGB 9.7* 9.2* 7.7* 8.4* 10.7*  HCT 33.4* 27.0* 25.9* 28.6* 37.2*  MCV 97.7  --  95.9 96.6 97.1  PLT 274  --  193 209 294   Basic Metabolic Panel: Recent Labs  Lab 04/27/23 0418 04/27/23 1936 04/28/23 0710 04/29/23 1408 04/30/23 1144 04/30/23 1607 04/30/23 2004 05/01/23 0038 05/01/23 0423 05/01/23 0708  NA 138  --  134* 133* 128* 129*  --  131* 132*  --   K 4.4  --  5.9* 5.3* >7.5* 6.5* 6.7* 7.2* 6.8* 6.1*  CL 104  --  101 98 100 99  --  99 101  --   CO2 28  --  25 27 23 24   --  24 21*  --   GLUCOSE 89  --  83 150* 119* 163*  --  84 86  --   BUN 22  --  14 9 11 11   --  11 12  --   CREATININE 0.96  --  0.86 0.85 1.02 0.90  --  1.04 1.17  --   CALCIUM 7.8*  --  7.7* 8.0* 8.0* 8.0*  --  8.4* 8.3*  --   MG 1.1* 2.2 2.1  --   --   --   --   --   --   --   PHOS 3.6  --   --   --   --   --   --   --   --   --    GFR: Estimated Creatinine Clearance: 47.1 mL/min (by C-G formula based on SCr of 1.17 mg/dL). Liver Function Tests: Recent Labs  Lab 04/26/23 0730 04/29/23 1408 04/30/23 1144  AST 39 25 31  ALT 15 20 18   ALKPHOS 64 55 56  BILITOT 1.2* 0.5 0.6  PROT 5.7* 5.4* 5.1*  ALBUMIN 3.0* 2.5* 2.5*   No results for input(s): "LIPASE", "AMYLASE" in the last 168 hours. No results for input(s): "AMMONIA" in the last 168 hours. Coagulation Profile: Recent Labs  Lab 04/26/23 0730  INR 1.8*   Cardiac Enzymes: No results for input(s): "CKTOTAL", "CKMB", "CKMBINDEX", "TROPONINI" in the last 168 hours. BNP (last 3 results) No results for input(s): "PROBNP" in the last 8760 hours. HbA1C: No results for input(s): "HGBA1C" in the last 72 hours. CBG: Recent Labs  Lab 04/30/23 2018 04/30/23 2225 05/01/23 0021 05/01/23 0448  05/01/23 0814  GLUCAP 147* 177* 84 86 131*   Lipid Profile: No results for input(s): "CHOL", "HDL", "LDLCALC", "TRIG", "CHOLHDL", "LDLDIRECT" in the last 72 hours. Thyroid Function Tests: No results for input(s): "TSH", "T4TOTAL", "FREET4", "T3FREE", "THYROIDAB" in the last 72 hours. Anemia Panel: No results for input(s): "VITAMINB12", "FOLATE", "FERRITIN", "TIBC", "IRON", "RETICCTPCT" in the last 72 hours. Sepsis Labs: Recent Labs  Lab 04/26/23 0742 04/26/23 1016 05/01/23 0940  LATICACIDVEN 1.1 0.7 1.1    Recent Results (from the past 240  hours)  Resp panel by RT-PCR (RSV, Flu A&B, Covid) Anterior Nasal Swab     Status: None   Collection Time: 04/26/23  7:18 AM   Specimen: Anterior Nasal Swab  Result Value Ref Range Status   SARS Coronavirus 2 by RT PCR NEGATIVE NEGATIVE Final   Influenza A by PCR NEGATIVE NEGATIVE Final   Influenza B by PCR NEGATIVE NEGATIVE Final    Comment: (NOTE) The Xpert Xpress SARS-CoV-2/FLU/RSV plus assay is intended as an aid in the diagnosis of influenza from Nasopharyngeal swab specimens and should not be used as a sole basis for treatment. Nasal washings and aspirates are unacceptable for Xpert Xpress SARS-CoV-2/FLU/RSV testing.  Fact Sheet for Patients: BloggerCourse.com  Fact Sheet for Healthcare Providers: SeriousBroker.it  This test is not yet approved or cleared by the [place] FDA and has been authorized for detection and/or diagnosis of SARS-CoV-2 by FDA under an Emergency Use Authorization (EUA). This EUA will remain in effect (meaning this test can be used) for the duration of the COVID-19 declaration under Section 564(b)(1) of the Act, 21 U.S.C. section 360bbb-3(b)(1), unless the authorization is terminated or revoked.     Resp Syncytial Virus by PCR NEGATIVE NEGATIVE Final    Comment: (NOTE) Fact Sheet for Patients: BloggerCourse.com  Fact Sheet  for Healthcare Providers: SeriousBroker.it  This test is not yet approved or cleared by the [place] FDA and has been authorized for detection and/or diagnosis of SARS-CoV-2 by FDA under an Emergency Use Authorization (EUA). This EUA will remain in effect (meaning this test can be used) for the duration of the COVID-19 declaration under Section 564(b)(1) of the Act, 21 U.S.C. section 360bbb-3(b)(1), unless the authorization is terminated or revoked.  Performed at [facility], [address], [place], [place] [address]   Blood Culture (routine x 2)     Status: Abnormal   Collection Time: 04/26/23  7:30 AM   Specimen: BLOOD  Result Value Ref Range Status   Specimen Description BLOOD PORTA CATH  Final   Special Requests   Final    BOTTLES DRAWN AEROBIC AND ANAEROBIC Blood Culture results may not be optimal due to an inadequate volume of blood received in culture bottles   Culture  Setup Time   Final    GRAM POSITIVE COCCI IN BOTH AEROBIC AND ANAEROBIC BOTTLES CRITICAL RESULT CALLED TO, READ BACK BY AND VERIFIED WITH: PHARMD J FRANS [id number] AT 958 AM BY CM    Culture (A)  Final    STAPHYLOCOCCUS EPIDERMIDIS THE SIGNIFICANCE OF ISOLATING THIS ORGANISM FROM A SINGLE SET OF BLOOD CULTURES WHEN MULTIPLE SETS ARE DRAWN IS UNCERTAIN. PLEASE NOTIFY THE MICROBIOLOGY DEPARTMENT WITHIN ONE WEEK IF SPECIATION AND SENSITIVITIES ARE REQUIRED. Performed at [facility], [address], [place], [place] [address]    Report Status 04/29/2023 FINAL  Final  Blood Culture ID Panel (Reflexed)     Status: Abnormal   Collection Time: 04/26/23  7:30 AM  Result Value Ref Range Status   Enterococcus faecalis NOT DETECTED NOT DETECTED Final   Enterococcus Faecium NOT DETECTED NOT DETECTED Final   Listeria monocytogenes NOT DETECTED NOT DETECTED Final   Staphylococcus species DETECTED (A) NOT DETECTED Final    Comment: CRITICAL RESULT CALLED TO, READ BACK BY AND  VERIFIED WITH: PHARMD J FRANS [id number] AT 958 AM BY CM    Staphylococcus aureus (BCID) NOT DETECTED NOT DETECTED Final   Staphylococcus epidermidis DETECTED (A) NOT DETECTED Final    Comment: Methicillin (  oxacillin) resistant coagulase negative staphylococcus. Possible blood culture contaminant (unless isolated from more than one blood culture draw or clinical case suggests pathogenicity). No antibiotic treatment is indicated for blood  culture contaminants. CRITICAL RESULT CALLED TO, READ BACK BY AND VERIFIED WITH: PHARMD J FRANS [id number] AT 958 AM BY CM    Staphylococcus lugdunensis NOT DETECTED NOT DETECTED Final   Streptococcus species NOT DETECTED NOT DETECTED Final   Streptococcus agalactiae NOT DETECTED NOT DETECTED Final   Streptococcus pneumoniae NOT DETECTED NOT DETECTED Final   Streptococcus pyogenes NOT DETECTED NOT DETECTED Final   A.calcoaceticus-baumannii NOT DETECTED NOT DETECTED Final   Bacteroides fragilis NOT DETECTED NOT DETECTED Final   Enterobacterales NOT DETECTED NOT DETECTED Final   Enterobacter cloacae complex NOT DETECTED NOT DETECTED Final   Escherichia coli NOT DETECTED NOT DETECTED Final   Klebsiella aerogenes NOT DETECTED NOT DETECTED Final   Klebsiella oxytoca NOT DETECTED NOT DETECTED Final   Klebsiella pneumoniae NOT DETECTED NOT DETECTED Final   Proteus species NOT DETECTED NOT DETECTED Final   Salmonella species NOT DETECTED NOT DETECTED Final   Serratia marcescens NOT DETECTED NOT DETECTED Final   Haemophilus influenzae NOT DETECTED NOT DETECTED Final   Neisseria meningitidis NOT DETECTED NOT DETECTED Final   Pseudomonas aeruginosa NOT DETECTED NOT DETECTED Final   Stenotrophomonas maltophilia NOT DETECTED NOT DETECTED Final   Candida albicans NOT DETECTED NOT DETECTED Final   Candida auris NOT DETECTED NOT DETECTED Final   Candida glabrata NOT DETECTED NOT DETECTED Final   Candida krusei NOT DETECTED NOT DETECTED Final   Candida parapsilosis NOT  DETECTED NOT DETECTED Final   Candida tropicalis NOT DETECTED NOT DETECTED Final   Cryptococcus neoformans/gattii NOT DETECTED NOT DETECTED Final   Methicillin resistance mecA/C DETECTED (A) NOT DETECTED Final    Comment: CRITICAL RESULT CALLED TO, READ BACK BY AND VERIFIED WITH: PHARMD J FRANS [id number] AT 958 AM BY CM Performed at [facility], [address], [place], [place] [address]   Blood Culture (routine x 2)     Status: None (Preliminary result)   Collection Time: 04/26/23  7:52 AM   Specimen: BLOOD  Result Value Ref Range Status   Specimen Description BLOOD LEFT ANTECUBITAL  Final   Special Requests   Final    BOTTLES DRAWN AEROBIC AND ANAEROBIC Blood Culture results may not be optimal due to an inadequate volume of blood received in culture bottles   Culture   Final    NO GROWTH 4 DAYS Performed at [facility], [address] [place], [place] [address]    Report Status PENDING  Incomplete  MRSA Next Gen by PCR, Nasal     Status: Abnormal   Collection Time: 04/26/23 12:04 PM   Specimen: Nasal Mucosa; Nasal Swab  Result Value Ref Range Status   MRSA by PCR Next Gen DETECTED (A) NOT DETECTED Final    Comment: (NOTE) The GeneXpert MRSA Assay (FDA approved for NASAL specimens only), is one component of a comprehensive MRSA colonization surveillance program. It is not intended to diagnose MRSA infection nor to guide or monitor treatment for MRSA infections. Test performance is not FDA approved in patients less than 36 years old. Performed at [facility], [address], [place], [place] [address]   Urine Culture     Status: Abnormal   Collection Time: 04/27/23  9:12 AM   Specimen: Urine, Random  Result Value Ref Range Status   Specimen Description URINE, RANDOM  Final   Special Requests   Final    NONE Performed at [facility], [address], [place], [place] [address]    Culture MULTIPLE SPECIES PRESENT, SUGGEST RECOLLECTION (A)  Final    Report Status 04/28/2023 FINAL  Final      Radiology Studies: No results found.   Scheduled Meds:  amiodarone  200 mg Oral Daily   apixaban  2.5 mg Oral BID   atorvastatin  20 mg Oral Daily   Chlorhexidine Gluconate Cloth  6 each Topical Q0600   cholestyramine light  4 g Oral BID   insulin aspart  0-6 Units Subcutaneous Q4H   levothyroxine  75 mcg Oral Q0600   linezolid  600 mg Oral Q12H   midodrine  5 mg Oral Q8H   mupirocin ointment  1 Application Nasal BID   rOPINIRole  1 mg Oral QHS   sodium chloride flush  3-10 mL Intravenous Q12H   sodium zirconium cyclosilicate  10 g Oral TID   Continuous Infusions:     LOS: 5 days    Time spent: 38 min  Alwyn Ren, MD 05/01/2023, 12:11 PM

## 2023-05-01 NOTE — Progress Notes (Signed)
Palliative Medicine Inpatient Follow Up Note HPI: 82 year old male with history of CLL, hypertension, iron deficiency anemia, diarrhea, paroxysmal atrial fibrillation, combined diastolic systolic heart failure, hypothyroidism, failure to thrive, malnutrition, recent admission for septic shock readmitted with multilobar pneumonia.    Palliative care asked to get involved to discuss goals of care in the setting of acute illness.  Today's Discussion 05/01/2023  *Please note that this is a verbal dictation therefore any spelling or grammatical errors are due to the "Dragon Medical One" system interpretation.  Chart reviewed inclusive of vital signs, progress notes, laboratory results, and diagnostic images. High K overnight.    I met with Fernando Young at bedside this morning. He is in good spirits. I discussed with him the concern(s) in the setting of his elevated potassium level inclusive of a cardiac arrest. Alani shares that someone came by early this morning but he did not understand what they were trying to tell him. We talked about the mention of dialysis. I shared what this is and if this would ever be a measure he would want to pursue. Fernando Young shares that he "is too old for all that." He confirms that he would not dialysis treatment(s). Created space and opportunity for patient to explore thoughts feelings and fears regarding current medical situation. He understands that his body has been declining and he shares awareness that he will not live forever.  I again reviewed with Fernando Young the plan to transition to home with [facility] and Palliative support. We discussed that is he is not thriving the transition to home hospice.   Reviewed patients code status and the desire for full DNAR/DNI.   I called patients daughter, Fernando Young and reviewed the above. She is understanding of her fathers wishes and wants to respect them.  Questions and concerns addressed/Palliative Support Provided.   Objective  Assessment: Vital Signs Vitals:   05/01/23 0446 05/01/23 0756  BP: 100/60 91/62  Pulse: 70 79  Resp: 18   Temp: 98 F (36.7 C) 97.6 F (36.4 C)  SpO2: 97% 97%    Intake/Output Summary (Last 24 hours) at 05/01/2023 1221 Last data filed at 05/01/2023 9562 Gross per 24 hour  Intake 1116.88 ml  Output 2200 ml  Net -1083.12 ml   Last Weight  Most recent update: 05/01/2023  4:50 AM    Weight  70.9 kg (156 lb 4.9 oz)            Gen: Elderly Caucasian male chronically ill-appearing HEENT: moist mucous membranes CV: Regular rate and irregular rhythm PULM: On room air breathing is even and nonlabored ABD: soft/nontender EXT: Decreased muscular tone in all 4 extremities , (-) edema Neuro: Alert and oriented x3 hard of hearing  SUMMARY OF RECOMMENDATIONS   DNAR/DNI   Patient would not desire hemodialysis --> Discussed patients hyperkalemia and how it could result in cardiac arrest   Plan for outpatient home health though if patient does not thrive transitioning him to [facility] hospice services --> Fernando Young has called and informed them   Ongoing incremental palliative care support  Billing based on MDM: High ______________________________________________________________________________________ Lamarr Lulas [place] Palliative Medicine Team Team Cell Phone: [phone] Please utilize secure chat with additional questions, if there is no response within 30 minutes please call the above phone number  Palliative Medicine Team providers are available by phone from 7am to 7pm daily and can be reached through the team cell phone.  Should this patient require assistance outside of these hours, please call the  patient's attending physician.

## 2023-05-01 NOTE — Plan of Care (Signed)

## 2023-05-02 DIAGNOSIS — R6521 Severe sepsis with septic shock: Secondary | ICD-10-CM | POA: Diagnosis not present

## 2023-05-02 DIAGNOSIS — A419 Sepsis, unspecified organism: Secondary | ICD-10-CM | POA: Diagnosis not present

## 2023-05-02 LAB — POTASSIUM
Potassium: 5.7 mmol/L — ABNORMAL HIGH (ref 3.5–5.1)
Potassium: 6.9 mmol/L (ref 3.5–5.1)
Potassium: 5.6 mmol/L — ABNORMAL HIGH (ref 3.5–5.1)
Potassium: 5.9 mmol/L — ABNORMAL HIGH (ref 3.5–5.1)

## 2023-05-02 LAB — GLUCOSE, CAPILLARY
Glucose-Capillary: 101 mg/dL — ABNORMAL HIGH (ref 70–99)
Glucose-Capillary: 98 mg/dL (ref 70–99)
Glucose-Capillary: 103 mg/dL — ABNORMAL HIGH (ref 70–99)

## 2023-05-02 MED ORDER — SODIUM POLYSTYRENE SULFONATE PO POWD: Freq: Two times a day (BID) | ORAL | 0 refills | Status: DC

## 2023-05-02 MED ORDER — CHLORHEXIDINE GLUCONATE CLOTH 2 % EX PADS
6.0000 | MEDICATED_PAD | Freq: Every day | CUTANEOUS | Status: DC
  Administered 2023-05-02: 6 via TOPICAL

## 2023-05-02 MED ORDER — POLYETHYLENE GLYCOL 3350 17 G PO PACK: 17.0000 g | PACK | Freq: Every day | ORAL | 0 refills | Status: DC | PRN

## 2023-05-02 MED ORDER — APIXABAN 5 MG PO TABS
5.0000 mg | ORAL_TABLET | Freq: Two times a day (BID) | ORAL | Status: DC
  Administered 2023-05-02: 5 mg via ORAL
  Filled 2023-05-02: qty 1

## 2023-05-02 MED ORDER — MIDODRINE HCL 10 MG PO TABS: 10.0000 mg | ORAL_TABLET | Freq: Three times a day (TID) | ORAL | 2 refills | Status: DC

## 2023-05-02 MED ORDER — HEPARIN SOD (PORK) LOCK FLUSH 100 UNIT/ML IV SOLN
500.0000 [IU] | INTRAVENOUS | Status: AC | PRN
  Administered 2023-05-02: 500 [IU]

## 2023-05-02 MED ORDER — SODIUM POLYSTYRENE SULFONATE 15 GM/60ML CO SUSP
45.0000 g | Freq: Once | Status: AC
  Administered 2023-05-02: 45 g via RECTAL
  Filled 2023-05-02: qty 180

## 2023-05-02 NOTE — TOC Transition Note (Signed)
 Transition of Care [facility]) - Discharge Note   Patient Details  Name: Fernando Young MRN: [id number] Date of Birth: [birth year]  Transition [facility] Care [facility]) CM/SW Contact:  Rosaline JONELLE Joe, RN Phone Number: 05/02/2023, 3:19 PM   Clinical Narrative:    CM called and spoke with the patient's daughter, Zebedee, by phone and the patient will be discharged back to the patient's daughter's home today by [facility] transport.  Bedside nursing will arrange for IV Team to de-access the patient's port-a-cath prior to discharge.  PTAR was called and set up as will call.  Bedside nursing to call PTAR back when he is ready to transport to home - PTAR # [phone].  DNR is signed and will be transported home with the patient.         Patient Goals and CMS Choice            Discharge Placement                       Discharge Plan and Services Additional resources added to the After Visit Summary for                                       Social Drivers of Health (SDOH) Interventions SDOH Screenings   Food Insecurity: No Food Insecurity (04/29/2023)  Housing: Unknown (04/29/2023)  Transportation Needs: No Transportation Needs (04/29/2023)  Utilities: Not At Risk (04/29/2023)  Financial Resource Strain: Low Risk  (04/18/2022)   Received from [facility], [facility]  Physical Activity: Sufficiently Active (04/18/2022)   Received from [facility], [facility]  Social Connections: Socially Isolated (05/01/2023)  Stress: No Stress Concern Present (04/18/2022)   Received from [facility], [facility]  Tobacco Use: Medium Risk (04/26/2023)  Health Literacy: Medium Risk (04/18/2022)   Received from [facility], [facility]     Readmission Risk Interventions    05/02/2023    3:19 PM 04/28/2023   11:47 AM 04/06/2023    3:00 PM  Readmission Risk Prevention Plan  Transportation Screening Complete Complete Complete  PCP or Specialist  Appt within 5-7 Days   Complete  PCP or Specialist Appt within 3-5 Days Complete Complete   Home Care Screening   Complete  Medication Review (RN CM)   Complete  HRI or Home Care Consult Complete Complete   Social Work Consult for Recovery Care Planning/Counseling Complete Complete   Palliative Care Screening Complete Complete   Medication Review Oceanographer) Complete Complete

## 2023-05-02 NOTE — Plan of Care (Signed)
Patient discharged off unit. AVS reviewed with patient, verbalized understanding. All questions and concerns answered. Patient taken off unit via stretcher.

## 2023-05-02 NOTE — Progress Notes (Signed)
 Physical Therapy Treatment Patient Details Name: Fernando Young MRN: [id number] DOB: 10-31-1940 Today's Date: 05/02/2023   History of Present Illness 82 yo man admitted 12/25 with here with dyspnea, fevers/rigors, and worsening diarrhea, abdominal pain. Found to be hypotensive.  Pt admitted with septic shock due to PNE.  Pt with palliative consult - noting plan to return home with palliative care and HH, with idea that if not thriving could transition to hospice.   PMHx: CLL, chronic diarrhea, atrial fibrillation, heart failure, severe protein calorie malnutrition and repeated hospitalizations over the past few weeks.    PT Comments  Pt requiring education and encouragement for therapy.  He was worried about getting in chair b/c RN informed him he would be having blood draws - discussed they could do in chair, and because incontinent of BM.  Discussed he did well with standing and cleaning if nursing unable to get to him in time.  Pt requiring increased time during session for ADLs due to loose BM.s.  Mod A for bed mobility but min A to stand and transfer to chair.  Will continue plan of care. Noting plan to return home with [facility] and palliative care - pt reports daughter able to provide physical assist.      If plan is discharge home, recommend the following: A little help with walking and/or transfers;A little help with bathing/dressing/bathroom;Help with stairs or ramp for entrance;Assist for transportation;Assistance with cooking/housework   Can travel by private vehicle        Equipment Recommendations  None recommended by PT    Recommendations for Other Services       Precautions / Restrictions Precautions Precautions: Fall     Mobility  Bed Mobility Overal bed mobility: Needs Assistance Bed Mobility: Rolling, Supine to Sit Rolling: Min assist   Supine to sit: Mod assist     General bed mobility comments: Rolling both directions for cleaning with min A.  Transition to EOB with  mod A for trunk and then scooting forward with cues to push with hand and therapist providing support at back.    Transfers Overall transfer level: Needs assistance Equipment used: Rolling walker (2 wheels) Transfers: Sit to/from Stand, Bed to chair/wheelchair/BSC Sit to Stand: Min assist, From elevated surface   Step pivot transfers: Min assist       General transfer comment: Stood from elevated bed x 2 with min A and cues for RW.  Min A with step pivot to chair. Did place 2 pillows in recliner to elevate and cushion    Ambulation/Gait Ambulation/Gait assistance: Min assist Gait Distance (Feet): 2 Feet Assistive device: Rolling walker (2 wheels) Gait Pattern/deviations: Step-to pattern, Decreased stride length, Shuffle Gait velocity: decreased     General Gait Details: Small steps to chair with assist, fatigued easily   Stairs             Wheelchair Mobility     Tilt Bed    Modified Rankin (Stroke Patients Only)       Balance Overall balance assessment: Needs assistance Sitting-balance support: No upper extremity supported, Feet supported Sitting balance-Leahy Scale: Fair     Standing balance support: Bilateral upper extremity supported, During functional activity, Reliant on assistive device for balance Standing balance-Leahy Scale: Poor Standing balance comment: Steady with RW. Stood without assist (static) for 2 mins for ADLs (had another BM, therapist assisted cleaning)  Cognition Arousal: Alert Behavior During Therapy: Flat affect Overall Cognitive Status: Within Functional Limits for tasks assessed                                 General Comments: Pt becomes frustrated quickly with mobility and frequent BMs.  Needs encouragement and cues.        Exercises      General Comments        Pertinent Vitals/Pain Pain Assessment Pain Assessment: Faces Faces Pain Scale: Hurts a little  bit Pain Location: Buttock (raw, nursing aware) Pain Descriptors / Indicators: Grimacing Pain Intervention(s): Limited activity within patient's tolerance, Monitored during session, Repositioned    Home Living                          Prior Function            PT Goals (current goals can now be found in the care plan section) Progress towards PT goals: Progressing toward goals    Frequency    Min 1X/week      PT Plan      Co-evaluation              AM-PAC PT 6 Clicks Mobility   Outcome Measure  Help needed turning from your back to your side while in a flat bed without using bedrails?: A Little Help needed moving from lying on your back to sitting on the side of a flat bed without using bedrails?: A Little Help needed moving to and from a bed to a chair (including a wheelchair)?: A Little Help needed standing up from a chair using your arms (e.g., wheelchair or bedside chair)?: A Little Help needed to walk in hospital room?: Total Help needed climbing 3-5 steps with a railing? : Total 6 Click Score: 14    End of Session Equipment Utilized During Treatment: Gait belt Activity Tolerance: Patient tolerated treatment well;Other (comment) (Some limitations due to frequent BM requiring cleaning x 2 during session) Patient left: with call bell/phone within reach;with chair alarm set;in chair Nurse Communication: Mobility status PT Visit Diagnosis: Unsteadiness on feet (R26.81);Difficulty in walking, not elsewhere classified (R26.2);Muscle weakness (generalized) (M62.81)     Time: 8775-8696 PT Time Calculation (min) (ACUTE ONLY): 39 min  Charges:    $Therapeutic Activity: 38-52 mins PT General Charges $$ ACUTE PT VISIT: 1 Visit                     Fernando, PT Acute Rehab [facility] [phone]    Fernando Young 05/02/2023, 1:39 PM

## 2023-05-02 NOTE — Discharge Instructions (Signed)

## 2023-05-02 NOTE — Discharge Summary (Signed)
 Physician Discharge Summary  Fernando Young FMW:[id number] DOB: October 02, 1940 DOA: 04/26/2023  PCP: Rosan Mix, MD  Admit date: 04/26/2023 Discharge date: 05/02/2023  Admitted From: Home Disposition: Home with daughter  Recommendations for Outpatient Follow-up:  Follow up with PCP in 1-2 weeks Please obtain BMP/CBC in one week Please get BMP done in 1 week as potassium level was very high during the hospital stay  Home Health: Yes Equipment/Devices: None  Discharge Condition: Stable CODE STATUS: DNR  Diet recommendation: Renal low potassium diet   brief/Interim Summary: admitted 12/25 by pccm [facility] pick up 50/36 82 year old male with history of CLL, hypertension, iron deficiency anemia, diarrhea, paroxysmal atrial fibrillation, combined diastolic systolic heart failure, hypothyroidism, failure to thrive, malnutrition, recent admission for septic shock readmitted with multilobar pneumonia on 04/26/2023.  He presented with complaints of fever chills rigors fatigue and shortness of breath and cough.  X-ray showed multifocal pneumonia.  CT of the abdomen and pelvis showed inflammatory changes surrounding the distal ileum and persistent foreign body for which general surgery was consulted recommended conservative management.  Patient was initially started on fluids without improvement in hypotension he was started on normal epinephrine drip with improvement in his blood pressure.  He has chronic hypotension and is on midodrine  at home.  Discharge Diagnoses:  Principal Problem:   Septic shock (HCC) Active Problems:   FTT (failure to thrive) in adult   Atrial fibrillation (HCC)       #1 septic shock secondary to multilobar pneumonia -he was treated with Zosyn  and Zyvox .  He was able to maintain saturation on room air.  Plan is to discharge him home with hospice and [facility] transition to palliative if he does not do well at home.  He was ordered by palliative care during the  hospital stay.  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  His potassium was 5.9 on discharge.  He will be discharged on Kayexalate .  Daughter is aware that this is very dangerous and patient could pass away with this situation.  Encouraged her to give him only renal diet.  She is thinking of possible palliative to hospice care since he has been noted to deteriorating rapidly He was not sent home on Lokelma  as it was very expensive.  #9 hyperlipidemia on Lipitor   Estimated body mass index is 19.46 kg/m as calculated from the following:   Height as of this encounter: 6' (1.829 m).   Weight as of this encounter: 65.1 kg.  Discharge Instructions  Discharge Instructions     Diet - low sodium heart healthy   Complete by: As directed    Increase activity slowly   Complete by: As directed    No wound care   Complete by: As directed       Allergies as of 05/02/2023   No Known Allergies      Medication List     STOP taking these medications    doxycycline  100 MG tablet Commonly known as: VIBRA -TABS   furosemide  40 MG tablet Commonly known as: LASIX    Vitamin C 500 MG Caps  TAKE these medications    acetaminophen  500 MG tablet Commonly known as: TYLENOL  Take 1,000 mg by mouth every 6 (six) hours as needed for mild pain (pain score 1-3) or fever.   amiodarone  200 MG tablet Commonly known as: PACERONE  Take 200 mg by mouth daily.   atorvastatin  20 MG tablet Commonly known as: LIPITOR Take 20 mg by mouth at bedtime.   Benefiber Powd Take 4 g by mouth See  admin instructions. Mix 4 grams (1 teaspoonful) into a cup of coffee and drink every morning   bismuth  subsalicylate 262 MG/15ML suspension Commonly known as: PEPTO BISMOL Take 30 mLs by mouth every 6 (six) hours as needed for diarrhea or loose stools or indigestion.   cholecalciferol 25 MCG (1000 UNIT) tablet Commonly known as: VITAMIN D3 Take 1,000 Units by mouth daily.   cholestyramine  4 g packet Commonly known as: QUESTRAN  Take 4 g by mouth daily.   diphenoxylate -atropine  2.5-0.025 MG tablet Commonly known as: LOMOTIL  Take 1 tablet by mouth See admin instructions. Take 2 tablets by mouth in the morning and an additional 1 tablet up to twice a day as needed for loose stools   Eliquis  5 MG Tabs tablet Generic drug: apixaban  Take 5 mg by mouth 2 (two) times daily.   levothyroxine  75 MCG tablet Commonly known as: SYNTHROID  Take 75 mcg by mouth daily before breakfast.   metFORMIN 500 MG 24 hr tablet Commonly known as: GLUCOPHAGE-XR Take 500 mg by mouth 2 (two) times daily.   midodrine  10 MG tablet Commonly known as: PROAMATINE  Take 1 tablet (10 mg total) by mouth 3 (three) times daily with meals. What changed:  medication strength how much to take   nitroGLYCERIN 0.4 MG SL tablet Commonly known as: NITROSTAT Place 0.4 mg under the tongue every 5 (five) minutes as needed for chest pain.   polyethylene glycol 17 g packet Commonly known as: MIRALAX  / GLYCOLAX  Take 17 g by mouth daily as needed for moderate constipation.   rOPINIRole  1 MG tablet Commonly known as: REQUIP  Take 1 mg by mouth at bedtime.   sodium polystyrene powder Commonly known as: KAYEXALATE  Take by mouth 2 (two) times daily. 30 gram bid        Follow-up Information     [facility] Follow up.   Specialty: Home Health Services Why: [facility] will continue to provide home health services.  They will call you in the next 24-48 hours to re-start services. Contact  information: [address] [place] [address] [phone]                No Known Allergies  Consultations: pccm   Procedures/Studies: CT ABDOMEN PELVIS W CONTRAST Result Date: 04/26/2023 CLINICAL DATA:  82 year old male with history of abdominal pain. Sepsis. History of prostate cancer. * Tracking Code: BO * EXAM: CT ABDOMEN AND PELVIS WITH CONTRAST TECHNIQUE: Multidetector CT imaging of the abdomen and pelvis was performed using the standard protocol following bolus administration of intravenous contrast. RADIATION DOSE REDUCTION: This exam was performed according to the departmental dose-optimization program which includes automated exposure control, adjustment of the mA and/or kV according to patient size and/or use of iterative reconstruction technique. CONTRAST:  75mL OMNIPAQUE  IOHEXOL  350 MG/ML SOLN COMPARISON:  CT of the abdomen and pelvis 04/07/2023. FINDINGS: Lower chest: Extensive bibasilar areas of scarring and/or atelectasis, similar to the prior examination. Atherosclerotic calcifications are noted in the descending thoracic aorta as well as the left anterior descending and right coronary  arteries. Hepatobiliary: No suspicious cystic or solid hepatic lesions. No intra or extrahepatic biliary ductal dilatation. Diffuse periportal edema is noted. Gallbladder is moderately distended. Amorphous intermediate attenuation material lying dependently in the proximal gallbladder. No definite pericholecystic fluid or surrounding inflammatory changes. Pancreas: No pancreatic mass. No pancreatic ductal dilatation. No pancreatic or peripancreatic fluid collections or inflammatory changes. Spleen: Spleen is enlarged measuring 12.8 x 7.2 x 16.8 cm (estimated splenic volume of 774 mL). Adrenals/Urinary Tract: Tiny 2-3 mm nonobstructive calculi in the lower pole collecting system of the left kidney. Subcentimeter low-attenuation lesion in the lower pole of the right kidney, too small to  characterize, but statistically likely a cyst (no imaging follow-up recommended). Bilateral adrenal glands are unremarkable in appearance. No hydroureteronephrosis. Urinary bladder is unremarkable in appearance. Stomach/Bowel: The appearance of the stomach is unremarkable. No pathologic dilatation of small bowel or colon. Large left inguinal scrotal hernia incompletely imaged containing a long segment of the proximal sigmoid colon, similar to the prior study. Again noted is a radiopaque foreign body estimated to measure approximately 4.4 x 0.9 cm in the region of the distal ileum. The surrounding small bowel wall appears thickened and inflamed best appreciated on axial image 51 of series 3. Appendix is not confidently identified, potentially surgically absent. Vascular/Lymphatic: Atherosclerotic calcifications in the abdominal aorta and pelvic vasculature, without evidence of aneurysm or dissection. No definite lymphadenopathy noted in the abdomen or pelvis. Reproductive: Brachytherapy implants in the prostate gland. Other: No significant volume of ascites.  No pneumoperitoneum. Musculoskeletal: There are no aggressive appearing lytic or blastic lesions noted in the visualized portions of the skeleton. IMPRESSION: 1. Elongated radiopaque foreign body in the distal ileum, with extensive surrounding mural thickening and inflammatory changes in the adjacent small bowel. This foreign body is of uncertain etiology, but was evident on the prior examination from 04/07/2023. The mural thickening and inflammatory changes have clearly increased compared to the prior examination. 2. 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 3. Extensive periportal edema in the liver. This is a nonspecific finding, but correlation with liver function tests is recommended. 4. Nonobstructive nephrolithiasis in the lower pole of the left  kidney. 5. Aortic atherosclerosis, in addition to at least 2 vessel coronary artery disease. 6. Additional incidental findings, as above. Electronically Signed   By: Toribio Aye M.D.   On: 04/26/2023 09:25   DG Chest Port 1 View Result Date: 04/26/2023 CLINICAL DATA:  82 year old male with possible sepsis. EXAM: PORTABLE CHEST 1 VIEW COMPARISON:  Chest x-ray 04/02/2023. FINDINGS: Right internal jugular single-lumen Port-A-Cath with tip terminating at the superior cavoatrial junction. Lung volumes are low. Patchy areas of interstitial prominence an ill-defined opacities are scattered throughout the lungs bilaterally, most evident in the lung bases. No pleural effusions. No pneumothorax. No evidence of pulmonary edema. Heart size appears borderline enlarged. Mediastinal contours are distorted by patient positioning. IMPRESSION: 1. The appearance of the lungs is concerning for multilobar bilateral bronchopneumonia. Given the basal predominance of the findings, clinical correlation for signs and symptoms of aspiration pneumonia is suggested. 2. Aortic atherosclerosis. Electronically Signed   By: Toribio Aye M.D.   On: 04/26/2023 07:59   CT ABDOMEN PELVIS WO CONTRAST Result Date: 04/07/2023 CLINICAL DATA:  Abdominal pain EXAM: CT ABDOMEN AND PELVIS WITHOUT CONTRAST TECHNIQUE: Multidetector CT imaging of the abdomen and pelvis was performed following the standard protocol without IV contrast. RADIATION DOSE REDUCTION: This exam was performed  according to the departmental dose-optimization program which includes automated exposure control, adjustment of the mA and/or kV according to patient size and/or use of iterative reconstruction technique. COMPARISON:  None Available. FINDINGS: Lower chest: Small bilateral pleural effusions. Associated lower lobe opacities, left greater than right, atelectasis versus pneumonia. Hepatobiliary: Unenhanced liver is grossly unremarkable, noting benign calcified  granulomata. Gallbladder is unremarkable. No intrahepatic or extrahepatic ductal dilatation. Pancreas: Within normal limits. Spleen: Within normal limits. Adrenals/Urinary Tract: Adrenal glands are within normal limits. 2 mm nonobstructing right lower pole renal calculus (coronal image 57). Three nonobstructing left lower pole renal calculi measuring up to 3 mm (series 2/image 34). No ureteral or bladder calculi. No hydronephrosis. Bladder is within normal limits. Stomach/Bowel: Stomach is within normal limits. Mildly dilated loops small bowel in the central abdomen. Small bowel stasis in the distal/terminal ileum (series 2/image 6) with associated 4.5 cm tubular radiopaque foreign body at the ileocecal valve (series 2/image 62). Colon is not decompressed, arguing against true obstruction. Vascular/Lymphatic: No evidence of abdominal aortic aneurysm. Atherosclerotic calcifications of the abdominal aorta and branch vessels. No suspicious abdominopelvic lymphadenopathy. Reproductive: Brachytherapy seeds along the prostate. Other: No abdominopelvic ascites. Large left inguinal/scrotal hernia containing nondilated loops of left colon. Musculoskeletal: Degenerative changes of the lower lumbar spine. IMPRESSION: Radiopaque foreign body at the ileocecal valve, as described above. Associated small bowel stasis. Colon is not decompressed, favoring adynamic ileus over true small bowel obstruction. Large left inguinal/scrotal hernia containing nondilated loops of left colon. Small bilateral pleural effusions. Associated lower lobe opacities, left greater than right, atelectasis versus pneumonia. Electronically Signed   By: Pinkie Pebbles M.D.   On: 04/07/2023 18:12   ECHOCARDIOGRAM COMPLETE Result Date: 04/03/2023    ECHOCARDIOGRAM REPORT   Patient Name:   JARRED PURTEE Date of Exam: 04/03/2023 Medical Rec #:  [id number]      Height:       70.0 in Accession #:    [id number]     Weight:       149.9 lb Date of Birth:   15-Mar-1941      BSA:          1.847 m Patient Age:    82 years       BP:           114/72 mmHg Patient Gender: M              HR:           81 bpm. Exam Location:  Inpatient Procedure: 2D Echo, Color Doppler, Cardiac Doppler and Intracardiac            Opacification Agent Indications:    Elevated Troponin  History:        Patient has no prior history of Echocardiogram examinations.                 CHF; Risk Factors:Hypertension, Diabetes and Dyslipidemia.  Sonographer:    Tinnie Gosling RDCS Referring Phys: [id number] OMAR M ALBUSTAMI  Sonographer Comments: Technically difficult study due to poor echo windows. IMPRESSIONS  1. Technically difficult study with reduced echo windows  2. No apical thrombus with Definity  enhancing agent. Left ventricular ejection fraction, by estimation, is 35 to 40%. The left ventricle has moderately decreased function. The left ventricle demonstrates regional wall motion abnormalities (see scoring diagram/findings for description). Left ventricular diastolic parameters are consistent with Grade I diastolic dysfunction (impaired relaxation). There is severe hypokinesis of the left ventricular, mid-apical septal wall, apical segment, anteroseptal wall, inferior  wall and anterior wall.  3. Right ventricular systolic function is mildly reduced. The right ventricular size is normal. There is normal pulmonary artery systolic pressure. The estimated right ventricular systolic pressure is 33.8 mmHg.  4. Left atrial size was moderately dilated.  5. The mitral valve is abnormal. Mild mitral valve regurgitation.  6. The tricuspid valve is abnormal.  7. The aortic valve was not well visualized. Aortic valve regurgitation is not visualized.  8. The inferior vena cava is normal in size with <50% respiratory variability, suggesting right atrial pressure of 8 mmHg. Comparison(s): No prior Echocardiogram. FINDINGS  Left Ventricle: No apical thrombus with Definity  enhancing agent. Left ventricular ejection  fraction, by estimation, is 35 to 40%. The left ventricle has moderately decreased function. The left ventricle demonstrates regional wall motion abnormalities. Severe hypokinesis of the left ventricular, mid-apical septal wall, apical segment, anteroseptal wall, inferior wall and anterior wall. Definity  contrast agent was given IV to delineate the left ventricular endocardial borders. The left ventricular internal cavity size was normal in size. There is no left ventricular hypertrophy. Left ventricular diastolic parameters are consistent with Grade I diastolic dysfunction (impaired relaxation). Indeterminate filling pressures.  LV Wall Scoring: The mid and distal anterior septum and entire apex are hypokinetic. Right Ventricle: The right ventricular size is normal. No increase in right ventricular wall thickness. Right ventricular systolic function is mildly reduced. There is normal pulmonary artery systolic pressure. The tricuspid regurgitant velocity is 2.54 m/s, and with an assumed right atrial pressure of 8 mmHg, the estimated right ventricular systolic pressure is 33.8 mmHg. Left Atrium: Left atrial size was moderately dilated. Right Atrium: Right atrial size was normal in size. Pericardium: Trivial pericardial effusion is present. The pericardial effusion is localized near the right ventricle. Mitral Valve: The mitral valve is abnormal. Mild mitral valve regurgitation. Tricuspid Valve: The tricuspid valve is abnormal. Tricuspid valve regurgitation is mild. Aortic Valve: The aortic valve was not well visualized. Aortic valve regurgitation is not visualized. Pulmonic Valve: The pulmonic valve was not well visualized. Pulmonic valve regurgitation is not visualized. Aorta: The aortic root and ascending aorta are structurally normal, with no evidence of dilitation. Venous: The inferior vena cava is normal in size with less than 50% respiratory variability, suggesting right atrial pressure of 8 mmHg. IAS/Shunts:  No atrial level shunt detected by color flow Doppler.  LEFT VENTRICLE PLAX 2D LVIDd:         5.70 cm      Diastology LVIDs:         4.70 cm      LV e' medial:    4.90 cm/s LV PW:         0.80 cm      LV E/e' medial:  9.8 LV IVS:        0.70 cm      LV e' lateral:   8.16 cm/s LVOT diam:     2.20 cm      LV E/e' lateral: 5.9 LV SV:         69 LV SV Index:   37 LVOT Area:     3.80 cm  LV Volumes (MOD) LV vol d, MOD A2C: 113.2 ml LV vol d, MOD A4C: 138.9 ml LV vol s, MOD A2C: 61.9 ml LV vol s, MOD A4C: 88.4 ml LV SV MOD A2C:     51.3 ml LV SV MOD A4C:     138.9 ml LV SV MOD BP:      51.6 ml  RIGHT VENTRICLE            IVC RV S prime:     8.38 cm/s  IVC diam: 1.80 cm TAPSE (M-mode): 2.3 cm LEFT ATRIUM           Index        RIGHT ATRIUM           Index LA diam:      3.90 cm 2.11 cm/m   RA Area:     15.90 cm LA Vol (A2C): 38.8 ml 21.01 ml/m  RA Volume:   42.70 ml  23.12 ml/m LA Vol (A4C): 70.2 ml 38.01 ml/m  AORTIC VALVE LVOT Vmax:   99.20 cm/s LVOT Vmean:  68.300 cm/s LVOT VTI:    0.182 m  AORTA Ao Root diam: 3.20 cm MITRAL VALVE               TRICUSPID VALVE MV Area (PHT): 2.55 cm    TR Peak grad:   25.8 mmHg MV Decel Time: 298 msec    TR Vmax:        254.00 cm/s MV E velocity: 48.00 cm/s MV A velocity: 84.80 cm/s  SHUNTS MV E/A ratio:  0.57        Systemic VTI:  0.18 m                            Systemic Diam: 2.20 cm Vinie Maxcy MD Electronically signed by Vinie Maxcy MD Signature Date/Time: 04/03/2023/11:27:35 AM    Final    DG CHEST PORT 1 VIEW Result Date: 04/02/2023 CLINICAL DATA:  Leukocytosis EXAM: PORTABLE CHEST 1 VIEW COMPARISON:  02/20/2022 FINDINGS: Right central venous catheter with tip over the low SVC region. No pneumothorax. Cardiac enlargement. Probable small bilateral pleural effusions with infiltration or atelectasis in the lung bases. This is similar to prior study. Calcification of the aorta. Mediastinal contours appear intact. IMPRESSION: Cardiac enlargement. Small pleural effusions  with basilar infiltration or atelectasis, similar to prior study. Electronically Signed   By: Elsie Gravely M.D.   On: 04/02/2023 20:51   (Echo, Carotid, EGD, Colonoscopy, ERCP)    Subjective: Frail elderly male looks exhausted from diarrhea overnight due to all the interventions he had for hyperkalemia  Discharge Exam: Vitals:   05/02/23 0343 05/02/23 0754  BP: 103/76 111/71  Pulse: 80 74  Resp: 17 17  Temp: 97.9 F (36.6 C) 98.3 F (36.8 C)  SpO2: 96% 96%   Vitals:   05/01/23 2300 05/02/23 0343 05/02/23 0500 05/02/23 0754  BP: 109/62 103/76  111/71  Pulse: 69 80  74  Resp: 17 17  17   Temp: 98 F (36.7 C) 97.9 F (36.6 C)  98.3 F (36.8 C)  TempSrc: Oral Oral  Oral  SpO2: 99% 96%  96%  Weight:   65.1 kg   Height:        General: Pt is alert, awake, not in acute distress Cardiovascular: RRR, S1/S2 +, no rubs, no gallops Respiratory: Rhonchi bilaterally, Abdominal: Soft, NT, ND, bowel sounds + Extremities: no edema, no cyanosis    The results of significant diagnostics from this hospitalization (including imaging, microbiology, ancillary and laboratory) are listed below for reference.     Microbiology: Recent Results (from the past 240 hours)  Resp panel by RT-PCR (RSV, Flu A&B, Covid) Anterior Nasal Swab     Status: None   Collection Time: 04/26/23  7:18 AM   Specimen: Anterior Nasal Swab  Result Value Ref Range Status   SARS Coronavirus 2 by RT PCR NEGATIVE NEGATIVE Final   Influenza A by PCR NEGATIVE NEGATIVE Final   Influenza B by PCR NEGATIVE NEGATIVE Final    Comment: (NOTE) The Xpert Xpress SARS-CoV-2/FLU/RSV plus assay is intended as an aid in the diagnosis of influenza from Nasopharyngeal swab specimens and should not be used as a sole basis for treatment. Nasal washings and aspirates are unacceptable for Xpert Xpress SARS-CoV-2/FLU/RSV testing.  Fact Sheet for Patients: bloggercourse.com  Fact Sheet for Healthcare  Providers: seriousbroker.it  This test is not yet approved or cleared by the [place]  FDA and has been authorized for detection and/or diagnosis of SARS-CoV-2 by FDA under an Emergency Use Authorization (EUA). This EUA will remain in effect (meaning this test can be used) for the duration of the COVID-19 declaration under Section 564(b)(1) of the Act, 21 U.S.C. section 360bbb-3(b)(1), unless the authorization is terminated or revoked.     Resp Syncytial Virus by PCR NEGATIVE NEGATIVE Final    Comment: (NOTE) Fact Sheet for Patients: bloggercourse.com  Fact Sheet for Healthcare Providers: seriousbroker.it  This test is not yet approved or cleared by the [place]  FDA and has been authorized for detection and/or diagnosis of SARS-CoV-2 by FDA under an Emergency Use Authorization (EUA). This EUA will remain in effect (meaning this test can be used) for the duration of the COVID-19 declaration under Section 564(b)(1) of the Act, 21 U.S.C. section 360bbb-3(b)(1), unless the authorization is terminated or revoked.  Performed at [facility], [address], [place], [place] [address]   Blood Culture (routine x 2)     Status: Abnormal   Collection Time: 04/26/23  7:30 AM   Specimen: BLOOD  Result Value Ref Range Status   Specimen Description BLOOD PORTA CATH  Final   Special Requests   Final    BOTTLES DRAWN AEROBIC AND ANAEROBIC Blood Culture results may not be optimal due to an inadequate volume of blood received in culture bottles   Culture  Setup Time   Final    GRAM POSITIVE COCCI IN BOTH AEROBIC AND ANAEROBIC BOTTLES CRITICAL RESULT CALLED TO, READ BACK BY AND VERIFIED WITH: PHARMD J FRANS [id number] AT 958 AM BY CM    Culture (A)  Final    STAPHYLOCOCCUS EPIDERMIDIS THE SIGNIFICANCE OF ISOLATING THIS ORGANISM FROM A SINGLE SET OF BLOOD CULTURES WHEN MULTIPLE SETS ARE DRAWN IS UNCERTAIN.  PLEASE NOTIFY THE MICROBIOLOGY DEPARTMENT WITHIN ONE WEEK IF SPECIATION AND SENSITIVITIES ARE REQUIRED. Performed at [facility], [address], [place], [place] [address]    Report Status 04/29/2023 FINAL  Final  Blood Culture ID Panel (Reflexed)     Status: Abnormal   Collection Time: 04/26/23  7:30 AM  Result Value Ref Range Status   Enterococcus faecalis NOT DETECTED NOT DETECTED Final   Enterococcus Faecium NOT DETECTED NOT DETECTED Final   Listeria monocytogenes NOT DETECTED NOT DETECTED Final   Staphylococcus species DETECTED (A) NOT DETECTED Final    Comment: CRITICAL RESULT CALLED TO, READ BACK BY AND VERIFIED WITH: PHARMD J FRANS [id number] AT 958 AM BY CM    Staphylococcus aureus (BCID) NOT DETECTED NOT DETECTED Final   Staphylococcus epidermidis DETECTED (A) NOT DETECTED Final    Comment: Methicillin (oxacillin) resistant coagulase negative staphylococcus. Possible blood culture contaminant (unless isolated from more than one blood culture draw or clinical case suggests pathogenicity). No antibiotic treatment is indicated for blood  culture contaminants. CRITICAL  RESULT CALLED TO, READ BACK BY AND VERIFIED WITH: PHARMD J FRANS [id number] AT 958 AM BY CM    Staphylococcus lugdunensis NOT DETECTED NOT DETECTED Final   Streptococcus species NOT DETECTED NOT DETECTED Final   Streptococcus agalactiae NOT DETECTED NOT DETECTED Final   Streptococcus pneumoniae NOT DETECTED NOT DETECTED Final   Streptococcus pyogenes NOT DETECTED NOT DETECTED Final   A.calcoaceticus-baumannii NOT DETECTED NOT DETECTED Final   Bacteroides fragilis NOT DETECTED NOT DETECTED Final   Enterobacterales NOT DETECTED NOT DETECTED Final   Enterobacter cloacae complex NOT DETECTED NOT DETECTED Final   Escherichia coli NOT DETECTED NOT DETECTED Final   Klebsiella aerogenes NOT DETECTED NOT DETECTED Final   Klebsiella oxytoca NOT DETECTED NOT DETECTED Final   Klebsiella pneumoniae NOT DETECTED NOT DETECTED  Final   Proteus species NOT DETECTED NOT DETECTED Final   Salmonella species NOT DETECTED NOT DETECTED Final   Serratia marcescens NOT DETECTED NOT DETECTED Final   Haemophilus influenzae NOT DETECTED NOT DETECTED Final   Neisseria meningitidis NOT DETECTED NOT DETECTED Final   Pseudomonas aeruginosa NOT DETECTED NOT DETECTED Final   Stenotrophomonas maltophilia NOT DETECTED NOT DETECTED Final   Candida albicans NOT DETECTED NOT DETECTED Final   Candida auris NOT DETECTED NOT DETECTED Final   Candida glabrata NOT DETECTED NOT DETECTED Final   Candida krusei NOT DETECTED NOT DETECTED Final   Candida parapsilosis NOT DETECTED NOT DETECTED Final   Candida tropicalis NOT DETECTED NOT DETECTED Final   Cryptococcus neoformans/gattii NOT DETECTED NOT DETECTED Final   Methicillin resistance mecA/C DETECTED (A) NOT DETECTED Final    Comment: CRITICAL RESULT CALLED TO, READ BACK BY AND VERIFIED WITH: PHARMD J FRANS [id number] AT 958 AM BY CM Performed at [facility], [address], [place], [place] [address]   Blood Culture (routine x 2)     Status: None   Collection Time: 04/26/23  7:52 AM   Specimen: BLOOD  Result Value Ref Range Status   Specimen Description BLOOD LEFT ANTECUBITAL  Final   Special Requests   Final    BOTTLES DRAWN AEROBIC AND ANAEROBIC Blood Culture results may not be optimal due to an inadequate volume of blood received in culture bottles   Culture   Final    NO GROWTH 5 DAYS Performed at [facility], [address], [place], [place] [address]    Report Status 05/01/2023 FINAL  Final  MRSA Next Gen by PCR, Nasal     Status: Abnormal   Collection Time: 04/26/23 12:04 PM   Specimen: Nasal Mucosa; Nasal Swab  Result Value Ref Range Status   MRSA by PCR Next Gen DETECTED (A) NOT DETECTED Final    Comment: (NOTE) The GeneXpert MRSA Assay (FDA approved for NASAL specimens only), is one component of a comprehensive MRSA colonization surveillance program. It is  not intended to diagnose MRSA infection nor to guide or monitor treatment for MRSA infections. Test performance is not FDA approved in patients less than 49 years old. Performed at [facility], [address] [place], [place] [address]   Urine Culture     Status: Abnormal   Collection Time: 04/27/23  9:12 AM   Specimen: Urine, Random  Result Value Ref Range Status   Specimen Description URINE, RANDOM  Final   Special Requests   Final    NONE Performed at [facility], [address] [place], [place] [address]    Culture MULTIPLE SPECIES PRESENT, SUGGEST RECOLLECTION (  A)  Final   Report Status 04/28/2023 FINAL  Final     Labs: BNP (last 3 results) No results for input(s): BNP in the last 8760 hours. Basic Metabolic Panel: Recent Labs  Lab 04/27/23 0418 04/27/23 1936 04/28/23 0710 04/29/23 1408 04/30/23 1144 04/30/23 1607 04/30/23 2004 05/01/23 0038 05/01/23 0423 05/01/23 0708 05/01/23 2158 05/02/23 0047 05/02/23 0437 05/02/23 0851 05/02/23 1300  NA 138  --  134* 133* 128* 129*  --  131* 132*  --   --   --   --   --   --   K 4.4  --  5.9* 5.3* >7.5* 6.5*   < > 7.2* 6.8*   < > 6.9* 5.7* 6.9* 5.6* 5.9*  CL 104  --  101 98 100 99  --  99 101  --   --   --   --   --   --   CO2 28  --  25 27 23 24   --  24 21*  --   --   --   --   --   --   GLUCOSE 89  --  83 150* 119* 163*  --  84 86  --   --   --   --   --   --   BUN 22  --  14 9 11 11   --  11 12  --   --   --   --   --   --   CREATININE 0.96  --  0.86 0.85 1.02 0.90  --  1.04 1.17  --   --   --   --   --   --   CALCIUM  7.8*  --  7.7* 8.0* 8.0* 8.0*  --  8.4* 8.3*  --   --   --   --   --   --   MG 1.1* 2.2 2.1  --   --   --   --   --   --   --   --   --   --   --   --   PHOS 3.6  --   --   --   --   --   --   --   --   --   --   --   --   --   --    < > = values in this interval not displayed.   Liver Function Tests: Recent Labs  Lab 04/26/23 0730 04/29/23 1408 04/30/23 1144  AST 39 25 31  ALT 15  20 18   ALKPHOS 64 55 56  BILITOT 1.2* 0.5 0.6  PROT 5.7* 5.4* 5.1*  ALBUMIN 3.0* 2.5* 2.5*   No results for input(s): LIPASE, AMYLASE in the last 168 hours. No results for input(s): AMMONIA in the last 168 hours. CBC: Recent Labs  Lab 04/26/23 0730 04/26/23 1020 04/27/23 0418 04/29/23 0520 04/30/23 1037  WBC 176.0*  --  115.5* 136.0* 206.5*  NEUTROABS 3.5  --   --   --   --   HGB 9.7* 9.2* 7.7* 8.4* 10.7*  HCT 33.4* 27.0* 25.9* 28.6* 37.2*  MCV 97.7  --  95.9 96.6 97.1  PLT 274  --  193 209 294   Cardiac Enzymes: No results for input(s): CKTOTAL, CKMB, CKMBINDEX, TROPONINI in the last 168 hours. BNP: Invalid input(s): POCBNP CBG: Recent Labs  Lab 05/01/23 2038 05/01/23 2348 05/02/23 0340 05/02/23 0845 05/02/23 1219  GLUCAP 121* 107*  101* 98 103*   D-Dimer No results for input(s): DDIMER in the last 72 hours. Hgb A1c No results for input(s): HGBA1C in the last 72 hours. Lipid Profile No results for input(s): CHOL, HDL, LDLCALC, TRIG, CHOLHDL, LDLDIRECT in the last 72 hours. Thyroid  function studies No results for input(s): TSH, T4TOTAL, T3FREE, THYROIDAB in the last 72 hours.  Invalid input(s): FREET3 Anemia work up No results for input(s): VITAMINB12, FOLATE, FERRITIN, TIBC, IRON, RETICCTPCT in the last 72 hours. Urinalysis    Component Value Date/Time   COLORURINE YELLOW 04/26/2023 1204   APPEARANCEUR CLOUDY (A) 04/26/2023 1204   LABSPEC 1.033 (H) 04/26/2023 1204   PHURINE 5.0 04/26/2023 1204   GLUCOSEU NEGATIVE 04/26/2023 1204   HGBUR SMALL (A) 04/26/2023 1204   BILIRUBINUR NEGATIVE 04/26/2023 1204   KETONESUR 5 (A) 04/26/2023 1204   PROTEINUR NEGATIVE 04/26/2023 1204   NITRITE NEGATIVE 04/26/2023 1204   LEUKOCYTESUR LARGE (A) 04/26/2023 1204   Sepsis Labs Recent Labs  Lab 04/26/23 0730 04/27/23 0418 04/29/23 0520 04/30/23 1037  WBC 176.0* 115.5* 136.0* 206.5*   Microbiology Recent  Results (from the past 240 hours)  Resp panel by RT-PCR (RSV, Flu A&B, Covid) Anterior Nasal Swab     Status: None   Collection Time: 04/26/23  7:18 AM   Specimen: Anterior Nasal Swab  Result Value Ref Range Status   SARS Coronavirus 2 by RT PCR NEGATIVE NEGATIVE Final   Influenza A by PCR NEGATIVE NEGATIVE Final   Influenza B by PCR NEGATIVE NEGATIVE Final    Comment: (NOTE) The Xpert Xpress SARS-CoV-2/FLU/RSV plus assay is intended as an aid in the diagnosis of influenza from Nasopharyngeal swab specimens and should not be used as a sole basis for treatment. Nasal washings and aspirates are unacceptable for Xpert Xpress SARS-CoV-2/FLU/RSV testing.  Fact Sheet for Patients: bloggercourse.com  Fact Sheet for Healthcare Providers: seriousbroker.it  This test is not yet approved or cleared by the [place]  FDA and has been authorized for detection and/or diagnosis of SARS-CoV-2 by FDA under an Emergency Use Authorization (EUA). This EUA will remain in effect (meaning this test can be used) for the duration of the COVID-19 declaration under Section 564(b)(1) of the Act, 21 U.S.C. section 360bbb-3(b)(1), unless the authorization is terminated or revoked.     Resp Syncytial Virus by PCR NEGATIVE NEGATIVE Final    Comment: (NOTE) Fact Sheet for Patients: bloggercourse.com  Fact Sheet for Healthcare Providers: seriousbroker.it  This test is not yet approved or cleared by the [place]  FDA and has been authorized for detection and/or diagnosis of SARS-CoV-2 by FDA under an Emergency Use Authorization (EUA). This EUA will remain in effect (meaning this test can be used) for the duration of the COVID-19 declaration under Section 564(b)(1) of the Act, 21 U.S.C. section 360bbb-3(b)(1), unless the authorization is terminated or revoked.  Performed at [facility], [address], [place], [place] [address]   Blood Culture (routine x 2)     Status: Abnormal   Collection Time: 04/26/23  7:30 AM   Specimen: BLOOD  Result Value Ref Range Status   Specimen Description BLOOD PORTA CATH  Final   Special Requests   Final    BOTTLES DRAWN AEROBIC AND ANAEROBIC Blood Culture results may not be optimal due to an inadequate volume of blood received in culture bottles   Culture  Setup Time   Final    GRAM POSITIVE COCCI IN BOTH AEROBIC AND ANAEROBIC BOTTLES CRITICAL RESULT CALLED TO, READ  BACK BY AND VERIFIED WITH: PHARMD J FRANS [id number] AT 958 AM BY CM    Culture (A)  Final    STAPHYLOCOCCUS EPIDERMIDIS THE SIGNIFICANCE OF ISOLATING THIS ORGANISM FROM A SINGLE SET OF BLOOD CULTURES WHEN MULTIPLE SETS ARE DRAWN IS UNCERTAIN. PLEASE NOTIFY THE MICROBIOLOGY DEPARTMENT WITHIN ONE WEEK IF SPECIATION AND SENSITIVITIES ARE REQUIRED. Performed at [facility], [address] [place], [place] [address]    Report Status 04/29/2023 FINAL  Final  Blood Culture ID Panel (Reflexed)     Status: Abnormal   Collection Time: 04/26/23  7:30 AM  Result Value Ref Range Status   Enterococcus faecalis NOT DETECTED NOT DETECTED Final   Enterococcus Faecium NOT DETECTED NOT DETECTED Final   Listeria monocytogenes NOT DETECTED NOT DETECTED Final   Staphylococcus species DETECTED (A) NOT DETECTED Final    Comment: CRITICAL RESULT CALLED TO, READ BACK BY AND VERIFIED WITH: PHARMD J FRANS [id number] AT 958 AM BY CM    Staphylococcus aureus (BCID) NOT DETECTED NOT DETECTED Final   Staphylococcus epidermidis DETECTED (A) NOT DETECTED Final    Comment: Methicillin (oxacillin) resistant coagulase negative staphylococcus. Possible blood culture contaminant (unless isolated from more than one blood culture draw or clinical case suggests pathogenicity). No antibiotic treatment is indicated for blood  culture contaminants. CRITICAL RESULT CALLED TO, READ BACK BY AND VERIFIED WITH: PHARMD J FRANS  [phone] AT 958 AM BY CM    Staphylococcus lugdunensis NOT DETECTED NOT DETECTED Final   Streptococcus species NOT DETECTED NOT DETECTED Final   Streptococcus agalactiae NOT DETECTED NOT DETECTED Final   Streptococcus pneumoniae NOT DETECTED NOT DETECTED Final   Streptococcus pyogenes NOT DETECTED NOT DETECTED Final   A.calcoaceticus-baumannii NOT DETECTED NOT DETECTED Final   Bacteroides fragilis NOT DETECTED NOT DETECTED Final   Enterobacterales NOT DETECTED NOT DETECTED Final   Enterobacter cloacae complex NOT DETECTED NOT DETECTED Final   Escherichia coli NOT DETECTED NOT DETECTED Final   Klebsiella aerogenes NOT DETECTED NOT DETECTED Final   Klebsiella oxytoca NOT DETECTED NOT DETECTED Final   Klebsiella pneumoniae NOT DETECTED NOT DETECTED Final   Proteus species NOT DETECTED NOT DETECTED Final   Salmonella species NOT DETECTED NOT DETECTED Final   Serratia marcescens NOT DETECTED NOT DETECTED Final   Haemophilus influenzae NOT DETECTED NOT DETECTED Final   Neisseria meningitidis NOT DETECTED NOT DETECTED Final   Pseudomonas aeruginosa NOT DETECTED NOT DETECTED Final   Stenotrophomonas maltophilia NOT DETECTED NOT DETECTED Final   Candida albicans NOT DETECTED NOT DETECTED Final   Candida auris NOT DETECTED NOT DETECTED Final   Candida glabrata NOT DETECTED NOT DETECTED Final   Candida krusei NOT DETECTED NOT DETECTED Final   Candida parapsilosis NOT DETECTED NOT DETECTED Final   Candida tropicalis NOT DETECTED NOT DETECTED Final   Cryptococcus neoformans/gattii NOT DETECTED NOT DETECTED Final   Methicillin resistance mecA/C DETECTED (A) NOT DETECTED Final    Comment: CRITICAL RESULT CALLED TO, READ BACK BY AND VERIFIED WITH: PHARMD J FRANS [phone] AT 958 AM BY CM Performed at [facility], [address], [place], [place] [address]   Blood Culture (routine x 2)     Status: None   Collection Time: 04/26/23  7:52 AM   Specimen: BLOOD  Result Value Ref Range Status    Specimen Description BLOOD LEFT ANTECUBITAL  Final   Special Requests   Final    BOTTLES DRAWN AEROBIC AND ANAEROBIC Blood Culture results may not be optimal due to an  inadequate volume of blood received in culture bottles   Culture   Final    NO GROWTH 5 DAYS Performed at [facility], [address] [place], [place] [address]    Report Status 05/01/2023 FINAL  Final  MRSA Next Gen by PCR, Nasal     Status: Abnormal   Collection Time: 04/26/23 12:04 PM   Specimen: Nasal Mucosa; Nasal Swab  Result Value Ref Range Status   MRSA by PCR Next Gen DETECTED (A) NOT DETECTED Final    Comment: (NOTE) The GeneXpert MRSA Assay (FDA approved for NASAL specimens only), is one component of a comprehensive MRSA colonization surveillance program. It is not intended to diagnose MRSA infection nor to guide or monitor treatment for MRSA infections. Test performance is not FDA approved in patients less than 80 years old. Performed at [facility], [address] [place], [place] [address]   Urine Culture     Status: Abnormal   Collection Time: 04/27/23  9:12 AM   Specimen: Urine, Random  Result Value Ref Range Status   Specimen Description URINE, RANDOM  Final   Special Requests   Final    NONE Performed at [facility], [address] [place], [place] [address]    Culture MULTIPLE SPECIES PRESENT, SUGGEST RECOLLECTION (A)  Final   Report Status 04/28/2023 FINAL  Final     Time coordinating discharge: 38 minutes  SIGNED:   Almarie KANDICE Hoots, MD  [facility] 05/02/2023, 3:39 PM

## 2023-05-02 NOTE — Progress Notes (Signed)
 Occupational Therapy Treatment Patient Details Name: Fernando Young MRN: [id number] DOB: January 22, 1941 Today's Date: 05/02/2023   History of present illness 82 yo man admitted 12/25 with here with dyspnea, fevers/rigors, and worsening diarrhea, abdominal pain. Found to be hypotensive.  Pt admitted with septic shock due to PNE.  Pt with palliative consult - noting plan to return home with palliative care and HH, with idea that if not thriving could transition to hospice.   PMHx: CLL, chronic diarrhea, atrial fibrillation, heart failure, severe protein calorie malnutrition and repeated hospitalizations over the past few weeks.   OT comments  Patient frustrated by frequent BM.  Able to stand with supervision/CGA at RW level to be cleaned.  Patient wanting to remain in the recliner to eat his peaches.  Patient completed seated grooming with setup, red foam handles placed on spoon to increase control and improve independence with self feeding.  OT can continue efforts in the acute setting to address deficits, and HH OT can be considered if family can provide the needed 24 hour Min A.       If plan is discharge home, recommend the following:  A little help with walking and/or transfers;A little help with bathing/dressing/bathroom;Assistance with cooking/housework;Direct supervision/assist for medications management;Assist for transportation;Help with stairs or ramp for entrance;Direct supervision/assist for financial management   Equipment Recommendations  None recommended by OT    Recommendations for Other Services      Precautions / Restrictions Precautions Precautions: Fall Restrictions Weight Bearing Restrictions Per Provider Order: No       Mobility Bed Mobility               General bed mobility comments: Up in the recliner    Transfers Overall transfer level: Needs assistance Equipment used: Rolling walker (2 wheels) Transfers: Sit to/from Stand Sit to Stand: Min assist                  Balance   Sitting-balance support: No upper extremity supported, Feet supported Sitting balance-Leahy Scale: Good     Standing balance support: Reliant on assistive device for balance Standing balance-Leahy Scale: Poor                             ADL either performed or assessed with clinical judgement   ADL                       Lower Body Dressing: Moderate assistance;Sit to/from stand   Toilet Transfer: Minimal assistance;Stand-pivot;BSC/3in1                  Extremity/Trunk Assessment Upper Extremity Assessment Upper Extremity Assessment: Generalized weakness RUE Coordination: decreased fine motor LUE Coordination: decreased fine motor   Lower Extremity Assessment Lower Extremity Assessment: Defer to PT evaluation        Vision Patient Visual Report: No change from baseline     Perception Perception Perception: Not tested   Praxis Praxis Praxis: Not tested    Cognition Arousal: Alert Behavior During Therapy: Flat affect, Agitated Overall Cognitive Status: Within Functional Limits for tasks assessed  Pertinent Vitals/ Pain       Pain Assessment Pain Assessment: Faces Faces Pain Scale: Hurts a little bit Pain Location: Buttock Pain Descriptors / Indicators: Aching Pain Intervention(s): Monitored during session  ````````````````````````````````````````````````````````````````````````````````````````````````````````````````````````````````````````````````````````````````````````````````````````````````````````````````````````````````````````````                                                        Frequency  Min 1X/week        Progress Toward Goals  OT Goals(current goals can now be found in the care plan section)  Progress towards OT goals: Progressing toward goals  Acute Rehab OT Goals OT  Goal Formulation: With patient Time For Goal Achievement: 05/11/23 Potential to Achieve Goals: Fair  Plan      Co-evaluation                 AM-PAC OT 6 Clicks Daily Activity     Outcome Measure   Help from another person eating meals?: A Little Help from another person taking care of personal grooming?: A Little Help from another person toileting, which includes using toliet, bedpan, or urinal?: A Lot Help from another person bathing (including washing, rinsing, drying)?: A Lot Help from another person to put on and taking off regular upper body clothing?: A Little Help from another person to put on and taking off regular lower body clothing?: A Lot 6 Click Score: 15    End of Session Equipment Utilized During Treatment: Rolling walker (2 wheels)  OT Visit Diagnosis: Unsteadiness on feet (R26.81);Other abnormalities of gait and mobility (R26.89);Muscle weakness (generalized) (M62.81)   Activity Tolerance Patient limited by fatigue   Patient Left in chair;with call bell/phone within reach;with chair alarm set   Nurse Communication Mobility status        Time: 8572-8554 OT Time Calculation (min): 18 min  Charges: OT General Charges $OT Visit: 1 Visit OT Treatments $Self Care/Home Management : 8-22 mins  05/02/2023  RP, OTR/L  Acute Rehabilitation Services  Office:  [phone]   Charlie JONETTA Halsted 05/02/2023, 2:49 PM

## 2023-05-02 NOTE — Progress Notes (Signed)
   Palliative Medicine Inpatient Follow Up Note HPI: 82 year old male with history of CLL, hypertension, iron deficiency anemia, diarrhea, paroxysmal atrial fibrillation, combined diastolic systolic heart failure, hypothyroidism, failure to thrive, malnutrition, recent admission for septic shock readmitted with multilobar pneumonia.    Palliative care asked to get involved to discuss goals of care in the setting of acute illness.  Today's Discussion 05/02/2023  *Please note that this is a verbal dictation therefore any spelling or grammatical errors are due to the Dragon Medical One system interpretation.  Chart reviewed inclusive of vital signs, progress notes, laboratory results, and diagnostic images. K remains elevated.  I met with Jahseh this afternoon, discussed the plan for transition home. Denies pain, shortness of breath, nausea.   I spoke to patients daughter, Zebedee twice this afternoon. Discussed his elevated potassium and the medications which will be used at home per the primary team. Patients daughter requests ambulance transfer. I shared that I would follow up with the [facility] team.   Plan for transition to home with [facility] home health and Palliative services.   Questions and concerns addressed/Palliative Support Provided.   Objective Assessment: Vital Signs Vitals:   05/02/23 0343 05/02/23 0754  BP: 103/76 111/71  Pulse: 80 74  Resp: 17 17  Temp: 97.9 F (36.6 C) 98.3 F (36.8 C)  SpO2: 96% 96%    Intake/Output Summary (Last 24 hours) at 05/02/2023 1524 Last data filed at 05/02/2023 0800 Gross per 24 hour  Intake 303 ml  Output --  Net 303 ml   Last Weight  Most recent update: 05/02/2023  6:38 AM    Weight  65.1 kg (143 lb 8.3 oz)            Gen: Elderly Caucasian male chronically ill-appearing HEENT: moist mucous membranes CV: Regular rate and irregular rhythm PULM: On room air breathing is even and nonlabored ABD: soft/nontender EXT: Decreased  muscular tone in all 4 extremities , (-) edema Neuro: Alert and oriented x3 hard of hearing  SUMMARY OF RECOMMENDATIONS   DNAR/DNI   Plan for transition to home today with [facility]  Patients daughter requests ambulance transfer - MSW aware  Billing based on MDM: Low ______________________________________________________________________________________ Rosaline Becton [place] Palliative Medicine Team Team Cell Phone: [phone] Please utilize secure chat with additional questions, if there is no response within 30 minutes please call the above phone number  Palliative Medicine Team providers are available by phone from 7am to 7pm daily and can be reached through the team cell phone.  Should this patient require assistance outside of these hours, please call the patient's attending physician.

## 2023-05-03 LAB — GLUCOSE, CAPILLARY: Glucose-Capillary: 137 mg/dL — ABNORMAL HIGH (ref 70–99)

## 2023-05-12 ENCOUNTER — Inpatient Hospital Stay: Payer: Medicare Other

## 2023-05-12 ENCOUNTER — Inpatient Hospital Stay: Payer: Medicare Other | Admitting: Oncology

## 2023-06-03 DEATH — deceased

## 2023-07-07 IMAGING — DX DG ABDOMEN 1V
1 series · 1 of 1 positions shown · non-contrast
Comparison: None

CLINICAL DATA: Evaluate for small bowel obstruction.

EXAM:
ABDOMEN - 1 VIEW

[abdomen kub]
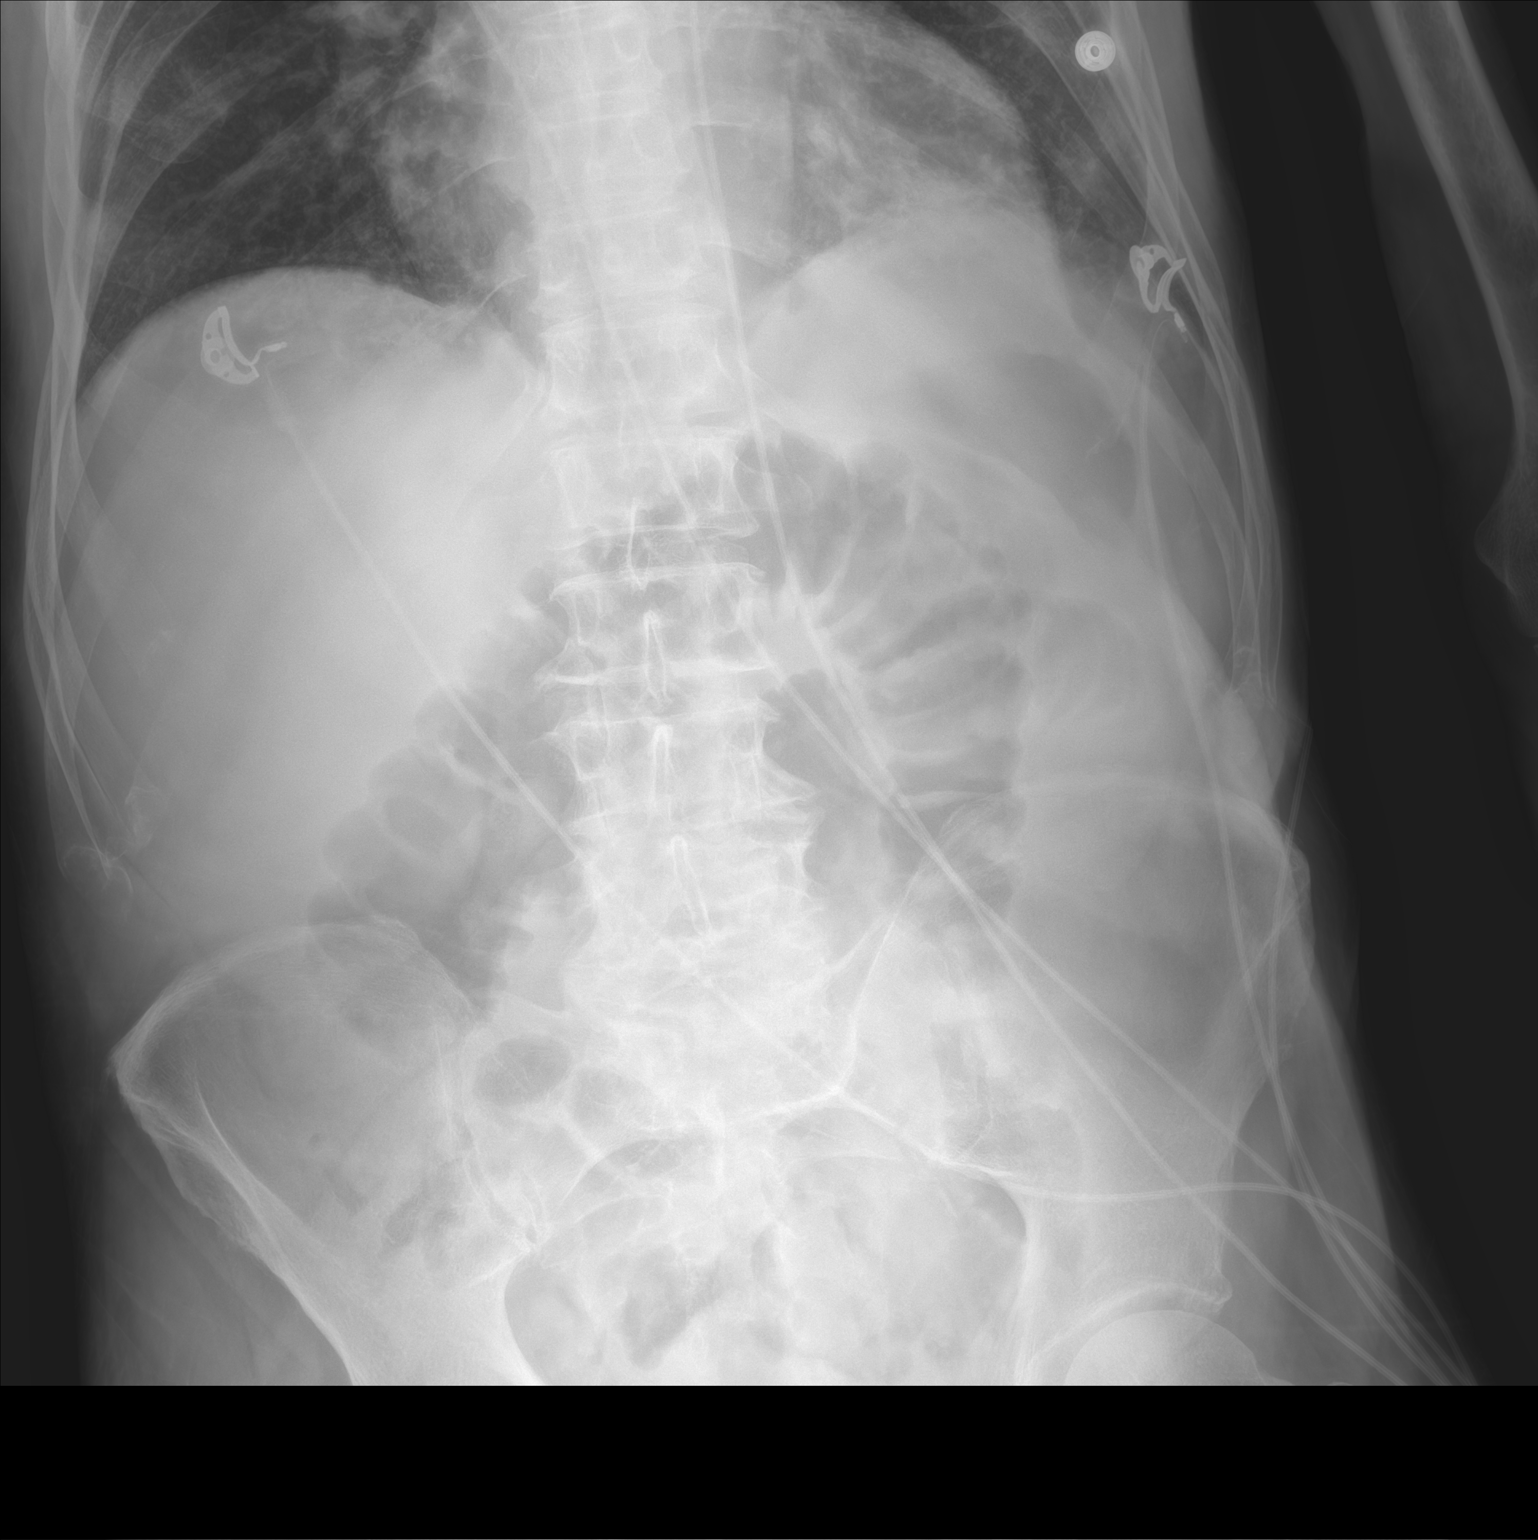

[1 of 1 positions shown; findings below may reference images not displayed]

FINDINGS: There is a single dilated loop of small bowel within the central
abdomen measuring up to 4.5 cm. Decreased colonic gas noted. No
abnormal abdominal or pelvic calcifications.
IMPRESSION: Single dilated loop of small bowel within the central abdomen
compatible with either small bowel obstruction or ileus.
